# Patient Record
Sex: Female | Born: 1940
Health system: Southern US, Community
[De-identification: ages and names within clinical notes are randomized; demographics above are authoritative.]

## PROBLEM LIST (undated history)

## (undated) DIAGNOSIS — I2510 Atherosclerotic heart disease of native coronary artery without angina pectoris: Secondary | ICD-10-CM

## (undated) DIAGNOSIS — F329 Major depressive disorder, single episode, unspecified: Secondary | ICD-10-CM

## (undated) DIAGNOSIS — F32A Depression, unspecified: Secondary | ICD-10-CM

## (undated) DIAGNOSIS — H532 Diplopia: Secondary | ICD-10-CM

## (undated) DIAGNOSIS — K219 Gastro-esophageal reflux disease without esophagitis: Secondary | ICD-10-CM

## (undated) DIAGNOSIS — K200 Eosinophilic esophagitis: Secondary | ICD-10-CM

## (undated) DIAGNOSIS — N189 Chronic kidney disease, unspecified: Secondary | ICD-10-CM

## (undated) DIAGNOSIS — I639 Cerebral infarction, unspecified: Secondary | ICD-10-CM

## (undated) DIAGNOSIS — M1990 Unspecified osteoarthritis, unspecified site: Secondary | ICD-10-CM

## (undated) DIAGNOSIS — Z8619 Personal history of other infectious and parasitic diseases: Secondary | ICD-10-CM

## (undated) DIAGNOSIS — H9209 Otalgia, unspecified ear: Secondary | ICD-10-CM

## (undated) DIAGNOSIS — R32 Unspecified urinary incontinence: Secondary | ICD-10-CM

## (undated) DIAGNOSIS — H348112 Central retinal vein occlusion, right eye, stable: Secondary | ICD-10-CM

## (undated) DIAGNOSIS — E785 Hyperlipidemia, unspecified: Secondary | ICD-10-CM

## (undated) DIAGNOSIS — I10 Essential (primary) hypertension: Secondary | ICD-10-CM

## (undated) HISTORY — PX: ESOPHAGOGASTRODUODENOSCOPY: SHX1529

## (undated) HISTORY — DX: Depression, unspecified: F32.A

## (undated) HISTORY — DX: Major depressive disorder, single episode, unspecified: F32.9

## (undated) HISTORY — DX: Personal history of other infectious and parasitic diseases: Z86.19

## (undated) HISTORY — PX: OTHER SURGICAL HISTORY: SHX169

## (undated) HISTORY — DX: Otalgia, unspecified ear: H92.09

## (undated) HISTORY — PX: CAROTID STENT: SHX1301

## (undated) HISTORY — PX: ABDOMINAL HYSTERECTOMY: SHX81

## (undated) HISTORY — DX: Cerebral infarction, unspecified: I63.9

---

## 1999-04-23 DIAGNOSIS — I639 Cerebral infarction, unspecified: Secondary | ICD-10-CM

## 1999-04-23 HISTORY — DX: Cerebral infarction, unspecified: I63.9

## 2012-03-13 ENCOUNTER — Encounter (INDEPENDENT_AMBULATORY_CARE_PROVIDER_SITE_OTHER): Payer: Medicare Other | Admitting: Ophthalmology

## 2012-03-13 DIAGNOSIS — I10 Essential (primary) hypertension: Secondary | ICD-10-CM

## 2012-03-13 DIAGNOSIS — H35039 Hypertensive retinopathy, unspecified eye: Secondary | ICD-10-CM

## 2012-03-13 DIAGNOSIS — H348192 Central retinal vein occlusion, unspecified eye, stable: Secondary | ICD-10-CM

## 2012-03-13 DIAGNOSIS — H43819 Vitreous degeneration, unspecified eye: Secondary | ICD-10-CM

## 2012-04-07 ENCOUNTER — Ambulatory Visit (HOSPITAL_BASED_OUTPATIENT_CLINIC_OR_DEPARTMENT_OTHER): Payer: Medicare Other | Attending: Nurse Practitioner | Admitting: Radiology

## 2012-04-07 VITALS — Ht 67.0 in | Wt 168.0 lb

## 2012-04-07 DIAGNOSIS — G4710 Hypersomnia, unspecified: Secondary | ICD-10-CM | POA: Insufficient documentation

## 2012-04-07 DIAGNOSIS — G4733 Obstructive sleep apnea (adult) (pediatric): Secondary | ICD-10-CM

## 2012-04-11 DIAGNOSIS — G4710 Hypersomnia, unspecified: Secondary | ICD-10-CM

## 2012-04-11 DIAGNOSIS — G4730 Sleep apnea, unspecified: Secondary | ICD-10-CM

## 2012-04-11 NOTE — Procedures (Signed)
NAME:  ABBAGAIL, SCAFF                  ACCOUNT NO.:  [id number]  MEDICAL RECORD NO.:  [id number]          PATIENT TYPE:  OUT  LOCATION:  SLEEP CENTER                 FACILITY:  [facility]  PHYSICIAN:  Clinton D. Maple Hudson, MD, FCCP, FACPDATE OF BIRTH:  11-Dec[birth year]1938  DATE OF STUDY:  04/07/2012                           NOCTURNAL POLYSOMNOGRAM  REFERRING PHYSICIAN:  HAHN-KETTER  REFERRING PHYSICIAN:  Cathlyn Parsons, NP  INDICATION FOR STUDY:  Hypersomnia with sleep apnea.  EPWORTH SLEEPINESS SCORE:  10/24.  BMI 26.3, weight 168 pounds, height 67 inches, neck 14 inches.  MEDICATIONS:  Home medications are charted and reviewed.  A baseline diagnostic NPSG was done elsewhere on January 05, 2012, recording an AHI of 9.5 per hour.  Body weight was 169 pounds for that study.  CPAP titration is now requested.  SLEEP ARCHITECTURE:  Total sleep time 308 minutes with sleep efficiency 64.7%.  Stage I was 2.3%, stage II 95.3%, stage III 0.8%, REM 1.6% of total sleep time.  Sleep latency 165 minutes.  REM latency 253.5 minutes, awake after sleep onset 4 minutes.  Arousal index 24.2. Bedtime medication:  None.  RESPIRATORY DATA:  CPAP titration protocol.  CPAP was titrated to 7 CWP, AHI 40 per hour, reflecting obstructive and central events.  Because of frequent central apneas, further titration was with bilevel to a final inspiratory pressure of 18 and expiratory pressure of 14 CWP, AHI 5.3 per hour.  She wore a small ResMed Quattro FX full-face mask with heated humidifier.  OXYGEN DATA:  Snoring was prevented by CPAP/BIPAP and mean oxygen saturation held 95.5% on room air.  CARDIAC DATA:  Normal sinus rhythm.  MOVEMENT/PARASOMNIA:  No significant movement disturbance.  Bathroom x1.  IMPRESSION/RECOMMENDATION: 1. CPAP provided inadequate control of central events and was changed     to bilevel (BiPAP) with final titration to inspiratory 18 CWP and     expiratory 14 CWP.  This provided AHI 5.3  per hour and prevented     snoring.  She wore a small ResMed Quattro FX full-face mask with     heated humidifier. 2. Baseline NPSG on January 05, 2012 elsewhere had recorded AHI 9.5     per hour.  Body weight was 169 pounds for that study.     Clinton D. Maple Hudson, MD, [facility], FACP Diplomate, American Board of Sleep Medicine    CDY/MEDQ  D:  04/11/2012 12:18:22  T:  04/11/2012 16:10:96  Job:  [id number]

## 2012-04-24 ENCOUNTER — Encounter (INDEPENDENT_AMBULATORY_CARE_PROVIDER_SITE_OTHER): Payer: Medicare PPO | Admitting: Ophthalmology

## 2012-04-24 DIAGNOSIS — H43819 Vitreous degeneration, unspecified eye: Secondary | ICD-10-CM

## 2012-04-24 DIAGNOSIS — I10 Essential (primary) hypertension: Secondary | ICD-10-CM

## 2012-04-24 DIAGNOSIS — H35039 Hypertensive retinopathy, unspecified eye: Secondary | ICD-10-CM

## 2012-04-24 DIAGNOSIS — H348192 Central retinal vein occlusion, unspecified eye, stable: Secondary | ICD-10-CM

## 2012-05-12 ENCOUNTER — Encounter: Payer: Self-pay | Admitting: Physical Medicine & Rehabilitation

## 2012-05-22 ENCOUNTER — Encounter (INDEPENDENT_AMBULATORY_CARE_PROVIDER_SITE_OTHER): Payer: Medicare PPO | Admitting: Ophthalmology

## 2012-05-22 DIAGNOSIS — I10 Essential (primary) hypertension: Secondary | ICD-10-CM

## 2012-05-22 DIAGNOSIS — H348192 Central retinal vein occlusion, unspecified eye, stable: Secondary | ICD-10-CM

## 2012-05-22 DIAGNOSIS — H43819 Vitreous degeneration, unspecified eye: Secondary | ICD-10-CM

## 2012-05-22 DIAGNOSIS — H35039 Hypertensive retinopathy, unspecified eye: Secondary | ICD-10-CM

## 2012-06-01 ENCOUNTER — Encounter: Payer: Medicare PPO | Attending: Physical Medicine & Rehabilitation | Admitting: Physical Medicine & Rehabilitation

## 2012-06-01 ENCOUNTER — Encounter: Payer: Self-pay | Admitting: Physical Medicine & Rehabilitation

## 2012-06-01 VITALS — BP 88/46 | HR 56 | Resp 14 | Ht 66.0 in | Wt 173.0 lb

## 2012-06-01 DIAGNOSIS — G4700 Insomnia, unspecified: Secondary | ICD-10-CM | POA: Insufficient documentation

## 2012-06-01 DIAGNOSIS — I69320 Aphasia following cerebral infarction: Secondary | ICD-10-CM

## 2012-06-01 DIAGNOSIS — M25569 Pain in unspecified knee: Secondary | ICD-10-CM | POA: Insufficient documentation

## 2012-06-01 DIAGNOSIS — K5900 Constipation, unspecified: Secondary | ICD-10-CM | POA: Insufficient documentation

## 2012-06-01 DIAGNOSIS — I6789 Other cerebrovascular disease: Secondary | ICD-10-CM

## 2012-06-01 DIAGNOSIS — IMO0002 Reserved for concepts with insufficient information to code with codable children: Secondary | ICD-10-CM

## 2012-06-01 DIAGNOSIS — M545 Low back pain, unspecified: Secondary | ICD-10-CM | POA: Insufficient documentation

## 2012-06-01 DIAGNOSIS — I69959 Hemiplegia and hemiparesis following unspecified cerebrovascular disease affecting unspecified side: Secondary | ICD-10-CM | POA: Insufficient documentation

## 2012-06-01 DIAGNOSIS — G8929 Other chronic pain: Secondary | ICD-10-CM | POA: Insufficient documentation

## 2012-06-01 DIAGNOSIS — I6330 Cerebral infarction due to thrombosis of unspecified cerebral artery: Secondary | ICD-10-CM | POA: Insufficient documentation

## 2012-06-01 DIAGNOSIS — M1711 Unilateral primary osteoarthritis, right knee: Secondary | ICD-10-CM

## 2012-06-01 DIAGNOSIS — M24419 Recurrent dislocation, unspecified shoulder: Secondary | ICD-10-CM | POA: Insufficient documentation

## 2012-06-01 DIAGNOSIS — I10 Essential (primary) hypertension: Secondary | ICD-10-CM | POA: Insufficient documentation

## 2012-06-01 DIAGNOSIS — G4733 Obstructive sleep apnea (adult) (pediatric): Secondary | ICD-10-CM | POA: Insufficient documentation

## 2012-06-01 DIAGNOSIS — M1710 Unilateral primary osteoarthritis, unspecified knee: Secondary | ICD-10-CM

## 2012-06-01 DIAGNOSIS — M7500 Adhesive capsulitis of unspecified shoulder: Secondary | ICD-10-CM | POA: Insufficient documentation

## 2012-06-01 DIAGNOSIS — I69920 Aphasia following unspecified cerebrovascular disease: Secondary | ICD-10-CM | POA: Insufficient documentation

## 2012-06-01 DIAGNOSIS — M25519 Pain in unspecified shoulder: Secondary | ICD-10-CM | POA: Insufficient documentation

## 2012-06-01 DIAGNOSIS — Z5181 Encounter for therapeutic drug level monitoring: Secondary | ICD-10-CM

## 2012-06-01 DIAGNOSIS — G8110 Spastic hemiplegia affecting unspecified side: Secondary | ICD-10-CM

## 2012-06-01 MED ORDER — OXYCODONE HCL ER 30 MG PO T12A: 30.0000 mg | EXTENDED_RELEASE_TABLET | Freq: Two times a day (BID) | ORAL | Status: DC

## 2012-06-01 NOTE — Progress Notes (Addendum)
Subjective:    Patient ID: Kristin [name], female    DOB: 12/23/1940, 72 y.o.   MRN: [id number]  HPI  This is an initial visit for Kristin [name] today. She is a pleasant 72 WF who suffered a left MCA stroke in 2001. She was referred to this clinic by her son who is a Programmer, multimedia. She recently moved to the [place] area after having spent her entire life in the [place] area.   She reports increased pain in her right knee over the last several years since the stroke. She was seen at [facility] in [place]. She was place on lyrica and oxycodone which helped to an extent. She is currently on oxycontin 41m q12. Family doesn't recall that xrays were performed recently. She has a right AFO which is approximately 74 years old. Lyrica is on board 150mg  TID for neuropathic pain in the right leg. She has had threrapy remotely. Apparently botox injections were performed at some point.   Kristin [name] also complains of low back pain when she sits for longer periods of time or , to a lesser extent, when she walks longer distances. She uses a 3 wheeled walker for balance. Kristin [name]/Kristin [name] report that she and her Kristin [name] have been fairly active up until recently with exercise and activities outside the home. They had even been involved in water exercise/aerobics until the fall.  She reports some intermittent pain in the right shoulder which tends to bother her the longer she's up walking or sitting. She does not wear a splint.   Constipation is another family mentioned.  She sometimes goes a few days between movements. Her stool is often hard. She is only using laxatives on a PRN basis because she doesn't want to add to her med list. She does drink a lot of water, tries to stay active etc.   She and her Kristin [name] are in [place] at [employer].   Kristin [name] reports that she often wakes up in the middle of the night. Usually it is her bladder which awakens her. Sometimes he awakens her when he's up. He reports that on the whole she  has been more anxious since her stroke, and he feels that it's her anxiety which prevents her from falling back asleep.     Pain Inventory Average Pain 3 Pain Right Now 5 My pain is intermittent, dull, stabbing and aching  In the last 24 hours, has pain interfered with the following? General activity 4 Relation with others 1 Enjoyment of life 3 What TIME of day is your pain at its worst? evening Sleep (in general) Poor  Pain is worse with: walking, sitting, standing and exercise Pain improves with: rest and medication Relief from Meds: 6  Mobility use a walker ability to climb steps?  no do you drive?  no transfers alone  Function disabled: date disabled 04/1999 I need assistance with the following:  meal prep, household duties and shopping  Neuro/Psych weakness trouble walking dizziness confusion depression  Prior Studies x-rays CT/MRI  Physicians involved in your care Primary care [facility]   History reviewed. No pertinent family history. History   Social History  . Marital Status: Married    Spouse Name: N/A    Number of Children: N/A  . Years of Education: N/A   Social History Main Topics  . Smoking status: Never Smoker   . Smokeless tobacco: None  . Alcohol Use: None  . Drug Use: None  . Sexually Active: None  Other Topics Concern  . None   Social History Narrative  . None   Past Surgical History  Procedure Laterality Date  . Abdominal hysterectomy    . Carotid stent    . Lasix     Past Medical History  Diagnosis Date  . Stroke   . Hypertension   . Heart block     Stent   BP 88/46  Pulse 56  Resp 14  Ht 5\' 6"  (1.676 m)  Wt 173 lb (78.472 kg)  BMI 27.94 kg/m2  SpO2 95%     Review of Systems  Constitutional: Positive for unexpected weight change.  Cardiovascular: Positive for leg swelling.  Gastrointestinal: Positive for nausea and constipation.  Musculoskeletal: Positive  for back pain and gait problem.  Neurological: Positive for weakness.  Hematological: Bruises/bleeds easily.  Psychiatric/Behavioral: Positive for confusion and dysphoric mood.  All other systems reviewed and are negative.       Objective:   Physical Exam  General: Alert , No apparent distress HEENT: Head is normocephalic, atraumatic, PERRLA, EOMI, sclera anicteric, oral mucosa pink and moist, dentition intact, ext ear canals clear,  Neck: Supple without JVD or lymphadenopathy Heart: Reg rate and rhythm. No murmurs rubs or gallops Chest: CTA bilaterally without wheezes, rales, or rhonchi; no distress Abdomen: Soft, non-tender, non-distended, bowel sounds positive. Extremities: No clubbing, cyanosis, or edema. Pulses are 2+ Skin: Clean and intact without signs of breakdown Neuro: Pt with diplopia when testing EOM but no gross asymmetry was seen. She has no other obvious CN deficits. She has expressive greater than receptive aphasia, occasionally uttering an appropriate "yes" or "no" between repetitive sound she tends to make. She also can occasionally speak in short sentences when she tries to sing. RUE strength is grossly 2 to 2+ with the hand limited more by spasticity. RLE is 3-4 with HF, KE, 2 with ADF, APF. She has a flexor spasticity pattern in the RUE with Ashworth scores: 1+ pecs, trace biceps, 3 WF and FF. 1-2 right foot PF and Inverters.DTR's on the right are 3+. Clonus of 2 beats seen at the ankle. She does have diminished pain sense on the right side but IS able to sense pain. She ambulated for me and tends to circumduct and laterally rotate the right leg for clearance. Her heel does not sit completely into the bottom of the AFO Musculoskeletal: she has lateral joint line pain at the right knee with palpation. Mild pain there as well with McMurray's testing. Minimal pain with valgus and varus stress. No instability was seen. Minimal swelling noted around the knee. Low back was mildy  tender to palpaiton. Right shoulder was tight abduction and IR/ER. RTC manuevers were equivocal. She has a 1/2 sublux of the humeral head.  Psych: Pt's affect is appropriate. Pt is cooperative        Assessment & Plan:  1. Hx of left MCA infarct with spastic right hemiparesis, expressive greater than receptive aphasia 2. Chronic right knee pain which appears osteoarthritic and is related to her gait pattern. 3. Chronic low back pain, again, likely related to gait pattern. 4. Insomnia, anxiety 5. Constipation 6. Right shoulder pain related to chronic subluxation, mild adhesive capsulitis, chronic spasticity 7. OSA.   Plan: 1. Refilled her oxycontin 30mg  q12 and a CSA was signed. My goal would be to wean and eventually discontinue this medication. The patient would prefer this. 2. Will set her up for botox injections to the right gastrocs, tibialis posterior, and right wrist  and finger flexors with a total of 300u botox to start---will follow these up with outpt PT and OT to work on ROM, posture, functional use of the affected limbs, and technique with gait. I also think she would do better with a hemiwalker or pronged cane rather than her current contraption. 3. Recommended scheduled senokot s, 2 tabs at bedtime 4. Reviewed sleep study report which indicates that BIPAP at IP of 18 and EP of 14 would be useful. She will follow up with Dr. Jarold Motto in this regard. She only has a CPAP unit at home. 5. Will send for xrays of right knee. Based on her exam, she probably has a mild meniscal injury. Consider knee injection as well. Ultimately, she needs to improve her gait technique to avoid further wear and tear. 5. I will see her back in a 3-4 weeks for injections. 45 minutes of face to face patient care time were spent during this visit. All questions were encouraged and answered.

## 2012-06-01 NOTE — Progress Notes (Deleted)
  Subjective:    Patient ID: Kristin [name], female    DOB: March 02, 1941, 72 y.o.   MRN: [id number]  HPI    Review of Systems     Objective:   Physical Exam        Assessment & Plan:

## 2012-06-01 NOTE — Patient Instructions (Signed)
CALL ME WITH QUESTIONS 

## 2012-06-02 ENCOUNTER — Ambulatory Visit
Admission: RE | Admit: 2012-06-02 | Discharge: 2012-06-02 | Disposition: A | Discharge status: Home / Self Care | Payer: Medicare PPO | Source: Ambulatory Visit | Attending: Physical Medicine & Rehabilitation | Admitting: Physical Medicine & Rehabilitation

## 2012-06-02 DIAGNOSIS — M1711 Unilateral primary osteoarthritis, right knee: Secondary | ICD-10-CM

## 2012-06-03 ENCOUNTER — Telehealth: Payer: Self-pay | Admitting: Physical Medicine & Rehabilitation

## 2012-06-03 MED ORDER — DICLOFENAC SODIUM 1 % TD GEL: 1.0000 "application " | Freq: Four times a day (QID) | TRANSDERMAL | Status: DC

## 2012-06-03 NOTE — Telephone Encounter (Signed)
Patients husband aware of xray results and voltaren sent to [facility] pharmacy.

## 2012-06-03 NOTE — Telephone Encounter (Signed)
On XRAY, There is arthritis in her lateral right knee as I suspected. We can discuss mgt further at follow up. In the meantime, I would recommend voltaren gel TID to the right knee if she would like, #3 tubes, #3RF

## 2012-06-15 NOTE — Progress Notes (Signed)
No problem  Need to have my wife be contact person as father-in-law having memory issues  My wife called your office to work on that  Thanks again

## 2012-06-23 ENCOUNTER — Telehealth: Payer: Self-pay

## 2012-06-23 NOTE — Telephone Encounter (Signed)
[name] called to follow up on status of botox and occupational therapy referral. Please call.

## 2012-06-25 ENCOUNTER — Encounter (INDEPENDENT_AMBULATORY_CARE_PROVIDER_SITE_OTHER): Payer: Medicare PPO | Admitting: Ophthalmology

## 2012-06-25 ENCOUNTER — Telehealth: Payer: Self-pay | Admitting: Physical Medicine & Rehabilitation

## 2012-06-25 NOTE — Telephone Encounter (Signed)
It's not ideal, but she still could work on home exercises provided by therapy during the "breaks"

## 2012-06-25 NOTE — Telephone Encounter (Signed)
Spoke with Mrs. Leone Payor (daughter-in-law) to schedule patient's Botox injection.  Patient's husband is having surgery soon and patient may be going to live various relatives while he recuperates.  Will there be a significant problem if there are breaks in her therapy after her Botox injection.  Appointment is scheduled for 3/12.14  I could not answer this for her, and she requested a call back from someone clinical to discuss.

## 2012-06-29 NOTE — Telephone Encounter (Signed)
Spoke with [name].  They will plan to have the patient do at-home exercises when she cannot come to therapy, and return around the end of March.

## 2012-07-01 ENCOUNTER — Encounter: Payer: Medicare PPO | Attending: Physical Medicine & Rehabilitation | Admitting: Physical Medicine & Rehabilitation

## 2012-07-01 ENCOUNTER — Telehealth: Payer: Self-pay

## 2012-07-01 ENCOUNTER — Encounter: Payer: Self-pay | Admitting: Physical Medicine & Rehabilitation

## 2012-07-01 ENCOUNTER — Encounter (INDEPENDENT_AMBULATORY_CARE_PROVIDER_SITE_OTHER): Payer: Medicare PPO | Admitting: Ophthalmology

## 2012-07-01 VITALS — BP 99/57 | HR 56 | Resp 14 | Ht 66.0 in | Wt 177.0 lb

## 2012-07-01 DIAGNOSIS — M545 Low back pain, unspecified: Secondary | ICD-10-CM | POA: Insufficient documentation

## 2012-07-01 DIAGNOSIS — I6330 Cerebral infarction due to thrombosis of unspecified cerebral artery: Secondary | ICD-10-CM | POA: Insufficient documentation

## 2012-07-01 DIAGNOSIS — G8110 Spastic hemiplegia affecting unspecified side: Secondary | ICD-10-CM

## 2012-07-01 DIAGNOSIS — M1711 Unilateral primary osteoarthritis, right knee: Secondary | ICD-10-CM

## 2012-07-01 DIAGNOSIS — M1710 Unilateral primary osteoarthritis, unspecified knee: Secondary | ICD-10-CM

## 2012-07-01 DIAGNOSIS — IMO0002 Reserved for concepts with insufficient information to code with codable children: Secondary | ICD-10-CM

## 2012-07-01 MED ORDER — OXYCODONE HCL ER 20 MG PO T12A: 20.0000 mg | EXTENDED_RELEASE_TABLET | Freq: Two times a day (BID) | ORAL | Status: DC

## 2012-07-01 NOTE — Progress Notes (Signed)
Botox Injection for spasticity using needle EMG guidance  Dilution: 100 Units/ml Indication: Severe spasticity which interferes with ADL,mobility and/or  hygiene and is unresponsive to medication management and other conservative care Informed consent was obtained after describing risks and benefits of the procedure with the patient. This includes bleeding, bruising, infection, excessive weakness, or medication side effects. A REMS form is on file and signed. Needle: 36mm,26g needle electrode  Number of units per muscle: (right upper ext)  FDS 125 FDP 100 FPL75  All injections were done after obtaining appropriate EMG activity and after negative drawback for blood. The patient tolerated the procedure well. Post procedure instructions were given. A followup appointment was made.   Additionally, as she's done well with the voltaren gel, we decreased her oxycontin to 20mg  q12 hrs. A new rx was provided today. I made a referral as well to [facility] to start ROM/spasticity therapies for RUE.   I'll see her back here in about a month. All questions were encouraged and answered.

## 2012-07-01 NOTE — Patient Instructions (Signed)
CALL ME WITH ANY PROBLEMS OR QUESTIONS (#[phone]).  HAVE A GOOD DAY  

## 2012-07-01 NOTE — Telephone Encounter (Signed)
[facility] needed additional codes to process urine drug screen.  Codes given.

## 2012-07-02 ENCOUNTER — Encounter (INDEPENDENT_AMBULATORY_CARE_PROVIDER_SITE_OTHER): Payer: Medicare PPO | Admitting: Ophthalmology

## 2012-07-02 DIAGNOSIS — H43819 Vitreous degeneration, unspecified eye: Secondary | ICD-10-CM

## 2012-07-02 DIAGNOSIS — H348192 Central retinal vein occlusion, unspecified eye, stable: Secondary | ICD-10-CM

## 2012-07-02 DIAGNOSIS — I10 Essential (primary) hypertension: Secondary | ICD-10-CM

## 2012-07-02 DIAGNOSIS — H35039 Hypertensive retinopathy, unspecified eye: Secondary | ICD-10-CM

## 2012-07-07 ENCOUNTER — Ambulatory Visit: Payer: Medicare PPO | Admitting: Occupational Therapy

## 2012-07-07 ENCOUNTER — Ambulatory Visit: Payer: Medicare PPO | Attending: Physical Medicine & Rehabilitation | Admitting: Occupational Therapy

## 2012-07-07 DIAGNOSIS — M2560 Stiffness of unspecified joint, not elsewhere classified: Secondary | ICD-10-CM | POA: Insufficient documentation

## 2012-07-07 DIAGNOSIS — M6281 Muscle weakness (generalized): Secondary | ICD-10-CM | POA: Insufficient documentation

## 2012-07-07 DIAGNOSIS — M629 Disorder of muscle, unspecified: Secondary | ICD-10-CM | POA: Insufficient documentation

## 2012-07-07 DIAGNOSIS — M2420 Disorder of ligament, unspecified site: Secondary | ICD-10-CM | POA: Insufficient documentation

## 2012-07-07 DIAGNOSIS — IMO0001 Reserved for inherently not codable concepts without codable children: Secondary | ICD-10-CM | POA: Insufficient documentation

## 2012-07-07 DIAGNOSIS — I69959 Hemiplegia and hemiparesis following unspecified cerebrovascular disease affecting unspecified side: Secondary | ICD-10-CM | POA: Insufficient documentation

## 2012-07-22 ENCOUNTER — Ambulatory Visit: Payer: Medicare PPO | Attending: Physical Medicine & Rehabilitation | Admitting: Occupational Therapy

## 2012-07-22 DIAGNOSIS — M2420 Disorder of ligament, unspecified site: Secondary | ICD-10-CM | POA: Insufficient documentation

## 2012-07-22 DIAGNOSIS — M2560 Stiffness of unspecified joint, not elsewhere classified: Secondary | ICD-10-CM | POA: Insufficient documentation

## 2012-07-22 DIAGNOSIS — M629 Disorder of muscle, unspecified: Secondary | ICD-10-CM | POA: Insufficient documentation

## 2012-07-22 DIAGNOSIS — M6281 Muscle weakness (generalized): Secondary | ICD-10-CM | POA: Insufficient documentation

## 2012-07-22 DIAGNOSIS — IMO0001 Reserved for inherently not codable concepts without codable children: Secondary | ICD-10-CM | POA: Insufficient documentation

## 2012-07-28 ENCOUNTER — Telehealth: Payer: Self-pay

## 2012-07-28 DIAGNOSIS — M545 Low back pain, unspecified: Secondary | ICD-10-CM

## 2012-07-28 DIAGNOSIS — G8110 Spastic hemiplegia affecting unspecified side: Secondary | ICD-10-CM

## 2012-07-28 NOTE — Telephone Encounter (Signed)
Patients daughter in law [name] called regarding refill on oxycodone 20mg  bid.  Please advise.

## 2012-07-29 ENCOUNTER — Ambulatory Visit: Payer: Medicare PPO | Admitting: Occupational Therapy

## 2012-07-29 MED ORDER — OXYCODONE HCL ER 20 MG PO T12A: 20.0000 mg | EXTENDED_RELEASE_TABLET | Freq: Two times a day (BID) | ORAL | Status: DC

## 2012-07-29 NOTE — Telephone Encounter (Signed)
Kristin [name] aware script will be ready for pick up 07/30/12 in the afternoon.

## 2012-07-31 ENCOUNTER — Encounter (INDEPENDENT_AMBULATORY_CARE_PROVIDER_SITE_OTHER): Payer: Medicare HMO | Admitting: Ophthalmology

## 2012-07-31 ENCOUNTER — Ambulatory Visit: Payer: Medicare PPO | Admitting: Occupational Therapy

## 2012-07-31 DIAGNOSIS — I10 Essential (primary) hypertension: Secondary | ICD-10-CM

## 2012-07-31 DIAGNOSIS — H35039 Hypertensive retinopathy, unspecified eye: Secondary | ICD-10-CM

## 2012-07-31 DIAGNOSIS — H348192 Central retinal vein occlusion, unspecified eye, stable: Secondary | ICD-10-CM

## 2012-07-31 DIAGNOSIS — H43819 Vitreous degeneration, unspecified eye: Secondary | ICD-10-CM

## 2012-08-03 ENCOUNTER — Ambulatory Visit: Payer: Medicare PPO | Admitting: Occupational Therapy

## 2012-08-05 ENCOUNTER — Ambulatory Visit: Payer: Medicare PPO | Admitting: Occupational Therapy

## 2012-08-06 ENCOUNTER — Ambulatory Visit: Payer: Medicare PPO | Admitting: Occupational Therapy

## 2012-08-17 ENCOUNTER — Encounter: Payer: Self-pay | Admitting: Physical Medicine & Rehabilitation

## 2012-08-17 ENCOUNTER — Encounter: Payer: Medicare PPO | Attending: Physical Medicine & Rehabilitation | Admitting: Physical Medicine & Rehabilitation

## 2012-08-17 VITALS — BP 107/73 | HR 59 | Resp 16 | Ht 67.0 in | Wt 169.0 lb

## 2012-08-17 DIAGNOSIS — I69959 Hemiplegia and hemiparesis following unspecified cerebrovascular disease affecting unspecified side: Secondary | ICD-10-CM | POA: Insufficient documentation

## 2012-08-17 DIAGNOSIS — M25519 Pain in unspecified shoulder: Secondary | ICD-10-CM | POA: Insufficient documentation

## 2012-08-17 DIAGNOSIS — G4733 Obstructive sleep apnea (adult) (pediatric): Secondary | ICD-10-CM | POA: Insufficient documentation

## 2012-08-17 DIAGNOSIS — S0100XA Unspecified open wound of scalp, initial encounter: Secondary | ICD-10-CM | POA: Insufficient documentation

## 2012-08-17 DIAGNOSIS — M1710 Unilateral primary osteoarthritis, unspecified knee: Secondary | ICD-10-CM

## 2012-08-17 DIAGNOSIS — M7500 Adhesive capsulitis of unspecified shoulder: Secondary | ICD-10-CM | POA: Insufficient documentation

## 2012-08-17 DIAGNOSIS — M545 Low back pain, unspecified: Secondary | ICD-10-CM

## 2012-08-17 DIAGNOSIS — M25569 Pain in unspecified knee: Secondary | ICD-10-CM | POA: Insufficient documentation

## 2012-08-17 DIAGNOSIS — I69920 Aphasia following unspecified cerebrovascular disease: Secondary | ICD-10-CM

## 2012-08-17 DIAGNOSIS — I6330 Cerebral infarction due to thrombosis of unspecified cerebral artery: Secondary | ICD-10-CM

## 2012-08-17 DIAGNOSIS — I10 Essential (primary) hypertension: Secondary | ICD-10-CM | POA: Insufficient documentation

## 2012-08-17 DIAGNOSIS — I69320 Aphasia following cerebral infarction: Secondary | ICD-10-CM

## 2012-08-17 DIAGNOSIS — M24419 Recurrent dislocation, unspecified shoulder: Secondary | ICD-10-CM | POA: Insufficient documentation

## 2012-08-17 DIAGNOSIS — M1711 Unilateral primary osteoarthritis, right knee: Secondary | ICD-10-CM

## 2012-08-17 DIAGNOSIS — G8929 Other chronic pain: Secondary | ICD-10-CM | POA: Insufficient documentation

## 2012-08-17 DIAGNOSIS — G4700 Insomnia, unspecified: Secondary | ICD-10-CM | POA: Insufficient documentation

## 2012-08-17 DIAGNOSIS — IMO0002 Reserved for concepts with insufficient information to code with codable children: Secondary | ICD-10-CM

## 2012-08-17 DIAGNOSIS — K5900 Constipation, unspecified: Secondary | ICD-10-CM | POA: Insufficient documentation

## 2012-08-17 DIAGNOSIS — G8110 Spastic hemiplegia affecting unspecified side: Secondary | ICD-10-CM

## 2012-08-17 MED ORDER — OXYCODONE HCL ER 10 MG PO T12A: 10.0000 mg | EXTENDED_RELEASE_TABLET | Freq: Two times a day (BID) | ORAL | Status: DC

## 2012-08-17 NOTE — Patient Instructions (Signed)
KEEP PRESSURE ON THE WOUND.   DISCUSS WITH DR. PATTERSON REGARDING HOLDING PLAVIX FOR A FEW DAYS.  TRY OXYCONTIN 10MG  EVERY 12 FOR 2 WEEKS THEN ONCE DAILY FOR 2 WEEKS THEN OFF (IF TOLERATED)

## 2012-08-17 NOTE — Progress Notes (Signed)
Subjective:    Patient ID: Kristin [name], female    DOB: [birth year], 72 y.o.   MRN: [id number]  HPI  Mrs. Better is back regarding her spastic right hemiparesis. About a month ago, she fell getting up in the middle of the night and suffered a contusion and laceration to the right fronto-temporal scalp. CT of the head was negative for bleed. No fractures were found.   She had botox injections at her last visit. She went to outpt OT but has completed and returned to a home exercise program.  Pain Inventory Average Pain 5 Pain Right Now 2 My pain is burning and dull  In the last 24 hours, has pain interfered with the following? General activity 2 Relation with others 2 Enjoyment of life 2 What TIME of day is your pain at its worst? evening Sleep (in general) Fair  Pain is worse with: inactivity Pain improves with: n/a Relief from Meds: 8  Mobility use a walker ability to climb steps?  no do you drive?  no transfers alone Do you have any goals in this area?  no  Function disabled: date disabled 1-01 I need assistance with the following:  meal prep, household duties and shopping  Neuro/Psych weakness trouble walking  Prior Studies Any changes since last visit?  yes CT/MRI  Physicians involved in your care Any changes since last visit?  no   History reviewed. No pertinent family history. History   Social History  . Marital Status: Married    Spouse Name: N/A    Number of Children: N/A  . Years of Education: N/A   Social History Main Topics  . Smoking status: Never Smoker   . Smokeless tobacco: None  . Alcohol Use: None  . Drug Use: None  . Sexually Active: None   Other Topics Concern  . None   Social History Narrative  . None   Past Surgical History  Procedure Laterality Date  . Abdominal hysterectomy    . Carotid stent    . Lasix     Past Medical History  Diagnosis Date  . Stroke   . Hypertension   . Heart block     Stent   BP 107/73  Pulse 59   Resp 16  Ht 5\' 7"  (1.702 m)  Wt 169 lb (76.658 kg)  BMI 26.46 kg/m2  SpO2 95%      Review of Systems  Gastrointestinal: Positive for constipation.  Musculoskeletal: Positive for gait problem.  Skin: Positive for wound.  Neurological: Positive for weakness.  All other systems reviewed and are negative.       Objective:   Physical Exam  General: Alert , No apparent distress  HEENT: Head is normocephalic, atraumatic, PERRLA, EOMI, sclera anicteric, oral mucosa pink and moist, dentition intact, ext ear canals clear,  Neck: Supple without JVD or lymphadenopathy  Heart: Reg rate and rhythm. No murmurs rubs or gallops  Chest: CTA bilaterally without wheezes, rales, or rhonchi; no distress  Abdomen: Soft, non-tender, non-distended, bowel sounds positive.  Extremities: No clubbing, cyanosis, or edema. Pulses are 2+  Skin: Quarter sized open wound in the right temporal region. Area appears clean, but there is substantial serosanguinous drainage. Crusted blood in hair surrounds the wound.  Neuro: Pt with diplopia when testing EOM but no gross asymmetry was seen. She has no other obvious CN deficits. She has expressive greater than receptive aphasia, occasionally uttering an appropriate "yes" or "no" between repetitive sound she tends to make. She also can  occasionally speak in short sentences when she tries to sing. RUE strength is grossly 2 to 2+ with the hand limited more by spasticity. RLE is 3-4 with HF, KE, 2 with ADF, APF. She has a flexor spasticity pattern in the RUE with Ashworth scores: 1 pecs, trace biceps, tr to 1 right wrist and FF. 1- to 1+ right foot PF and Inverters.DTR's on the right are 3+.  . She does have diminished pain sense on the right side but IS able to sense pain. She ambulated for me and tends to circumduct and laterally rotate the right leg for clearance. Her heel does not sit completely into the bottom of the AFO  Musculoskeletal: she has lateral joint line pain  at the right knee with palpation. Mild pain there as well with McMurray's testing.   Psych: Pt's affect is appropriate. Pt is cooperative  Assessment & Plan:   1. Hx of left MCA infarct with spastic right hemiparesis, expressive greater than receptive aphasia  2. Chronic right knee pain which appears osteoarthritic and is related to her gait pattern.  3. Chronic low back pain, again, likely related to gait pattern.  4. Insomnia, anxiety  5. Constipation 6. Right shoulder pain related to chronic subluxation, mild adhesive capsulitis, chronic spasticity  7. OSA.  8. Right scalp wound after fal.  Plan:  1. Changed oxycontin to 10mg  q12 and a CSA was signed. She may potentially wean off from here over the next month.  2. Contine with HEP and splinting for RUE. She has made nice gains with her tone. 3. Again, recommended scheduled senokot s, 2 tabs at bedtime  4. Applied a pressure dressing (as best as we could) to right scalp wound. In my opinion, her plavix needs to be held and a pressure dressing needs to be applied for the next 24 to 48 hours. They will follow up with Dr. Jarold Motto in this regard..  5. I will in one month for follow up. 30 minutes of face to face patient care time were spent during this visit. All questions were encouraged and answered.

## 2012-09-15 ENCOUNTER — Telehealth: Payer: Self-pay | Admitting: *Deleted

## 2012-09-15 MED ORDER — OXYCODONE HCL ER 10 MG PO T12A: 10.0000 mg | EXTENDED_RELEASE_TABLET | Freq: Two times a day (BID) | ORAL | Status: DC

## 2012-09-15 NOTE — Telephone Encounter (Signed)
rx printed for Dr Riley Kill to sign. Appt moved to 09/30/12

## 2012-09-16 ENCOUNTER — Encounter (INDEPENDENT_AMBULATORY_CARE_PROVIDER_SITE_OTHER): Payer: Medicare HMO | Admitting: Ophthalmology

## 2012-09-16 DIAGNOSIS — H348192 Central retinal vein occlusion, unspecified eye, stable: Secondary | ICD-10-CM

## 2012-09-16 DIAGNOSIS — I10 Essential (primary) hypertension: Secondary | ICD-10-CM

## 2012-09-16 DIAGNOSIS — H43819 Vitreous degeneration, unspecified eye: Secondary | ICD-10-CM

## 2012-09-16 DIAGNOSIS — H35039 Hypertensive retinopathy, unspecified eye: Secondary | ICD-10-CM

## 2012-09-16 MED ORDER — OXYCODONE HCL ER 10 MG PO T12A: 10.0000 mg | EXTENDED_RELEASE_TABLET | Freq: Two times a day (BID) | ORAL | Status: DC

## 2012-09-16 NOTE — Telephone Encounter (Signed)
Patient son aware script is ready for pick up.

## 2012-09-18 ENCOUNTER — Encounter: Payer: Medicare PPO | Admitting: Physical Medicine & Rehabilitation

## 2012-09-30 ENCOUNTER — Encounter: Payer: Medicare PPO | Attending: Physical Medicine & Rehabilitation | Admitting: Physical Medicine & Rehabilitation

## 2012-09-30 ENCOUNTER — Encounter: Payer: Self-pay | Admitting: Physical Medicine & Rehabilitation

## 2012-09-30 DIAGNOSIS — M1710 Unilateral primary osteoarthritis, unspecified knee: Secondary | ICD-10-CM

## 2012-09-30 DIAGNOSIS — M1711 Unilateral primary osteoarthritis, right knee: Secondary | ICD-10-CM

## 2012-09-30 DIAGNOSIS — I69920 Aphasia following unspecified cerebrovascular disease: Secondary | ICD-10-CM

## 2012-09-30 DIAGNOSIS — G8110 Spastic hemiplegia affecting unspecified side: Secondary | ICD-10-CM | POA: Insufficient documentation

## 2012-09-30 DIAGNOSIS — M545 Low back pain, unspecified: Secondary | ICD-10-CM

## 2012-09-30 DIAGNOSIS — Z5189 Encounter for other specified aftercare: Secondary | ICD-10-CM

## 2012-09-30 DIAGNOSIS — I69320 Aphasia following cerebral infarction: Secondary | ICD-10-CM

## 2012-09-30 DIAGNOSIS — I6330 Cerebral infarction due to thrombosis of unspecified cerebral artery: Secondary | ICD-10-CM | POA: Insufficient documentation

## 2012-09-30 DIAGNOSIS — IMO0002 Reserved for concepts with insufficient information to code with codable children: Secondary | ICD-10-CM

## 2012-09-30 MED ORDER — OXYCODONE HCL 5 MG PO TABS: 5.0000 mg | ORAL_TABLET | ORAL | Status: DC | PRN

## 2012-09-30 NOTE — Progress Notes (Signed)
Subjective:    Patient ID: Kristin [name], female    DOB: 07-12-1940, 72 y.o.   MRN: [id number]  HPI  Mrs. Clinkenbeard is back regarding her spastic hemiparesis. Her scalp wound healed up nicely over the last few weeks. She came off the plavix for a few days which helped. She continues to keep up with her daily stretching plan and splints.   Her pain has been minimal. She has only been taking 1/2 of an oxycontin every 12 and hasn't noticed much of a difference.   Her stools have become loose with the decreased narcotics.   Overall she is feeling quite well and has no other substantial complaints today.   Pain Inventory Average Pain 2 Pain Right Now 0 My pain is dull and aching  In the last 24 hours, has pain interfered with the following? General activity na Relation with others na Enjoyment of life na What TIME of day is your pain at its worst? na Sleep (in general) NA  Pain is worse with: na Pain improves with: na Relief from Meds: na  Mobility use a walker ability to climb steps?  no do you drive?  no transfers alone  Function disabled: date disabled 1/01 I need assistance with the following:  meal prep, household duties and shopping Do you have any goals in this area?  no  Neuro/Psych trouble walking  Prior Studies Any changes since last visit?  no  Physicians involved in your care Any changes since last visit?  no   History reviewed. No pertinent family history. History   Social History  . Marital Status: Married    Spouse Name: N/A    Number of Children: N/A  . Years of Education: N/A   Social History Main Topics  . Smoking status: Never Smoker   . Smokeless tobacco: None  . Alcohol Use: None  . Drug Use: None  . Sexually Active: None   Other Topics Concern  . None   Social History Narrative  . None   Past Surgical History  Procedure Laterality Date  . Abdominal hysterectomy    . Carotid stent    . Lasix     Past Medical History  Diagnosis  Date  . Stroke   . Hypertension   . Heart block     Stent   BP 105/71  Pulse 67  Resp 16  SpO2 94%     Review of Systems  Gastrointestinal: Positive for diarrhea.  Musculoskeletal: Positive for gait problem.  All other systems reviewed and are negative.       Objective:   Physical Exam General: Alert , No apparent distress  HEENT: Head is normocephalic, atraumatic, PERRLA, EOMI, sclera anicteric, oral mucosa pink and moist, dentition intact, ext ear canals clear,  Neck: Supple without JVD or lymphadenopathy  Heart: Reg rate and rhythm. No murmurs rubs or gallops  Chest: CTA bilaterally without wheezes, rales, or rhonchi; no distress  Abdomen: Soft, non-tender, non-distended, bowel sounds positive.  Extremities: No clubbing, cyanosis, or edema. Pulses are 2+  Skin: Quarter sized open wound in the right temporal region. Area appears clean, but there is substantial serosanguinous drainage. Crusted blood in hair surrounds the wound.  Neuro: Pt with diplopia when testing EOM but no gross asymmetry was seen. She has no other obvious CN deficits. She has expressive greater than receptive aphasia, occasionally uttering an appropriate "yes" or "no" between repetitive sound she tends to make. She also can occasionally speak in short sentences when she  tries to sing. RUE strength is grossly 2 to 2+ with the hand limited more by spasticity. RLE is 3-4 with HF, KE, 2 with ADF, APF. She has a flexor spasticity pattern in the RUE with Ashworth scores: tr to 1 pecs, trace biceps, tr right wrist and FF. 1- to 1+ right foot PF and Inverters.DTR's on the right are 3+.  . She does have diminished pain sense on the right side but IS able to sense pain. She ambulated for me and tends to circumduct and laterally rotate the right leg for clearance. Her heel does not sit completely into the bottom of the AFO  Musculoskeletal: she has lateral joint line pain at the right knee with palpation. Mild pain there  as well with McMurray's testing.  Psych: Pt's affect is appropriate. Pt is cooperative    Assessment & Plan:   1. Hx of left MCA infarct with spastic right hemiparesis, expressive greater than receptive aphasia  2. Chronic right knee pain which appears osteoarthritic and is related to her gait pattern.  3. Chronic low back pain, again, likely related to gait pattern.  4. Insomnia, anxiety  5. Constipation 6. Right shoulder pain related to chronic subluxation, mild adhesive capsulitis, chronic spasticity --much improved! 7. OSA.  8. Right scalp wound after fall--healed   Plan:  1. Rxed oxycodone 5mg   4 prn #30.  I prefer that she use tylenol first for breakthrough pain as her pain is minimal at this point. 2. Contine with HEP and splinting for RUE. She is doing very well with this. 3. She will stop the linzess now that she's off the oxy. She may just be able to maintain with appropriate diet and/or fluids.  4. I will see her back in 4 months for follow up. 15 minutes of face to face patient care time were spent during this visit. All questions were encouraged and answered.

## 2012-09-30 NOTE — Patient Instructions (Signed)
CALL ME WITH ANY PROBLEMS OR QUESTIONS (#[phone]).  HAVE A GOOD DAY  

## 2012-10-01 ENCOUNTER — Other Ambulatory Visit: Payer: Self-pay | Admitting: Internal Medicine

## 2012-10-07 ENCOUNTER — Other Ambulatory Visit: Payer: Self-pay

## 2012-11-04 ENCOUNTER — Encounter (INDEPENDENT_AMBULATORY_CARE_PROVIDER_SITE_OTHER): Payer: Medicare HMO | Admitting: Ophthalmology

## 2012-11-04 DIAGNOSIS — I10 Essential (primary) hypertension: Secondary | ICD-10-CM

## 2012-11-04 DIAGNOSIS — H43819 Vitreous degeneration, unspecified eye: Secondary | ICD-10-CM

## 2012-11-04 DIAGNOSIS — H35039 Hypertensive retinopathy, unspecified eye: Secondary | ICD-10-CM

## 2012-11-04 DIAGNOSIS — H348192 Central retinal vein occlusion, unspecified eye, stable: Secondary | ICD-10-CM

## 2012-11-12 ENCOUNTER — Other Ambulatory Visit: Payer: Self-pay | Admitting: Radiology

## 2012-12-23 ENCOUNTER — Encounter (INDEPENDENT_AMBULATORY_CARE_PROVIDER_SITE_OTHER): Payer: Medicare HMO | Admitting: Ophthalmology

## 2012-12-25 ENCOUNTER — Encounter (INDEPENDENT_AMBULATORY_CARE_PROVIDER_SITE_OTHER): Payer: Medicare HMO | Admitting: Ophthalmology

## 2012-12-25 DIAGNOSIS — H35039 Hypertensive retinopathy, unspecified eye: Secondary | ICD-10-CM

## 2012-12-25 DIAGNOSIS — H43819 Vitreous degeneration, unspecified eye: Secondary | ICD-10-CM

## 2012-12-25 DIAGNOSIS — H348192 Central retinal vein occlusion, unspecified eye, stable: Secondary | ICD-10-CM

## 2012-12-25 DIAGNOSIS — I10 Essential (primary) hypertension: Secondary | ICD-10-CM

## 2012-12-28 ENCOUNTER — Other Ambulatory Visit: Payer: Self-pay | Admitting: Otolaryngology

## 2012-12-28 DIAGNOSIS — H9209 Otalgia, unspecified ear: Secondary | ICD-10-CM

## 2012-12-29 ENCOUNTER — Ambulatory Visit
Admission: RE | Admit: 2012-12-29 | Discharge: 2012-12-29 | Disposition: A | Discharge status: Home / Self Care | Payer: Medicare PPO | Source: Ambulatory Visit | Attending: Otolaryngology | Admitting: Otolaryngology

## 2012-12-29 DIAGNOSIS — H9209 Otalgia, unspecified ear: Secondary | ICD-10-CM

## 2012-12-29 MED ORDER — IOHEXOL 300 MG/ML  SOLN
75.0000 mL | Freq: Once | INTRAMUSCULAR | Status: AC | PRN
  Administered 2012-12-29: 75 mL via INTRAVENOUS

## 2013-01-29 ENCOUNTER — Encounter: Payer: Medicare PPO | Attending: Physical Medicine & Rehabilitation | Admitting: Physical Medicine & Rehabilitation

## 2013-01-29 ENCOUNTER — Encounter: Payer: Self-pay | Admitting: Physical Medicine & Rehabilitation

## 2013-01-29 VITALS — BP 121/80 | HR 65 | Resp 14 | Ht 67.0 in | Wt 179.0 lb

## 2013-01-29 DIAGNOSIS — K5900 Constipation, unspecified: Secondary | ICD-10-CM | POA: Insufficient documentation

## 2013-01-29 DIAGNOSIS — F411 Generalized anxiety disorder: Secondary | ICD-10-CM | POA: Insufficient documentation

## 2013-01-29 DIAGNOSIS — G8929 Other chronic pain: Secondary | ICD-10-CM | POA: Insufficient documentation

## 2013-01-29 DIAGNOSIS — G4733 Obstructive sleep apnea (adult) (pediatric): Secondary | ICD-10-CM | POA: Insufficient documentation

## 2013-01-29 DIAGNOSIS — M7500 Adhesive capsulitis of unspecified shoulder: Secondary | ICD-10-CM | POA: Insufficient documentation

## 2013-01-29 DIAGNOSIS — I69920 Aphasia following unspecified cerebrovascular disease: Secondary | ICD-10-CM | POA: Insufficient documentation

## 2013-01-29 DIAGNOSIS — M1711 Unilateral primary osteoarthritis, right knee: Secondary | ICD-10-CM

## 2013-01-29 DIAGNOSIS — M545 Low back pain, unspecified: Secondary | ICD-10-CM | POA: Insufficient documentation

## 2013-01-29 DIAGNOSIS — I69959 Hemiplegia and hemiparesis following unspecified cerebrovascular disease affecting unspecified side: Secondary | ICD-10-CM | POA: Insufficient documentation

## 2013-01-29 DIAGNOSIS — G4700 Insomnia, unspecified: Secondary | ICD-10-CM | POA: Insufficient documentation

## 2013-01-29 DIAGNOSIS — IMO0002 Reserved for concepts with insufficient information to code with codable children: Secondary | ICD-10-CM

## 2013-01-29 DIAGNOSIS — M1710 Unilateral primary osteoarthritis, unspecified knee: Secondary | ICD-10-CM

## 2013-01-29 DIAGNOSIS — I69320 Aphasia following cerebral infarction: Secondary | ICD-10-CM

## 2013-01-29 DIAGNOSIS — M25569 Pain in unspecified knee: Secondary | ICD-10-CM | POA: Insufficient documentation

## 2013-01-29 DIAGNOSIS — Z79899 Other long term (current) drug therapy: Secondary | ICD-10-CM | POA: Insufficient documentation

## 2013-01-29 DIAGNOSIS — I6330 Cerebral infarction due to thrombosis of unspecified cerebral artery: Secondary | ICD-10-CM

## 2013-01-29 DIAGNOSIS — H9209 Otalgia, unspecified ear: Secondary | ICD-10-CM | POA: Insufficient documentation

## 2013-01-29 NOTE — Progress Notes (Deleted)
  Subjective:    Patient ID: Kristin [name], female    DOB: 31-May-1940, 72 y.o.   MRN: [id number]  HPI    Review of Systems     Objective:   Physical Exam        Assessment & Plan:

## 2013-01-29 NOTE — Patient Instructions (Signed)
LYRICA TAPER:  STOP THE DAY TIME DOSE AND CONTINUE TWICE DAILY FOR 2 WEEKS. THEN---  STOP THE MORNING DOSE AND CONTINUE THE BEDTIME DOSE FOR 4 WEEKS ----THEN STOP

## 2013-01-29 NOTE — Progress Notes (Signed)
Subjective:    Patient ID: Kristin [name], female    DOB: [birth year], 72 y.o.   MRN: [id number]  HPI  Mrs. Carpino is back regarding her spastic right hemiparesis. Her pain has improved. The biggest problem has been ear pain. After much diagnostics ENT has decided she's grinding her teeth. A bite plate is being constructed.   Otherwise she's doing well. She returned with her oxycodone rx as she didn't need it!   Pain Inventory Average Pain 3 Pain Right Now 3 My pain is intermittent, dull, stabbing and aching  In the last 24 hours, has pain interfered with the following? General activity 3 Relation with others 1 Enjoyment of life 1 What TIME of day is your pain at its worst? evening Sleep (in general) Good  Pain is worse with: walking and standing Pain improves with: rest and medication Relief from Meds: 5  Mobility walk with assistance use a walker ability to climb steps?  no do you drive?  no Do you have any goals in this area?  no  Function disabled: date disabled 04-1999 I need assistance with the following:  meal prep, household duties and shopping Do you have any goals in this area?  no  Neuro/Psych trouble walking  Prior Studies Any changes since last visit?  yes CT/MRI  Physicians involved in your care Any changes since last visit?  no   No family history on file. History   Social History  . Marital Status: Married    Spouse Name: N/A    Number of Children: N/A  . Years of Education: N/A   Social History Main Topics  . Smoking status: Never Smoker   . Smokeless tobacco: None  . Alcohol Use: None  . Drug Use: None  . Sexual Activity: None   Other Topics Concern  . None   Social History Narrative  . None   Past Surgical History  Procedure Laterality Date  . Abdominal hysterectomy    . Carotid stent    . Lasix     Past Medical History  Diagnosis Date  . Stroke   . Hypertension   . Heart block     Stent   BP 121/80  Pulse 65  Resp 14   Ht 5\' 7"  (1.702 m)  Wt 179 lb (81.194 kg)  BMI 28.03 kg/m2  SpO2 95%      Review of Systems  Musculoskeletal: Positive for gait problem.  All other systems reviewed and are negative.       Objective:   Physical Exam General: Alert , No apparent distress  HEENT: Head is normocephalic, atraumatic, PERRLA, EOMI, sclera anicteric, oral mucosa pink and moist, dentition intact, ext ear canals clear,  Neck: Supple without JVD or lymphadenopathy  Heart: Reg rate and rhythm. No murmurs rubs or gallops  Chest: CTA bilaterally without wheezes, rales, or rhonchi; no distress  Abdomen: Soft, non-tender, non-distended, bowel sounds positive.  Extremities: No clubbing, cyanosis, or edema. Pulses are 2+  Skin: Quarter sized open wound in the right temporal region. Area appears clean, but there is substantial serosanguinous drainage. Crusted blood in hair surrounds the wound.  Neuro: Pt with diplopia when testing EOM but no gross asymmetry was seen. She has no other obvious CN deficits. She has expressive greater than receptive aphasia, occasionally uttering an appropriate "yes" or "no" between repetitive sound she tends to make. She also can occasionally speak in short sentences when she tries to sing. RUE strength is grossly 2 to 2+ with  the hand limited more by spasticity. RLE is 3-4 with HF, KE, 2 with ADF, APF. She has a flexor spasticity pattern in the RUE with Ashworth scores: tr to 1 pecs, trace biceps, tr right wrist and FF. 1- to 1+ right foot PF and Inverters.DTR's on the right are 3+.  . She does have diminished pain sense on the right side but IS able to sense pain. She ambulated for me and tends to circumduct and laterally rotate the right leg for clearance but lesser so overall.  Musculoskeletal: she has lateral joint line pain at the right knee with palpation. Mild pain there as well with McMurray's testing. Recurvatum with stance. Pain and crepitus at the right TMJ Psych: Pt's affect is  appropriate. Pt is cooperative  Assessment & Plan:   1. Hx of left MCA infarct with spastic right hemiparesis, expressive greater than receptive aphasia  2. Chronic right knee pain which appears osteoarthritic and is related to her gait pattern.  3. Chronic low back pain, again, likely related to gait pattern. --improved 4. Insomnia, anxiety  5. Constipation 6. Right shoulder pain related to chronic subluxation, mild adhesive capsulitis, chronic spasticity --much improved!  7. OSA.   8. Right scalp wound after fall--healed  9. Right TMJ?   Plan:  1. TYLENOL for breakthrough pain. Discussed using a little voltaren gel for her TMJ. Bite plate per ENT. 2. Contine with HEP and splinting for RUE. She is doing very well with this. No botox needed at this time. Discussed appropriate gait mechanics, realistic activity levels, etc.  3. Wean off lyrica over the next 6 weeks. Instructions were provided.  4. I will see her back PRN. Dorothie has done quite nicely. 15 minutes of face to face patient care time were spent during this visit. All questions were encouraged and answered.

## 2013-02-12 ENCOUNTER — Encounter (INDEPENDENT_AMBULATORY_CARE_PROVIDER_SITE_OTHER): Payer: Medicare HMO | Admitting: Ophthalmology

## 2013-02-12 DIAGNOSIS — I10 Essential (primary) hypertension: Secondary | ICD-10-CM

## 2013-02-12 DIAGNOSIS — H348192 Central retinal vein occlusion, unspecified eye, stable: Secondary | ICD-10-CM

## 2013-02-12 DIAGNOSIS — H43819 Vitreous degeneration, unspecified eye: Secondary | ICD-10-CM

## 2013-02-12 DIAGNOSIS — H35039 Hypertensive retinopathy, unspecified eye: Secondary | ICD-10-CM

## 2013-03-02 ENCOUNTER — Other Ambulatory Visit: Payer: Self-pay | Admitting: Physical Medicine & Rehabilitation

## 2013-04-09 ENCOUNTER — Encounter (INDEPENDENT_AMBULATORY_CARE_PROVIDER_SITE_OTHER): Payer: Medicare HMO | Admitting: Ophthalmology

## 2013-04-09 DIAGNOSIS — H43819 Vitreous degeneration, unspecified eye: Secondary | ICD-10-CM

## 2013-04-09 DIAGNOSIS — H348192 Central retinal vein occlusion, unspecified eye, stable: Secondary | ICD-10-CM

## 2013-04-09 DIAGNOSIS — H35039 Hypertensive retinopathy, unspecified eye: Secondary | ICD-10-CM

## 2013-04-09 DIAGNOSIS — I10 Essential (primary) hypertension: Secondary | ICD-10-CM

## 2013-06-04 ENCOUNTER — Encounter (INDEPENDENT_AMBULATORY_CARE_PROVIDER_SITE_OTHER): Payer: Medicare HMO | Admitting: Ophthalmology

## 2013-06-04 DIAGNOSIS — H43819 Vitreous degeneration, unspecified eye: Secondary | ICD-10-CM

## 2013-06-04 DIAGNOSIS — I10 Essential (primary) hypertension: Secondary | ICD-10-CM

## 2013-06-04 DIAGNOSIS — H348192 Central retinal vein occlusion, unspecified eye, stable: Secondary | ICD-10-CM

## 2013-06-04 DIAGNOSIS — H35039 Hypertensive retinopathy, unspecified eye: Secondary | ICD-10-CM

## 2013-06-23 ENCOUNTER — Emergency Department (HOSPITAL_COMMUNITY)
Admission: EM | Admit: 2013-06-23 | Discharge: 2013-06-23 | Disposition: A | Discharge status: Home / Self Care | Payer: Medicare PPO | Attending: Emergency Medicine | Admitting: Emergency Medicine

## 2013-06-23 ENCOUNTER — Encounter (HOSPITAL_COMMUNITY): Payer: Self-pay | Admitting: Emergency Medicine

## 2013-06-23 ENCOUNTER — Emergency Department (HOSPITAL_COMMUNITY): Payer: Medicare PPO

## 2013-06-23 DIAGNOSIS — Z7982 Long term (current) use of aspirin: Secondary | ICD-10-CM | POA: Insufficient documentation

## 2013-06-23 DIAGNOSIS — Z79899 Other long term (current) drug therapy: Secondary | ICD-10-CM | POA: Insufficient documentation

## 2013-06-23 DIAGNOSIS — I69920 Aphasia following unspecified cerebrovascular disease: Secondary | ICD-10-CM | POA: Insufficient documentation

## 2013-06-23 DIAGNOSIS — S0100XA Unspecified open wound of scalp, initial encounter: Secondary | ICD-10-CM | POA: Insufficient documentation

## 2013-06-23 DIAGNOSIS — Z8719 Personal history of other diseases of the digestive system: Secondary | ICD-10-CM | POA: Insufficient documentation

## 2013-06-23 DIAGNOSIS — Y9389 Activity, other specified: Secondary | ICD-10-CM | POA: Insufficient documentation

## 2013-06-23 DIAGNOSIS — W010XXA Fall on same level from slipping, tripping and stumbling without subsequent striking against object, initial encounter: Secondary | ICD-10-CM | POA: Insufficient documentation

## 2013-06-23 DIAGNOSIS — S0990XA Unspecified injury of head, initial encounter: Secondary | ICD-10-CM | POA: Insufficient documentation

## 2013-06-23 DIAGNOSIS — Y9210 Unspecified residential institution as the place of occurrence of the external cause: Secondary | ICD-10-CM | POA: Insufficient documentation

## 2013-06-23 DIAGNOSIS — Z7902 Long term (current) use of antithrombotics/antiplatelets: Secondary | ICD-10-CM | POA: Insufficient documentation

## 2013-06-23 DIAGNOSIS — I10 Essential (primary) hypertension: Secondary | ICD-10-CM | POA: Insufficient documentation

## 2013-06-23 DIAGNOSIS — Z9861 Coronary angioplasty status: Secondary | ICD-10-CM | POA: Insufficient documentation

## 2013-06-23 DIAGNOSIS — W1809XA Striking against other object with subsequent fall, initial encounter: Secondary | ICD-10-CM | POA: Insufficient documentation

## 2013-06-23 DIAGNOSIS — W19XXXA Unspecified fall, initial encounter: Secondary | ICD-10-CM

## 2013-06-23 DIAGNOSIS — I69959 Hemiplegia and hemiparesis following unspecified cerebrovascular disease affecting unspecified side: Secondary | ICD-10-CM | POA: Insufficient documentation

## 2013-06-23 DIAGNOSIS — E785 Hyperlipidemia, unspecified: Secondary | ICD-10-CM | POA: Insufficient documentation

## 2013-06-23 DIAGNOSIS — Z8669 Personal history of other diseases of the nervous system and sense organs: Secondary | ICD-10-CM | POA: Insufficient documentation

## 2013-06-23 DIAGNOSIS — S0101XA Laceration without foreign body of scalp, initial encounter: Secondary | ICD-10-CM

## 2013-06-23 HISTORY — DX: Eosinophilic esophagitis: K20.0

## 2013-06-23 HISTORY — DX: Central retinal vein occlusion, right eye, stable: H34.8112

## 2013-06-23 HISTORY — DX: Diplopia: H53.2

## 2013-06-23 LAB — BASIC METABOLIC PANEL
BUN: 14 mg/dL (ref 6–23)
CO2: 28 mEq/L (ref 19–32)
Calcium: 8.9 mg/dL (ref 8.4–10.5)
Chloride: 105 mEq/L (ref 96–112)
Creatinine, Ser: 0.72 mg/dL (ref 0.50–1.10)
GFR calc Af Amer: >90 mL/min (ref >90)
GFR calc non Af Amer: 82 mL/min — ABNORMAL LOW (ref >90)
Glucose, Bld: 102 mg/dL — ABNORMAL HIGH (ref 70–99)
Potassium: 4 mEq/L (ref 3.7–5.3)
Sodium: 144 mEq/L (ref 137–147)

## 2013-06-23 LAB — CBC WITH DIFFERENTIAL/PLATELET
Basophils Absolute: 0 10*3/uL (ref 0.0–0.1)
Basophils Relative: 0 % (ref 0–1)
Eosinophils Absolute: 0.3 10*3/uL (ref 0.0–0.7)
Eosinophils Relative: 6 % — ABNORMAL HIGH (ref 0–5)
HCT: 40.1 % (ref 36.0–46.0)
Hemoglobin: 13.5 g/dL (ref 12.0–15.0)
Lymphocytes Relative: 25 % (ref 12–46)
Lymphs Abs: 1.3 10*3/uL (ref 0.7–4.0)
MCH: 30.7 pg (ref 26.0–34.0)
MCHC: 33.7 g/dL (ref 30.0–36.0)
MCV: 91.1 fL (ref 78.0–100.0)
Monocytes Absolute: 0.4 10*3/uL (ref 0.1–1.0)
Monocytes Relative: 8 % (ref 3–12)
Neutro Abs: 3.2 10*3/uL (ref 1.7–7.7)
Neutrophils Relative %: 61 % (ref 43–77)
Platelets: 179 10*3/uL (ref 150–400)
RBC: 4.4 MIL/uL (ref 3.87–5.11)
RDW: 12.8 % (ref 11.5–15.5)
WBC: 5.2 10*3/uL (ref 4.0–10.5)

## 2013-06-23 MED ORDER — ACETAMINOPHEN 325 MG PO TABS
650.0000 mg | ORAL_TABLET | Freq: Once | ORAL | Status: AC
  Administered 2013-06-23: 650 mg via ORAL
  Filled 2013-06-23: qty 2

## 2013-06-23 NOTE — ED Provider Notes (Signed)
CSN: [id number]     Arrival date & time 06/23/13  1447 History   First MD Initiated Contact with Patient 06/23/13 [phone]     Chief Complaint  Patient presents with  . Fall  . Head Laceration     (Consider location/radiation/quality/duration/timing/severity/associated sxs/prior Treatment) HPI Comments: Patient is a 73 year old female with a past medical history of previous stroke with residual aphasia and right sided deficits and hypertension who presents after a mechanical fall that happened prior to arrival. Patient presents from [facility] independent living center with her husband who witnessed her fall. Patient tripped while she was getting off the elevator. She fell and hit the right side of her head and immediately started having bleeding. Patient reports pain where she hit her head. She denies any other injury. Patient takes Plavix. She denies LOC. No other associated symptoms. Patient applied a bandage to the wound but is having trouble controlling the bleeding.    Past Medical History  Diagnosis Date  . Stroke   . Hypertension   . Heart block     Stent  . Ear pain     right  . Hyperlipidemia   . Central retinal vein occlusion, right eye   . Diplopia   . Eosinophilic esophagitis    Past Surgical History  Procedure Laterality Date  . Abdominal hysterectomy    . Carotid stent    . Lasix    . Cataracts    . Retina injection    . Esophagogastroduodenoscopy     No family history on file. History  Substance Use Topics  . Smoking status: Never Smoker   . Smokeless tobacco: Not on file  . Alcohol Use: Yes     Comment: glas of wine a day   OB History   Grav Para Term Preterm Abortions TAB SAB Ect Mult Living                 Review of Systems  Constitutional: Negative for fever, chills and fatigue.  HENT: Negative for trouble swallowing.   Eyes: Negative for visual disturbance.  Respiratory: Negative for shortness of breath.   Cardiovascular: Negative for chest pain  and palpitations.  Gastrointestinal: Negative for nausea, vomiting, abdominal pain and diarrhea.  Genitourinary: Negative for dysuria and difficulty urinating.  Musculoskeletal: Negative for arthralgias and neck pain.  Skin: Positive for wound. Negative for color change.  Neurological: Positive for headaches. Negative for dizziness and weakness.  Psychiatric/Behavioral: Negative for dysphoric mood.      Allergies  Review of patient's allergies indicates no known allergies.  Home Medications   Current Outpatient Rx  Name  Route  Sig  Dispense  Refill  . aspirin 81 MG tablet   Oral   Take 81 mg by mouth daily.         Marland Kitchen atenolol (TENORMIN) 25 MG tablet   Oral   Take 12.5 mg by mouth 2 (two) times daily.         Marland Kitchen atorvastatin (LIPITOR) 40 MG tablet   Oral   Take 80 mg by mouth daily at 6 PM.          . clopidogrel (PLAVIX) 75 MG tablet   Oral   Take 75 mg by mouth daily.         . diclofenac sodium (VOLTAREN) 1 % GEL   Topical   Apply 4 g topically 4 (four) times daily.         . isosorbide mononitrate (IMDUR) 30 MG  24 hr tablet   Oral   Take 30 mg by mouth daily.         Marland Kitchen omeprazole (PRILOSEC) 20 MG capsule   Oral   Take 20 mg by mouth daily.         . pregabalin (LYRICA) 150 MG capsule   Oral   Take 150 mg by mouth 2 (two) times daily.          . ranolazine (RANEXA) 500 MG 12 hr tablet   Oral   Take 500 mg by mouth 2 (two) times daily.         . sertraline (ZOLOFT) 50 MG tablet   Oral   Take 75 mg by mouth every evening.           BP 132/89  Pulse 57  Temp(Src) 98.9 F (37.2 C) (Oral)  Resp 18  SpO2 95% Physical Exam  Nursing note and vitals reviewed. Constitutional: She is oriented to person, place, and time. She appears well-developed and well-nourished. No distress.  HENT:  Head: Normocephalic and atraumatic.  Mouth/Throat: Oropharynx is clear and moist. No oropharyngeal exudate.  2.5 cm laceration of right temporal area  that is oozing blood with underlying hematoma.   Eyes: Conjunctivae and EOM are normal. Pupils are equal, round, and reactive to light.  Neck: Normal range of motion.  Cardiovascular: Normal rate and regular rhythm.  Exam reveals no gallop and no friction rub.   No murmur heard. Pulmonary/Chest: Effort normal and breath sounds normal. She has no wheezes. She has no rales. She exhibits no tenderness.  Abdominal: Soft. She exhibits no distension. There is no tenderness. There is no rebound and no guarding.  Musculoskeletal: Normal range of motion.  Neurological: She is alert and oriented to person, place, and time. No cranial nerve deficit. Coordination normal.  Residual aphasia from previous stroke as well as right extremity weakness and paresthesia. No other neuro deficits noted.    Skin: Skin is warm and dry.  Psychiatric: She has a normal mood and affect. Her behavior is normal.    ED Course  Procedures (including critical care time)  LACERATION REPAIR Performed by: Alvina Chou Authorized by: Alvina Chou Consent: Verbal consent obtained. Risks and benefits: risks, benefits and alternatives were discussed Consent given by: patient Patient identity confirmed: provided demographic data Prepped and Draped in normal sterile fashion Wound explored  Laceration Location: right temporal scalp  Laceration Length: 2.5 cm  No Foreign Bodies seen or palpated  Anesthesia: local infiltration  Local anesthetic: lidocaine 2% with epinephrine  Anesthetic total: 4 ml  Irrigation method: syringe Amount of cleaning: standard  Skin closure: staples  Number of staples: 4  Technique: n/a  Patient tolerance: Patient tolerated the procedure well with no immediate complications.   Labs Review Labs Reviewed  CBC WITH DIFFERENTIAL - Abnormal; Notable for the following:    Eosinophils Relative 6 (*)    All other components within normal limits  BASIC METABOLIC PANEL -  Abnormal; Notable for the following:    Glucose, Bld 102 (*)    GFR calc non Af Amer 82 (*)    All other components within normal limits   Imaging Review Ct Head Wo Contrast  06/23/2013   CLINICAL DATA:  Patient fell and hit the back of the head  EXAM: CT HEAD WITHOUT CONTRAST  CT CERVICAL SPINE WITHOUT CONTRAST  TECHNIQUE: Multidetector CT imaging of the head and cervical spine was performed following the standard protocol without intravenous contrast. Multiplanar  CT image reconstructions of the cervical spine were also generated.  COMPARISON:  CT NECK W/CM dated 12/29/2012  FINDINGS: CT HEAD FINDINGS  There is no evidence of mass effect, midline shift, or extra-axial fluid collections. There is no evidence of a space-occupying lesion or intracranial hemorrhage. There is no evidence of a cortical-based area of acute infarction. There is encephalomalacia of the left fronto-temporo-parietal lobes. There is generalized cerebral atrophy. There is periventricular white matter low attenuation likely secondary to microangiopathy.  The ventricles and sulci are appropriate for the patient's age. The basal cisterns are patent.  Visualized portions of the orbits are unremarkable. The visualized portions of the paranasal sinuses and mastoid air cells are unremarkable. Cerebrovascular atherosclerotic calcifications are noted.  The osseous structures are unremarkable. There is a large right scalp hematoma.  CT CERVICAL SPINE FINDINGS  The alignment is anatomic. The vertebral body heights are maintained. There is no acute fracture. There is no static listhesis. The prevertebral soft tissues are normal. The intraspinal soft tissues are not fully imaged on this examination due to poor soft tissue contrast, but there is no gross soft tissue abnormality.  There is degenerative disc disease at C5-6, C6-7 and to a lesser degree C3-4. There is diffuse bilateral facet arthropathy, right greater than left. There is osseous fusion of  the right posterior elements of C3-4. There is bilateral foraminal stenosis at C3-4, right greater than left. There is bilateral foraminal stenosis at C4-5, severe on the right. There is bilateral severe foraminal stenosis at C5-6. There is bilateral severe foraminal stenosis at C6-7 particularly on the left.  The visualized portions of the lung apices demonstrate no focal abnormality. There is bilateral carotid artery atherosclerosis.  IMPRESSION: 1. No acute intracranial pathology. 2. No acute osseous injury of the cervical spine. 3. Diffuse cervical spondylosis as described above.   Electronically Signed   By: Kathreen Devoid   On: 06/23/2013 16:08   Ct Cervical Spine Wo Contrast  06/23/2013   CLINICAL DATA:  Patient fell and hit the back of the head  EXAM: CT HEAD WITHOUT CONTRAST  CT CERVICAL SPINE WITHOUT CONTRAST  TECHNIQUE: Multidetector CT imaging of the head and cervical spine was performed following the standard protocol without intravenous contrast. Multiplanar CT image reconstructions of the cervical spine were also generated.  COMPARISON:  CT NECK W/CM dated 12/29/2012  FINDINGS: CT HEAD FINDINGS  There is no evidence of mass effect, midline shift, or extra-axial fluid collections. There is no evidence of a space-occupying lesion or intracranial hemorrhage. There is no evidence of a cortical-based area of acute infarction. There is encephalomalacia of the left fronto-temporo-parietal lobes. There is generalized cerebral atrophy. There is periventricular white matter low attenuation likely secondary to microangiopathy.  The ventricles and sulci are appropriate for the patient's age. The basal cisterns are patent.  Visualized portions of the orbits are unremarkable. The visualized portions of the paranasal sinuses and mastoid air cells are unremarkable. Cerebrovascular atherosclerotic calcifications are noted.  The osseous structures are unremarkable. There is a large right scalp hematoma.  CT CERVICAL  SPINE FINDINGS  The alignment is anatomic. The vertebral body heights are maintained. There is no acute fracture. There is no static listhesis. The prevertebral soft tissues are normal. The intraspinal soft tissues are not fully imaged on this examination due to poor soft tissue contrast, but there is no gross soft tissue abnormality.  There is degenerative disc disease at C5-6, C6-7 and to a lesser degree C3-4. There is diffuse  bilateral facet arthropathy, right greater than left. There is osseous fusion of the right posterior elements of C3-4. There is bilateral foraminal stenosis at C3-4, right greater than left. There is bilateral foraminal stenosis at C4-5, severe on the right. There is bilateral severe foraminal stenosis at C5-6. There is bilateral severe foraminal stenosis at C6-7 particularly on the left.  The visualized portions of the lung apices demonstrate no focal abnormality. There is bilateral carotid artery atherosclerosis.  IMPRESSION: 1. No acute intracranial pathology. 2. No acute osseous injury of the cervical spine. 3. Diffuse cervical spondylosis as described above.   Electronically Signed   By: Kathreen Devoid   On: 06/23/2013 16:08     EKG Interpretation None      MDM   Final diagnoses:  Fall  Head injury  Scalp laceration   3:47 PM CT head and cervical spine pending. Labs pending. Vitals stable and patient afebrile. Patient's wound is bandaged and I will wait for imaging to return.  Patient's imaging is unremarkable for acute changes. Patient has no new neuro deficits. Wound stapled without complication. Patient given tylenol for headache. Patient advised to resume Plavix per usual since bleeding is controlled. Patient advised to return to the ED with worsening or concerning symptoms.     Alvina Chou, PA-C 06/24/13 1708

## 2013-06-23 NOTE — ED Notes (Signed)
Pt arrived from [facility]. Pt was getting off of elevator and fell hitting right lateral side of head on the side of elevator. Large hematoma with bleeding noted. Pt denies any dizziness or LOC prior to or post fall. Sinus brady on the monitor. Pt A&O at baseline. Hx of stroke in 2001 with aphasia and right sided weakness.

## 2013-06-23 NOTE — Discharge Instructions (Signed)
Take tylenol as needed for headache. Go to your primary care physician in 5 days for staple removal or return to the ED for staple removal. Refer to attached documents for more information. Return to the ED with worsening or concerning symptoms.

## 2013-06-24 NOTE — ED Provider Notes (Signed)
Medical screening examination/treatment/procedure(s) were conducted as a shared visit with non-physician practitioner(s) and myself.  I personally evaluated the patient during the encounter.   EKG Interpretation None      Well appearing. Bleeding controled. Laceration repaired. Head CT without bleed  Hoy Morn, MD 06/24/13 [id number]

## 2013-08-13 ENCOUNTER — Encounter (INDEPENDENT_AMBULATORY_CARE_PROVIDER_SITE_OTHER): Payer: Medicare HMO | Admitting: Ophthalmology

## 2013-08-18 ENCOUNTER — Encounter (INDEPENDENT_AMBULATORY_CARE_PROVIDER_SITE_OTHER): Payer: Medicare HMO | Admitting: Ophthalmology

## 2013-08-25 ENCOUNTER — Encounter (INDEPENDENT_AMBULATORY_CARE_PROVIDER_SITE_OTHER): Payer: Medicare HMO | Admitting: Ophthalmology

## 2013-08-25 DIAGNOSIS — H43819 Vitreous degeneration, unspecified eye: Secondary | ICD-10-CM

## 2013-08-25 DIAGNOSIS — I10 Essential (primary) hypertension: Secondary | ICD-10-CM

## 2013-08-25 DIAGNOSIS — H348192 Central retinal vein occlusion, unspecified eye, stable: Secondary | ICD-10-CM

## 2013-08-25 DIAGNOSIS — H35039 Hypertensive retinopathy, unspecified eye: Secondary | ICD-10-CM

## 2013-12-01 ENCOUNTER — Encounter (INDEPENDENT_AMBULATORY_CARE_PROVIDER_SITE_OTHER): Payer: Medicare HMO | Admitting: Ophthalmology

## 2013-12-01 DIAGNOSIS — I10 Essential (primary) hypertension: Secondary | ICD-10-CM

## 2013-12-01 DIAGNOSIS — H43819 Vitreous degeneration, unspecified eye: Secondary | ICD-10-CM

## 2013-12-01 DIAGNOSIS — H348192 Central retinal vein occlusion, unspecified eye, stable: Secondary | ICD-10-CM

## 2013-12-01 DIAGNOSIS — H35039 Hypertensive retinopathy, unspecified eye: Secondary | ICD-10-CM

## 2013-12-09 ENCOUNTER — Inpatient Hospital Stay (HOSPITAL_COMMUNITY)
Admission: EM | Admit: 2013-12-09 | Discharge: 2013-12-24 | DRG: 234 | Disposition: A | Discharge status: Skilled Nursing Facility | Payer: Medicare PPO | Attending: Surgery | Admitting: Surgery

## 2013-12-09 ENCOUNTER — Encounter (HOSPITAL_COMMUNITY): Payer: Self-pay | Admitting: Emergency Medicine

## 2013-12-09 ENCOUNTER — Emergency Department (HOSPITAL_COMMUNITY): Payer: Medicare PPO

## 2013-12-09 ENCOUNTER — Other Ambulatory Visit: Payer: Self-pay | Admitting: Physician Assistant

## 2013-12-09 DIAGNOSIS — I200 Unstable angina: Secondary | ICD-10-CM

## 2013-12-09 DIAGNOSIS — Z79899 Other long term (current) drug therapy: Secondary | ICD-10-CM

## 2013-12-09 DIAGNOSIS — R079 Chest pain, unspecified: Secondary | ICD-10-CM | POA: Diagnosis not present

## 2013-12-09 DIAGNOSIS — I2510 Atherosclerotic heart disease of native coronary artery without angina pectoris: Secondary | ICD-10-CM | POA: Diagnosis not present

## 2013-12-09 DIAGNOSIS — I10 Essential (primary) hypertension: Secondary | ICD-10-CM

## 2013-12-09 DIAGNOSIS — J988 Other specified respiratory disorders: Secondary | ICD-10-CM | POA: Diagnosis not present

## 2013-12-09 DIAGNOSIS — K219 Gastro-esophageal reflux disease without esophagitis: Secondary | ICD-10-CM | POA: Diagnosis present

## 2013-12-09 DIAGNOSIS — Z951 Presence of aortocoronary bypass graft: Secondary | ICD-10-CM

## 2013-12-09 DIAGNOSIS — R9431 Abnormal electrocardiogram [ECG] [EKG]: Secondary | ICD-10-CM | POA: Diagnosis present

## 2013-12-09 DIAGNOSIS — J9819 Other pulmonary collapse: Secondary | ICD-10-CM | POA: Diagnosis not present

## 2013-12-09 DIAGNOSIS — Y9210 Unspecified residential institution as the place of occurrence of the external cause: Secondary | ICD-10-CM | POA: Diagnosis not present

## 2013-12-09 DIAGNOSIS — I25110 Atherosclerotic heart disease of native coronary artery with unstable angina pectoris: Secondary | ICD-10-CM

## 2013-12-09 DIAGNOSIS — E785 Hyperlipidemia, unspecified: Secondary | ICD-10-CM

## 2013-12-09 DIAGNOSIS — I69320 Aphasia following cerebral infarction: Secondary | ICD-10-CM

## 2013-12-09 DIAGNOSIS — D62 Acute posthemorrhagic anemia: Secondary | ICD-10-CM | POA: Diagnosis not present

## 2013-12-09 DIAGNOSIS — Z7902 Long term (current) use of antithrombotics/antiplatelets: Secondary | ICD-10-CM

## 2013-12-09 DIAGNOSIS — Z9861 Coronary angioplasty status: Secondary | ICD-10-CM

## 2013-12-09 DIAGNOSIS — Y832 Surgical operation with anastomosis, bypass or graft as the cause of abnormal reaction of the patient, or of later complication, without mention of misadventure at the time of the procedure: Secondary | ICD-10-CM | POA: Diagnosis not present

## 2013-12-09 DIAGNOSIS — G8110 Spastic hemiplegia affecting unspecified side: Secondary | ICD-10-CM | POA: Diagnosis present

## 2013-12-09 DIAGNOSIS — I6330 Cerebral infarction due to thrombosis of unspecified cerebral artery: Secondary | ICD-10-CM | POA: Diagnosis present

## 2013-12-09 DIAGNOSIS — I69959 Hemiplegia and hemiparesis following unspecified cerebrovascular disease affecting unspecified side: Secondary | ICD-10-CM

## 2013-12-09 DIAGNOSIS — I498 Other specified cardiac arrhythmias: Secondary | ICD-10-CM | POA: Diagnosis present

## 2013-12-09 DIAGNOSIS — I25118 Atherosclerotic heart disease of native coronary artery with other forms of angina pectoris: Secondary | ICD-10-CM

## 2013-12-09 DIAGNOSIS — I69920 Aphasia following unspecified cerebrovascular disease: Secondary | ICD-10-CM

## 2013-12-09 DIAGNOSIS — Z7982 Long term (current) use of aspirin: Secondary | ICD-10-CM

## 2013-12-09 DIAGNOSIS — E8779 Other fluid overload: Secondary | ICD-10-CM | POA: Diagnosis not present

## 2013-12-09 HISTORY — DX: Gastro-esophageal reflux disease without esophagitis: K21.9

## 2013-12-09 HISTORY — DX: Essential (primary) hypertension: I10

## 2013-12-09 HISTORY — DX: Unspecified urinary incontinence: R32

## 2013-12-09 HISTORY — DX: Atherosclerotic heart disease of native coronary artery without angina pectoris: I25.10

## 2013-12-09 HISTORY — DX: Hyperlipidemia, unspecified: E78.5

## 2013-12-09 HISTORY — DX: Unspecified osteoarthritis, unspecified site: M19.90

## 2013-12-09 LAB — CBC
HCT: 41.4 % (ref 36.0–46.0)
Hemoglobin: 13.6 g/dL (ref 12.0–15.0)
MCH: 28.5 pg (ref 26.0–34.0)
MCHC: 32.9 g/dL (ref 30.0–36.0)
MCV: 86.6 fL (ref 78.0–100.0)
Platelets: 196 10*3/uL (ref 150–400)
RBC: 4.78 MIL/uL (ref 3.87–5.11)
RDW: 14.6 % (ref 11.5–15.5)
WBC: 4.7 10*3/uL (ref 4.0–10.5)

## 2013-12-09 LAB — COMPREHENSIVE METABOLIC PANEL
ALT: 13 U/L (ref 0–35)
AST: 17 U/L (ref 0–37)
Albumin: 3.5 g/dL (ref 3.5–5.2)
Alkaline Phosphatase: 80 U/L (ref 39–117)
Anion gap: 12 (ref 5–15)
BUN: 17 mg/dL (ref 6–23)
CO2: 23 mEq/L (ref 19–32)
Calcium: 9.2 mg/dL (ref 8.4–10.5)
Chloride: 105 mEq/L (ref 96–112)
Creatinine, Ser: 0.72 mg/dL (ref 0.50–1.10)
GFR calc Af Amer: >90 mL/min (ref >90)
GFR calc non Af Amer: 82 mL/min — ABNORMAL LOW (ref >90)
Glucose, Bld: 104 mg/dL — ABNORMAL HIGH (ref 70–99)
Potassium: 4.2 mEq/L (ref 3.7–5.3)
Sodium: 140 mEq/L (ref 137–147)
Total Bilirubin: 0.7 mg/dL (ref 0.3–1.2)
Total Protein: 6.7 g/dL (ref 6.0–8.3)

## 2013-12-09 LAB — URINALYSIS, ROUTINE W REFLEX MICROSCOPIC
Bilirubin Urine: NEGATIVE
Glucose, UA: NEGATIVE mg/dL
Ketones, ur: NEGATIVE mg/dL
Leukocytes, UA: NEGATIVE
Nitrite: NEGATIVE
Protein, ur: NEGATIVE mg/dL
Specific Gravity, Urine: 1.012 (ref 1.005–1.030)
Urobilinogen, UA: 0.2 mg/dL (ref 0.0–1.0)
pH: 6 (ref 5.0–8.0)

## 2013-12-09 LAB — URINE MICROSCOPIC-ADD ON

## 2013-12-09 LAB — I-STAT TROPONIN, ED: Troponin i, poc: 0 ng/mL (ref 0.00–0.08)

## 2013-12-09 LAB — LIPASE, BLOOD: Lipase: 15 U/L (ref 11–59)

## 2013-12-09 LAB — TROPONIN I
Troponin I: <0.3 ng/mL (ref <0.30)
Troponin I: <0.3 ng/mL (ref <0.30)

## 2013-12-09 MED ORDER — ATORVASTATIN CALCIUM 80 MG PO TABS
80.0000 mg | ORAL_TABLET | Freq: Every day | ORAL | Status: DC
  Administered 2013-12-09 – 2013-12-23 (×14): 80 mg via ORAL
  Filled 2013-12-09 (×17): qty 1

## 2013-12-09 MED ORDER — TRIMETHOPRIM 100 MG PO TABS
100.0000 mg | ORAL_TABLET | Freq: Every day | ORAL | Status: DC
  Administered 2013-12-09 – 2013-12-16 (×7): 100 mg via ORAL
  Filled 2013-12-09 (×10): qty 1

## 2013-12-09 MED ORDER — RANOLAZINE ER 500 MG PO TB12
500.0000 mg | ORAL_TABLET | Freq: Two times a day (BID) | ORAL | Status: DC
  Administered 2013-12-09 – 2013-12-16 (×14): 500 mg via ORAL
  Filled 2013-12-09 (×18): qty 1

## 2013-12-09 MED ORDER — OXYBUTYNIN CHLORIDE 5 MG PO TABS
5.0000 mg | ORAL_TABLET | Freq: Every day | ORAL | Status: DC
  Administered 2013-12-10 – 2013-12-24 (×13): 5 mg via ORAL
  Filled 2013-12-09 (×16): qty 1

## 2013-12-09 MED ORDER — ASPIRIN 81 MG PO TABS: 81.0000 mg | ORAL_TABLET | Freq: Every day | ORAL | Status: DC

## 2013-12-09 MED ORDER — CLOPIDOGREL BISULFATE 75 MG PO TABS
75.0000 mg | ORAL_TABLET | Freq: Every day | ORAL | Status: DC
  Administered 2013-12-10 – 2013-12-13 (×4): 75 mg via ORAL
  Filled 2013-12-09 (×4): qty 1

## 2013-12-09 MED ORDER — PREGABALIN 75 MG PO CAPS
150.0000 mg | ORAL_CAPSULE | Freq: Two times a day (BID) | ORAL | Status: DC
  Administered 2013-12-09 – 2013-12-24 (×26): 150 mg via ORAL
  Filled 2013-12-09 (×2): qty 2
  Filled 2013-12-09: qty 1
  Filled 2013-12-09 (×12): qty 2
  Filled 2013-12-09: qty 1
  Filled 2013-12-09 (×6): qty 2
  Filled 2013-12-09 (×2): qty 1
  Filled 2013-12-09 (×6): qty 2

## 2013-12-09 MED ORDER — ONDANSETRON HCL 4 MG/2ML IJ SOLN: 4.0000 mg | Freq: Four times a day (QID) | INTRAMUSCULAR | Status: DC | PRN

## 2013-12-09 MED ORDER — ATENOLOL 12.5 MG HALF TABLET
12.5000 mg | ORAL_TABLET | Freq: Two times a day (BID) | ORAL | Status: DC
  Administered 2013-12-10 – 2013-12-16 (×11): 12.5 mg via ORAL
  Filled 2013-12-09 (×18): qty 1

## 2013-12-09 MED ORDER — SERTRALINE HCL 50 MG PO TABS
75.0000 mg | ORAL_TABLET | Freq: Every day | ORAL | Status: DC
  Administered 2013-12-09 – 2013-12-23 (×15): 75 mg via ORAL
  Filled 2013-12-09 (×19): qty 1

## 2013-12-09 MED ORDER — NITROGLYCERIN 0.4 MG SL SUBL: 0.4000 mg | SUBLINGUAL_TABLET | SUBLINGUAL | Status: DC | PRN

## 2013-12-09 MED ORDER — ENOXAPARIN SODIUM 40 MG/0.4ML ~~LOC~~ SOLN
40.0000 mg | SUBCUTANEOUS | Status: DC
  Administered 2013-12-09 – 2013-12-12 (×4): 40 mg via SUBCUTANEOUS
  Filled 2013-12-09 (×5): qty 0.4

## 2013-12-09 MED ORDER — ACETAMINOPHEN 325 MG PO TABS: 650.0000 mg | ORAL_TABLET | ORAL | Status: DC | PRN

## 2013-12-09 MED ORDER — PANTOPRAZOLE SODIUM 40 MG PO TBEC
40.0000 mg | DELAYED_RELEASE_TABLET | Freq: Every day | ORAL | Status: DC
  Administered 2013-12-10 – 2013-12-16 (×6): 40 mg via ORAL
  Filled 2013-12-09 (×6): qty 1

## 2013-12-09 MED ORDER — NITROGLYCERIN 0.4 MG SL SUBL: 0.4000 mg | SUBLINGUAL_TABLET | Freq: Once | SUBLINGUAL | Status: DC

## 2013-12-09 MED ORDER — ASPIRIN EC 81 MG PO TBEC
81.0000 mg | DELAYED_RELEASE_TABLET | Freq: Every day | ORAL | Status: DC
  Administered 2013-12-10 – 2013-12-12 (×3): 81 mg via ORAL
  Filled 2013-12-09 (×4): qty 1

## 2013-12-09 MED ORDER — ISOSORBIDE MONONITRATE ER 30 MG PO TB24
30.0000 mg | ORAL_TABLET | Freq: Every day | ORAL | Status: DC
  Administered 2013-12-10 – 2013-12-16 (×6): 30 mg via ORAL
  Filled 2013-12-09 (×8): qty 1

## 2013-12-09 NOTE — ED Notes (Signed)
Dr. Pickering at the bedside.  

## 2013-12-09 NOTE — H&P (Signed)
Patient ID: Kristin [name] MRN: [id number], DOB/AGE: 21-Jun-1940   Admit date: 12/09/2013   Primary Physician: No PCP Per Patient Primary Cardiologist: None  HPI:   73 y/o female, (Dr Celesta Aver mother in law), who has a history of CVA in 2001 with resultant Rt spastic hemiplegia and aphasia. Her husband was with her in the exam room and helped with the history. She has had no further cardiac problems and does not see a cardiologist. This am she developed epigastric pain associated with mild diaphoresis. It did not radiate to her arms or jaw. She did indicate it was similar to her previous PCI. EKG shows TWI V2 and V3, no old EKGs to compare. Her Troponin is negative x 1. She is currently pain free.    Problem List: Past Medical History  Diagnosis Date  . Stroke   . Hypertension   . Heart block     Stent  . Ear pain     right  . Hyperlipidemia   . Central retinal vein occlusion, right eye   . Diplopia   . Eosinophilic esophagitis     Past Surgical History  Procedure Laterality Date  . Abdominal hysterectomy    . Carotid stent    . Lasix    . Cataracts    . Retina injection    . Esophagogastroduodenoscopy       Allergies: No Known Allergies   Home Medications Current Facility-Administered Medications  Medication Dose Route Frequency Provider Last Rate Last Dose  . nitroGLYCERIN (NITROSTAT) SL tablet 0.4 mg  0.4 mg Sublingual Once Kelby Aline, MD       Current Outpatient Prescriptions  Medication Sig Dispense Refill  . aspirin 81 MG tablet Take 81 mg by mouth daily.      Marland Kitchen atenolol (TENORMIN) 25 MG tablet Take 12.5 mg by mouth 2 (two) times daily.      Marland Kitchen atorvastatin (LIPITOR) 40 MG tablet Take 80 mg by mouth daily at 6 PM.       . clopidogrel (PLAVIX) 75 MG tablet Take 75 mg by mouth daily.      . diclofenac sodium (VOLTAREN) 1 % GEL Apply 4 g topically 4 (four) times daily as needed (pain).       . isosorbide mononitrate (IMDUR) 30 MG 24 hr tablet Take 30 mg by  mouth daily.      Marland Kitchen omeprazole (PRILOSEC) 20 MG capsule Take 20 mg by mouth daily.      Marland Kitchen oxybutynin (DITROPAN) 5 MG tablet Take 5 mg by mouth daily.      . pregabalin (LYRICA) 150 MG capsule Take 150 mg by mouth 2 (two) times daily.       . ranolazine (RANEXA) 500 MG 12 hr tablet Take 500 mg by mouth 2 (two) times daily.      . sertraline (ZOLOFT) 50 MG tablet Take 75 mg by mouth every evening.       . trimethoprim (TRIMPEX) 100 MG tablet Take 100 mg by mouth daily.         No family history on file.   History   Social History  . Marital Status: Married    Spouse Name: N/A    Number of Children: N/A  . Years of Education: N/A   Occupational History  . Not on file.   Social History Main Topics  . Smoking status: Never Smoker   . Smokeless tobacco: Not on file  . Alcohol Use: Yes  Comment: glas of wine a day  . Drug Use: No  . Sexual Activity: Not on file   Other Topics Concern  . Not on file   Social History Narrative  . No narrative on file     Review of Systems: General: negative for chills, fever, night sweats or weight changes.  Cardiovascular: negative for chest pain, dyspnea on exertion, edema, orthopnea, palpitations, paroxysmal nocturnal dyspnea or shortness of breath Dermatological: negative for rash Respiratory: negative for cough or wheezing Urologic: negative for hematuria Abdominal: negative for nausea, vomiting, diarrhea, bright red blood per rectum, melena, or hematemesis Neurologic: negative for visual changes, syncope, or dizziness All other systems reviewed and are otherwise negative except as noted above. She had Botox injections in 2013 for spacticity  Physical Exam: Blood pressure 104/65, pulse 51, temperature 97.4 F (36.3 C), resp. rate 12, SpO2 94.00%.  General appearance: alert, cooperative, no distress and aphasic Neck: no carotid bruit and no JVD Lungs: basilar crackles Heart: regular rate and rhythm Abdomen: soft, non-tender;  bowel sounds normal; no masses,  no organomegaly Extremities: Rt LE in brace. trace RLE edema, LLE without edema Pulses: 2+ and symmetric Skin: pale, cool, dry Neurologic: Grossly normal, she is awake alert and appropriate. She has Rt hemiparesis and aphasia.     Labs:   Results for orders placed during the hospital encounter of 12/09/13 (from the past 24 hour(s))  CBC     Status: None   Collection Time    12/09/13 11:58 AM      Result Value Ref Range   WBC 4.7  4.0 - 10.5 K/uL   RBC 4.78  3.87 - 5.11 MIL/uL   Hemoglobin 13.6  12.0 - 15.0 g/dL   HCT 41.4  36.0 - 46.0 %   MCV 86.6  78.0 - 100.0 fL   MCH 28.5  26.0 - 34.0 pg   MCHC 32.9  30.0 - 36.0 g/dL   RDW 14.6  11.5 - 15.5 %   Platelets 196  150 - 400 K/uL  COMPREHENSIVE METABOLIC PANEL     Status: Abnormal   Collection Time    12/09/13 11:58 AM      Result Value Ref Range   Sodium 140  137 - 147 mEq/L   Potassium 4.2  3.7 - 5.3 mEq/L   Chloride 105  96 - 112 mEq/L   CO2 23  19 - 32 mEq/L   Glucose, Bld 104 (*) 70 - 99 mg/dL   BUN 17  6 - 23 mg/dL   Creatinine, Ser 0.72  0.50 - 1.10 mg/dL   Calcium 9.2  8.4 - 10.5 mg/dL   Total Protein 6.7  6.0 - 8.3 g/dL   Albumin 3.5  3.5 - 5.2 g/dL   AST 17  0 - 37 U/L   ALT 13  0 - 35 U/L   Alkaline Phosphatase 80  39 - 117 U/L   Total Bilirubin 0.7  0.3 - 1.2 mg/dL   GFR calc non Af Amer 82 (*) >90 mL/min   GFR calc Af Amer >90  >90 mL/min   Anion gap 12  5 - 15  LIPASE, BLOOD     Status: None   Collection Time    12/09/13 11:58 AM      Result Value Ref Range   Lipase 15  11 - 59 U/L  I-STAT TROPOININ, ED     Status: None   Collection Time    12/09/13 12:03 PM  Result Value Ref Range   Troponin i, poc 0.00  0.00 - 0.08 ng/mL   Comment 3              Radiology/Studies: Dg Chest Port 1 View  12/09/2013   CLINICAL DATA:  Chest pain.  EXAM: PORTABLE CHEST - 1 VIEW  COMPARISON:  None.  FINDINGS: Mild cardiomegaly is noted. Both lungs are clear. No pneumothorax or  pleural effusion is noted. The visualized skeletal structures are unremarkable.  IMPRESSION: No acute cardiopulmonary abnormality seen.   Electronically Signed   By: Sabino Dick M.D.   On: 12/09/2013 12:15    EKG:NSR, LAD, TWI V2, V3.   ASSESSMENT AND PLAN:  Principal Problem:   Chest pain with moderate risk for cardiac etiology Active Problems:   CAD- hx of two stents ? 2010 at [facility]   CVA 2001   Abnormal EKG   Spastic hemiplegia affecting dominant side   Aphasia as late effect of stroke   Dyslipidemia   PLAN: Will review with MD- Dr Celesta Aver contact number-[phone]. Admit, r/o MI.    Henri Medal, PA-C 12/09/2013, 2:21 PM  Patient was seen in ER with Kerin Ransom PA-C. Her history is somewhat difficult because of her expressive aphasia but her discomfort this am occurred suddenly and was very intense and was both epigastric and substernal. There was no radiation. She took ASA with partial improvement. EMS gave her SL NTG which also helped further. The patient had stents in [place] 4 years ago. She has not seen a cardiologist since she moved to [place] 2 years ago. Her outpatient medication was reviewed. Her husband is not sure why she is on Ranexa.I reviewed her chest xray. She has mild cardiomegaly. No radiographic CHF although she does have mild basilar rales. She denies any dyspnea. We will assess her systolic and diastolic function with 2D echo. Her heart sounds are normal. There is no precordial or epigastric tenderness to palpation. Right leg in brace. Plan: admit to monitored bed. Cycle enzymes and EKGs. Admission EKG reviewed and shows T wave inversion in V2-V3 but no ST depression. No old EKG for comparison. Continue dual antiplatelet therapy.  No IV heparin unless troponins turn positive. If troponins stay negative we will plan on Lexiscan Myoview for tomorrow morning. Continue PPI and other home meds. I attempted to call Dr. Greig Castilla voicemail, left a message.

## 2013-12-09 NOTE — ED Provider Notes (Signed)
I saw and evaluated the patient, reviewed the resident's note and I agree with the findings and plan.   EKG Interpretation  Date/Time:  Thursday December 09 2013 11:30:45 EDT Ventricular Rate:  68 PR Interval:  172 QRS Duration: 81 QT Interval:  440 QTC Calculation: 468 R Axis:   -39 Text Interpretation:  Sinus rhythm Ventricular premature complex Left axis deviation Low voltage, precordial leads Nonspecific T abnormalities, anterior leads Confirmed by Alvino Chapel  MD, Ovid Curd [id number]) on 12/09/2013 3:42:18 PM      Patient with chest pain. Has a history of expressive aphasia. Previous stents. EKG has nonspecific changes. Enzymes negative x1. Will admit to cardiology  Jasper Riling. Alvino Chapel, MD 12/09/13 [phone]

## 2013-12-09 NOTE — ED Notes (Signed)
MD at the bedside  

## 2013-12-09 NOTE — ED Provider Notes (Signed)
CSN: [id number]     Arrival date & time 12/09/13  1119 History   First MD Initiated Contact with Patient 12/09/13 1127     Chief Complaint  Patient presents with  . Chest Pain     (Consider location/radiation/quality/duration/timing/severity/associated sxs/prior Treatment) Patient is a 73 y.o. female presenting with chest pain.  Chest Pain Associated symptoms: abdominal pain, diaphoresis, palpitations and shortness of breath   Associated symptoms: no cough, no fever, no nausea and not vomiting     Ms Mifflin is a 73 year old woman with hx CAD s/p 2 stent placement (year unknown), L MCA CVA 7106, HTN, eosinophilic esophagitis who presents with chest pain. History was obtained from husband who has dementia and son-in-law Dr Carlean Purl of GI but was very limited as she has an expressive aphasia. She was scrapbooking this morning ~ 9 am when she had pain she localized to the epigastrum that radiates down her midline abdomen. It was better when she burped once. It has otherwise been constant. She has had associated diaphoresis but no nausea and denied substernal pain or radiation to neck, arm or back. She had a similar episode ~ 3 weeks ago and her nurse practitioner daughter saw her and sent her to her PCP who gave her a cardiology referral. She was given 2 of nitro and one 324 ASA by ambulance which made the pain better.  Past Medical History  Diagnosis Date  . Stroke   . Hypertension   . Heart block     Stent  . Ear pain     right  . Hyperlipidemia   . Central retinal vein occlusion, right eye   . Diplopia   . Eosinophilic esophagitis    Past Surgical History  Procedure Laterality Date  . Abdominal hysterectomy    . Carotid stent    . Lasix    . Cataracts    . Retina injection    . Esophagogastroduodenoscopy     No family history on file. History  Substance Use Topics  . Smoking status: Never Smoker   . Smokeless tobacco: Not on file  . Alcohol Use: Yes     Comment: glas of wine  a day   OB History   Grav Para Term Preterm Abortions TAB SAB Ect Mult Living                 Review of Systems  Constitutional: Positive for diaphoresis. Negative for fever.  Respiratory: Positive for shortness of breath. Negative for cough.   Cardiovascular: Positive for chest pain and palpitations.  Gastrointestinal: Positive for abdominal pain. Negative for nausea, vomiting, diarrhea, constipation and blood in stool.  Genitourinary: Negative for dysuria.      Allergies  Review of patient's allergies indicates no known allergies.  Home Medications   Prior to Admission medications   Medication Sig Start Date End Date Taking? Authorizing Provider  aspirin 81 MG tablet Take 81 mg by mouth daily.   Yes Historical Provider, MD  atenolol (TENORMIN) 25 MG tablet Take 12.5 mg by mouth 2 (two) times daily.   Yes Historical Provider, MD  atorvastatin (LIPITOR) 40 MG tablet Take 80 mg by mouth daily at 6 PM.    Yes Historical Provider, MD  clopidogrel (PLAVIX) 75 MG tablet Take 75 mg by mouth daily.   Yes Historical Provider, MD  diclofenac sodium (VOLTAREN) 1 % GEL Apply 4 g topically 4 (four) times daily as needed (pain).    Yes Historical Provider, MD  isosorbide  mononitrate (IMDUR) 30 MG 24 hr tablet Take 30 mg by mouth daily.   Yes Historical Provider, MD  omeprazole (PRILOSEC) 20 MG capsule Take 20 mg by mouth daily.   Yes Historical Provider, MD  oxybutynin (DITROPAN) 5 MG tablet Take 5 mg by mouth daily.   Yes Historical Provider, MD  pregabalin (LYRICA) 150 MG capsule Take 150 mg by mouth 2 (two) times daily.    Yes Historical Provider, MD  ranolazine (RANEXA) 500 MG 12 hr tablet Take 500 mg by mouth 2 (two) times daily.   Yes Historical Provider, MD  sertraline (ZOLOFT) 50 MG tablet Take 75 mg by mouth every evening.    Yes Historical Provider, MD  trimethoprim (TRIMPEX) 100 MG tablet Take 100 mg by mouth daily.   Yes Historical Provider, MD   BP 105/86  Pulse 51  Temp(Src)  97.4 F (36.3 C)  Resp 16  SpO2 96% Physical Exam  Constitutional: She is oriented to person, place, and time. She appears well-developed and well-nourished. She appears distressed.  HENT:  Head: Normocephalic and atraumatic.  Mouth/Throat: Oropharynx is clear and moist.  Eyes: EOM are normal. Pupils are equal, round, and reactive to light.  Cardiovascular: Normal rate, regular rhythm, normal heart sounds and intact distal pulses.  Exam reveals no gallop and no friction rub.   No murmur heard. Pulmonary/Chest: Effort normal and breath sounds normal. No respiratory distress.  Abdominal: Soft. Bowel sounds are normal. She exhibits no distension. There is no tenderness.  Musculoskeletal: She exhibits no edema.  Neurological: She is alert and oriented to person, place, and time.  Expressive aphasia  Skin: She is not diaphoretic.    ED Course  Procedures (including critical care time) Labs Review Labs Reviewed  COMPREHENSIVE METABOLIC PANEL - Abnormal; Notable for the following:    Glucose, Bld 104 (*)    GFR calc non Af Amer 82 (*)    All other components within normal limits  CBC  LIPASE, BLOOD  URINALYSIS, ROUTINE W REFLEX MICROSCOPIC  I-STAT TROPOININ, ED    Imaging Review Dg Chest Port 1 View  12/09/2013   CLINICAL DATA:  Chest pain.  EXAM: PORTABLE CHEST - 1 VIEW  COMPARISON:  None.  FINDINGS: Mild cardiomegaly is noted. Both lungs are clear. No pneumothorax or pleural effusion is noted. The visualized skeletal structures are unremarkable.  IMPRESSION: No acute cardiopulmonary abnormality seen.   Electronically Signed   By: Sabino Dick M.D.   On: 12/09/2013 12:15     EKG Interpretation None      MDM   Final diagnoses:  None    11:58AM: T wave inversion in anterior leads but no comparison. Will need cardiac rule-out with history of vascular disease. Also GI work-up as she says the pain is mostly in the epigastrum and she has history of CVa and coronary stenting.  Work-up includes troponin, CBC, CMP, lipase, UA, CXR and will give nitro for pain.  1:23PM: Troponin negative. CXR, CBC, CMP, lipase wnl. UA pending. Will call cardiology with likely admission. Her pain has resolved.  2:56PM: Patient seen by cardiology who will admit to rule out MI.  Kelby Aline, MD 12/09/13 [phone]

## 2013-12-09 NOTE — ED Notes (Signed)
Resident at the bedside

## 2013-12-09 NOTE — ED Notes (Signed)
Cardiology at the bedside.

## 2013-12-09 NOTE — Progress Notes (Signed)
Pt HR 55, BP 117/62.  Atenolol 12.5mg  held per MD on call's (Dr. Radford Pax) order.

## 2013-12-09 NOTE — ED Notes (Signed)
Given [place] sandwiches to pt and husband

## 2013-12-09 NOTE — ED Notes (Signed)
Dr. Mare Ferrari at the bedside.

## 2013-12-09 NOTE — ED Notes (Signed)
Per [facility], pt from home with husband. Unable to verbalize d/t past stroke with severe aphasia. Pt ate breakfast this morning and it was fine. About 0900 started having abd pain that radiated up her chest. When she belches gets some relief. 20g to [facility] and given 324 mg ASA and 2 NTG. Pt felt better after getting that. Now denies pain.

## 2013-12-10 ENCOUNTER — Observation Stay (HOSPITAL_COMMUNITY): Payer: Medicare PPO

## 2013-12-10 ENCOUNTER — Inpatient Hospital Stay (HOSPITAL_COMMUNITY): Payer: Medicare PPO

## 2013-12-10 DIAGNOSIS — I517 Cardiomegaly: Secondary | ICD-10-CM

## 2013-12-10 DIAGNOSIS — G8110 Spastic hemiplegia affecting unspecified side: Secondary | ICD-10-CM

## 2013-12-10 DIAGNOSIS — R079 Chest pain, unspecified: Secondary | ICD-10-CM

## 2013-12-10 DIAGNOSIS — I2510 Atherosclerotic heart disease of native coronary artery without angina pectoris: Principal | ICD-10-CM

## 2013-12-10 LAB — TROPONIN I: Troponin I: <0.3 ng/mL (ref <0.30)

## 2013-12-10 MED ORDER — REGADENOSON 0.4 MG/5ML IV SOLN
INTRAVENOUS | Status: AC
  Filled 2013-12-10: qty 5

## 2013-12-10 MED ORDER — TECHNETIUM TC 99M SESTAMIBI GENERIC - CARDIOLITE
10.0000 | Freq: Once | INTRAVENOUS | Status: AC | PRN
  Administered 2013-12-10: 10 via INTRAVENOUS

## 2013-12-10 MED ORDER — NITROGLYCERIN 0.4 MG SL SUBL
0.4000 mg | SUBLINGUAL_TABLET | SUBLINGUAL | Status: DC | PRN
  Administered 2013-12-10: 0.4 mg via SUBLINGUAL

## 2013-12-10 MED ORDER — NITROGLYCERIN 0.4 MG SL SUBL
SUBLINGUAL_TABLET | SUBLINGUAL | Status: AC
  Administered 2013-12-10: 0.4 mg via SUBLINGUAL
  Filled 2013-12-10: qty 1

## 2013-12-10 MED ORDER — TECHNETIUM TC 99M SESTAMIBI GENERIC - CARDIOLITE
30.0000 | Freq: Once | INTRAVENOUS | Status: AC | PRN
  Administered 2013-12-10: 30 via INTRAVENOUS

## 2013-12-10 MED ORDER — REGADENOSON 0.4 MG/5ML IV SOLN
0.4000 mg | Freq: Once | INTRAVENOUS | Status: AC
  Administered 2013-12-10: 0.4 mg via INTRAVENOUS
  Filled 2013-12-10: qty 5

## 2013-12-10 NOTE — Progress Notes (Signed)
NUC stress HIGH RISK. Cath Monday.  Discussed with patient in ECHO lab. Has trouble verbalizing.     Candee Furbish, MD

## 2013-12-10 NOTE — Progress Notes (Signed)
  Echocardiogram 2D Echocardiogram has been performed.  Mauricio Po 12/10/2013, 2:12 PM

## 2013-12-10 NOTE — Progress Notes (Signed)
Subjective: No apparent CP or SOB  Objective: Vital signs in last 24 hours: Temp:  [97.4 F (36.3 C)-98.1 F (36.7 C)] 98.1 F (36.7 C) (08/21 0624) Pulse Rate:  [50-59] 53 (08/21 0937) Resp:  [12-24] 16 (08/21 0937) BP: (101-150)/(60-86) 116/83 mmHg (08/21 0937) SpO2:  [94 %-96 %] 94 % (08/21 0624) Weight:  [184 lb (83.462 kg)] 184 lb (83.462 kg) (08/21 0624) Last BM Date: 12/08/13  Intake/Output from previous day: 08/20 0701 - 08/21 0700 In: 390 [P.O.:390] Out: 550 [Urine:550] Intake/Output this shift: Total I/O In: -  Out: 300 [Urine:300]  Medications Current Facility-Administered Medications  Medication Dose Route Frequency Provider Last Rate Last Dose  . acetaminophen (TYLENOL) tablet 650 mg  650 mg Oral Q4H PRN Rise Mu, PA-C      . aspirin EC tablet 81 mg  81 mg Oral Daily Darlin Coco, MD      . atenolol (TENORMIN) tablet 12.5 mg  12.5 mg Oral BID Ryan M Dunn, PA-C      . atorvastatin (LIPITOR) tablet 80 mg  80 mg Oral q1800 Areta Haber Dunn, PA-C   80 mg at 12/09/13 1758  . clopidogrel (PLAVIX) tablet 75 mg  75 mg Oral Daily Ryan M Dunn, PA-C      . enoxaparin (LOVENOX) injection 40 mg  40 mg Subcutaneous Q24H Ryan M Dunn, PA-C   40 mg at 12/09/13 2106  . isosorbide mononitrate (IMDUR) 24 hr tablet 30 mg  30 mg Oral Daily Ryan M Dunn, PA-C      . nitroGLYCERIN (NITROSTAT) SL tablet 0.4 mg  0.4 mg Sublingual Once Kelby Aline, MD      . nitroGLYCERIN (NITROSTAT) SL tablet 0.4 mg  0.4 mg Sublingual Q5 Min x 3 PRN Ryan M Dunn, PA-C      . ondansetron [facility]) injection 4 mg  4 mg Intravenous Q6H PRN Areta Haber Dunn, PA-C      . oxybutynin (DITROPAN) tablet 5 mg  5 mg Oral Daily Ryan M Dunn, PA-C      . pantoprazole (PROTONIX) EC tablet 40 mg  40 mg Oral QAC breakfast Ryan M Dunn, PA-C      . pregabalin (LYRICA) capsule 150 mg  150 mg Oral BID Areta Haber Dunn, PA-C   150 mg at 12/09/13 2105  . ranolazine (RANEXA) 12 hr tablet 500 mg  500 mg Oral BID Areta Haber Dunn, PA-C    500 mg at 12/09/13 2106  . sertraline (ZOLOFT) tablet 75 mg  75 mg Oral QHS Rise Mu, PA-C   75 mg at 12/09/13 2106  . trimethoprim (TRIMPEX) tablet 100 mg  100 mg Oral Daily Ryan M Dunn, PA-C   100 mg at 12/09/13 2106    PE: General appearance: alert, cooperative and no distress Lungs: clear to auscultation bilaterally Heart: regular rate and rhythm, S1, S2 normal, no murmur, click, rub or gallop Extremities: No LEE Pulses: 2+ and symmetric Skin: Warm and dry Neurologic: Chronic expressive aphasia, right sided weakness.  Lab Results:   Recent Labs  12/09/13 1158  WBC 4.7  HGB 13.6  HCT 41.4  PLT 196   BMET  Recent Labs  12/09/13 1158  NA 140  K 4.2  CL 105  CO2 23  GLUCOSE 104*  BUN 17  CREATININE 0.72  CALCIUM 9.2     Assessment/Plan   Principal Problem:   Chest pain with moderate risk for cardiac etiology Active Problems:   CVA 2001  Spastic hemiplegia affecting dominant side   Aphasia as late effect of stroke   CAD- hx of two stents ? 2010 at [facility]   Dyslipidemia   Abnormal EKG  Plan:  Ruled out for MI.  Lexiscan cardiolite completed.  Interpretation to follow.  BP stable.   LOS: 1 day    [name], [name] PA-C 12/10/2013 9:59 AM  Personally seen and examined. Agree with above. If NUC low risk, OK for DC.  Prior stents.  Excellent secondary prevention medications.  Candee Furbish, MD

## 2013-12-10 NOTE — Progress Notes (Signed)
UR completed 

## 2013-12-10 NOTE — Progress Notes (Signed)
Assessment done b4 pt left for stress test

## 2013-12-11 NOTE — Progress Notes (Signed)
Patient ID: Kristin [name], female   DOB: 1941-03-16, 73 y.o.   MRN: [id number]    Subjective: No apparent CP or SOB hard to understand due to dysarthria  Objective: Vital signs in last 24 hours: Temp:  [98.5 F (36.9 C)-98.6 F (37 C)] 98.6 F (37 C) (08/22 0530) Pulse Rate:  [53-79] 59 (08/22 0530) Resp:  [16-18] 18 (08/22 0530) BP: (101-141)/(65-83) 124/75 mmHg (08/22 0530) SpO2:  [92 %-96 %] 92 % (08/22 0530) Weight:  [180 lb 8 oz (81.874 kg)] 180 lb 8 oz (81.874 kg) (08/22 0530) Last BM Date: 12/10/13  Intake/Output from previous day: 08/21 0701 - 08/22 0700 In: 250 [P.O.:250] Out: 1200 [Urine:1200] Intake/Output this shift:    Medications Current Facility-Administered Medications  Medication Dose Route Frequency Provider Last Rate Last Dose  . acetaminophen (TYLENOL) tablet 650 mg  650 mg Oral Q4H PRN Areta Haber Dunn, PA-C      . aspirin EC tablet 81 mg  81 mg Oral Daily Darlin Coco, MD   81 mg at 12/10/13 1111  . atenolol (TENORMIN) tablet 12.5 mg  12.5 mg Oral BID Areta Haber Dunn, PA-C   12.5 mg at 12/10/13 1112  . atorvastatin (LIPITOR) tablet 80 mg  80 mg Oral q1800 Areta Haber Dunn, PA-C   80 mg at 12/10/13 1751  . clopidogrel (PLAVIX) tablet 75 mg  75 mg Oral Daily Ryan M Dunn, PA-C   75 mg at 12/10/13 1118  . enoxaparin (LOVENOX) injection 40 mg  40 mg Subcutaneous Q24H Areta Haber Dunn, PA-C   40 mg at 12/10/13 2229  . isosorbide mononitrate (IMDUR) 24 hr tablet 30 mg  30 mg Oral Daily Ryan M Dunn, PA-C   30 mg at 12/10/13 1112  . nitroGLYCERIN (NITROSTAT) SL tablet 0.4 mg  0.4 mg Sublingual Q5 Min x 3 PRN Areta Haber Dunn, PA-C      . ondansetron [facility]) injection 4 mg  4 mg Intravenous Q6H PRN Areta Haber Dunn, PA-C      . oxybutynin (DITROPAN) tablet 5 mg  5 mg Oral Daily Ryan M Dunn, PA-C   5 mg at 12/10/13 1112  . pantoprazole (PROTONIX) EC tablet 40 mg  40 mg Oral QAC breakfast Areta Haber Dunn, PA-C   40 mg at 12/11/13 7412  . pregabalin (LYRICA) capsule 150 mg  150 mg Oral BID Areta Haber  Dunn, PA-C   150 mg at 12/10/13 2229  . ranolazine (RANEXA) 12 hr tablet 500 mg  500 mg Oral BID Areta Haber Dunn, PA-C   500 mg at 12/10/13 2229  . sertraline (ZOLOFT) tablet 75 mg  75 mg Oral QHS Rise Mu, PA-C   75 mg at 12/10/13 2229  . trimethoprim (TRIMPEX) tablet 100 mg  100 mg Oral Daily Ryan M Dunn, PA-C   100 mg at 12/10/13 1113    PE: General appearance: alert, cooperative and no distress Lungs: clear to auscultation bilaterally Heart: regular rate and rhythm, S1, S2 normal, no murmur, click, rub or gallop Extremities: No LEE Pulses: 2+ and symmetric Skin: Warm and dry Neurologic: Chronic expressive aphasia, right sided weakness.  Lab Results:   Recent Labs  12/09/13 1158  WBC 4.7  HGB 13.6  HCT 41.4  PLT 196   BMET  Recent Labs  12/09/13 1158  NA 140  K 4.2  CL 105  CO2 23  GLUCOSE 104*  BUN 17  CREATININE 0.72  CALCIUM 9.2     Assessment/Plan   Principal Problem:  Chest pain with moderate risk for cardiac etiology Active Problems:   CVA 2001   Spastic hemiplegia affecting dominant side   Aphasia as late effect of stroke   CAD- hx of two stents ? 2010 at [facility]   Dyslipidemia   Abnormal EKG  Chest Pain:  Echo normal EF 60%  Myovue high risk  There is a large reversible perfusion defect at the apex  extending into the septum and inferior walls. At the lateral apex, there is a small fixed defect.  Dr Marlou Porch has arranged Cath for Monday  Orders written  Continue ASA and Plavix  Add heparin if any further pain    LOS: 2 days    Jenkins Rouge PA-C 12/11/2013 8:27 AM

## 2013-12-12 MED ORDER — SODIUM CHLORIDE 0.9 % IJ SOLN: 3.0000 mL | Freq: Two times a day (BID) | INTRAMUSCULAR | Status: DC

## 2013-12-12 MED ORDER — SODIUM CHLORIDE 0.9 % IV SOLN
250.0000 mL | INTRAVENOUS | Status: DC | PRN
Start: 2013-12-12 — End: 2013-12-13

## 2013-12-12 MED ORDER — SODIUM CHLORIDE 0.9 % IJ SOLN: 3.0000 mL | INTRAMUSCULAR | Status: DC | PRN

## 2013-12-12 MED ORDER — ASPIRIN 81 MG PO CHEW
81.0000 mg | CHEWABLE_TABLET | ORAL | Status: AC
  Administered 2013-12-13: 81 mg via ORAL
  Filled 2013-12-12: qty 1

## 2013-12-12 MED ORDER — SODIUM CHLORIDE 0.9 % IV SOLN
1.0000 mL/kg/h | INTRAVENOUS | Status: DC
  Administered 2013-12-12: 1 mL/kg/h via INTRAVENOUS

## 2013-12-12 NOTE — Progress Notes (Signed)
Patient ID: Kristin [name], female   DOB: Aug 03, 1940, 73 y.o.   MRN: [id number]    Subjective: No apparent CP or SOB hard to understand due to dysarthria  Objective: Vital signs in last 24 hours: Temp:  [97.6 F (36.4 C)-98.2 F (36.8 C)] 98.2 F (36.8 C) (08/23 0547) Pulse Rate:  [51-57] 51 (08/23 0547) Resp:  [17-18] 18 (08/23 0547) BP: (113-142)/(70-72) 142/70 mmHg (08/23 0547) SpO2:  [95 %-98 %] 96 % (08/23 0547) Weight:  [180 lb 8.9 oz (81.9 kg)] 180 lb 8.9 oz (81.9 kg) (08/23 0547) Last BM Date: 12/11/13  Intake/Output from previous day: 08/22 0701 - 08/23 0700 In: 360 [P.O.:360] Out: 1200 [Urine:1200] Intake/Output this shift: Total I/O In: 120 [P.O.:120] Out: -   Medications Current Facility-Administered Medications  Medication Dose Route Frequency Provider Last Rate Last Dose  . acetaminophen (TYLENOL) tablet 650 mg  650 mg Oral Q4H PRN Areta Haber Dunn, PA-C      . aspirin EC tablet 81 mg  81 mg Oral Daily Darlin Coco, MD   81 mg at 12/11/13 1027  . atenolol (TENORMIN) tablet 12.5 mg  12.5 mg Oral BID Areta Haber Dunn, PA-C   12.5 mg at 12/11/13 2221  . atorvastatin (LIPITOR) tablet 80 mg  80 mg Oral q1800 Areta Haber Dunn, PA-C   80 mg at 12/11/13 1703  . clopidogrel (PLAVIX) tablet 75 mg  75 mg Oral Daily Ryan M Dunn, PA-C   75 mg at 12/11/13 1029  . enoxaparin (LOVENOX) injection 40 mg  40 mg Subcutaneous Q24H Areta Haber Dunn, PA-C   40 mg at 12/11/13 2024  . isosorbide mononitrate (IMDUR) 24 hr tablet 30 mg  30 mg Oral Daily Ryan M Dunn, PA-C   30 mg at 12/11/13 1027  . nitroGLYCERIN (NITROSTAT) SL tablet 0.4 mg  0.4 mg Sublingual Q5 Min x 3 PRN Areta Haber Dunn, PA-C      . ondansetron [facility]) injection 4 mg  4 mg Intravenous Q6H PRN Areta Haber Dunn, PA-C      . oxybutynin (DITROPAN) tablet 5 mg  5 mg Oral Daily Ryan M Dunn, PA-C   5 mg at 12/11/13 1027  . pantoprazole (PROTONIX) EC tablet 40 mg  40 mg Oral QAC breakfast Areta Haber Dunn, PA-C   40 mg at 12/12/13 7209  . pregabalin (LYRICA)  capsule 150 mg  150 mg Oral BID Areta Haber Dunn, PA-C   150 mg at 12/11/13 2221  . ranolazine (RANEXA) 12 hr tablet 500 mg  500 mg Oral BID Areta Haber Dunn, PA-C   500 mg at 12/11/13 2221  . sertraline (ZOLOFT) tablet 75 mg  75 mg Oral QHS Rise Mu, PA-C   75 mg at 12/11/13 2221  . trimethoprim (TRIMPEX) tablet 100 mg  100 mg Oral Daily Ryan M Dunn, PA-C   100 mg at 12/11/13 1027    PE: General appearance: alert, cooperative and no distress Lungs: clear to auscultation bilaterally Heart: regular rate and rhythm, S1, S2 normal, no murmur, click, rub or gallop Extremities: No LEE Pulses: 2+ and symmetric Skin: Warm and dry Neurologic: Chronic expressive aphasia, right sided weakness.  Lab Results:   Recent Labs  12/09/13 1158  WBC 4.7  HGB 13.6  HCT 41.4  PLT 196   BMET  Recent Labs  12/09/13 1158  NA 140  K 4.2  CL 105  CO2 23  GLUCOSE 104*  BUN 17  CREATININE 0.72  CALCIUM 9.2  Assessment/Plan   Principal Problem:   Chest pain with moderate risk for cardiac etiology Active Problems:   CVA 2001   Spastic hemiplegia affecting dominant side   Aphasia as late effect of stroke   CAD- hx of two stents ? 2010 at [facility]   Dyslipidemia   Abnormal EKG  Chest Pain:  Echo normal EF 60%  Myovue high risk  There is a large reversible perfusion defect at the apex  extending into the septum and inferior walls. At the lateral apex, there is a small fixed defect.  Dr Marlou Porch has arranged Cath for Monday  Orders written  Continue ASA and Plavix  Add heparin if any further pain  Has RUE contracture from stroke would use left radial or RFA    LOS: 3 days    Jenkins Rouge PA-C 12/12/2013 8:58 AM

## 2013-12-12 NOTE — Plan of Care (Signed)
Problem: Phase II Progression Outcomes Goal: Other Phase II Outcomes/Goals Outcome: Completed/Met Date Met:  12/12/13 Patient given handout on Cardiac Cath     

## 2013-12-12 NOTE — Progress Notes (Signed)
Utilization Review Completed.   Tysheena Ginzburg, RN, BSN Nurse Case Manager  

## 2013-12-13 ENCOUNTER — Other Ambulatory Visit: Payer: Self-pay

## 2013-12-13 ENCOUNTER — Encounter (HOSPITAL_COMMUNITY): Payer: Self-pay | Admitting: Physician Assistant

## 2013-12-13 ENCOUNTER — Encounter (HOSPITAL_COMMUNITY)
Admission: EM | Disposition: A | Discharge status: Skilled Nursing Facility | Payer: Self-pay | Source: Home / Self Care | Attending: Cardiology

## 2013-12-13 ENCOUNTER — Inpatient Hospital Stay (HOSPITAL_COMMUNITY): Payer: Medicare PPO

## 2013-12-13 DIAGNOSIS — I69920 Aphasia following unspecified cerebrovascular disease: Secondary | ICD-10-CM | POA: Diagnosis not present

## 2013-12-13 DIAGNOSIS — Z79899 Other long term (current) drug therapy: Secondary | ICD-10-CM | POA: Diagnosis not present

## 2013-12-13 DIAGNOSIS — I69959 Hemiplegia and hemiparesis following unspecified cerebrovascular disease affecting unspecified side: Secondary | ICD-10-CM | POA: Diagnosis not present

## 2013-12-13 DIAGNOSIS — Z7902 Long term (current) use of antithrombotics/antiplatelets: Secondary | ICD-10-CM | POA: Diagnosis not present

## 2013-12-13 DIAGNOSIS — K219 Gastro-esophageal reflux disease without esophagitis: Secondary | ICD-10-CM | POA: Diagnosis present

## 2013-12-13 DIAGNOSIS — I10 Essential (primary) hypertension: Secondary | ICD-10-CM | POA: Diagnosis present

## 2013-12-13 DIAGNOSIS — I2510 Atherosclerotic heart disease of native coronary artery without angina pectoris: Secondary | ICD-10-CM

## 2013-12-13 DIAGNOSIS — Z7982 Long term (current) use of aspirin: Secondary | ICD-10-CM | POA: Diagnosis not present

## 2013-12-13 DIAGNOSIS — I200 Unstable angina: Secondary | ICD-10-CM | POA: Diagnosis present

## 2013-12-13 DIAGNOSIS — E8779 Other fluid overload: Secondary | ICD-10-CM | POA: Diagnosis not present

## 2013-12-13 DIAGNOSIS — J9819 Other pulmonary collapse: Secondary | ICD-10-CM | POA: Diagnosis not present

## 2013-12-13 DIAGNOSIS — J988 Other specified respiratory disorders: Secondary | ICD-10-CM | POA: Diagnosis not present

## 2013-12-13 DIAGNOSIS — D62 Acute posthemorrhagic anemia: Secondary | ICD-10-CM | POA: Diagnosis not present

## 2013-12-13 DIAGNOSIS — Y832 Surgical operation with anastomosis, bypass or graft as the cause of abnormal reaction of the patient, or of later complication, without mention of misadventure at the time of the procedure: Secondary | ICD-10-CM | POA: Diagnosis not present

## 2013-12-13 DIAGNOSIS — R079 Chest pain, unspecified: Secondary | ICD-10-CM | POA: Diagnosis present

## 2013-12-13 DIAGNOSIS — E785 Hyperlipidemia, unspecified: Secondary | ICD-10-CM | POA: Diagnosis present

## 2013-12-13 DIAGNOSIS — I25118 Atherosclerotic heart disease of native coronary artery with other forms of angina pectoris: Secondary | ICD-10-CM

## 2013-12-13 DIAGNOSIS — Y9210 Unspecified residential institution as the place of occurrence of the external cause: Secondary | ICD-10-CM | POA: Diagnosis not present

## 2013-12-13 DIAGNOSIS — Z9861 Coronary angioplasty status: Secondary | ICD-10-CM | POA: Diagnosis not present

## 2013-12-13 DIAGNOSIS — I498 Other specified cardiac arrhythmias: Secondary | ICD-10-CM | POA: Diagnosis present

## 2013-12-13 HISTORY — PX: LEFT HEART CATHETERIZATION WITH CORONARY ANGIOGRAM: SHX5451

## 2013-12-13 LAB — PULMONARY FUNCTION TEST
FEF 25-75 Post: 0.83 L/sec
FEF 25-75 Pre: 0.59 L/sec
FEF2575-%Change-Post: 40 %
FEF2575-%Pred-Post: 46 %
FEF2575-%Pred-Pre: 32 %
FEV1-%Change-Post: 14 %
FEV1-%Pred-Post: 55 %
FEV1-%Pred-Pre: 48 %
FEV1-Post: 1.33 L
FEV1-Pre: 1.16 L
FEV1FVC-%Change-Post: 13 %
FEV1FVC-%Pred-Pre: 81 %
FEV6-%Change-Post: 3 %
FEV6-%Pred-Post: 63 %
FEV6-%Pred-Pre: 61 %
FEV6-Post: 1.91 L
FEV6-Pre: 1.85 L
FEV6FVC-%Change-Post: 1 %
FEV6FVC-%Pred-Post: 105 %
FEV6FVC-%Pred-Pre: 103 %
FVC-%Change-Post: 1 %
FVC-%Pred-Post: 60 %
FVC-%Pred-Pre: 59 %
FVC-Post: 1.91 L
Post FEV1/FVC ratio: 70 %
Post FEV6/FVC ratio: 100 %
Pre FEV1/FVC ratio: 61 %
Pre FEV6/FVC Ratio: 98 %

## 2013-12-13 LAB — PROTIME-INR
INR: 0.97 (ref 0.00–1.49)
Prothrombin Time: 12.9 seconds (ref 11.6–15.2)

## 2013-12-13 LAB — HEPARIN LEVEL (UNFRACTIONATED): Heparin Unfractionated: 0.32 IU/mL (ref 0.30–0.70)

## 2013-12-13 SURGERY — LEFT HEART CATHETERIZATION WITH CORONARY ANGIOGRAM
Anesthesia: LOCAL

## 2013-12-13 MED ORDER — VERAPAMIL HCL 2.5 MG/ML IV SOLN
INTRAVENOUS | Status: AC
  Filled 2013-12-13: qty 2

## 2013-12-13 MED ORDER — HEPARIN (PORCINE) IN NACL 2-0.9 UNIT/ML-% IJ SOLN
INTRAMUSCULAR | Status: AC
  Filled 2013-12-13: qty 1000

## 2013-12-13 MED ORDER — FENTANYL CITRATE 0.05 MG/ML IJ SOLN
INTRAMUSCULAR | Status: AC
  Filled 2013-12-13: qty 2

## 2013-12-13 MED ORDER — ALBUTEROL SULFATE (2.5 MG/3ML) 0.083% IN NEBU
2.5000 mg | INHALATION_SOLUTION | Freq: Once | RESPIRATORY_TRACT | Status: AC
  Administered 2013-12-13: 2.5 mg via RESPIRATORY_TRACT

## 2013-12-13 MED ORDER — HEPARIN (PORCINE) IN NACL 100-0.45 UNIT/ML-% IJ SOLN
950.0000 [IU]/h | INTRAMUSCULAR | Status: DC
  Administered 2013-12-13 – 2013-12-15 (×3): 950 [IU]/h via INTRAVENOUS
  Filled 2013-12-13 (×7): qty 250

## 2013-12-13 MED ORDER — SODIUM CHLORIDE 0.9 % IV SOLN
1.0000 mL/kg/h | INTRAVENOUS | Status: AC
  Administered 2013-12-13: 1 mL/kg/h via INTRAVENOUS

## 2013-12-13 MED ORDER — HEPARIN (PORCINE) IN NACL 2-0.9 UNIT/ML-% IJ SOLN
INTRAMUSCULAR | Status: AC
  Filled 2013-12-13: qty 500

## 2013-12-13 MED ORDER — NITROGLYCERIN 1 MG/10 ML FOR IR/CATH LAB
INTRA_ARTERIAL | Status: AC
  Filled 2013-12-13: qty 10

## 2013-12-13 MED ORDER — ACETAMINOPHEN 325 MG PO TABS: 650.0000 mg | ORAL_TABLET | ORAL | Status: DC | PRN

## 2013-12-13 MED ORDER — ONDANSETRON HCL 4 MG/2ML IJ SOLN: 4.0000 mg | Freq: Four times a day (QID) | INTRAMUSCULAR | Status: DC | PRN

## 2013-12-13 MED ORDER — ASPIRIN 81 MG PO CHEW
81.0000 mg | CHEWABLE_TABLET | Freq: Every day | ORAL | Status: DC
  Administered 2013-12-14 – 2013-12-16 (×3): 81 mg via ORAL
  Filled 2013-12-13 (×3): qty 1

## 2013-12-13 MED ORDER — MIDAZOLAM HCL 2 MG/2ML IJ SOLN
INTRAMUSCULAR | Status: AC
  Filled 2013-12-13: qty 2

## 2013-12-13 MED ORDER — LIDOCAINE HCL (PF) 1 % IJ SOLN
INTRAMUSCULAR | Status: AC
  Filled 2013-12-13: qty 30

## 2013-12-13 NOTE — CV Procedure (Signed)
    PROCEDURE:  Left heart catheterization with selective coronary angiography.  INDICATIONS:  High-risk abnormal stress test. Unstable angina  The risks, benefits, and details of the procedure were explained to the patient.  The patient verbalized understanding and wanted to proceed.  Informed written consent was obtained.  PROCEDURE TECHNIQUE:  The left wrist was prepped and draped in usual sterile fashion. Local anesthesia was administered. Several attempts were made to access the left radial artery. The artery was accessed but a wire would not pass. A bandage was placed and attention was turned to the right groin. After Xylocaine anesthesia a 30F sheath was placed in the right femoral artery with a single anterior needle wall stick.   Left coronary angiography was done using a Judkins L4 guide catheter.  Right coronary angiography was done using a Judkins R4 guide catheter.  Left heart catheterization was done using the JR 4 catheter.    CONTRAST:  Total of 35 cc.  COMPLICATIONS:  None.    HEMODYNAMICS:  Aortic pressure was 128/69; LV pressure was 124/1; LVEDP 9.  There was no gradient between the left ventricle and aorta.    ANGIOGRAPHIC DATA:   The left main coronary artery is widely patent proximally. There is minimal distal disease.  The left anterior descending artery is a large vessel proximally. There is an ostial 95% stenosis. Remainder of the LAD has moderate disease. The vessel appears somewhat underfilled. There are left to right collaterals noted as well. There is a long first diagonal. It appears somewhat underfilled. There is a moderate, proximal stenosis in the diagonal.  The left circumflex artery is a large vessel. There is mild disease proximally. There is a first obtuse marginal which is medium-sized and patent. The second obtuse marginal is very large and branches across the lateral wall. This vessel has only minimal disease. The remainder of the circumflex is large and  widely patent.  The right coronary artery is a large dominant vessel. There is a stent in the proximal vessel which is widely patent. After a large RV marginal branch, there is a long segment of severe disease in the mid to distal RCA, up to 95%. There is a stent in the distal RCA which appears patent. After the distal stent, a large posterior descending artery is noted. There is bidirectional flow in the posterior descending artery, likely from the left sided collaterals. The posterior lateral artery appears occluded. There are some collaterals from the RV marginal branch which appeared to go towards the LAD system.  LEFT VENTRICULOGRAM:  Left ventricular angiogram was not done.  LVEDP was 9 mmHg.  IMPRESSIONS:  1. Minimal distal left main coronary artery disease. 2. Severe disease in the ostial left anterior descending artery. There is moderate diffuse disease in the proximal to mid vessel. 3. Mild disease in the left circumflex artery and its branches. 4. Patent stents in the proximal and distal right coronary artery. Severe disease in a long segment of the mid to distal RCA. 5. LVEDP 9 mmHg.  Ejection fraction not assessed, normal by nuclear study.  RECOMMENDATION:  Will obtain cardiac surgery evaluation. Her ostial LAD disease it is not favorable for PCI since the large circumflex vessel would have to be jailed. There is a long segment of disease in the RCA as well. We'll move the patient to step down. I've communicated with the family. Will hold clopidogrel at this point. Will start heparin 6 hours after the sheath pull.

## 2013-12-13 NOTE — Care Management Note (Unsigned)
    Page 1 of 1   12/21/2013     2:17:29 PM CARE MANAGEMENT NOTE 12/21/2013  Patient:  Kristin [name], Kristin [name]   Account Number:  [id number]  Date Initiated:  12/13/2013  Documentation initiated by:  Elissa Hefty  Subjective/Objective Assessment:   adm w pos card cath     Action/Plan:   lives w husband, pcp dr Leanna Battles   Anticipated DC Date:  12/23/2013   Anticipated DC Plan:  [place] referral  Clinical Social Worker      DC Planning Services  CM consult      Choice offered to / List presented to:             Status of service:  In process, will continue to follow Medicare Important Message given?  YES (If response is "NO", the following Medicare IM given date fields will be blank) Date Medicare IM given:  12/13/2013 Medicare IM given by:  Elissa Hefty Date Additional Medicare IM given:  12/21/2013 Additional Medicare IM given by:  Yostin Malacara  Discharge Disposition:  [place]  Per UR Regulation:  Reviewed for med. necessity/level of care/duration of stay  If discussed at [place] of Stay Meetings, dates discussed:   12/14/2013  12/16/2013  12/21/2013    Comments:  12/21/13 Ellan Lambert, RN, BSN [phone] PT recommending SNF at dc; insurance will not cover CIR stay for pt's diagnosis.  CSW consulted to facilitate dc to SNF for short term rehab when medically stable for dc.  12/17/13 [place] RN MSN BSN CCM Xferred to 2S s/p CABG, on vent, NTG gtt.

## 2013-12-13 NOTE — Progress Notes (Signed)
ANTICOAGULATION CONSULT NOTE - Follow Up Consult  Pharmacy Consult for heparin Indication: CAD awaiting CABG  Labs:  Recent Labs  12/13/13 0401 12/13/13 2255  LABPROT 12.9  --   INR 0.97  --   HEPARINUNFRC  --  0.32    Assessment/Plan:  73yo female therapeutic on heparin with initial dosing s/p cath, now awaiting CABG. Will continue gtt at current rate and confirm stable with am labs.   Wynona Neat, PharmD, BCPS  12/13/2013,11:46 PM

## 2013-12-13 NOTE — Interval H&P Note (Signed)
Cath Lab Visit (complete for each Cath Lab visit)  Clinical Evaluation Leading to the Procedure:   ACS: No.  Non-ACS:    Anginal Classification: CCS IV  Anti-ischemic medical therapy: Maximal Therapy (2 or more classes of medications)  Non-Invasive Test Results: High-risk stress test findings: cardiac mortality >3%/year  Prior CABG: No previous CABG      History and Physical Interval Note:  12/13/2013 7:49 AM  Kristin [name]  has presented today for surgery, with the diagnosis of cp  The various methods of treatment have been discussed with the patient and family. After consideration of risks, benefits and other options for treatment, the patient has consented to  Procedure(s): LEFT HEART CATHETERIZATION WITH CORONARY ANGIOGRAM (N/A) as a surgical intervention .  The patient's history has been reviewed, patient examined, no change in status, stable for surgery.  I have reviewed the patient's chart and labs.  Questions were answered to the patient's satisfaction.     Manal Kreutzer S.

## 2013-12-13 NOTE — H&P (View-Only) (Signed)
Patient ID: Kristin [name], female   DOB: December 14, 1940, 73 y.o.   MRN: [id number]    Subjective: No apparent CP or SOB hard to understand due to dysarthria  Objective: Vital signs in last 24 hours: Temp:  [97.6 F (36.4 C)-98.2 F (36.8 C)] 98.2 F (36.8 C) (08/23 0547) Pulse Rate:  [51-57] 51 (08/23 0547) Resp:  [17-18] 18 (08/23 0547) BP: (113-142)/(70-72) 142/70 mmHg (08/23 0547) SpO2:  [95 %-98 %] 96 % (08/23 0547) Weight:  [180 lb 8.9 oz (81.9 kg)] 180 lb 8.9 oz (81.9 kg) (08/23 0547) Last BM Date: 12/11/13  Intake/Output from previous day: 08/22 0701 - 08/23 0700 In: 360 [P.O.:360] Out: 1200 [Urine:1200] Intake/Output this shift: Total I/O In: 120 [P.O.:120] Out: -   Medications Current Facility-Administered Medications  Medication Dose Route Frequency Provider Last Rate Last Dose  . acetaminophen (TYLENOL) tablet 650 mg  650 mg Oral Q4H PRN Areta Haber Dunn, PA-C      . aspirin EC tablet 81 mg  81 mg Oral Daily Darlin Coco, MD   81 mg at 12/11/13 1027  . atenolol (TENORMIN) tablet 12.5 mg  12.5 mg Oral BID Areta Haber Dunn, PA-C   12.5 mg at 12/11/13 2221  . atorvastatin (LIPITOR) tablet 80 mg  80 mg Oral q1800 Areta Haber Dunn, PA-C   80 mg at 12/11/13 1703  . clopidogrel (PLAVIX) tablet 75 mg  75 mg Oral Daily Ryan M Dunn, PA-C   75 mg at 12/11/13 1029  . enoxaparin (LOVENOX) injection 40 mg  40 mg Subcutaneous Q24H Areta Haber Dunn, PA-C   40 mg at 12/11/13 2024  . isosorbide mononitrate (IMDUR) 24 hr tablet 30 mg  30 mg Oral Daily Ryan M Dunn, PA-C   30 mg at 12/11/13 1027  . nitroGLYCERIN (NITROSTAT) SL tablet 0.4 mg  0.4 mg Sublingual Q5 Min x 3 PRN Areta Haber Dunn, PA-C      . ondansetron [facility]) injection 4 mg  4 mg Intravenous Q6H PRN Areta Haber Dunn, PA-C      . oxybutynin (DITROPAN) tablet 5 mg  5 mg Oral Daily Ryan M Dunn, PA-C   5 mg at 12/11/13 1027  . pantoprazole (PROTONIX) EC tablet 40 mg  40 mg Oral QAC breakfast Areta Haber Dunn, PA-C   40 mg at 12/12/13 9767  . pregabalin (LYRICA)  capsule 150 mg  150 mg Oral BID Areta Haber Dunn, PA-C   150 mg at 12/11/13 2221  . ranolazine (RANEXA) 12 hr tablet 500 mg  500 mg Oral BID Areta Haber Dunn, PA-C   500 mg at 12/11/13 2221  . sertraline (ZOLOFT) tablet 75 mg  75 mg Oral QHS Rise Mu, PA-C   75 mg at 12/11/13 2221  . trimethoprim (TRIMPEX) tablet 100 mg  100 mg Oral Daily Ryan M Dunn, PA-C   100 mg at 12/11/13 1027    PE: General appearance: alert, cooperative and no distress Lungs: clear to auscultation bilaterally Heart: regular rate and rhythm, S1, S2 normal, no murmur, click, rub or gallop Extremities: No LEE Pulses: 2+ and symmetric Skin: Warm and dry Neurologic: Chronic expressive aphasia, right sided weakness.  Lab Results:   Recent Labs  12/09/13 1158  WBC 4.7  HGB 13.6  HCT 41.4  PLT 196   BMET  Recent Labs  12/09/13 1158  NA 140  K 4.2  CL 105  CO2 23  GLUCOSE 104*  BUN 17  CREATININE 0.72  CALCIUM 9.2  Assessment/Plan   Principal Problem:   Chest pain with moderate risk for cardiac etiology Active Problems:   CVA 2001   Spastic hemiplegia affecting dominant side   Aphasia as late effect of stroke   CAD- hx of two stents ? 2010 at [facility]   Dyslipidemia   Abnormal EKG  Chest Pain:  Echo normal EF 60%  Myovue high risk  There is a large reversible perfusion defect at the apex  extending into the septum and inferior walls. At the lateral apex, there is a small fixed defect.  Dr Marlou Porch has arranged Cath for Monday  Orders written  Continue ASA and Plavix  Add heparin if any further pain  Has RUE contracture from stroke would use left radial or RFA    LOS: 3 days    Jenkins Rouge PA-C 12/12/2013 8:58 AM

## 2013-12-13 NOTE — Progress Notes (Signed)
Site area: rt groin Site Prior to Removal:  Level 0 Pressure Applied For: 20 minutes Manual:   yes Patient Status During Pull:  stable Post Pull Site:  Level 0 Post Pull Instructions Given:  yes Post Pull Pulses Present: yes Dressing Applied:  tegaderm dressing Bedrest begins @ 0915 Comments: no complications

## 2013-12-13 NOTE — Consult Note (Signed)
ANTICOAGULATION CONSULT NOTE - Initial Consult  Pharmacy Consult for Heparin Indication: severe LAD artery disease  No Known Allergies  Patient Measurements: Height: 5' 6.93" (170 cm) Weight: 180 lb 1.9 oz (81.7 kg) IBW/kg (Calculated) : 61.44 Heparin Dosing Weight: 78kg  Vital Signs: Temp: 97.5 F (36.4 C) (08/24 1024) Temp src: Oral (08/24 1024) BP: 152/92 mmHg (08/24 1230) Pulse Rate: 58 (08/24 1230)  Labs:  Recent Labs  12/13/13 0401  LABPROT 12.9  INR 0.97    Estimated Creatinine Clearance: 66.7 ml/min (by C-G formula based on Cr of 0.72).   Medical History: Past Medical History  Diagnosis Date  . Stroke 2001    reesultant Rt spastic hemplegia and aphasia   . Hypertension   . CAD (coronary artery disease)     a. 2010: stents pRCA and dRCA. b. LHC 12/13/13: Minimal distal LM dz. LAD 95% ostial w/ L-->R collat. LCx mild dz. RCA 95% mid-dRCA. Patent stents proximal and distal RCA. EF not assessed, Nl by nuclear study.     . Ear pain     right  . Hyperlipidemia   . Central retinal vein occlusion, right eye   . Diplopia   . Eosinophilic esophagitis   . Incontinence     urine  . GERD (gastroesophageal reflux disease)   . Arthritis     hands   Assessment: 73yof who presented with epigastric/substernal pain. Troponins negative so she underwent a stress test which turned out to be high risk. She then had a cath today and was found to have severe LAD artery disease. CVTS consult pending. She will begin IV heparin (to begin 6 hours after sheath removed, sheath removed at 0915).  Goal of Therapy:  Heparin level 0.3-0.7 units/ml Monitor platelets by anticoagulation protocol: Yes   Plan:  1) At 1515, begin heparin at 950 units/hr with NO bolus 2) Check 8 hour heparin level 3) Daily heparin level and CBC 4) Follow up CVTS consult  Deboraha Sprang 12/13/2013,12:55 PM

## 2013-12-13 NOTE — Progress Notes (Signed)
Patient: Kristin [name] / Admit Date: 12/09/2013 / Date of Encounter: 12/13/2013, 8:36 AM   Subjective: She shakes her head "no" when asked of any chest pain or SOB. She is difficult to understand 2/2 dysarthria.    Objective: Telemetry: sinus bradycardia, 50s Physical Exam: Blood pressure 122/61, pulse 52, temperature 97.9 F (36.6 C), temperature source Oral, resp. rate 18, weight 180 lb 1.9 oz (81.7 kg), SpO2 95.00%. General: Well developed, well nourished, in no acute distress. Head: Normocephalic, atraumatic, sclera non-icteric, no xanthomas, nares are without discharge. Neck: Negative for carotid bruits. JVP not elevated. Lungs: Clear bilaterally to auscultation without wheezes, rales, or rhonchi. Breathing is unlabored. Heart: RRR S1 S2 without murmurs, rubs, or gallops.  Abdomen: Soft, non-tender, non-distended with normoactive bowel sounds. No rebound/guarding. Extremities: No clubbing or cyanosis. No edema. Distal pedal pulses are 2+ and equal bilaterally. Neuro: Alert and oriented X 3. Moves all extremities spontaneously. Psych:  Responds to questions appropriately with a normal affect.   Intake/Output Summary (Last 24 hours) at 12/13/13 0836 Last data filed at 12/13/13 0600  Gross per 24 hour  Intake 1009.91 ml  Output   1275 ml  Net -265.09 ml    Inpatient Medications:  . [facility] HOLD] aspirin EC  81 mg Oral Daily  . [facility] HOLD] atenolol  12.5 mg Oral BID  . [facility] HOLD] atorvastatin  80 mg Oral q1800  . [facility] HOLD] clopidogrel  75 mg Oral Daily  . [[facility] HOLD] enoxaparin (LOVENOX) injection  40 mg Subcutaneous Q24H  . [[facility] HOLD] isosorbide mononitrate  30 mg Oral Daily  . [facility] HOLD] oxybutynin  5 mg Oral Daily  . [[facility] HOLD] pantoprazole  40 mg Oral QAC breakfast  . [[facility] HOLD] pregabalin  150 mg Oral BID  . [facility] HOLD] ranolazine  500 mg Oral BID  . [facility] HOLD] sertraline  75 mg Oral QHS  . sodium chloride  3 mL Intravenous Q12H  . [facility] HOLD] trimethoprim  100 mg Oral  Daily   Infusions:  . sodium chloride 1 mL/kg/hr (12/12/13 2113)    Labs: No results found for this basename: NA, K, CL, CO2, GLUCOSE, BUN, CREATININE, CALCIUM, MG, PHOS,  in the last 72 hours No results found for this basename: AST, ALT, ALKPHOS, BILITOT, PROT, ALBUMIN,  in the last 72 hours No results found for this basename: WBC, NEUTROABS, HGB, HCT, MCV, PLT,  in the last 72 hours No results found for this basename: CKTOTAL, CKMB, TROPONINI,  in the last 72 hours No components found with this basename: POCBNP,  No results found for this basename: HGBA1C,  in the last 72 hours   Radiology/Studies:  Nm Myocar Multi W/spect W/wall Motion / Ef  12/10/2013   CLINICAL DATA:  Chest pain  EXAM: MYOCARDIAL IMAGING WITH SPECT (REST AND PHARMACOLOGIC-STRESS)  GATED LEFT VENTRICULAR WALL MOTION STUDY  LEFT VENTRICULAR EJECTION FRACTION  TECHNIQUE: Standard myocardial SPECT imaging was performed after resting intravenous injection of 10 mCi Tc-34m sestamibi. Subsequently, intravenous infusion of Lexiscan was performed under the supervision of the Cardiology staff. At peak effect of the drug, 30 mCi Tc-47m sestamibi was injected intravenously and standard myocardial SPECT imaging was performed. Quantitative gated imaging was also performed to evaluate left ventricular wall motion, and estimate left ventricular ejection fraction.  COMPARISON:  None.  FINDINGS: Perfusion: There is a large reversible perfusion defect at the apex extending into the septum and inferior walls. At the lateral apex, there is a small fixed defect.  Wall Motion: Septal hypokinesis.  Left Ventricular Ejection Fraction: 60 %  End diastolic volume 88 ml  End systolic volume 36 ml  IMPRESSION: 1. The study is positive for stress-induced ischemia in the apex extending into the septum and inferior walls.  2. Septal hypokinesis.  3. Left ventricular ejection fraction 60%  4. High-risk stress test findings*.  *2012 Appropriate Use Criteria for  Coronary Revascularization Focused Update: J Am Coll Cardiol. 1937;90(2):409-735. http://content.airportbarriers.com.aspx?articleid=1201161   Electronically Signed   By: Maryclare Bean M.D.   On: 12/10/2013 12:45   Dg Chest Port 1 View  12/09/2013   CLINICAL DATA:  Chest pain.  EXAM: PORTABLE CHEST - 1 VIEW  COMPARISON:  None.  FINDINGS: Mild cardiomegaly is noted. Both lungs are clear. No pneumothorax or pleural effusion is noted. The visualized skeletal structures are unremarkable.  IMPRESSION: No acute cardiopulmonary abnormality seen.   Electronically Signed   By: Sabino Dick M.D.   On: 12/09/2013 12:15   Cardiac catheterization 12/13/2013:  PROCEDURE: Left heart catheterization with selective coronary angiography.  INDICATIONS: High-risk abnormal stress test. Unstable angina  The risks, benefits, and details of the procedure were explained to the patient. The patient verbalized understanding and wanted to proceed. Informed written consent was obtained.  PROCEDURE TECHNIQUE: The left wrist was prepped and draped in usual sterile fashion. Local anesthesia was administered. Several attempts were made to access the left radial artery. The artery was accessed but a wire would not pass. A bandage was placed and attention was turned to the right groin. After Xylocaine anesthesia a 106F sheath was placed in the right femoral artery with a single anterior needle wall stick. Left coronary angiography was done using a Judkins L4 guide catheter. Right coronary angiography was done using a Judkins R4 guide catheter. Left heart catheterization was done using the JR 4 catheter.  CONTRAST: Total of 35 cc.  COMPLICATIONS: None.  HEMODYNAMICS: Aortic pressure was 128/69; LV pressure was 124/1; LVEDP 9. There was no gradient between the left ventricle and aorta.  ANGIOGRAPHIC DATA: The left main coronary artery is widely patent proximally. There is minimal distal disease.  The left anterior descending artery is a large  vessel proximally. There is an ostial 95% stenosis. Remainder of the LAD has moderate disease. The vessel appears somewhat underfilled. There are left to right collaterals noted as well. There is a long first diagonal. It appears somewhat underfilled. There is a moderate, proximal stenosis in the diagonal.  The left circumflex artery is a large vessel. There is mild disease proximally. There is a first obtuse marginal which is medium-sized and patent. The second obtuse marginal is very large and branches across the lateral wall. This vessel has only minimal disease. The remainder of the circumflex is large and widely patent.  The right coronary artery is a large dominant vessel. There is a stent in the proximal vessel which is widely patent. After a large RV marginal branch, there is a long segment of severe disease in the mid to distal RCA, up to 95%. There is a stent in the distal RCA which appears patent. After the distal stent, a large posterior descending artery is noted. There is bidirectional flow in the posterior descending artery, likely from the left sided collaterals. The posterior lateral artery appears occluded. There are some collaterals from the RV marginal branch which appeared to go towards the LAD system.  LEFT VENTRICULOGRAM: Left ventricular angiogram was not done. LVEDP was 9 mmHg.  IMPRESSIONS:  1. Minimal  distal left main coronary artery disease. 2. Severe disease in the ostial left anterior descending artery. There is moderate diffuse disease in the proximal to mid vessel. 3. Mild disease in the left circumflex artery and its branches. 4. Patent stents in the proximal and distal right coronary artery. Severe disease in a long segment of the mid to distal RCA. 5. LVEDP 9 mmHg. Ejection fraction not assessed, normal by nuclear study. RECOMMENDATION: Will obtain cardiac surgery evaluation. Her ostial LAD disease it is not favorable for PCI since the large circumflex vessel would have  to be jailed. There is a long segment of disease in the RCA as well. We'll move the patient to step down. I've communicated with the family. Will hold clopidogrel at this point. Will start heparin 6 hours after the sheath pull.     Assessment and Plan  73 y/o F with h/o CAD (hx of 2 stents ? 2010 at [place]), CVA (2001) with resultant Rt spastic hemiplegia and aphasia, HTN, HL, and GERD who presented to [facility] on 12/09/13 with sudden onset of epigastric and substernal chest pain without radiation. She has been pain free since her admission. Troponin negative x 3. NSR, 68, TWI v2-v3. Lexiscan Myoview on 8/21 high risk with stress-induced ischemia in the apex extending into the septum and inferior walls, septal HK, EF 60%. Echo nl. Scheduled for cardiac cath today.    1. Chest pain-with moderate risk for cardiac etiology:  -Cardiac cath today -Cath with severe 2 vessel disease (patent stents in the proximal and distal RCA)-cardiac surgery evaluation pending, see above for full report  -On aspirin, Plavix has been held at this point -Heparin to be started 6 hours after the sheath pull  2. CAD -H/o multiple stents placed approximately 2010 ? [facility]. This has been researched extensively by the family and they have been unable to locate medical records.   -Aspirin -B-blocker -Lipitor -Plavix has been held   3. HTN -Controlled -Continue current medications  4. HL -On Lipitor -FLP added  5. GERD -Protonix 40 mg  6. H/o CVA 2001 -Resultant right spastic hemiplegia and aphasia    Signed, Christell Faith, PA-C 12/13/2013 9:02 AM

## 2013-12-13 NOTE — Consult Note (Signed)
[address]       [place] [address]             [phone]      Cardiothoracic Surgery Consultation   Reason for Consult: Severe multi-vessel coronary disease with unstable angina Referring Physician: Dr. Casandra Doffing  Kristin [name] is an 73 y.o. female.  HPI:   The patient is a 73 year old woman with a history of hypertension, CAD s/p PCI and stenting of the RCA in 2010 in [place], carotid disease s/p stroke in 2001 with persistent expressive aphasia and right hemiparesis. History is somewhat difficult due to aphasia but her sister and husband are here to fill in the gaps. They report a history of heartburn symptoms for a while that she thought was due to reflux. Then on the morning of 8/20 she developed severe substernal and epigastric pain. She has not had cardiology follow up since moving to [place] 2 years ago. On presentation her troponin was negative and ECG showed T wave inversions in V2-V3 with no old ECG. She had a Lexiscan Myoview that showed stress induced ischemia of the apex extending into the septum and inferior walls with an EF of 60%. Cath today shows a 95% ostial LAD stenosis. The LCX is large with no significant stenosis. The RCA is a large dominant vessel with a patent proximal stent followed by a long segment 95% mid to distal stenosis. The stent in the distal RCA beyond this is patent with a large PDA with bidirectional flow. The PL is occluded.  According to family she is ambulatory and uses a rolling walker for balance.  Past Medical History  Diagnosis Date  . Stroke 2001    reesultant Rt spastic hemplegia and aphasia   . Hypertension   . CAD (coronary artery disease)     a. 2010: stents pRCA and dRCA. b. LHC 12/13/13: Minimal distal LM dz. LAD 95% ostial w/ L-->R collat. LCx mild dz. RCA 95% mid-dRCA. Patent stents proximal and distal RCA. EF not assessed, Nl by nuclear study.     . Ear pain     right  . Hyperlipidemia   . Central retinal vein  occlusion, right eye   . Diplopia   . Eosinophilic esophagitis   . Incontinence     urine  . GERD (gastroesophageal reflux disease)   . Arthritis     hands    Past Surgical History  Procedure Laterality Date  . Abdominal hysterectomy    . Carotid stent    . Lasix    . Cataracts    . Retina injection    . Esophagogastroduodenoscopy      History reviewed. No pertinent family history.  Social History:  reports that she has never smoked. She has never used smokeless tobacco. She reports that she drinks alcohol. She reports that she does not use illicit drugs.  Allergies: No Known Allergies  Medications:  I have reviewed the patient's current medications. Prior to Admission:  Prescriptions prior to admission  Medication Sig Dispense Refill  . aspirin 81 MG tablet Take 81 mg by mouth daily.      Marland Kitchen atenolol (TENORMIN) 25 MG tablet Take 12.5 mg by mouth 2 (two) times daily.      Marland Kitchen atorvastatin (LIPITOR) 40 MG tablet Take 80 mg by mouth daily at 6 PM.       . clopidogrel (PLAVIX) 75 MG tablet Take 75 mg by mouth daily.      Marland Kitchen  diclofenac sodium (VOLTAREN) 1 % GEL Apply 4 g topically 4 (four) times daily as needed (pain).       . isosorbide mononitrate (IMDUR) 30 MG 24 hr tablet Take 30 mg by mouth daily.      Marland Kitchen omeprazole (PRILOSEC) 20 MG capsule Take 20 mg by mouth daily.      Marland Kitchen oxybutynin (DITROPAN) 5 MG tablet Take 5 mg by mouth daily.      . pregabalin (LYRICA) 150 MG capsule Take 150 mg by mouth 2 (two) times daily.       . ranolazine (RANEXA) 500 MG 12 hr tablet Take 500 mg by mouth 2 (two) times daily.      . sertraline (ZOLOFT) 50 MG tablet Take 75 mg by mouth every evening.       . trimethoprim (TRIMPEX) 100 MG tablet Take 100 mg by mouth daily.       Scheduled: . aspirin  81 mg Oral Daily  . atenolol  12.5 mg Oral BID  . atorvastatin  80 mg Oral q1800  . isosorbide mononitrate  30 mg Oral Daily  . oxybutynin  5 mg Oral Daily  . pantoprazole  40 mg Oral QAC breakfast    . pregabalin  150 mg Oral BID  . ranolazine  500 mg Oral BID  . sertraline  75 mg Oral QHS  . trimethoprim  100 mg Oral Daily   Continuous: . heparin 950 Units/hr (12/13/13 1700)   YDX:AJOINOMVEHMCN, nitroGLYCERIN, ondansetron (ZOFRAN) IV Anti-infectives   Start     Dose/Rate Route Frequency Ordered Stop   12/09/13 1800  trimethoprim (TRIMPEX) tablet 100 mg     100 mg Oral Daily 12/09/13 1633        Results for orders placed during the hospital encounter of 12/09/13 (from the past 48 hour(s))  PROTIME-INR     Status: None   Collection Time    12/13/13  4:01 AM      Result Value Ref Range   Prothrombin Time 12.9  11.6 - 15.2 seconds   INR 0.97  0.00 - 1.49    No results found.  Review of Systems  Unable to perform ROS: other  expressive aphasia  Blood pressure 142/106, pulse 142, temperature 97.5 F (36.4 C), temperature source Oral, resp. rate 17, height 5' 6.93" (1.7 m), weight 81.7 kg (180 lb 1.9 oz), SpO2 89.00%. Physical Exam  Constitutional: She appears well-developed and well-nourished.  Elderly woman in no distress  HENT:  Head: Normocephalic and atraumatic.  Mouth/Throat: Oropharynx is clear and moist.  Scar on right forehead from remote melanoma resection  Eyes: EOM are normal. Pupils are equal, round, and reactive to light.  Neck:  Carotid pulses palpable bilat with no bruits.  Cardiovascular: Normal rate, regular rhythm, normal heart sounds and intact distal pulses.   No murmur heard. Respiratory: Effort normal and breath sounds normal. No respiratory distress.  Scar right breast from biopsy  GI: Soft. Bowel sounds are normal. She exhibits no distension and no mass. There is no tenderness.  Musculoskeletal: She exhibits no edema and no tenderness.  Neurological: She is alert.  Weakness right arm and leg.  Skin: Skin is warm and dry.  Psychiatric: She has a normal mood and affect.   PROCEDURE: Left heart catheterization with selective coronary  angiography.  INDICATIONS: High-risk abnormal stress test. Unstable angina  The risks, benefits, and details of the procedure were explained to the patient. The patient verbalized understanding and wanted to proceed. Informed written  consent was obtained.  PROCEDURE TECHNIQUE: The left wrist was prepped and draped in usual sterile fashion. Local anesthesia was administered. Several attempts were made to access the left radial artery. The artery was accessed but a wire would not pass. A bandage was placed and attention was turned to the right groin. After Xylocaine anesthesia a 82F sheath was placed in the right femoral artery with a single anterior needle wall stick. Left coronary angiography was done using a Judkins L4 guide catheter. Right coronary angiography was done using a Judkins R4 guide catheter. Left heart catheterization was done using the JR 4 catheter.  CONTRAST: Total of 35 cc.  COMPLICATIONS: None.  HEMODYNAMICS: Aortic pressure was 128/69; LV pressure was 124/1; LVEDP 9. There was no gradient between the left ventricle and aorta.  ANGIOGRAPHIC DATA: The left main coronary artery is widely patent proximally. There is minimal distal disease.  The left anterior descending artery is a large vessel proximally. There is an ostial 95% stenosis. Remainder of the LAD has moderate disease. The vessel appears somewhat underfilled. There are left to right collaterals noted as well. There is a long first diagonal. It appears somewhat underfilled. There is a moderate, proximal stenosis in the diagonal.  The left circumflex artery is a large vessel. There is mild disease proximally. There is a first obtuse marginal which is medium-sized and patent. The second obtuse marginal is very large and branches across the lateral wall. This vessel has only minimal disease. The remainder of the circumflex is large and widely patent.  The right coronary artery is a large dominant vessel. There is a stent in the  proximal vessel which is widely patent. After a large RV marginal branch, there is a long segment of severe disease in the mid to distal RCA, up to 95%. There is a stent in the distal RCA which appears patent. After the distal stent, a large posterior descending artery is noted. There is bidirectional flow in the posterior descending artery, likely from the left sided collaterals. The posterior lateral artery appears occluded. There are some collaterals from the RV marginal branch which appeared to go towards the LAD system.  LEFT VENTRICULOGRAM: Left ventricular angiogram was not done. LVEDP was 9 mmHg.  IMPRESSIONS:  1. Minimal distal left main coronary artery disease. 2. Severe disease in the ostial left anterior descending artery. There is moderate diffuse disease in the proximal to mid vessel. 3. Mild disease in the left circumflex artery and its branches. 4. Patent stents in the proximal and distal right coronary artery. Severe disease in a long segment of the mid to distal RCA. 5. LVEDP 9 mmHg. Ejection fraction not assessed, normal by nuclear study. RECOMMENDATION: Will obtain cardiac surgery evaluation. Her ostial LAD disease it is not favorable for PCI since the large circumflex vessel would have to be jailed. There is a long segment of disease in the RCA as well. We'll move the patient to step down. I've communicated with the family. Will hold clopidogrel at this point. Will start heparin 6 hours after the sheath pull.     Assessment/Plan:  She has severe 2 vessel coronary disease with a high grade ostial LAD lesion and a long mid to distal RCA lesion. I agree that PCI is not an option given her disease location. I think CABG is the best treatment for her although her operative risk is increased due to her prior stroke and disability. She has been on Plavix which has been stopped. We will check  P2Y12 tomorrow. I would plan to do later this week if platelet function is ok. I discussed the  operative procedure with the patient and family including alternatives, benefits and risks; including but not limited to bleeding, blood transfusion, infection, stroke, myocardial infarction, graft failure, heart block requiring a permanent pacemaker, organ dysfunction, and death.  Kristin [name] understands and agrees to proceed.  We will schedule surgery for Thursday or Friday. Check carotid dopplers.  Gaye Pollack 12/13/2013, 5:48 PM

## 2013-12-14 ENCOUNTER — Encounter (HOSPITAL_COMMUNITY): Payer: Self-pay | Admitting: Cardiology

## 2013-12-14 DIAGNOSIS — I6330 Cerebral infarction due to thrombosis of unspecified cerebral artery: Secondary | ICD-10-CM

## 2013-12-14 DIAGNOSIS — I10 Essential (primary) hypertension: Secondary | ICD-10-CM | POA: Diagnosis present

## 2013-12-14 DIAGNOSIS — I200 Unstable angina: Secondary | ICD-10-CM

## 2013-12-14 DIAGNOSIS — E785 Hyperlipidemia, unspecified: Secondary | ICD-10-CM

## 2013-12-14 LAB — HEPARIN LEVEL (UNFRACTIONATED): Heparin Unfractionated: 0.37 IU/mL (ref 0.30–0.70)

## 2013-12-14 LAB — CBC
HCT: 40.6 % (ref 36.0–46.0)
Hemoglobin: 13 g/dL (ref 12.0–15.0)
MCH: 27.7 pg (ref 26.0–34.0)
MCHC: 32 g/dL (ref 30.0–36.0)
MCV: 86.6 fL (ref 78.0–100.0)
Platelets: 170 10*3/uL (ref 150–400)
RBC: 4.69 MIL/uL (ref 3.87–5.11)
RDW: 14.4 % (ref 11.5–15.5)
WBC: 6.4 10*3/uL (ref 4.0–10.5)

## 2013-12-14 LAB — PLATELET INHIBITION P2Y12: Platelet Function  P2Y12: 143 [PRU] — ABNORMAL LOW (ref 194–418)

## 2013-12-14 NOTE — Progress Notes (Signed)
Patient: Kristin [name] / Admit Date: 12/09/2013 / Date of Encounter: 12/14/2013, 9:51 AM  73 y/o F with h/o CAD (hx of 2 stents ? 2010 at [place]), CVA (2001) with resultant Rt spastic hemiplegia and aphasia, HTN, HL, and GERD who presented to [facility] on 12/09/13 with sudden onset of epigastric and substernal chest pain without radiation. She has been pain free since her admission. Troponin negative x 3. NSR, 68, TWI v2-v3. Lexiscan Myoview on 8/21 high risk with stress-induced ischemia in the apex extending into the septum and inferior walls, septal HK, EF 60%. Echo nl. Cardiac Cath 8/24:    Subjective: She shakes her head "no" when asked of any chest pain or SOB. She is difficult to understand 2/2 dysarthria.  Expressive aphasia but makes + or - gestures.  Objective: Telemetry: sinus bradycardia, 50s Physical Exam: Blood pressure 127/92, pulse 52, temperature 97.5 F (36.4 C), temperature source Oral, resp. rate 17, height 5' 6.93" (1.7 m), weight 180 lb 1.9 oz (81.7 kg), SpO2 97.00%. General: Well developed, well nourished, in no acute distress. Head: Normocephalic, atraumatic, sclera non-icteric, no xanthomas, nares are without discharge. Neck: Negative for carotid bruits. JVP not elevated. Lungs: Clear bilaterally to auscultation with mild RLL rhonchi that cleared with cough. No wheezes or rales; Breathing is unlabored. Heart: RRR S1 S2 without murmurs, rubs, or gallops.  Abdomen: Soft, non-tender, non-distended with normoactive bowel sounds. No rebound/guarding. Extremities: No clubbing or cyanosis. No edema. Distal pedal pulses are 2+ and equal bilaterally. Neuro: Alert and oriented X 3. Expressive aphasia ("tootle tuttle tootle"). R arm contracture & hemiparesis. Psych:  Responds to questions appropriately with a normal affect.   Intake/Output Summary (Last 24 hours) at 12/14/13 0951 Last data filed at 12/14/13 0900  Gross per 24 hour  Intake 531.91 ml  Output   1125 ml  Net -593.09  ml    Inpatient Medications:  . aspirin  81 mg Oral Daily  . atenolol  12.5 mg Oral BID  . atorvastatin  80 mg Oral q1800  . isosorbide mononitrate  30 mg Oral Daily  . oxybutynin  5 mg Oral Daily  . pantoprazole  40 mg Oral QAC breakfast  . pregabalin  150 mg Oral BID  . ranolazine  500 mg Oral BID  . sertraline  75 mg Oral QHS  . trimethoprim  100 mg Oral Daily   Infusions:  . heparin 950 Units/hr (12/14/13 0800)    Labs: No results found for this basename: NA, K, CL, CO2, GLUCOSE, BUN, CREATININE, CALCIUM, MG, PHOS,  in the last 72 hours No results found for this basename: AST, ALT, ALKPHOS, BILITOT, PROT, ALBUMIN,  in the last 72 hours  Recent Labs  12/14/13 0355  WBC 6.4  HGB 13.0  HCT 40.6  MCV 86.6  PLT 170   Cardiac Catheterization 12/13/2013:  INDICATIONS: High-risk abnormal stress test. Unstable angina  LEFT VENTRICULOGRAM: Left ventricular angiogram was not done. LVEDP was 9 mmHg.  IMPRESSIONS:  1. Minimal distal left main coronary artery disease. 2. Severe disease in the ostial left anterior descending artery. There is moderate diffuse disease in the proximal to mid vessel. 3. Mild disease in the left circumflex artery and its branches. 4. Patent stents in the proximal and distal right coronary artery. Severe disease in a long segment of the mid to distal RCA. 5. LVEDP 9 mmHg. Ejection fraction not assessed, normal by nuclear study. RECOMMENDATION: Will obtain cardiac surgery evaluation. Her ostial LAD disease it is  not favorable for PCI since the large circumflex vessel would have to be jailed. There is a long segment of disease in the RCA as well. We'll move the patient to step down. I've communicated with the family. Will hold clopidogrel at this point. Will start heparin 6 hours after the sheath pull.    Assessment and Plan  Principal Problem:   Atherosclerotic heart disease of native coronary artery with unstable angina pectoris: H/o PCI to RCA; Ostial  LAD &dRCA severe stenosis. Active Problems:   Unstable angina   CVA 2001   Dyslipidemia, goal LDL below 70   Abnormal EKG - Anterior TWI    Essential hypertension   Aphasia as late effect of stroke   Spastic hemiplegia affecting dominant side  1. Unstable Angina - Severe 2 V CAD: no current Sx.  On aspirin & IV Heparin  Plavix has been held - P2Y12 Inhibition Assay ordered.  Low Dose B-blocker (cannot up-titrate due to bradycardia) & Lipitor,   Imdur & Ranexa  2. HTN: -Controlled -Continue current medications  4. HLD -On Lipitor  5. GERD -Protonix 40 mg  6. H/o CVA 2001: Resultant right spastic hemiplegia and aphasia   Zoloft; Lyrica   Signed, Leonie Man, M.D., M.S. Interventional Cardiologist   Pager # [phone] 12/14/2013

## 2013-12-14 NOTE — Progress Notes (Signed)
Case discussed with Mr. Kristin [name]. Was seen by Dr. Irish Lack & cardiac catheterization performed.   MV CAD - Ostial LAD & m-dRCA --> transferred to SDU for CT Sgx consult.  Leonie Man, M.D., M.S. Interventional Cardiologist   Pager # [phone] 12/14/2013

## 2013-12-14 NOTE — Consult Note (Signed)
ANTICOAGULATION CONSULT NOTE - Follow-up Consult  Pharmacy Consult for Heparin Indication: severe LAD artery disease  No Known Allergies  Patient Measurements: Height: 5' 6.93" (170 cm) Weight: 180 lb 1.9 oz (81.7 kg) IBW/kg (Calculated) : 61.44 Heparin Dosing Weight: 78kg  Vital Signs: Temp: 97.3 F (36.3 C) (08/25 1220) Temp src: Oral (08/25 1220) BP: 97/67 mmHg (08/25 1220) Pulse Rate: 52 (08/25 0800)  Labs:  Recent Labs  12/13/13 0401 12/13/13 2255 12/14/13 0355  HGB  --   --  13.0  HCT  --   --  40.6  PLT  --   --  170  LABPROT 12.9  --   --   INR 0.97  --   --   HEPARINUNFRC  --  0.32 0.37    Estimated Creatinine Clearance: 66.7 ml/min (by C-G formula based on Cr of 0.72).  Medications: Heparin @ 950 units/hr  Assessment: 73yof who presented with epigastric/substernal pain. Troponins negative so she underwent a stress test which turned out to be high risk. She then had a cath and was found to have severe LAD artery disease. She was started on heparin with plans for CABG either Thursday or Friday. Heparin level is therapeutic. CBC stable. No bleeding reported.  Goal of Therapy:  Heparin level 0.3-0.7 units/ml Monitor platelets by anticoagulation protocol: Yes   Plan:  1) Continue heparin at 950 units/hr 2) Follow up heparin level, CBC in AM 3) Follow up timing of CABG  Deboraha Sprang 12/14/2013,1:23 PM

## 2013-12-15 DIAGNOSIS — Z01810 Encounter for preprocedural cardiovascular examination: Secondary | ICD-10-CM

## 2013-12-15 LAB — CBC
HCT: 43.4 % (ref 36.0–46.0)
Hemoglobin: 14.6 g/dL (ref 12.0–15.0)
MCH: 28.9 pg (ref 26.0–34.0)
MCHC: 33.6 g/dL (ref 30.0–36.0)
MCV: 85.8 fL (ref 78.0–100.0)
Platelets: 195 10*3/uL (ref 150–400)
RBC: 5.06 MIL/uL (ref 3.87–5.11)
RDW: 14.5 % (ref 11.5–15.5)
WBC: 11.9 10*3/uL — ABNORMAL HIGH (ref 4.0–10.5)

## 2013-12-15 LAB — HEPARIN LEVEL (UNFRACTIONATED): Heparin Unfractionated: 0.58 IU/mL (ref 0.30–0.70)

## 2013-12-15 NOTE — Progress Notes (Signed)
2 Days Post-Op Procedure(s) (LRB): LEFT HEART CATHETERIZATION WITH CORONARY ANGIOGRAM (N/A) Subjective: No chest pain or shortness of breath  Objective: Vital signs in last 24 hours: Temp:  [97.7 F (36.5 C)-98.2 F (36.8 C)] 97.7 F (36.5 C) (08/26 1644) Pulse Rate:  [50-57] 50 (08/26 0353) Cardiac Rhythm:  [-] Normal sinus rhythm (08/26 0800) Resp:  [11-24] 21 (08/26 1644) BP: (104-142)/(66-95) 118/66 mmHg (08/26 1644) SpO2:  [95 %-100 %] 100 % (08/26 1644)  Hemodynamic parameters for last 24 hours:    Intake/Output from previous day: 08/25 0701 - 08/26 0700 In: 1188 [P.O.:960; I.V.:228] Out: 650 [Urine:650] Intake/Output this shift: Total I/O In: 76 [I.V.:76] Out: 450 [Urine:450]  General appearance: alert and cooperative Heart: regular rate and rhythm, S1, S2 normal, no murmur, click, rub or gallop Lungs: clear to auscultation bilaterally  Lab Results:  Recent Labs  12/14/13 0355 12/15/13 1000  WBC 6.4 11.9*  HGB 13.0 14.6  HCT 40.6 43.4  PLT 170 195   BMET: No results found for this basename: NA, K, CL, CO2, GLUCOSE, BUN, CREATININE, CALCIUM,  in the last 72 hours  PT/INR:  Recent Labs  12/13/13 0401  LABPROT 12.9  INR 0.97   ABG No results found for this basename: phart, pco2, po2, hco3, tco2, acidbasedef, o2sat   CBG (last 3)  No results found for this basename: GLUCAP,  in the last 72 hours  P2Y12 143   Assessment/Plan: S/P Procedure(s) (LRB): LEFT HEART CATHETERIZATION WITH CORONARY ANGIOGRAM (N/A) Severe multivessel CAD. Plan CABG on Friday. Her platelets are significantly inhibited by Plavix. Will check it again Thursday but I suspect it will be ok for Friday. I discussed surgery again with the patient, her husband and daughter and they agree to proceed.    LOS: 6 days    Husain Costabile K 12/15/2013

## 2013-12-15 NOTE — Progress Notes (Signed)
Patient: Kristin [name] / Admit Date: 12/09/2013 / Date of Encounter: 12/15/2013, 1:16 PM  73 y/o F with h/o CAD (hx of 2 stents ? 2010 at [place]), CVA (2001) with resultant Rt spastic hemiplegia and aphasia, HTN, HL, and GERD who presented to [facility] on 12/09/13 with sudden onset of epigastric and substernal chest pain without radiation. She has been pain free since her admission. Troponin negative x 3. NSR, 68, TWI v2-v3. Lexiscan Myoview on 8/21 high risk with stress-induced ischemia in the apex extending into the septum and inferior walls, septal HK, EF 60%. Echo nl. Cardiac Cath 8/24:    Subjective: Stable from yesterday No c/o CP or SOB. Answers with head shake   Objective: Telemetry: sinus bradycardia, 50s Physical Exam: Blood pressure 142/95, pulse 50, temperature 97.9 F (36.6 C), temperature source Axillary, resp. rate 11, height 5' 6.93" (1.7 m), weight 180 lb 1.9 oz (81.7 kg), SpO2 99.00%. General: Well developed, well nourished, in no acute distress. Head: Normocephalic, atraumatic, sclera non-icteric, no xanthomas, nares are without discharge. Neck: Negative for carotid bruits. JVP not elevated. Lungs: Clear bilaterally to auscultation with mild RLL rhonchi that cleared with cough. No wheezes or rales; Breathing is unlabored. Heart: RRR S1 S2 without murmurs, rubs, or gallops.  Abdomen: Soft, non-tender, non-distended with normoactive bowel sounds. No rebound/guarding. Extremities: No clubbing or cyanosis. No edema. Distal pedal pulses are 2+ and equal bilaterally. Neuro: Alert and oriented X 3. Expressive aphasia ("tootle tuttle tootle"). R arm contracture & hemiparesis. Psych:  Responds to questions appropriately with a normal affect.   Intake/Output Summary (Last 24 hours) at 12/15/13 1316 Last data filed at 12/15/13 1234  Gross per 24 hour  Intake  938.5 ml  Output    750 ml  Net  188.5 ml    Inpatient Medications:  . aspirin  81 mg Oral Daily  . atenolol  12.5 mg Oral  BID  . atorvastatin  80 mg Oral q1800  . isosorbide mononitrate  30 mg Oral Daily  . oxybutynin  5 mg Oral Daily  . pantoprazole  40 mg Oral QAC breakfast  . pregabalin  150 mg Oral BID  . ranolazine  500 mg Oral BID  . sertraline  75 mg Oral QHS  . trimethoprim  100 mg Oral Daily   Infusions:  . heparin 950 Units/hr (12/15/13 0800)    Labs: No results found for this basename: NA, K, CL, CO2, GLUCOSE, BUN, CREATININE, CALCIUM, MG, PHOS,  in the last 72 hours No results found for this basename: AST, ALT, ALKPHOS, BILITOT, PROT, ALBUMIN,  in the last 72 hours  Recent Labs  12/14/13 0355 12/15/13 1000  WBC 6.4 11.9*  HGB 13.0 14.6  HCT 40.6 43.4  MCV 86.6 85.8  PLT 170 195   Cardiac Catheterization 12/13/2013:  INDICATIONS: High-risk abnormal stress test. Unstable angina  LEFT VENTRICULOGRAM: Left ventricular angiogram was not done. LVEDP was 9 mmHg.  IMPRESSIONS:  1. Minimal distal left main coronary artery disease. 2. Severe disease in the ostial left anterior descending artery. There is moderate diffuse disease in the proximal to mid vessel. 3. Mild disease in the left circumflex artery and its branches. 4. Patent stents in the proximal and distal right coronary artery. Severe disease in a long segment of the mid to distal RCA. 5. LVEDP 9 mmHg. Ejection fraction not assessed, normal by nuclear study. RECOMMENDATION: Will obtain cardiac surgery evaluation. Her ostial LAD disease it is not favorable for PCI since the  large circumflex vessel would have to be jailed. There is a long segment of disease in the RCA as well. We'll move the patient to step down. I've communicated with the family. Will hold clopidogrel at this point. Will start heparin 6 hours after the sheath pull.    Assessment and Plan  Principal Problem:   Atherosclerotic heart disease of native coronary artery with unstable angina pectoris: H/o PCI to RCA; Ostial LAD &dRCA severe stenosis. Active Problems:    Unstable angina   CVA 2001   Dyslipidemia, goal LDL below 70   Abnormal EKG - Anterior TWI    Essential hypertension   Aphasia as late effect of stroke   Spastic hemiplegia affecting dominant side  1. Unstable Angina - Severe 2 V CAD: no current Sx.  On aspirin & IV Heparin  Plavix has been held - P2Y12 Inhibition - 134 (may delay OR date)  Low Dose B-blocker (cannot up-titrate due to bradycardia) & Lipitor,   Imdur & Ranexa  2. HTN: -Controlled -Continue current medications;  May need additional BP control if BP continue current trend  4. HLD -On Lipitor  5. GERD -Protonix 40 mg  6. H/o CVA 2001: Resultant right spastic hemiplegia and aphasia   Zoloft; Lyrica   Signed, Leonie Man, M.D., M.S. Interventional Cardiologist   Pager # [phone] 12/15/2013

## 2013-12-15 NOTE — Progress Notes (Signed)
Pre-op Cardiac Surgery  Carotid Findings:  Findings suggest 1-39% internal carotid artery stenosis bilaterally. Vertebral arteries are patent with antegrade flow.  Upper Extremity Right Left  Brachial Pressures 118-Triphasic 119-Triphasic  Radial Waveforms Triphasic Triphasic  Ulnar Waveforms Triphasic Triphasic  Palmar Arch (Allen's Test) Within normal limits. Unable to assess due to recent radial catheterization.   Bilateral palpable pedal pulses.  12/15/2013 5:14 PM Maudry Mayhew, RVT, RDCS, RDMS

## 2013-12-15 NOTE — Progress Notes (Signed)
CARDIAC REHAB PHASE I  Discussed mobility, sternal precautions, IS with pt and husband. Pt voices that she gets out of bed by pulling on bedrail on her left side. Husband sts she has limited mobility at home and walks short distances with a rollator (one hand) for support. It appears that pt would benefit from PT before and after surgery. Pt and husband live at [facility] where there is a rehab unit if she needs it at d/c. Husband uses a rollator as well. Suggest PT c/s for strengthening and mobility practice pre-op. [phone] Kristin [name] [place] CES, ACSM 12/15/2013 1:29 PM

## 2013-12-15 NOTE — Consult Note (Signed)
ANTICOAGULATION CONSULT NOTE - Follow-up Consult  Pharmacy Consult for Heparin Indication: severe LAD artery disease  No Known Allergies  Patient Measurements: Height: 5' 6.93" (170 cm) Weight: 180 lb 1.9 oz (81.7 kg) IBW/kg (Calculated) : 61.44 Heparin Dosing Weight: 78kg  Vital Signs: Temp: 98.1 F (36.7 C) (08/26 0700) Temp src: Oral (08/26 0700) BP: 142/95 mmHg (08/26 0700) Pulse Rate: 50 (08/26 0353)  Labs:  Recent Labs  12/13/13 0401 12/13/13 2255 12/14/13 0355 12/15/13 0716  HGB  --   --  13.0  --   HCT  --   --  40.6  --   PLT  --   --  170  --   LABPROT 12.9  --   --   --   INR 0.97  --   --   --   HEPARINUNFRC  --  0.32 0.37 0.58    Estimated Creatinine Clearance: 66.7 ml/min (by C-G formula based on Cr of 0.72).  Medications: Heparin @ 950 units/hr  Assessment: 73yof who presented with epigastric/substernal pain. Troponins negative so she underwent a stress test which turned out to be high risk. She then had a cath and was found to have severe LAD artery disease. She was started on heparin with plans for CABG either Thursday or Friday (P2Y12 143 on 8/25). Heparin level is therapeutic (HL= 0.58). CBC pending for today.  Goal of Therapy:  Heparin level 0.3-0.7 units/ml Monitor platelets by anticoagulation protocol: Yes   Plan:  1) Continue heparin at 950 units/hr 2) Follow up heparin level, CBC in AM 3) Follow up timing of CABG  Hildred Laser, Pharm D 12/15/2013 8:29 AM

## 2013-12-16 ENCOUNTER — Inpatient Hospital Stay (HOSPITAL_COMMUNITY): Payer: Medicare PPO

## 2013-12-16 LAB — BLOOD GAS, ARTERIAL
Acid-base deficit: 0.8 mmol/L (ref 0.0–2.0)
Bicarbonate: 22.6 mEq/L (ref 20.0–24.0)
FIO2: 0.21 %
O2 Saturation: 95.7 %
Patient temperature: 98.6
TCO2: 23.7 mmol/L (ref 0–100)
pCO2 arterial: 32.8 mmHg — ABNORMAL LOW (ref 35.0–45.0)
pH, Arterial: 7.454 — ABNORMAL HIGH (ref 7.350–7.450)
pO2, Arterial: 77.3 mmHg — ABNORMAL LOW (ref 80.0–100.0)

## 2013-12-16 LAB — TYPE AND SCREEN
ABO/RH(D): O POS
Antibody Screen: NEGATIVE

## 2013-12-16 LAB — HEPARIN LEVEL (UNFRACTIONATED)
Heparin Unfractionated: 0.2 IU/mL — ABNORMAL LOW (ref 0.30–0.70)
Heparin Unfractionated: 0.44 IU/mL (ref 0.30–0.70)

## 2013-12-16 LAB — CBC
HCT: 42.5 % (ref 36.0–46.0)
Hemoglobin: 14.4 g/dL (ref 12.0–15.0)
MCH: 29.2 pg (ref 26.0–34.0)
MCHC: 33.9 g/dL (ref 30.0–36.0)
MCV: 86.2 fL (ref 78.0–100.0)
Platelets: UNDETERMINED 10*3/uL (ref 150–400)
RBC: 4.93 MIL/uL (ref 3.87–5.11)
RDW: 15 % (ref 11.5–15.5)
WBC: 9.4 10*3/uL (ref 4.0–10.5)

## 2013-12-16 LAB — APTT: aPTT: 56 seconds — ABNORMAL HIGH (ref 24–37)

## 2013-12-16 LAB — ABO/RH: ABO/RH(D): O POS

## 2013-12-16 LAB — CREATININE, SERUM
Creatinine, Ser: 0.72 mg/dL (ref 0.50–1.10)
GFR calc Af Amer: >90 mL/min (ref >90)
GFR calc non Af Amer: 82 mL/min — ABNORMAL LOW (ref >90)

## 2013-12-16 LAB — HEMOGLOBIN A1C
Hgb A1c MFr Bld: 5.8 % — ABNORMAL HIGH (ref <5.7)
Mean Plasma Glucose: 120 mg/dL — ABNORMAL HIGH (ref <117)

## 2013-12-16 LAB — SURGICAL PCR SCREEN
MRSA, PCR: NEGATIVE
Staphylococcus aureus: POSITIVE — AB

## 2013-12-16 LAB — PLATELET INHIBITION P2Y12: Platelet Function  P2Y12: 199 [PRU] (ref 194–418)

## 2013-12-16 MED ORDER — METOPROLOL TARTRATE 12.5 MG HALF TABLET
12.5000 mg | ORAL_TABLET | Freq: Once | ORAL | Status: DC
  Filled 2013-12-16: qty 1

## 2013-12-16 MED ORDER — DEXTROSE 5 % IV SOLN
30.0000 ug/min | INTRAVENOUS | Status: DC
  Filled 2013-12-16: qty 2

## 2013-12-16 MED ORDER — PLASMA-LYTE 148 IV SOLN
INTRAVENOUS | Status: AC
  Administered 2013-12-17: 08:00:00
  Filled 2013-12-16: qty 2.5

## 2013-12-16 MED ORDER — CHLORHEXIDINE GLUCONATE CLOTH 2 % EX PADS: 6.0000 | MEDICATED_PAD | Freq: Once | CUTANEOUS | Status: DC

## 2013-12-16 MED ORDER — CHLORHEXIDINE GLUCONATE CLOTH 2 % EX PADS
6.0000 | MEDICATED_PAD | Freq: Once | CUTANEOUS | Status: AC
  Administered 2013-12-17: 6 via TOPICAL

## 2013-12-16 MED ORDER — TEMAZEPAM 15 MG PO CAPS
15.0000 mg | ORAL_CAPSULE | Freq: Once | ORAL | Status: AC | PRN
  Administered 2013-12-16: 15 mg via ORAL
  Filled 2013-12-16: qty 1

## 2013-12-16 MED ORDER — NITROGLYCERIN IN D5W 200-5 MCG/ML-% IV SOLN: 2.0000 ug/min | INTRAVENOUS | Status: DC

## 2013-12-16 MED ORDER — DEXTROSE 5 % IV SOLN
750.0000 mg | INTRAVENOUS | Status: DC
  Filled 2013-12-16: qty 750

## 2013-12-16 MED ORDER — DOPAMINE-DEXTROSE 3.2-5 MG/ML-% IV SOLN
2.0000 ug/kg/min | INTRAVENOUS | Status: DC
  Filled 2013-12-16: qty 250

## 2013-12-16 MED ORDER — DEXTROSE 5 % IV SOLN
1.5000 g | INTRAVENOUS | Status: DC
  Filled 2013-12-16: qty 1.5

## 2013-12-16 MED ORDER — MUPIROCIN 2 % EX OINT
1.0000 "application " | TOPICAL_OINTMENT | Freq: Two times a day (BID) | CUTANEOUS | Status: DC
  Administered 2013-12-16: 1 via NASAL
  Filled 2013-12-16: qty 22

## 2013-12-16 MED ORDER — VANCOMYCIN HCL 10 G IV SOLR
1250.0000 mg | INTRAVENOUS | Status: AC
  Administered 2013-12-17 (×2): 1250 mg via INTRAVENOUS
  Filled 2013-12-16: qty 1250

## 2013-12-16 MED ORDER — SODIUM CHLORIDE 0.9 % IV SOLN
INTRAVENOUS | Status: AC
  Administered 2013-12-17: 1.5 [IU]/h via INTRAVENOUS
  Filled 2013-12-16: qty 2.5

## 2013-12-16 MED ORDER — BISACODYL 5 MG PO TBEC
5.0000 mg | DELAYED_RELEASE_TABLET | Freq: Once | ORAL | Status: AC
  Administered 2013-12-16: 5 mg via ORAL
  Filled 2013-12-16: qty 1

## 2013-12-16 MED ORDER — DEXMEDETOMIDINE HCL IN NACL 400 MCG/100ML IV SOLN
0.1000 ug/kg/h | INTRAVENOUS | Status: DC
  Filled 2013-12-16: qty 100

## 2013-12-16 MED ORDER — DIAZEPAM 2 MG PO TABS: 2.0000 mg | ORAL_TABLET | Freq: Once | ORAL | Status: DC

## 2013-12-16 MED ORDER — SODIUM CHLORIDE 0.9 % IV SOLN
INTRAVENOUS | Status: DC
  Filled 2013-12-16: qty 30

## 2013-12-16 MED ORDER — HEPARIN (PORCINE) IN NACL 100-0.45 UNIT/ML-% IJ SOLN
1100.0000 [IU]/h | INTRAMUSCULAR | Status: DC
Start: 2013-12-16 — End: 2013-12-17
  Administered 2013-12-16: 1100 [IU]/h via INTRAVENOUS
  Filled 2013-12-16 (×3): qty 250

## 2013-12-16 MED ORDER — CHLORHEXIDINE GLUCONATE CLOTH 2 % EX PADS
6.0000 | MEDICATED_PAD | Freq: Every day | CUTANEOUS | Status: DC
  Administered 2013-12-16: 6 via TOPICAL

## 2013-12-16 MED ORDER — SODIUM CHLORIDE 0.9 % IV SOLN
INTRAVENOUS | Status: AC
  Administered 2013-12-17: 70 mL/h via INTRAVENOUS
  Filled 2013-12-16: qty 40

## 2013-12-16 MED ORDER — MAGNESIUM SULFATE 50 % IJ SOLN
40.0000 meq | INTRAMUSCULAR | Status: DC
  Filled 2013-12-16: qty 10

## 2013-12-16 MED ORDER — HEPARIN BOLUS VIA INFUSION
1000.0000 [IU] | Freq: Once | INTRAVENOUS | Status: AC
  Administered 2013-12-16: 1000 [IU] via INTRAVENOUS
  Filled 2013-12-16: qty 1000

## 2013-12-16 MED ORDER — POTASSIUM CHLORIDE 2 MEQ/ML IV SOLN
80.0000 meq | INTRAVENOUS | Status: DC
  Filled 2013-12-16: qty 40

## 2013-12-16 MED ORDER — EPINEPHRINE HCL 1 MG/ML IJ SOLN
0.5000 ug/min | INTRAMUSCULAR | Status: DC
  Filled 2013-12-16: qty 4

## 2013-12-16 NOTE — Progress Notes (Signed)
Patient: Kristin [name] / Admit Date: 12/09/2013 / Date of Encounter: 12/16/2013, 11:53 AM  73 y/o F with h/o CAD (hx of 2 stents ? 2010 at [place]), CVA (2001) with resultant Rt spastic hemiplegia and aphasia, HTN, HL, and GERD who presented to [facility] on 12/09/13 with sudden onset of epigastric and substernal chest pain without radiation. She has been pain free since her admission. Troponin negative x 3. NSR, 68, TWI v2-v3. Lexiscan Myoview on 8/21 high risk with stress-induced ischemia in the apex extending into the septum and inferior walls, septal HK, EF 60%. Echo nl. Cardiac Cath 8/24 with multivessel disease - Ostial LAD and RCA; referred for CABG after Plavix washout  Subjective: Stable from yesterday No c/o CP or SOB. Answers with head shake   Objective: Telemetry: sinus bradycardia, 50s Physical Exam: Blood pressure 97/69, pulse 85, temperature 97.3 F (36.3 C), temperature source Oral, resp. rate 18, height 5' 6.93" (1.7 m), weight 180 lb 1.9 oz (81.7 kg), SpO2 99.00%. General: Well developed, well nourished, in no acute distress. Head: Normocephalic, atraumatic, sclera non-icteric, no xanthomas, nares are without discharge. Lungs: Clear bilaterally to auscultation with mild RLL rhonchi that cleared with cough. No wheezes or rales; Breathing is unlabored. Heart: RRR S1 S2 without murmurs, rubs, or gallops.  Abdomen: Soft, non-tender, non-distended with normoactive bowel sounds. No rebound/guarding. Extremities: No clubbing or cyanosis. No edema. Distal pedal pulses are 2+ and equal bilaterally. Neuro: Alert and oriented X 3. Expressive aphasia ("tootle tuttle tootle"). R arm contracture & hemiparesis. Psych:  Responds to questions appropriately with a normal affect.   Intake/Output Summary (Last 24 hours) at 12/16/13 1153 Last data filed at 12/16/13 1000  Gross per 24 hour  Intake  199.5 ml  Output   1050 ml  Net -850.5 ml    Inpatient Medications: Reviewed  Infusions:  .  heparin 950 Units/hr (12/16/13 0800)    Labs:  Recent Labs  12/16/13 0628  CREATININE 0.72   No results found for this basename: AST, ALT, ALKPHOS, BILITOT, PROT, ALBUMIN,  in the last 72 hours  Recent Labs  12/15/13 1000 12/16/13 0628  WBC 11.9* 9.4  HGB 14.6 14.4  HCT 43.4 42.5  MCV 85.8 86.2  PLT 195 PLATELET CLUMPS NOTED ON SMEAR, UNABLE TO ESTIMATE   Cardiac Catheterization 12/13/2013: RECOMMENDATION: Will obtain cardiac surgery evaluation. Her ostial LAD disease it is not favorable for PCI since the large circumflex vessel would have to be jailed. There is a long segment of disease in the RCA as well. We'll move the patient to step down. I've communicated with the family. Will hold clopidogrel at this point. Will start heparin 6 hours after the sheath pull.    Assessment and Plan  Principal Problem:   Atherosclerotic heart disease of native coronary artery with unstable angina pectoris: H/o PCI to RCA; Ostial LAD &dRCA severe stenosis. Active Problems:   Unstable angina   CVA 2001   Dyslipidemia, goal LDL below 70   Abnormal EKG - Anterior TWI    Essential hypertension   Aphasia as late effect of stroke   Spastic hemiplegia affecting dominant side  1. Unstable Angina - Severe 2 V CAD: no current Sx. plan his CABG on Friday with Dr. Cyndia Bent.  On aspirin & IV Heparin  Plavix has been held - P2Y12 Inhibition - 134 on 825. Will recheck this afternoon.  Low Dose B-blocker (cannot up-titrate due to bradycardia) & Lipitor,   Imdur & Ranexa  2. HTN: -  Controlled to borderline low; yesterday's trend of higher prior pressures does not seem to be consistent to -Continue current medications;   4. HLD -On Lipitor  5. GERD -Protonix 40 mg  6. H/o CVA 2001: Resultant right spastic hemiplegia and aphasia   Zoloft; Lyrica   Signed, Leonie Man, M.D., M.S. Interventional Cardiologist   Pager # [phone] 12/16/2013

## 2013-12-16 NOTE — Progress Notes (Signed)
CARDIAC REHAB PHASE I   PRE:  Rate/Rhythm: 63 SR    BP: sitting 100/63    SaO2: 96 RA  MODE:  Ambulation: 80 ft   POST:  Rate/Rhythm: 75 SR    BP: sitting 117/77     SaO2: 97 RA  Pt able to get to EOB with min assist. Stood fairly independently. Able to walk with brace on right leg, shoes and rollator. Pt placed her right hand on the rollator with her left hand. Assist x2 with gait belt. Pts right foot sometimes hits the rollator wheel with stepping. Pt denied c/o, CP. Sts this is how she normally walks. Fairly steady. Will benefit from PT after surgery to assist with strengthening and gait. RN to set up pre-op video. [phone]  Josephina Shih [place] CES, ACSM 12/16/2013 2:59 PM

## 2013-12-16 NOTE — Consult Note (Signed)
ANTICOAGULATION CONSULT NOTE - Follow-up Consult  Pharmacy Consult for Heparin Indication: severe LAD artery disease  No Known Allergies  Patient Measurements: Height: 5' 6.93" (170 cm) Weight: 180 lb 1.9 oz (81.7 kg) IBW/kg (Calculated) : 61.44 Heparin Dosing Weight: 78kg  Vital Signs: Temp: 97.3 F (36.3 C) (08/27 1105) Temp src: Oral (08/27 1105) BP: 97/69 mmHg (08/27 1105) Pulse Rate: 85 (08/27 0708)  Labs:  Recent Labs  12/14/13 0355 12/15/13 0716 12/15/13 1000 12/16/13 0628 12/16/13 0936  HGB 13.0  --  14.6 14.4  --   HCT 40.6  --  43.4 42.5  --   PLT 170  --  195 PLATELET CLUMPS NOTED ON SMEAR, UNABLE TO ESTIMATE  --   HEPARINUNFRC 0.37 0.58  --   --  0.20*  CREATININE  --   --   --  0.72  --     Estimated Creatinine Clearance: 66.7 ml/min (by C-G formula based on Cr of 0.72).  Medications: Heparin @ 950 units/hr  Assessment: 73yof who presented with epigastric/substernal pain. Troponins negative so she underwent a stress test which turned out to be high risk. She then had a cath and was found to have severe LAD artery disease. She was started on heparin with plans for CABG  on Friday (P2Y12 143 on 8/25). Heparin level is now below goal  (HL= 0.2).  P2Y12 has been ordered for today  Goal of Therapy:  Heparin level 0.3-0.7 units/ml Monitor platelets by anticoagulation protocol: Yes   Plan:  -Heparin bolus 1000 units x1 and increase heparin rate to 1100 units/hr -Heparin level in 8 hrs -Heparin level and CBC daily  Hildred Laser, Pharm D 12/16/2013 12:03 PM

## 2013-12-16 NOTE — Progress Notes (Signed)
3 Days Post-Op Procedure(s) (LRB): LEFT HEART CATHETERIZATION WITH CORONARY ANGIOGRAM (N/A) Subjective:  No chest pain or dyspnea  Objective: Vital signs in last 24 hours: Temp:  [97.3 F (36.3 C)-98.3 F (36.8 C)] 97.3 F (36.3 C) (08/27 1105) Pulse Rate:  [58-85] 85 (08/27 0708) Cardiac Rhythm:  [-] Normal sinus rhythm (08/27 0413) Resp:  [18-21] 18 (08/27 1105) BP: (97-137)/(62-84) 97/69 mmHg (08/27 1105) SpO2:  [96 %-100 %] 99 % (08/27 1105)  Hemodynamic parameters for last 24 hours:    Intake/Output from previous day: 08/26 0701 - 08/27 0700 In: 228 [I.V.:228] Out: 750 [Urine:750] Intake/Output this shift: Total I/O In: 9.5 [I.V.:9.5] Out: 500 [Urine:500]  General appearance: alert and cooperative Heart: regular rate and rhythm, S1, S2 normal, no murmur, click, rub or gallop Lungs: clear to auscultation bilaterally  Lab Results:  Recent Labs  12/15/13 1000 12/16/13 0628  WBC 11.9* 9.4  HGB 14.6 14.4  HCT 43.4 42.5  PLT 195 PLATELET CLUMPS NOTED ON SMEAR, UNABLE TO ESTIMATE   BMET:  Recent Labs  12/16/13 0628  CREATININE 0.72    PT/INR: No results found for this basename: LABPROT, INR,  in the last 72 hours ABG No results found for this basename: phart, pco2, po2, hco3, tco2, acidbasedef, o2sat   CBG (last 3)  No results found for this basename: GLUCAP,  in the last 72 hours   PFT's show severe obstructive defect with FEV1 at 50% predicted.  Carotid dopplers show no significant ICA obstruction. Vertebral flow is antegrade.  Assessment/Plan:   Severe mult-vessel CAD. Plan CABG in am.   Gilford Raid K 12/16/2013

## 2013-12-16 NOTE — Progress Notes (Signed)
ANTICOAGULATION CONSULT NOTE - Follow Up Consult  Pharmacy Consult for heparin Indication: CAD awaiting CABG  Labs:  Recent Labs  12/14/13 0355 12/15/13 0716 12/15/13 1000 12/16/13 0628 12/16/13 0936 12/16/13 1130 12/16/13 2210  HGB 13.0  --  14.6 14.4  --   --   --   HCT 40.6  --  43.4 42.5  --   --   --   PLT 170  --  195 PLATELET CLUMPS NOTED ON SMEAR, UNABLE TO ESTIMATE  --   --   --   APTT  --   --   --   --   --  56*  --   HEPARINUNFRC 0.37 0.58  --   --  0.20*  --  0.44  CREATININE  --   --   --  0.72  --   --   --     Assessment/Plan:  73yo female therapeutic on heparin after rate increase. Will continue gtt at current rate and confirm stable with am labs, then plan for first-case CABG.   Wynona Neat, PharmD, BCPS  12/16/2013,11:02 PM

## 2013-12-17 ENCOUNTER — Inpatient Hospital Stay (HOSPITAL_COMMUNITY): Payer: Medicare PPO

## 2013-12-17 ENCOUNTER — Inpatient Hospital Stay (HOSPITAL_COMMUNITY): Payer: Medicare PPO | Admitting: Certified Registered Nurse Anesthetist

## 2013-12-17 ENCOUNTER — Encounter (HOSPITAL_COMMUNITY)
Admission: EM | Disposition: A | Discharge status: Skilled Nursing Facility | Payer: Medicare PPO | Source: Home / Self Care | Attending: Cardiology

## 2013-12-17 ENCOUNTER — Encounter (HOSPITAL_COMMUNITY): Payer: Medicare PPO | Admitting: Certified Registered Nurse Anesthetist

## 2013-12-17 DIAGNOSIS — I2510 Atherosclerotic heart disease of native coronary artery without angina pectoris: Secondary | ICD-10-CM

## 2013-12-17 DIAGNOSIS — Z951 Presence of aortocoronary bypass graft: Secondary | ICD-10-CM

## 2013-12-17 HISTORY — PX: INTRAOPERATIVE TRANSESOPHAGEAL ECHOCARDIOGRAM: SHX5062

## 2013-12-17 HISTORY — PX: CORONARY ARTERY BYPASS GRAFT: SHX141

## 2013-12-17 LAB — CBC
HCT: 41.2 % (ref 36.0–46.0)
HCT: 31.5 % — ABNORMAL LOW (ref 36.0–46.0)
HCT: 32.9 % — ABNORMAL LOW (ref 36.0–46.0)
Hemoglobin: 13.2 g/dL (ref 12.0–15.0)
Hemoglobin: 10.4 g/dL — ABNORMAL LOW (ref 12.0–15.0)
Hemoglobin: 10.9 g/dL — ABNORMAL LOW (ref 12.0–15.0)
MCH: 28.3 pg (ref 26.0–34.0)
MCH: 28 pg (ref 26.0–34.0)
MCH: 28.1 pg (ref 26.0–34.0)
MCHC: 32 g/dL (ref 30.0–36.0)
MCHC: 33 g/dL (ref 30.0–36.0)
MCHC: 33.1 g/dL (ref 30.0–36.0)
MCV: 88.4 fL (ref 78.0–100.0)
MCV: 84.9 fL (ref 78.0–100.0)
MCV: 84.8 fL (ref 78.0–100.0)
Platelets: 173 10*3/uL (ref 150–400)
Platelets: 152 10*3/uL (ref 150–400)
Platelets: 177 10*3/uL (ref 150–400)
RBC: 4.66 MIL/uL (ref 3.87–5.11)
RBC: 3.71 MIL/uL — ABNORMAL LOW (ref 3.87–5.11)
RBC: 3.88 MIL/uL (ref 3.87–5.11)
RDW: 14.8 % (ref 11.5–15.5)
RDW: 14.4 % (ref 11.5–15.5)
RDW: 14.5 % (ref 11.5–15.5)
WBC: 5.6 10*3/uL (ref 4.0–10.5)
WBC: 8.3 10*3/uL (ref 4.0–10.5)
WBC: 8 10*3/uL (ref 4.0–10.5)

## 2013-12-17 LAB — POCT I-STAT 4, (NA,K, GLUC, HGB,HCT)
Glucose, Bld: 126 mg/dL — ABNORMAL HIGH (ref 70–99)
HCT: 29 % — ABNORMAL LOW (ref 36.0–46.0)
Hemoglobin: 9.9 g/dL — ABNORMAL LOW (ref 12.0–15.0)
Potassium: 4 mEq/L (ref 3.7–5.3)
Sodium: 141 mEq/L (ref 137–147)

## 2013-12-17 LAB — POCT I-STAT 3, ART BLOOD GAS (G3+)
Acid-Base Excess: 2 mmol/L (ref 0.0–2.0)
Acid-Base Excess: 1 mmol/L (ref 0.0–2.0)
Acid-base deficit: 1 mmol/L (ref 0.0–2.0)
Acid-base deficit: 1 mmol/L (ref 0.0–2.0)
Bicarbonate: 23.5 mEq/L (ref 20.0–24.0)
Bicarbonate: 26.4 mEq/L — ABNORMAL HIGH (ref 20.0–24.0)
Bicarbonate: 24.1 mEq/L — ABNORMAL HIGH (ref 20.0–24.0)
Bicarbonate: 23.7 mEq/L (ref 20.0–24.0)
Bicarbonate: 25.4 mEq/L — ABNORMAL HIGH (ref 20.0–24.0)
O2 Saturation: 100 %
O2 Saturation: 100 %
O2 Saturation: 99 %
O2 Saturation: 98 %
O2 Saturation: 96 %
Patient temperature: 34.5
Patient temperature: 37
Patient temperature: 38
Patient temperature: 37.9
TCO2: 25 mmol/L (ref 0–100)
TCO2: 28 mmol/L (ref 0–100)
TCO2: 25 mmol/L (ref 0–100)
TCO2: 25 mmol/L (ref 0–100)
TCO2: 27 mmol/L (ref 0–100)
pCO2 arterial: 33.3 mmHg — ABNORMAL LOW (ref 35.0–45.0)
pCO2 arterial: 40.6 mmHg (ref 35.0–45.0)
pCO2 arterial: 33.9 mmHg — ABNORMAL LOW (ref 35.0–45.0)
pCO2 arterial: 39.8 mmHg (ref 35.0–45.0)
pCO2 arterial: 45.6 mmHg — ABNORMAL HIGH (ref 35.0–45.0)
pH, Arterial: 7.446 (ref 7.350–7.450)
pH, Arterial: 7.421 (ref 7.350–7.450)
pH, Arterial: 7.46 — ABNORMAL HIGH (ref 7.350–7.450)
pH, Arterial: 7.386 (ref 7.350–7.450)
pH, Arterial: 7.358 (ref 7.350–7.450)
pO2, Arterial: 322 mmHg — ABNORMAL HIGH (ref 80.0–100.0)
pO2, Arterial: 375 mmHg — ABNORMAL HIGH (ref 80.0–100.0)
pO2, Arterial: 114 mmHg — ABNORMAL HIGH (ref 80.0–100.0)
pO2, Arterial: 104 mmHg — ABNORMAL HIGH (ref 80.0–100.0)
pO2, Arterial: 87 mmHg (ref 80.0–100.0)

## 2013-12-17 LAB — POCT I-STAT, CHEM 8
BUN: 9 mg/dL (ref 6–23)
BUN: 9 mg/dL (ref 6–23)
BUN: 8 mg/dL (ref 6–23)
BUN: 8 mg/dL (ref 6–23)
BUN: 7 mg/dL (ref 6–23)
BUN: 7 mg/dL (ref 6–23)
Calcium, Ion: 1.22 mmol/L (ref 1.13–1.30)
Calcium, Ion: 1.15 mmol/L (ref 1.13–1.30)
Calcium, Ion: 0.99 mmol/L — ABNORMAL LOW (ref 1.13–1.30)
Calcium, Ion: 0.99 mmol/L — ABNORMAL LOW (ref 1.13–1.30)
Calcium, Ion: 0.9 mmol/L — ABNORMAL LOW (ref 1.13–1.30)
Calcium, Ion: 1.1 mmol/L — ABNORMAL LOW (ref 1.13–1.30)
Chloride: 107 mEq/L (ref 96–112)
Chloride: 106 mEq/L (ref 96–112)
Chloride: 100 mEq/L (ref 96–112)
Chloride: 101 mEq/L (ref 96–112)
Chloride: 103 mEq/L (ref 96–112)
Chloride: 106 mEq/L (ref 96–112)
Creatinine, Ser: 0.6 mg/dL (ref 0.50–1.10)
Creatinine, Ser: 0.5 mg/dL (ref 0.50–1.10)
Creatinine, Ser: 0.4 mg/dL — ABNORMAL LOW (ref 0.50–1.10)
Creatinine, Ser: 0.5 mg/dL (ref 0.50–1.10)
Creatinine, Ser: 0.4 mg/dL — ABNORMAL LOW (ref 0.50–1.10)
Creatinine, Ser: 0.6 mg/dL (ref 0.50–1.10)
Glucose, Bld: 111 mg/dL — ABNORMAL HIGH (ref 70–99)
Glucose, Bld: 118 mg/dL — ABNORMAL HIGH (ref 70–99)
Glucose, Bld: 100 mg/dL — ABNORMAL HIGH (ref 70–99)
Glucose, Bld: 99 mg/dL (ref 70–99)
Glucose, Bld: 129 mg/dL — ABNORMAL HIGH (ref 70–99)
Glucose, Bld: 95 mg/dL (ref 70–99)
HCT: 38 % (ref 36.0–46.0)
HCT: 35 % — ABNORMAL LOW (ref 36.0–46.0)
HCT: 28 % — ABNORMAL LOW (ref 36.0–46.0)
HCT: 28 % — ABNORMAL LOW (ref 36.0–46.0)
HCT: 26 % — ABNORMAL LOW (ref 36.0–46.0)
HCT: 33 % — ABNORMAL LOW (ref 36.0–46.0)
Hemoglobin: 12.9 g/dL (ref 12.0–15.0)
Hemoglobin: 11.9 g/dL — ABNORMAL LOW (ref 12.0–15.0)
Hemoglobin: 9.5 g/dL — ABNORMAL LOW (ref 12.0–15.0)
Hemoglobin: 9.5 g/dL — ABNORMAL LOW (ref 12.0–15.0)
Hemoglobin: 8.8 g/dL — ABNORMAL LOW (ref 12.0–15.0)
Hemoglobin: 11.2 g/dL — ABNORMAL LOW (ref 12.0–15.0)
Potassium: 3.8 mEq/L (ref 3.7–5.3)
Potassium: 3.7 mEq/L (ref 3.7–5.3)
Potassium: 4.5 mEq/L (ref 3.7–5.3)
Potassium: 4.8 mEq/L (ref 3.7–5.3)
Potassium: 4.3 mEq/L (ref 3.7–5.3)
Potassium: 3.7 mEq/L (ref 3.7–5.3)
Sodium: 140 mEq/L (ref 137–147)
Sodium: 140 mEq/L (ref 137–147)
Sodium: 132 mEq/L — ABNORMAL LOW (ref 137–147)
Sodium: 135 mEq/L — ABNORMAL LOW (ref 137–147)
Sodium: 139 mEq/L (ref 137–147)
Sodium: 143 mEq/L (ref 137–147)
TCO2: 24 mmol/L (ref 0–100)
TCO2: 23 mmol/L (ref 0–100)
TCO2: 23 mmol/L (ref 0–100)
TCO2: 24 mmol/L (ref 0–100)
TCO2: 24 mmol/L (ref 0–100)
TCO2: 22 mmol/L (ref 0–100)

## 2013-12-17 LAB — CREATININE, SERUM
Creatinine, Ser: 0.66 mg/dL (ref 0.50–1.10)
GFR calc Af Amer: >90 mL/min (ref >90)
GFR calc non Af Amer: 84 mL/min — ABNORMAL LOW (ref >90)

## 2013-12-17 LAB — BASIC METABOLIC PANEL
Anion gap: 13 (ref 5–15)
BUN: 13 mg/dL (ref 6–23)
CO2: 22 mEq/L (ref 19–32)
Calcium: 8.7 mg/dL (ref 8.4–10.5)
Chloride: 106 mEq/L (ref 96–112)
Creatinine, Ser: 0.79 mg/dL (ref 0.50–1.10)
GFR calc Af Amer: >90 mL/min (ref >90)
GFR calc non Af Amer: 79 mL/min — ABNORMAL LOW (ref >90)
Glucose, Bld: 118 mg/dL — ABNORMAL HIGH (ref 70–99)
Potassium: 3.8 mEq/L (ref 3.7–5.3)
Sodium: 141 mEq/L (ref 137–147)

## 2013-12-17 LAB — MAGNESIUM: Magnesium: 3 mg/dL — ABNORMAL HIGH (ref 1.5–2.5)

## 2013-12-17 LAB — PLATELET COUNT: Platelets: 98 10*3/uL — ABNORMAL LOW (ref 150–400)

## 2013-12-17 LAB — HEMOGLOBIN AND HEMATOCRIT, BLOOD
HCT: 27.4 % — ABNORMAL LOW (ref 36.0–46.0)
Hemoglobin: 9.1 g/dL — ABNORMAL LOW (ref 12.0–15.0)

## 2013-12-17 LAB — POCT I-STAT 3, VENOUS BLOOD GAS (G3P V)
Acid-base deficit: 2 mmol/L (ref 0.0–2.0)
Bicarbonate: 22.7 mEq/L (ref 20.0–24.0)
O2 Saturation: 86 %
Patient temperature: 34.5
TCO2: 24 mmol/L (ref 0–100)
pCO2, Ven: 32.4 mmHg — ABNORMAL LOW (ref 45.0–50.0)
pH, Ven: 7.443 — ABNORMAL HIGH (ref 7.250–7.300)
pO2, Ven: 43 mmHg (ref 30.0–45.0)

## 2013-12-17 LAB — HEPARIN LEVEL (UNFRACTIONATED): Heparin Unfractionated: 0.63 IU/mL (ref 0.30–0.70)

## 2013-12-17 LAB — PROTIME-INR
INR: 1.4 (ref 0.00–1.49)
Prothrombin Time: 17.2 seconds — ABNORMAL HIGH (ref 11.6–15.2)

## 2013-12-17 LAB — APTT: aPTT: 37 seconds (ref 24–37)

## 2013-12-17 SURGERY — CORONARY ARTERY BYPASS GRAFTING (CABG)
Anesthesia: General | Site: Chest

## 2013-12-17 MED ORDER — MIDAZOLAM HCL 10 MG/2ML IJ SOLN
INTRAMUSCULAR | Status: AC
  Filled 2013-12-17: qty 2

## 2013-12-17 MED ORDER — 0.9 % SODIUM CHLORIDE (POUR BTL) OPTIME
TOPICAL | Status: DC | PRN
  Administered 2013-12-17: 1000 mL

## 2013-12-17 MED ORDER — ARTIFICIAL TEARS OP OINT
TOPICAL_OINTMENT | OPHTHALMIC | Status: AC
  Filled 2013-12-17: qty 3.5

## 2013-12-17 MED ORDER — DEXMEDETOMIDINE HCL IN NACL 200 MCG/50ML IV SOLN
INTRAVENOUS | Status: DC | PRN
  Administered 2013-12-17: 0.7 ug/kg/h via INTRAVENOUS

## 2013-12-17 MED ORDER — PANTOPRAZOLE SODIUM 40 MG PO TBEC: 40.0000 mg | DELAYED_RELEASE_TABLET | Freq: Every day | ORAL | Status: DC

## 2013-12-17 MED ORDER — ASPIRIN EC 325 MG PO TBEC
325.0000 mg | DELAYED_RELEASE_TABLET | Freq: Every day | ORAL | Status: DC
  Administered 2013-12-18 – 2013-12-20 (×3): 325 mg via ORAL
  Filled 2013-12-17 (×3): qty 1

## 2013-12-17 MED ORDER — ASPIRIN 81 MG PO CHEW: 324.0000 mg | CHEWABLE_TABLET | Freq: Every day | ORAL | Status: DC

## 2013-12-17 MED ORDER — BISACODYL 5 MG PO TBEC
10.0000 mg | DELAYED_RELEASE_TABLET | Freq: Every day | ORAL | Status: DC
  Administered 2013-12-18 – 2013-12-20 (×3): 10 mg via ORAL
  Filled 2013-12-17 (×3): qty 2

## 2013-12-17 MED ORDER — DEXTROSE 5 % IV SOLN: 1.5000 g | INTRAVENOUS | Status: DC | PRN

## 2013-12-17 MED ORDER — HEPARIN SODIUM (PORCINE) 1000 UNIT/ML IJ SOLN
INTRAMUSCULAR | Status: AC
  Filled 2013-12-17: qty 1

## 2013-12-17 MED ORDER — METOPROLOL TARTRATE 12.5 MG HALF TABLET
12.5000 mg | ORAL_TABLET | Freq: Two times a day (BID) | ORAL | Status: DC
  Filled 2013-12-17 (×5): qty 1

## 2013-12-17 MED ORDER — ROCURONIUM BROMIDE 100 MG/10ML IV SOLN
INTRAVENOUS | Status: DC | PRN
  Administered 2013-12-17 (×3): 50 mg via INTRAVENOUS

## 2013-12-17 MED ORDER — PHENYLEPHRINE HCL 10 MG/ML IJ SOLN
0.0000 ug/min | INTRAVENOUS | Status: DC
  Administered 2013-12-18: 20 ug/min via INTRAVENOUS
  Filled 2013-12-17 (×2): qty 2

## 2013-12-17 MED ORDER — PROTAMINE SULFATE 10 MG/ML IV SOLN
INTRAVENOUS | Status: AC
  Filled 2013-12-17: qty 25

## 2013-12-17 MED ORDER — DEXTROSE 5 % IV SOLN
1.5000 g | Freq: Two times a day (BID) | INTRAVENOUS | Status: AC
  Administered 2013-12-17 – 2013-12-19 (×4): 1.5 g via INTRAVENOUS
  Filled 2013-12-17 (×4): qty 1.5

## 2013-12-17 MED ORDER — PHENYLEPHRINE 40 MCG/ML (10ML) SYRINGE FOR IV PUSH (FOR BLOOD PRESSURE SUPPORT)
PREFILLED_SYRINGE | INTRAVENOUS | Status: AC
  Filled 2013-12-17: qty 10

## 2013-12-17 MED ORDER — ALBUMIN HUMAN 5 % IV SOLN
250.0000 mL | INTRAVENOUS | Status: AC | PRN
  Administered 2013-12-17 (×3): 250 mL via INTRAVENOUS
  Filled 2013-12-17: qty 250

## 2013-12-17 MED ORDER — SUCCINYLCHOLINE CHLORIDE 20 MG/ML IJ SOLN
INTRAMUSCULAR | Status: AC
  Filled 2013-12-17: qty 1

## 2013-12-17 MED ORDER — DEXTROSE 5 % IV SOLN
2.0000 ug/min | INTRAVENOUS | Status: DC
  Filled 2013-12-17: qty 4

## 2013-12-17 MED ORDER — DEXMEDETOMIDINE HCL IN NACL 200 MCG/50ML IV SOLN
0.1000 ug/kg/h | INTRAVENOUS | Status: DC
  Filled 2013-12-17: qty 50

## 2013-12-17 MED ORDER — HEPARIN SODIUM (PORCINE) 1000 UNIT/ML IJ SOLN
INTRAMUSCULAR | Status: DC | PRN
  Administered 2013-12-17: 31000 [IU] via INTRAVENOUS

## 2013-12-17 MED ORDER — CHLORHEXIDINE GLUCONATE 0.12 % MT SOLN
15.0000 mL | Freq: Two times a day (BID) | OROMUCOSAL | Status: DC
  Administered 2013-12-17 – 2013-12-18 (×3): 15 mL via OROMUCOSAL
  Filled 2013-12-17 (×3): qty 15

## 2013-12-17 MED ORDER — MORPHINE SULFATE 2 MG/ML IJ SOLN
2.0000 mg | INTRAMUSCULAR | Status: DC | PRN
  Administered 2013-12-17 – 2013-12-18 (×4): 2 mg via INTRAVENOUS
  Filled 2013-12-17 (×4): qty 1

## 2013-12-17 MED ORDER — ACETAMINOPHEN 160 MG/5ML PO SOLN: 1000.0000 mg | Freq: Four times a day (QID) | ORAL | Status: DC

## 2013-12-17 MED ORDER — FAMOTIDINE IN NACL 20-0.9 MG/50ML-% IV SOLN
20.0000 mg | Freq: Two times a day (BID) | INTRAVENOUS | Status: AC
  Administered 2013-12-17: 20 mg via INTRAVENOUS

## 2013-12-17 MED ORDER — ALBUMIN HUMAN 5 % IV SOLN
INTRAVENOUS | Status: DC | PRN
  Administered 2013-12-17 (×2): via INTRAVENOUS

## 2013-12-17 MED ORDER — BISACODYL 10 MG RE SUPP: 10.0000 mg | Freq: Every day | RECTAL | Status: DC

## 2013-12-17 MED ORDER — SODIUM CHLORIDE 0.9 % IV SOLN
INTRAVENOUS | Status: DC
  Administered 2013-12-17: 10 mL/h via INTRAVENOUS

## 2013-12-17 MED ORDER — MIDAZOLAM HCL 2 MG/2ML IJ SOLN: 2.0000 mg | INTRAMUSCULAR | Status: DC | PRN

## 2013-12-17 MED ORDER — SODIUM CHLORIDE 0.9 % IV SOLN
INTRAVENOUS | Status: DC
  Filled 2013-12-17 (×2): qty 2.5

## 2013-12-17 MED ORDER — ACETAMINOPHEN 500 MG PO TABS
1000.0000 mg | ORAL_TABLET | Freq: Four times a day (QID) | ORAL | Status: DC
  Administered 2013-12-18 – 2013-12-20 (×10): 1000 mg via ORAL
  Filled 2013-12-17 (×14): qty 2

## 2013-12-17 MED ORDER — PROPOFOL 10 MG/ML IV BOLUS
INTRAVENOUS | Status: DC | PRN
  Administered 2013-12-17: 20 mg via INTRAVENOUS
  Administered 2013-12-17: 30 mg via INTRAVENOUS

## 2013-12-17 MED ORDER — NITROGLYCERIN IN D5W 200-5 MCG/ML-% IV SOLN: 0.0000 ug/min | INTRAVENOUS | Status: DC

## 2013-12-17 MED ORDER — OXYCODONE HCL 5 MG PO TABS: 5.0000 mg | ORAL_TABLET | ORAL | Status: DC | PRN

## 2013-12-17 MED ORDER — SODIUM CHLORIDE 0.9 % IJ SOLN: 3.0000 mL | INTRAMUSCULAR | Status: DC | PRN

## 2013-12-17 MED ORDER — DEXTROSE 5 % IV SOLN
1.5000 g | INTRAVENOUS | Status: DC | PRN
  Administered 2013-12-17: 1.5 g via INTRAVENOUS

## 2013-12-17 MED ORDER — FENTANYL CITRATE 0.05 MG/ML IJ SOLN
INTRAMUSCULAR | Status: AC
  Filled 2013-12-17: qty 5

## 2013-12-17 MED ORDER — METOPROLOL TARTRATE 25 MG/10 ML ORAL SUSPENSION
12.5000 mg | Freq: Two times a day (BID) | ORAL | Status: DC
  Filled 2013-12-17 (×3): qty 5

## 2013-12-17 MED ORDER — SODIUM CHLORIDE 0.9 % IJ SOLN
INTRAMUSCULAR | Status: AC
  Filled 2013-12-17: qty 10

## 2013-12-17 MED ORDER — VANCOMYCIN HCL IN DEXTROSE 1-5 GM/200ML-% IV SOLN
1000.0000 mg | Freq: Once | INTRAVENOUS | Status: AC
  Administered 2013-12-17: 1000 mg via INTRAVENOUS
  Filled 2013-12-17: qty 200

## 2013-12-17 MED ORDER — PHENYLEPHRINE HCL 10 MG/ML IJ SOLN
10.0000 mg | INTRAMUSCULAR | Status: DC | PRN
  Administered 2013-12-17: 25 ug/min via INTRAVENOUS

## 2013-12-17 MED ORDER — CETYLPYRIDINIUM CHLORIDE 0.05 % MT LIQD
7.0000 mL | Freq: Four times a day (QID) | OROMUCOSAL | Status: DC
  Administered 2013-12-18: 7 mL via OROMUCOSAL

## 2013-12-17 MED ORDER — ROCURONIUM BROMIDE 50 MG/5ML IV SOLN
INTRAVENOUS | Status: AC
  Filled 2013-12-17: qty 2

## 2013-12-17 MED ORDER — POTASSIUM CHLORIDE 10 MEQ/50ML IV SOLN
10.0000 meq | INTRAVENOUS | Status: AC
Start: 2013-12-17 — End: 2013-12-17

## 2013-12-17 MED ORDER — SODIUM CHLORIDE 0.9 % IJ SOLN
3.0000 mL | Freq: Two times a day (BID) | INTRAMUSCULAR | Status: DC
  Administered 2013-12-18 – 2013-12-20 (×5): 3 mL via INTRAVENOUS

## 2013-12-17 MED ORDER — ACETAMINOPHEN 160 MG/5ML PO SOLN: 650.0000 mg | Freq: Once | ORAL | Status: AC

## 2013-12-17 MED ORDER — LACTATED RINGERS IV SOLN: 500.0000 mL | Freq: Once | INTRAVENOUS | Status: AC | PRN

## 2013-12-17 MED ORDER — PROPOFOL 10 MG/ML IV BOLUS
INTRAVENOUS | Status: AC
  Filled 2013-12-17: qty 20

## 2013-12-17 MED ORDER — ACETAMINOPHEN 650 MG RE SUPP
650.0000 mg | Freq: Once | RECTAL | Status: AC
  Administered 2013-12-17: 650 mg via RECTAL

## 2013-12-17 MED ORDER — LIDOCAINE HCL (CARDIAC) 20 MG/ML IV SOLN
INTRAVENOUS | Status: AC
  Filled 2013-12-17: qty 5

## 2013-12-17 MED ORDER — THROMBIN 20000 UNITS EX SOLR
CUTANEOUS | Status: DC | PRN
  Administered 2013-12-17: 08:00:00 via TOPICAL

## 2013-12-17 MED ORDER — NITROGLYCERIN IN D5W 200-5 MCG/ML-% IV SOLN
INTRAVENOUS | Status: DC | PRN
  Administered 2013-12-17: 5 ug/min via INTRAVENOUS

## 2013-12-17 MED ORDER — FENTANYL CITRATE 0.05 MG/ML IJ SOLN
INTRAMUSCULAR | Status: DC | PRN
  Administered 2013-12-17: 50 ug via INTRAVENOUS
  Administered 2013-12-17: 500 ug via INTRAVENOUS
  Administered 2013-12-17: 250 ug via INTRAVENOUS
  Administered 2013-12-17 (×2): 100 ug via INTRAVENOUS

## 2013-12-17 MED ORDER — DOCUSATE SODIUM 100 MG PO CAPS
200.0000 mg | ORAL_CAPSULE | Freq: Every day | ORAL | Status: DC
  Administered 2013-12-18 – 2013-12-20 (×3): 200 mg via ORAL
  Filled 2013-12-17 (×3): qty 2

## 2013-12-17 MED ORDER — PROTAMINE SULFATE 10 MG/ML IV SOLN
INTRAVENOUS | Status: AC
Start: 2013-12-17 — End: 2013-12-17
  Filled 2013-12-17: qty 10

## 2013-12-17 MED ORDER — LACTATED RINGERS IV SOLN
INTRAVENOUS | Status: DC | PRN
  Administered 2013-12-17 (×3): via INTRAVENOUS

## 2013-12-17 MED ORDER — ARTIFICIAL TEARS OP OINT
TOPICAL_OINTMENT | OPHTHALMIC | Status: DC | PRN
  Administered 2013-12-17: 1 via OPHTHALMIC

## 2013-12-17 MED ORDER — PROTAMINE SULFATE 10 MG/ML IV SOLN
INTRAVENOUS | Status: DC | PRN
  Administered 2013-12-17: 100 mg via INTRAVENOUS
  Administered 2013-12-17: 90 mg via INTRAVENOUS
  Administered 2013-12-17: 100 mg via INTRAVENOUS
  Administered 2013-12-17: 10 mg via INTRAVENOUS

## 2013-12-17 MED ORDER — METOCLOPRAMIDE HCL 5 MG/ML IJ SOLN
10.0000 mg | Freq: Four times a day (QID) | INTRAMUSCULAR | Status: AC
  Administered 2013-12-17 – 2013-12-18 (×4): 10 mg via INTRAVENOUS
  Filled 2013-12-17 (×3): qty 2

## 2013-12-17 MED ORDER — HEMOSTATIC AGENTS (NO CHARGE) OPTIME
TOPICAL | Status: DC | PRN
  Administered 2013-12-17: 1 via TOPICAL

## 2013-12-17 MED ORDER — LACTATED RINGERS IV SOLN
INTRAVENOUS | Status: DC
  Administered 2013-12-17: 20 mL/h via INTRAVENOUS

## 2013-12-17 MED ORDER — SODIUM CHLORIDE 0.45 % IV SOLN
INTRAVENOUS | Status: DC
  Administered 2013-12-17: 20 mL/h via INTRAVENOUS

## 2013-12-17 MED ORDER — MORPHINE SULFATE 2 MG/ML IJ SOLN
1.0000 mg | INTRAMUSCULAR | Status: AC | PRN
  Administered 2013-12-17: 2 mg via INTRAVENOUS
  Filled 2013-12-17: qty 1

## 2013-12-17 MED ORDER — SODIUM CHLORIDE 0.9 % IV SOLN: 250.0000 mL | INTRAVENOUS | Status: DC

## 2013-12-17 MED ORDER — MIDAZOLAM HCL 5 MG/5ML IJ SOLN
INTRAMUSCULAR | Status: DC | PRN
  Administered 2013-12-17: 6 mg via INTRAVENOUS
  Administered 2013-12-17: 4 mg via INTRAVENOUS

## 2013-12-17 MED ORDER — POTASSIUM CHLORIDE 10 MEQ/50ML IV SOLN
10.0000 meq | Freq: Once | INTRAVENOUS | Status: AC
  Administered 2013-12-17: 10 meq via INTRAVENOUS

## 2013-12-17 MED ORDER — MAGNESIUM SULFATE 4000MG/100ML IJ SOLN
4.0000 g | Freq: Once | INTRAMUSCULAR | Status: AC
  Administered 2013-12-17: 4 g via INTRAVENOUS
  Filled 2013-12-17: qty 100

## 2013-12-17 MED ORDER — INSULIN REGULAR BOLUS VIA INFUSION
0.0000 [IU] | Freq: Three times a day (TID) | INTRAVENOUS | Status: DC
  Filled 2013-12-17: qty 10

## 2013-12-17 MED ORDER — THROMBIN 20000 UNITS EX SOLR
CUTANEOUS | Status: AC
  Filled 2013-12-17: qty 20000

## 2013-12-17 MED ORDER — POTASSIUM CHLORIDE 10 MEQ/50ML IV SOLN
10.0000 meq | INTRAVENOUS | Status: AC
  Administered 2013-12-17 (×3): 10 meq via INTRAVENOUS

## 2013-12-17 MED ORDER — ONDANSETRON HCL 4 MG/2ML IJ SOLN
4.0000 mg | Freq: Four times a day (QID) | INTRAMUSCULAR | Status: DC | PRN
  Filled 2013-12-17: qty 2

## 2013-12-17 MED ORDER — METOPROLOL TARTRATE 1 MG/ML IV SOLN: 2.5000 mg | INTRAVENOUS | Status: DC | PRN

## 2013-12-17 SURGICAL SUPPLY — 106 items
ATTRACTOMAT 16X20 MAGNETIC DRP (DRAPES) ×4 IMPLANT
BAG DECANTER FOR FLEXI CONT (MISCELLANEOUS) ×4 IMPLANT
BANDAGE ELASTIC 4 VELCRO ST LF (GAUZE/BANDAGES/DRESSINGS) ×4 IMPLANT
BANDAGE ELASTIC 6 VELCRO ST LF (GAUZE/BANDAGES/DRESSINGS) ×4 IMPLANT
BASKET HEART  (ORDER IN 25'S) (MISCELLANEOUS) ×1
BASKET HEART (ORDER IN 25'S) (MISCELLANEOUS) ×1
BASKET HEART (ORDER IN 25S) (MISCELLANEOUS) ×2 IMPLANT
BENZOIN TINCTURE PRP APPL 2/3 (GAUZE/BANDAGES/DRESSINGS) ×4 IMPLANT
BLADE STERNUM SYSTEM 6 (BLADE) ×4 IMPLANT
BLADE SURG 11 STRL SS (BLADE) ×4 IMPLANT
BNDG GAUZE ELAST 4 BULKY (GAUZE/BANDAGES/DRESSINGS) ×4 IMPLANT
CANISTER SUCTION 2500CC (MISCELLANEOUS) ×4 IMPLANT
CARDIAC SUCTION (MISCELLANEOUS) ×4 IMPLANT
CATH ROBINSON RED A/P 18FR (CATHETERS) ×8 IMPLANT
CATH THORACIC 28FR (CATHETERS) ×4 IMPLANT
CATH THORACIC 36FR (CATHETERS) ×4 IMPLANT
CATH THORACIC 36FR RT ANG (CATHETERS) ×4 IMPLANT
CLIP TI MEDIUM 24 (CLIP) IMPLANT
CLIP TI WIDE RED SMALL 24 (CLIP) IMPLANT
CLOSURE WOUND 1/2 X4 (GAUZE/BANDAGES/DRESSINGS) ×1
COVER SURGICAL LIGHT HANDLE (MISCELLANEOUS) ×4 IMPLANT
CRADLE DONUT ADULT HEAD (MISCELLANEOUS) ×4 IMPLANT
DRAPE CARDIOVASCULAR INCISE (DRAPES) ×2
DRAPE SLUSH/WARMER DISC (DRAPES) ×4 IMPLANT
DRAPE SRG 135X102X78XABS (DRAPES) ×2 IMPLANT
DRSG COVADERM 4X14 (GAUZE/BANDAGES/DRESSINGS) ×4 IMPLANT
ELECT CAUTERY BLADE 6.4 (BLADE) ×4 IMPLANT
ELECT REM PT RETURN 9FT ADLT (ELECTROSURGICAL) ×8
ELECTRODE REM PT RTRN 9FT ADLT (ELECTROSURGICAL) ×4 IMPLANT
GAUZE SPONGE 4X4 12PLY STRL (GAUZE/BANDAGES/DRESSINGS) ×8 IMPLANT
GLOVE BIO SURGEON STRL SZ 6 (GLOVE) ×28 IMPLANT
GLOVE BIO SURGEON STRL SZ 6.5 (GLOVE) ×9 IMPLANT
GLOVE BIO SURGEON STRL SZ7 (GLOVE) IMPLANT
GLOVE BIO SURGEON STRL SZ7.5 (GLOVE) IMPLANT
GLOVE BIO SURGEONS STRL SZ 6.5 (GLOVE) ×3
GLOVE BIOGEL PI IND STRL 6 (GLOVE) ×4 IMPLANT
GLOVE BIOGEL PI IND STRL 6.5 (GLOVE) ×2 IMPLANT
GLOVE BIOGEL PI IND STRL 7.0 (GLOVE) ×6 IMPLANT
GLOVE BIOGEL PI INDICATOR 6 (GLOVE) ×4
GLOVE BIOGEL PI INDICATOR 6.5 (GLOVE) ×2
GLOVE BIOGEL PI INDICATOR 7.0 (GLOVE) ×6
GLOVE EUDERMIC 7 POWDERFREE (GLOVE) ×8 IMPLANT
GLOVE ORTHO TXT STRL SZ7.5 (GLOVE) IMPLANT
GOWN STRL REUS W/ TWL LRG LVL3 (GOWN DISPOSABLE) ×16 IMPLANT
GOWN STRL REUS W/ TWL XL LVL3 (GOWN DISPOSABLE) ×2 IMPLANT
GOWN STRL REUS W/TWL LRG LVL3 (GOWN DISPOSABLE) ×16
GOWN STRL REUS W/TWL XL LVL3 (GOWN DISPOSABLE) ×2
HEMOSTAT POWDER SURGIFOAM 1G (HEMOSTASIS) ×12 IMPLANT
HEMOSTAT SURGICEL 2X14 (HEMOSTASIS) ×4 IMPLANT
INSERT FOGARTY 61MM (MISCELLANEOUS) IMPLANT
INSERT FOGARTY XLG (MISCELLANEOUS) IMPLANT
KIT BASIN OR (CUSTOM PROCEDURE TRAY) ×4 IMPLANT
KIT CATH CPB BARTLE (MISCELLANEOUS) ×4 IMPLANT
KIT ROOM TURNOVER OR (KITS) ×4 IMPLANT
KIT SUCTION CATH 14FR (SUCTIONS) ×4 IMPLANT
KIT VASOVIEW W/TROCAR VH 2000 (KITS) ×4 IMPLANT
NS IRRIG 1000ML POUR BTL (IV SOLUTION) ×20 IMPLANT
PACK OPEN HEART (CUSTOM PROCEDURE TRAY) ×4 IMPLANT
PAD ARMBOARD 7.5X6 YLW CONV (MISCELLANEOUS) ×8 IMPLANT
PAD ELECT DEFIB RADIOL ZOLL (MISCELLANEOUS) ×4 IMPLANT
PENCIL BUTTON HOLSTER BLD 10FT (ELECTRODE) ×4 IMPLANT
PUNCH AORTIC ROTATE 4.0MM (MISCELLANEOUS) IMPLANT
PUNCH AORTIC ROTATE 4.5MM 8IN (MISCELLANEOUS) ×4 IMPLANT
PUNCH AORTIC ROTATE 5MM 8IN (MISCELLANEOUS) IMPLANT
SENSOR MYOCARDIAL TEMP (MISCELLANEOUS) ×4 IMPLANT
SET CARDIOPLEGIA MPS 5001102 (MISCELLANEOUS) ×4 IMPLANT
SPONGE GAUZE 4X4 12PLY STER LF (GAUZE/BANDAGES/DRESSINGS) ×8 IMPLANT
SPONGE INTESTINAL PEANUT (DISPOSABLE) IMPLANT
SPONGE LAP 18X18 X RAY DECT (DISPOSABLE) IMPLANT
SPONGE LAP 4X18 X RAY DECT (DISPOSABLE) ×4 IMPLANT
STRIP CLOSURE SKIN 1/2X4 (GAUZE/BANDAGES/DRESSINGS) ×3 IMPLANT
SUT BONE WAX W31G (SUTURE) ×4 IMPLANT
SUT MNCRL AB 4-0 PS2 18 (SUTURE) ×4 IMPLANT
SUT PROLENE 3 0 SH DA (SUTURE) IMPLANT
SUT PROLENE 3 0 SH1 36 (SUTURE) ×4 IMPLANT
SUT PROLENE 4 0 RB 1 (SUTURE)
SUT PROLENE 4 0 SH DA (SUTURE) IMPLANT
SUT PROLENE 4-0 RB1 .5 CRCL 36 (SUTURE) IMPLANT
SUT PROLENE 5 0 C 1 36 (SUTURE) IMPLANT
SUT PROLENE 6 0 C 1 30 (SUTURE) IMPLANT
SUT PROLENE 7 0 BV 1 (SUTURE) ×4 IMPLANT
SUT PROLENE 7 0 BV1 MDA (SUTURE) ×4 IMPLANT
SUT PROLENE 8 0 BV175 6 (SUTURE) IMPLANT
SUT SILK  1 MH (SUTURE)
SUT SILK 1 MH (SUTURE) IMPLANT
SUT STEEL 6MS V (SUTURE) ×8 IMPLANT
SUT STEEL STERNAL CCS#1 18IN (SUTURE) ×8 IMPLANT
SUT STEEL SZ 6 DBL 3X14 BALL (SUTURE) IMPLANT
SUT VIC AB 1 CTX 36 (SUTURE) ×8
SUT VIC AB 1 CTX36XBRD ANBCTR (SUTURE) ×8 IMPLANT
SUT VIC AB 2-0 CT1 27 (SUTURE) ×2
SUT VIC AB 2-0 CT1 TAPERPNT 27 (SUTURE) ×2 IMPLANT
SUT VIC AB 2-0 CTX 27 (SUTURE) IMPLANT
SUT VIC AB 3-0 SH 27 (SUTURE)
SUT VIC AB 3-0 SH 27X BRD (SUTURE) IMPLANT
SUT VIC AB 3-0 X1 27 (SUTURE) IMPLANT
SUT VICRYL 4-0 PS2 18IN ABS (SUTURE) IMPLANT
SUTURE E-PAK OPEN HEART (SUTURE) ×4 IMPLANT
SYSTEM SAHARA CHEST DRAIN ATS (WOUND CARE) ×4 IMPLANT
TAPE CLOTH SURG 4X10 WHT LF (GAUZE/BANDAGES/DRESSINGS) ×8 IMPLANT
TOWEL OR 17X24 6PK STRL BLUE (TOWEL DISPOSABLE) ×4 IMPLANT
TOWEL OR 17X26 10 PK STRL BLUE (TOWEL DISPOSABLE) ×4 IMPLANT
TRAY FOLEY IC TEMP SENS 16FR (CATHETERS) ×4 IMPLANT
TUBING INSUFFLATION 10FT LAP (TUBING) ×4 IMPLANT
UNDERPAD 30X30 INCONTINENT (UNDERPADS AND DIAPERS) ×4 IMPLANT
WATER STERILE IRR 1000ML POUR (IV SOLUTION) ×8 IMPLANT

## 2013-12-17 NOTE — Anesthesia Preprocedure Evaluation (Addendum)
Anesthesia Evaluation  Patient identified by MRN, date of birth, ID band Patient awake    Reviewed: Allergy & Precautions, H&P , NPO status , Patient's Chart, lab work & pertinent test results, reviewed documented beta blocker date and time   History of Anesthesia Complications Negative for: history of anesthetic complications  Airway Mallampati: III TM Distance: >3 FB Neck ROM: Full    Dental  (+) Teeth Intact, Dental Advisory Given   Pulmonary neg pulmonary ROS,  breath sounds clear to auscultation        Cardiovascular hypertension, Pt. on medications and Pt. on home beta blockers + angina + CAD, + Cardiac Stents and + Peripheral Vascular Disease Rhythm:Regular Rate:Normal  8/15 ECHO: EF 65%, aortic sclerosis without stenosis   Neuro/Psych CVA (aphasia and R hemiparesis), Residual Symptoms    GI/Hepatic Neg liver ROS, GERD-  Medicated and Controlled,  Endo/Other  negative endocrine ROS  Renal/GU negative Renal ROS     Musculoskeletal   Abdominal   Peds  Hematology negative hematology ROS (+)   Anesthesia Other Findings   Reproductive/Obstetrics                       Anesthesia Physical Anesthesia Plan  ASA: III  Anesthesia Plan: General   Post-op Pain Management:    Induction: Intravenous  Airway Management Planned: Oral ETT  Additional Equipment: Arterial line, PA Cath, TEE and Ultrasound Guidance Line Placement  Intra-op Plan:   Post-operative Plan: Post-operative intubation/ventilation  Informed Consent: I have reviewed the patients History and Physical, chart, labs and discussed the procedure including the risks, benefits and alternatives for the proposed anesthesia with the patient or authorized representative who has indicated his/her understanding and acceptance.   Dental advisory given  Plan Discussed with: CRNA and Surgeon  Anesthesia Plan Comments: (Plan routine  monitors, A line, PA cath, GETA with TEE and post op ventilation )        Anesthesia Quick Evaluation

## 2013-12-17 NOTE — Progress Notes (Signed)
TCTS BRIEF SICU PROGRESS NOTE  Day of Surgery  S/P Procedure(s) (LRB): CORONARY ARTERY BYPASS GRAFTING (CABG) (N/A) INTRAOPERATIVE TRANSESOPHAGEAL ECHOCARDIOGRAM (N/A)   Slowly waking up on vent AAI paced w/ stable hemodynamics Chest tube output low UOP > 100 mL/hr Labs okay  Plan: Continue routine early postop  OWEN,CLARENCE H 12/17/2013 6:57 PM

## 2013-12-17 NOTE — Brief Op Note (Signed)
12/09/2013 - 12/17/2013  10:24 AM  PATIENT:  Kristin [name]  73 y.o. female  PRE-OPERATIVE DIAGNOSIS:  CAD  POST-OPERATIVE DIAGNOSIS:  CAD  PROCEDURE:   CORONARY ARTERY BYPASS GRAFTING x 2 (LIMA-LAD, SVG-PD) ENDOSCOPIC VEIN HARVEST RIGHT THIGH  SURGEON:  Surgeon(s): Gaye Pollack, MD  ASSISTANT: Suzzanne Cloud, PA-C  ANESTHESIA:   general  PATIENT CONDITION:  ICU - intubated and hemodynamically stable.  PRE-OPERATIVE WEIGHT: 82 kg

## 2013-12-17 NOTE — Op Note (Signed)
CARDIOVASCULAR SURGERY OPERATIVE NOTE  12/17/2013  Surgeon:  Gaye Pollack, MD  First Assistant: Suzzanne Cloud,  PA-C   Preoperative Diagnosis:  Severe multi-vessel coronary artery disease   Postoperative Diagnosis:  Same   Procedure:  1. Median Sternotomy 2. Extracorporeal circulation 3.   Coronary artery bypass grafting x 2   Left internal mammary graft to the LAD  SVG to PDA   4.   Endoscopic vein harvest from the right leg   Anesthesia:  General Endotracheal   Clinical History/Surgical Indication:  The patient is a 73 year old woman with a history of hypertension, CAD s/p PCI and stenting of the RCA in 2010 in [place], carotid disease s/p stroke in 2001 with persistent expressive aphasia and right hemiparesis. History is somewhat difficult due to aphasia but her sister and husband are here to fill in the gaps. They report a history of heartburn symptoms for a while that she thought was due to reflux. Then on the morning of 8/20 she developed severe substernal and epigastric pain. She has not had cardiology follow up since moving to [place] 2 years ago. On presentation her troponin was negative and ECG showed T wave inversions in V2-V3 with no old ECG. She had a Lexiscan Myoview that showed stress induced ischemia of the apex extending into the septum and inferior walls with an EF of 60%. Cath today shows a 95% ostial LAD stenosis. The LCX is large with no significant stenosis. The RCA is a large dominant vessel with a patent proximal stent followed by a long segment 95% mid to distal stenosis. The stent in the distal RCA beyond this is patent with a large PDA with bidirectional flow. The PL is occluded.  She has severe 2 vessel coronary disease with a high grade ostial LAD lesion and a long mid to distal RCA lesion. I agree that PCI is not an option given her disease location. I think  CABG is the best treatment for her although her operative risk is increased due to her prior stroke and disability. She has been on Plavix which has been stopped. We will check P2Y12 tomorrow. I would plan to do later this week if platelet function is ok. I discussed the operative procedure with the patient and family including alternatives, benefits and risks; including but not limited to bleeding, blood transfusion, infection, stroke, myocardial infarction, graft failure, heart block requiring a permanent pacemaker, organ dysfunction, and death. Kristin [name] understands and agrees to proceed.    Preparation:  The patient was seen in the preoperative holding area and the correct patient, correct operation were confirmed with the patient after reviewing the medical record and catheterization. The consent was signed by me. Preoperative antibiotics were given. A pulmonary arterial line and radial arterial line were placed by the anesthesia team. The patient was taken back to the operating room and positioned supine on the operating room table. After being placed under general endotracheal anesthesia by the anesthesia team a foley catheter was placed. The neck, chest, abdomen, and both legs were prepped with betadine soap and solution and draped in the usual sterile manner. A surgical time-out was taken and the correct patient and operative procedure were confirmed with the nursing and anesthesia staff.   Cardiopulmonary Bypass:  A median sternotomy was performed. The pericardium was opened in the midline. Right ventricular function appeared normal. The ascending aorta was of normal size and had no palpable plaque. There were no contraindications to aortic cannulation or cross-clamping. The  patient was fully systemically heparinized and the ACT was maintained > 400 sec. The proximal aortic arch was cannulated with a 20 F aortic cannula for arterial inflow. Venous cannulation was performed via the right atrial  appendage using a two-staged venous cannula. An antegrade cardioplegia/vent cannula was inserted into the mid-ascending aorta. Aortic occlusion was performed with a single cross-[name]. Systemic cooling to 32 degrees Centigrade and topical cooling of the heart with iced saline were used. Hyperkalemic antegrade cold blood cardioplegia was used to induce diastolic arrest and was then given at about 20 minute intervals throughout the period of arrest to maintain myocardial temperature at or below 10 degrees centigrade. A temperature probe was inserted into the interventricular septum and an insulating pad was placed in the pericardium.   Left internal mammary harvest:  The left side of the sternum was retracted using the Rultract retractor. The left internal mammary artery was harvested as a pedicle graft. All side branches were clipped. It was a medium-sized vessel of good quality with excellent blood flow. It was ligated distally and divided. It was sprayed with topical papaverine solution to prevent vasospasm.   Endoscopic vein harvest:  The right greater saphenous vein was harvested endoscopically through a 2 cm incision medial to the right knee. It was harvested from the upper thigh to below the knee. It was a medium-sized vein of good quality. The side branches were all ligated with 4-0 silk ties.    Coronary arteries:  The coronary arteries were examined.   LAD:  Intramyocardial throughout its proximal and mid portions. Distally it is visible on the surface of the heart but is very small and not graftable there. It was located in the proximal to mid portion in the muscle where it was medium sized and free of disease.  LCX:  No disease on cath. Mild segmental plaque on exam.  RCA:  Diffusely diseased with stents palplable. The PDA is small but graftable proximally. The PL branches are tiny.   Grafts:  1. LIMA to the LAD: 1.75 mm. It was sewn end to side using 8-0 prolene continuous  suture. 2. SVG to PDA:  1.5 mm. It was sewn end to side using 7-0 prolene continuous suture.    The proximal vein graft anastomosis was performed to the mid-ascending aorta using continuous 6-0 prolene suture. A graft marker was placed around the proximal anastomosis.   Completion:  The patient was rewarmed to 37 degrees Centigrade. The [name] was removed from the LIMA pedicle and there was rapid warming of the septum and return of ventricular fibrillation. The crossclamp was removed with a time of 53 minutes. There was spontaneous return of sinus rhythm. The distal and proximal anastomoses were checked for hemostasis. The position of the grafts was satisfactory. Two temporary epicardial pacing wires were placed on the right atrium and two on the right ventricle. The patient was weaned from CPB without difficulty on no inotropes. CPB time was 69 minutes. Cardiac output was 4 LPM. Heparin was fully reversed with protamine and the aortic and venous cannulas removed. Hemostasis was achieved. Mediastinal and left pleural drainage tubes were placed. The sternum was closed with  #6 stainless steel wires. The fascia was closed with continuous # 1 vicryl suture. The subcutaneous tissue was closed with 2-0 vicryl continuous suture. The skin was closed with 3-0 vicryl subcuticular suture. All sponge, needle, and instrument counts were reported correct at the end of the case. Dry sterile dressings were placed over the incisions and  around the chest tubes which were connected to pleurevac suction. The patient was then transported to the surgical intensive care unit in critical but stable condition.

## 2013-12-17 NOTE — Progress Notes (Signed)
Attempted wean again, back up ventilator still kicking in due to low respiratory rate. RN aware. Will attempt again later.

## 2013-12-17 NOTE — Progress Notes (Signed)
  Echocardiogram Echocardiogram Transesophageal has been performed.  Kristin [name] 12/17/2013, 8:47 AM

## 2013-12-17 NOTE — OR Nursing (Signed)
SICU first call @ 1047

## 2013-12-17 NOTE — Procedures (Signed)
Extubation Procedure Note  Patient Details:   Name: Kristin [name] DOB: 11/03/1940 MRN: [id number]   Airway Documentation:  Airway 8 mm (Active)  Secured at (cm) 22 cm 12/17/2013  8:27 PM  Measured From Lips 12/17/2013  8:27 PM  Secured Location Right 12/17/2013  8:27 PM  Secured By Pink Tape 12/17/2013  8:27 PM  Site Condition Dry 12/17/2013  8:27 PM    Evaluation  O2 sats: stable throughout Complications: No apparent complications Patient did tolerate procedure well. Bilateral Breath Sounds: Clear Suctioning: Airway Yes, pt vocalized in hoarse voice name. Pt also coughed to try and clear secretions. Rt will continue to monitor pt through out the night.  Virgilio Frees 12/17/2013, 9:49 PM

## 2013-12-17 NOTE — Progress Notes (Signed)
Placed  Pt on wean settings at 2025, pt seems to be tolerating as well as possible. RT will continue to monitor.

## 2013-12-17 NOTE — Transfer of Care (Signed)
Immediate Anesthesia Transfer of Care Note  Patient: Kristin [name]  Procedure(s) Performed: Procedure(s) with comments: CORONARY ARTERY BYPASS GRAFTING (CABG) (N/A) - Times 2 using left internal mammary artery and endoscopically harvested right saphenous vein INTRAOPERATIVE TRANSESOPHAGEAL ECHOCARDIOGRAM (N/A)  Patient Location: SICU  Anesthesia Type:General  Level of Consciousness: sedated and unresponsive  Airway & Oxygen Therapy: Patient remains intubated per anesthesia plan and Patient placed on Ventilator (see vital sign flow sheet for setting)  Post-op Assessment: Post -op Vital signs reviewed and stable  Post vital signs: Reviewed and stable  Complications: No apparent anesthesia complications

## 2013-12-17 NOTE — Anesthesia Postprocedure Evaluation (Signed)
  Anesthesia Post-op Note  Patient: Kristin [name]  Procedure(s) Performed: Procedure(s) with comments: CORONARY ARTERY BYPASS GRAFTING (CABG) (N/A) - Times 2 using left internal mammary artery and endoscopically harvested right saphenous vein INTRAOPERATIVE TRANSESOPHAGEAL ECHOCARDIOGRAM (N/A)  Patient Location: SICU  Anesthesia Type:General  Level of Consciousness: sedated and unresponsive  Airway and Oxygen Therapy: Patient remains intubated per anesthesia plan and Patient placed on Ventilator (see vital sign flow sheet for setting)  Post-op Pain: none  Post-op Assessment: Post-op Vital signs reviewed, Patient's Cardiovascular Status Stable, Respiratory Function Stable, Patent Airway and No signs of Nausea or vomiting  Post-op Vital Signs: Reviewed and stable  Last Vitals:  Filed Vitals:   12/17/13 1145  BP: 118/67  Pulse: 80  Temp:   Resp: 12    Complications: No apparent anesthesia complications

## 2013-12-17 NOTE — Progress Notes (Signed)
Pt. Did well with CPAP/PS wean trial as well as NIF and VC. Pt had a NIF of -40 and a VC of 0.7L. Both were repeated several times and pt stayed consistent with a -40 NIF and VC ranged from 0.4 to 0.7. Pt is now resting comfortably on [place] of 4 L. No complications through extubation process. RT will continue to monitor

## 2013-12-17 NOTE — Progress Notes (Signed)
Initiated rapid wean, however, patients MVe was barely 3.0 when there was no stimulation to patient. Will reattempt again in 30 minutes.

## 2013-12-18 ENCOUNTER — Inpatient Hospital Stay (HOSPITAL_COMMUNITY): Payer: Medicare PPO

## 2013-12-18 LAB — CBC
HCT: 31.4 % — ABNORMAL LOW (ref 36.0–46.0)
HCT: 34.3 % — ABNORMAL LOW (ref 36.0–46.0)
Hemoglobin: 10 g/dL — ABNORMAL LOW (ref 12.0–15.0)
Hemoglobin: 10.9 g/dL — ABNORMAL LOW (ref 12.0–15.0)
MCH: 27.7 pg (ref 26.0–34.0)
MCH: 28.4 pg (ref 26.0–34.0)
MCHC: 31.8 g/dL (ref 30.0–36.0)
MCHC: 31.8 g/dL (ref 30.0–36.0)
MCV: 87 fL (ref 78.0–100.0)
MCV: 89.3 fL (ref 78.0–100.0)
Platelets: 202 10*3/uL (ref 150–400)
Platelets: 176 10*3/uL (ref 150–400)
RBC: 3.61 MIL/uL — ABNORMAL LOW (ref 3.87–5.11)
RBC: 3.84 MIL/uL — ABNORMAL LOW (ref 3.87–5.11)
RDW: 14.7 % (ref 11.5–15.5)
RDW: 15 % (ref 11.5–15.5)
WBC: 8.2 10*3/uL (ref 4.0–10.5)
WBC: 6.6 10*3/uL (ref 4.0–10.5)

## 2013-12-18 LAB — PREPARE PLATELET PHERESIS: Unit division: 0

## 2013-12-18 LAB — GLUCOSE, CAPILLARY
Glucose-Capillary: 100 mg/dL — ABNORMAL HIGH (ref 70–99)
Glucose-Capillary: 105 mg/dL — ABNORMAL HIGH (ref 70–99)
Glucose-Capillary: 105 mg/dL — ABNORMAL HIGH (ref 70–99)
Glucose-Capillary: 106 mg/dL — ABNORMAL HIGH (ref 70–99)
Glucose-Capillary: 110 mg/dL — ABNORMAL HIGH (ref 70–99)
Glucose-Capillary: 111 mg/dL — ABNORMAL HIGH (ref 70–99)
Glucose-Capillary: 115 mg/dL — ABNORMAL HIGH (ref 70–99)
Glucose-Capillary: 116 mg/dL — ABNORMAL HIGH (ref 70–99)
Glucose-Capillary: 116 mg/dL — ABNORMAL HIGH (ref 70–99)
Glucose-Capillary: 119 mg/dL — ABNORMAL HIGH (ref 70–99)
Glucose-Capillary: 122 mg/dL — ABNORMAL HIGH (ref 70–99)
Glucose-Capillary: 122 mg/dL — ABNORMAL HIGH (ref 70–99)
Glucose-Capillary: 129 mg/dL — ABNORMAL HIGH (ref 70–99)
Glucose-Capillary: 140 mg/dL — ABNORMAL HIGH (ref 70–99)
Glucose-Capillary: 144 mg/dL — ABNORMAL HIGH (ref 70–99)
Glucose-Capillary: 99 mg/dL (ref 70–99)

## 2013-12-18 LAB — BASIC METABOLIC PANEL
Anion gap: 10 (ref 5–15)
BUN: 9 mg/dL (ref 6–23)
CO2: 24 mEq/L (ref 19–32)
Calcium: 7.6 mg/dL — ABNORMAL LOW (ref 8.4–10.5)
Chloride: 107 mEq/L (ref 96–112)
Creatinine, Ser: 0.7 mg/dL (ref 0.50–1.10)
GFR calc Af Amer: >90 mL/min (ref >90)
GFR calc non Af Amer: 83 mL/min — ABNORMAL LOW (ref >90)
Glucose, Bld: 142 mg/dL — ABNORMAL HIGH (ref 70–99)
Potassium: 4.1 mEq/L (ref 3.7–5.3)
Sodium: 141 mEq/L (ref 137–147)

## 2013-12-18 LAB — CREATININE, SERUM
Creatinine, Ser: 0.57 mg/dL (ref 0.50–1.10)
GFR calc Af Amer: >90 mL/min (ref >90)
GFR calc non Af Amer: 88 mL/min — ABNORMAL LOW (ref >90)

## 2013-12-18 LAB — POCT I-STAT, CHEM 8
BUN: 8 mg/dL (ref 6–23)
Calcium, Ion: 1.16 mmol/L (ref 1.13–1.30)
Chloride: 107 mEq/L (ref 96–112)
Creatinine, Ser: 0.6 mg/dL (ref 0.50–1.10)
Glucose, Bld: 123 mg/dL — ABNORMAL HIGH (ref 70–99)
HCT: 33 % — ABNORMAL LOW (ref 36.0–46.0)
Hemoglobin: 11.2 g/dL — ABNORMAL LOW (ref 12.0–15.0)
Potassium: 4 mEq/L (ref 3.7–5.3)
Sodium: 138 mEq/L (ref 137–147)
TCO2: 22 mmol/L (ref 0–100)

## 2013-12-18 LAB — MAGNESIUM
Magnesium: 2.7 mg/dL — ABNORMAL HIGH (ref 1.5–2.5)
Magnesium: 2.5 mg/dL (ref 1.5–2.5)

## 2013-12-18 MED ORDER — FUROSEMIDE 10 MG/ML IJ SOLN
20.0000 mg | Freq: Once | INTRAMUSCULAR | Status: AC
  Administered 2013-12-18: 20 mg via INTRAVENOUS
  Filled 2013-12-18: qty 2

## 2013-12-18 MED ORDER — CETYLPYRIDINIUM CHLORIDE 0.05 % MT LIQD: 7.0000 mL | Freq: Two times a day (BID) | OROMUCOSAL | Status: DC

## 2013-12-18 MED ORDER — INSULIN ASPART 100 UNIT/ML ~~LOC~~ SOLN
0.0000 [IU] | SUBCUTANEOUS | Status: DC
Start: 2013-12-18 — End: 2013-12-18
  Administered 2013-12-18: 2 [IU] via SUBCUTANEOUS

## 2013-12-18 MED ORDER — MORPHINE SULFATE 2 MG/ML IJ SOLN
2.0000 mg | INTRAMUSCULAR | Status: DC | PRN
  Administered 2013-12-18 (×2): 2 mg via INTRAVENOUS
  Filled 2013-12-18 (×2): qty 1

## 2013-12-18 MED ORDER — INSULIN ASPART 100 UNIT/ML ~~LOC~~ SOLN
0.0000 [IU] | SUBCUTANEOUS | Status: DC
  Administered 2013-12-18 – 2013-12-19 (×3): 2 [IU] via SUBCUTANEOUS

## 2013-12-18 NOTE — Progress Notes (Signed)
[address]       [place] [address]             [phone]        CARDIOTHORACIC SURGERY PROGRESS NOTE   R1 Day Post-Op Procedure(s) (LRB): CORONARY ARTERY BYPASS GRAFTING (CABG) (N/A) INTRAOPERATIVE TRANSESOPHAGEAL ECHOCARDIOGRAM (N/A)  Subjective: Looks good.  Mild soreness in chest.  Denies SOB, nausea.  Objective: Vital signs: BP Readings from Last 1 Encounters:  12/18/13 90/65   Pulse Readings from Last 1 Encounters:  12/18/13 80   Resp Readings from Last 1 Encounters:  12/18/13 19   Temp Readings from Last 1 Encounters:  12/18/13 98.4 F (36.9 C)     Hemodynamics: PAP: (22-42)/(9-24) 40/18 mmHg CO:  [4 L/min-5.9 L/min] 5.8 L/min CI:  [2.1 L/min/m2-3.1 L/min/m2] 3 L/min/m2  Physical Exam:  Rhythm:   Sinus - AAI paced  Breath sounds: clear  Heart sounds:  RRR  Incisions:  Dressings dry, intact  Abdomen:  Soft, non-distended, non-tender  Extremities:  Warm, well-perfused    Intake/Output from previous day: 08/28 0701 - 08/29 0700 In: 6639.1 [I.V.:3635.1; Blood:1104; IV QMVHQIONG:2952] Out: 8413 [Urine:5480; Blood:2319; Chest Tube:340] Intake/Output this shift:    Lab Results:  CBC: Recent Labs  12/17/13 1755 12/17/13 1756 12/18/13 0400  WBC 8.0  --  8.2  HGB 10.9* 11.2* 10.0*  HCT 32.9* 33.0* 31.4*  PLT 177  --  202    BMET:  Recent Labs  12/17/13 0420  12/17/13 1756 12/18/13 0400  NA 141  < > 143 141  K 3.8  < > 3.7 4.1  CL 106  < > 106 107  CO2 22  --   --  24  GLUCOSE 118*  < > 95 142*  BUN 13  < > 7 9  CREATININE 0.79  < > 0.60 0.70  CALCIUM 8.7  --   --  7.6*  < > = values in this interval not displayed.   CBG (last 3)   Recent Labs  12/18/13 0007 12/18/13 0314 12/18/13 0746  GLUCAP 105* 122* 116*    ABG    Component Value Date/Time   PHART 7.358 12/17/2013 2254   PCO2ART 45.6* 12/17/2013 2254   PO2ART 87.0 12/17/2013 2254   HCO3 25.4* 12/17/2013 2254   TCO2 27 12/17/2013 2254   ACIDBASEDEF 1.0 12/17/2013 2055   O2SAT 96.0 12/17/2013 2254    CXR: PORTABLE CHEST - 1 VIEW  COMPARISON: 12/17/2013  FINDINGS:  The endotracheal tube and nasogastric catheter been removed in the  interval. The Swan-Ganz catheter remains in the pulmonary outflow  tract. Mediastinal drain and left thoracostomy catheter are again  seen and stable. No pneumothorax is noted. Small bilateral pleural  effusions are seen. No focal confluent infiltrate is noted.  IMPRESSION:  Tubes and lines as described.  Small bilateral pleural effusions.  Electronically Signed  By: Inez Catalina M.D.  On: 12/18/2013 07:49   Assessment/Plan: S/P Procedure(s) (LRB): CORONARY ARTERY BYPASS GRAFTING (CABG) (N/A) INTRAOPERATIVE TRANSESOPHAGEAL ECHOCARDIOGRAM (N/A)  Doing well POD1 Maintaining NSR w/ stable hemodynamics on low dose Neo drip for BP Expected post op acute blood loss anemia, stable Expected post op volume excess, mild Expected post op atelectasis, mild, L>R Remote h/o stroke w/ right spastic hemiplegia and aphasia   Mobilize  D/C tubes and lines  Wean Neo drip as tolerated  Hold diuretics until BP increased some  Will need PT consult   Rin Gorton H 12/18/2013 9:14 AM

## 2013-12-18 NOTE — Progress Notes (Signed)
TCTS BRIEF SICU PROGRESS NOTE  1 Day Post-Op  S/P Procedure(s) (LRB): CORONARY ARTERY BYPASS GRAFTING (CABG) (N/A) INTRAOPERATIVE TRANSESOPHAGEAL ECHOCARDIOGRAM (N/A)   Stable day Up in chair, worked with PT NSR w/ stable BP off Neo UOP adequate  Plan: Continue current plan  OWEN,CLARENCE H 12/18/2013 5:30 PM

## 2013-12-18 NOTE — Evaluation (Signed)
Physical Therapy Evaluation Patient Details Name: Kristin [name] MRN: [id number] DOB: 13-Aug-1940 Today's Date: 12/18/2013   History of Present Illness    73 y/o F with h/o CAD (hx of 2 stents ? 2010 at [place]), CVA (2001) with resultant Rt spastic hemiplegia and aphasia, HTN, HL, and GERD who presented to [facility] on 12/09/13 with sudden onset of epigastric and substernal chest pain without radiation. She has been pain free since her admission. Troponin negative x 3. NSR, 68, TWI v2-v3. Lexiscan Myoview on 8/21 high risk with stress-induced ischemia in the apex extending into the septum and inferior walls, septal HK, EF 60%. Echo nl. Cardiac Cath 8/24 with multivessel disease - Ostial LAD and RCA; referred for CABG after Plavix washout    Clinical Impression  Pt admitted with above. Pt currently with functional limitations due to the deficits listed below (see PT Problem List).  Pt will benefit from skilled PT to increase their independence and safety with mobility to allow discharge to the venue listed below.    Follow Up Recommendations CIR;Supervision/Assistance - 24 hour    Equipment Recommendations  Other (comment) (TBA)    Recommendations for Other Services Rehab consult     Precautions / Restrictions Precautions Precautions: Fall;Sternal Restrictions Weight Bearing Restrictions: No Other Position/Activity Restrictions: sternal precautions      Mobility  Bed Mobility Overal bed mobility: Needs Assistance;+2 for physical assistance Bed Mobility: Supine to Sit     Supine to sit: Max assist;+2 for physical assistance     General bed mobility comments: Pt needed cues and assist to transition to EOB.    Transfers Overall transfer level: Needs assistance Equipment used: 2 person hand held assist Transfers: Sit to/from [facility] Sit to Stand: Max assist;+2 physical assistance Stand pivot transfers: Max assist;+2 physical assistance       General transfer  comment: Pt needed incr assist to stand from sitting but was able to stand upright.  Needed asssit to weight shift and move LEs around to chair.  Needed assist to control descent into chair.    Ambulation/Gait                Stairs            Wheelchair Mobility    Modified Rankin (Stroke Patients Only)       Balance Overall balance assessment: Needs assistance;History of Falls Sitting-balance support: No upper extremity supported;Feet supported Sitting balance-Leahy Scale: Fair     Standing balance support: Bilateral upper extremity supported;During functional activity Standing balance-Leahy Scale: Poor Standing balance comment: Needs UE support bil to stand.                             Pertinent Vitals/Pain Pain Assessment: No/denies pain VSS    Home Living Family/patient expects to be discharged to:: Private residence Living Arrangements: Spouse/significant other Available Help at Discharge: Family;Available 24 hours/day             Additional Comments: Pt unable to state due to expressive aphasia.    Prior Function           Comments: unable to assess due to aphasia.  Note in chart states she walked 80 feet with rollator with Cardiac REhab prior to sx.     Hand Dominance        Extremity/Trunk Assessment   Upper Extremity Assessment: Defer to OT evaluation  Lower Extremity Assessment: RLE deficits/detail RLE Deficits / Details: grossly 2+/5    Cervical / Trunk Assessment: Kyphotic  Communication   Communication: Expressive difficulties  Cognition Arousal/Alertness: Awake/alert Behavior During Therapy: Flat affect Overall Cognitive Status: Difficult to assess                      General Comments      Exercises General Exercises - Lower Extremity Ankle Circles/Pumps: AROM;Both;5 reps;Supine Long Arc Quad: AROM;Both;10 reps;Seated      Assessment/Plan    PT Assessment Patient needs  continued PT services  PT Diagnosis Generalized weakness   PT Problem List Decreased balance;Decreased mobility;Decreased strength;Decreased activity tolerance;Decreased knowledge of use of DME;Decreased safety awareness;Decreased knowledge of precautions  PT Treatment Interventions DME instruction;Gait training;Functional mobility training;Therapeutic activities;Therapeutic exercise;Balance training;Patient/family education   PT Goals (Current goals can be found in the Care Plan section) Acute Rehab PT Goals Patient Stated Goal: to go home PT Goal Formulation: With patient Time For Goal Achievement: 01/01/14 Potential to Achieve Goals: Good    Frequency Min 3X/week   Barriers to discharge        Co-evaluation               End of Session Equipment Utilized During Treatment: Gait belt;Oxygen Activity Tolerance: Patient limited by fatigue Patient left: in chair;with call bell/phone within reach Nurse Communication: Mobility status;Need for lift equipment         Time: 4680-3212 PT Time Calculation (min): 18 min   Charges:   PT Evaluation $Initial PT Evaluation Tier I: 1 Procedure PT Treatments $Therapeutic Activity: 8-22 mins   PT G Codes:          INGOLD,Starla Deller 2013/12/31, 4:26 PM [facility] Acute Rehabilitation [phone] (pager)

## 2013-12-19 ENCOUNTER — Inpatient Hospital Stay (HOSPITAL_COMMUNITY): Payer: Medicare PPO

## 2013-12-19 LAB — CBC
HCT: 31.1 % — ABNORMAL LOW (ref 36.0–46.0)
Hemoglobin: 10.1 g/dL — ABNORMAL LOW (ref 12.0–15.0)
MCH: 28.8 pg (ref 26.0–34.0)
MCHC: 32.5 g/dL (ref 30.0–36.0)
MCV: 88.6 fL (ref 78.0–100.0)
Platelets: 161 10*3/uL (ref 150–400)
RBC: 3.51 MIL/uL — ABNORMAL LOW (ref 3.87–5.11)
RDW: 15.1 % (ref 11.5–15.5)
WBC: 6.5 10*3/uL (ref 4.0–10.5)

## 2013-12-19 LAB — BASIC METABOLIC PANEL
Anion gap: 11 (ref 5–15)
BUN: 10 mg/dL (ref 6–23)
CO2: 25 mEq/L (ref 19–32)
Calcium: 8 mg/dL — ABNORMAL LOW (ref 8.4–10.5)
Chloride: 105 mEq/L (ref 96–112)
Creatinine, Ser: 0.64 mg/dL (ref 0.50–1.10)
GFR calc Af Amer: >90 mL/min (ref >90)
GFR calc non Af Amer: 85 mL/min — ABNORMAL LOW (ref >90)
Glucose, Bld: 118 mg/dL — ABNORMAL HIGH (ref 70–99)
Potassium: 3.9 mEq/L (ref 3.7–5.3)
Sodium: 141 mEq/L (ref 137–147)

## 2013-12-19 LAB — GLUCOSE, CAPILLARY
Glucose-Capillary: 123 mg/dL — ABNORMAL HIGH (ref 70–99)
Glucose-Capillary: 129 mg/dL — ABNORMAL HIGH (ref 70–99)

## 2013-12-19 MED ORDER — TRAMADOL HCL 50 MG PO TABS: 50.0000 mg | ORAL_TABLET | ORAL | Status: DC | PRN

## 2013-12-19 MED ORDER — SODIUM CHLORIDE 0.9 % IJ SOLN
3.0000 mL | Freq: Two times a day (BID) | INTRAMUSCULAR | Status: DC
  Administered 2013-12-19 – 2013-12-20 (×2): 3 mL via INTRAVENOUS

## 2013-12-19 MED ORDER — POTASSIUM CHLORIDE CRYS ER 20 MEQ PO TBCR
20.0000 meq | EXTENDED_RELEASE_TABLET | Freq: Every day | ORAL | Status: DC
  Administered 2013-12-19 – 2013-12-20 (×2): 20 meq via ORAL
  Filled 2013-12-19 (×3): qty 1

## 2013-12-19 MED ORDER — FUROSEMIDE 40 MG PO TABS
40.0000 mg | ORAL_TABLET | Freq: Every day | ORAL | Status: DC
Start: 2013-12-19 — End: 2013-12-20
  Administered 2013-12-19 – 2013-12-20 (×2): 40 mg via ORAL
  Filled 2013-12-19 (×2): qty 1

## 2013-12-19 MED ORDER — MOVING RIGHT ALONG BOOK
Freq: Once | Status: AC
  Administered 2013-12-19: 11:00:00
  Filled 2013-12-19: qty 1

## 2013-12-19 MED ORDER — ATENOLOL 12.5 MG HALF TABLET
12.5000 mg | ORAL_TABLET | Freq: Two times a day (BID) | ORAL | Status: DC
  Administered 2013-12-19 – 2013-12-24 (×11): 12.5 mg via ORAL
  Filled 2013-12-19 (×13): qty 1

## 2013-12-19 MED ORDER — PANTOPRAZOLE SODIUM 40 MG PO TBEC
40.0000 mg | DELAYED_RELEASE_TABLET | Freq: Every day | ORAL | Status: DC
  Administered 2013-12-20 – 2013-12-24 (×5): 40 mg via ORAL
  Filled 2013-12-19 (×5): qty 1

## 2013-12-19 MED ORDER — TRIMETHOPRIM 100 MG PO TABS
100.0000 mg | ORAL_TABLET | Freq: Every day | ORAL | Status: DC
  Administered 2013-12-19 – 2013-12-24 (×6): 100 mg via ORAL
  Filled 2013-12-19 (×7): qty 1

## 2013-12-19 MED ORDER — SODIUM CHLORIDE 0.9 % IJ SOLN: 3.0000 mL | INTRAMUSCULAR | Status: DC | PRN

## 2013-12-19 MED ORDER — SODIUM CHLORIDE 0.9 % IV SOLN: 250.0000 mL | INTRAVENOUS | Status: DC | PRN

## 2013-12-19 MED ORDER — CETYLPYRIDINIUM CHLORIDE 0.05 % MT LIQD
7.0000 mL | Freq: Two times a day (BID) | OROMUCOSAL | Status: DC
  Administered 2013-12-19 – 2013-12-20 (×3): 7 mL via OROMUCOSAL

## 2013-12-19 MED ORDER — ENOXAPARIN SODIUM 30 MG/0.3ML ~~LOC~~ SOLN
30.0000 mg | SUBCUTANEOUS | Status: DC
  Administered 2013-12-20 – 2013-12-22 (×3): 30 mg via SUBCUTANEOUS
  Filled 2013-12-19 (×5): qty 0.3

## 2013-12-19 NOTE — Progress Notes (Addendum)
[address]       [place] [address]             [phone]        CARDIOTHORACIC SURGERY PROGRESS NOTE   R2 Days Post-Op Procedure(s) (LRB): CORONARY ARTERY BYPASS GRAFTING (CABG) (N/A) INTRAOPERATIVE TRANSESOPHAGEAL ECHOCARDIOGRAM (N/A)  Subjective: Looks good.  Denies pain, SOB.  Ate breakfast  Objective: Vital signs: BP Readings from Last 1 Encounters:  12/19/13 113/61   Pulse Readings from Last 1 Encounters:  12/19/13 76   Resp Readings from Last 1 Encounters:  12/19/13 21   Temp Readings from Last 1 Encounters:  12/19/13 98.2 F (36.8 C) Oral    Hemodynamics: PAP: (51)/(27) 51/27 mmHg  Physical Exam:  Rhythm:   sinus  Breath sounds: clear  Heart sounds:  RRR  Incisions:  Dressing dry, intact  Abdomen:  Soft, non-distended, non-tender  Extremities:  Warm, well-perfused    Intake/Output from previous day: 08/29 0701 - 08/30 0700 In: 992.7 [P.O.:720; I.V.:172.7; IV Piggyback:100] Out: 1225 [Urine:1105; Chest Tube:120] Intake/Output this shift: Total I/O In: -  Out: 15 [Urine:15]  Lab Results:  CBC: Recent Labs  12/18/13 1700 12/18/13 1718 12/19/13 0410  WBC 6.6  --  6.5  HGB 10.9* 11.2* 10.1*  HCT 34.3* 33.0* 31.1*  PLT 176  --  161    BMET:  Recent Labs  12/18/13 0400  12/18/13 1718 12/19/13 0410  NA 141  --  138 141  K 4.1  --  4.0 3.9  CL 107  --  107 105  CO2 24  --   --  25  GLUCOSE 142*  --  123* 118*  BUN 9  --  8 10  CREATININE 0.70  < > 0.60 0.64  CALCIUM 7.6*  --   --  8.0*  < > = values in this interval not displayed.   CBG (last 3)   Recent Labs  12/18/13 1555 12/18/13 2021 12/19/13 0728  GLUCAP 116* 140* 123*    ABG    Component Value Date/Time   PHART 7.358 12/17/2013 2254   PCO2ART 45.6* 12/17/2013 2254   PO2ART 87.0 12/17/2013 2254   HCO3 25.4* 12/17/2013 2254   TCO2 22 12/18/2013 1718   ACIDBASEDEF 1.0 12/17/2013 2055   O2SAT 96.0 12/17/2013 2254    CXR: PORTABLE CHEST - 1  VIEW  COMPARISON: 12/18/2013 and earlier.  FINDINGS:  Portable AP semi upright view at 0501 hrs. Left chest tubes removed.  Mediastinal tube removed. Right IJ Swan-Ganz catheter removed,  introducer sheath remains. Patient remains extubated. No enteric  tube.  Stable cardiac size and mediastinal contours. No pneumothorax or  pulmonary edema. Moderate bilateral pleural effusions. Bibasilar  atelectasis. Stable visualized osseous structures. Epicardial pacer  wires.  IMPRESSION:  1. Lines and tubes removed except for right IJ introducer sheath.  2. No pneumothorax or pulmonary edema. Moderate bilateral pleural  effusions.  Electronically Signed  By: Lars Pinks M.D.  On: 12/19/2013 07:20   Assessment/Plan: S/P Procedure(s) (LRB): CORONARY ARTERY BYPASS GRAFTING (CABG) (N/A) INTRAOPERATIVE TRANSESOPHAGEAL ECHOCARDIOGRAM (N/A)  Overall doing quite well POD2 Maintaining NSR w/ stable BP Expected post op acute blood loss anemia, stable  Expected post op volume excess, mild  Expected post op atelectasis, mild, L>R  Remote h/o stroke w/ right spastic hemiplegia and aphasia   Mobilize  Gentle diuresis  D/C central line and foley  PT consult  Lovenox for DVT prophylaxis  Kristin [name] H 12/19/2013 10:09 AM

## 2013-12-20 ENCOUNTER — Inpatient Hospital Stay (HOSPITAL_COMMUNITY): Payer: Medicare PPO

## 2013-12-20 ENCOUNTER — Encounter (HOSPITAL_COMMUNITY): Payer: Self-pay | Admitting: Surgery

## 2013-12-20 LAB — BASIC METABOLIC PANEL
Anion gap: 14 (ref 5–15)
BUN: 13 mg/dL (ref 6–23)
CO2: 23 mEq/L (ref 19–32)
Calcium: 8.6 mg/dL (ref 8.4–10.5)
Chloride: 105 mEq/L (ref 96–112)
Creatinine, Ser: 0.62 mg/dL (ref 0.50–1.10)
GFR calc Af Amer: >90 mL/min (ref >90)
GFR calc non Af Amer: 86 mL/min — ABNORMAL LOW (ref >90)
Glucose, Bld: 129 mg/dL — ABNORMAL HIGH (ref 70–99)
Potassium: 3.8 mEq/L (ref 3.7–5.3)
Sodium: 142 mEq/L (ref 137–147)

## 2013-12-20 LAB — CBC
HCT: 35.8 % — ABNORMAL LOW (ref 36.0–46.0)
Hemoglobin: 11.5 g/dL — ABNORMAL LOW (ref 12.0–15.0)
MCH: 28 pg (ref 26.0–34.0)
MCHC: 32.1 g/dL (ref 30.0–36.0)
MCV: 87.3 fL (ref 78.0–100.0)
Platelets: 178 10*3/uL (ref 150–400)
RBC: 4.1 MIL/uL (ref 3.87–5.11)
RDW: 15 % (ref 11.5–15.5)
WBC: 8.2 10*3/uL (ref 4.0–10.5)

## 2013-12-20 LAB — GLUCOSE, CAPILLARY
Glucose-Capillary: 117 mg/dL — ABNORMAL HIGH (ref 70–99)
Glucose-Capillary: 99 mg/dL (ref 70–99)

## 2013-12-20 MED ORDER — POTASSIUM CHLORIDE CRYS ER 20 MEQ PO TBCR
40.0000 meq | EXTENDED_RELEASE_TABLET | Freq: Once | ORAL | Status: AC
  Administered 2013-12-20: 40 meq via ORAL
  Filled 2013-12-20: qty 2

## 2013-12-20 MED FILL — Heparin Sodium (Porcine) Inj 1000 Unit/ML: INTRAMUSCULAR | Qty: 30 | Status: AC

## 2013-12-20 MED FILL — Sodium Chloride IV Soln 0.9%: INTRAVENOUS | Qty: 2000 | Status: AC

## 2013-12-20 MED FILL — Sodium Bicarbonate IV Soln 8.4%: INTRAVENOUS | Qty: 50 | Status: AC

## 2013-12-20 MED FILL — Lidocaine HCl IV Inj 20 MG/ML: INTRAVENOUS | Qty: 5 | Status: AC

## 2013-12-20 MED FILL — Mannitol IV Soln 20%: INTRAVENOUS | Qty: 500 | Status: AC

## 2013-12-20 MED FILL — Electrolyte-R (PH 7.4) Solution: INTRAVENOUS | Qty: 5000 | Status: AC

## 2013-12-20 MED FILL — Dexmedetomidine HCl IV Soln 200 MCG/2ML: INTRAVENOUS | Qty: 2 | Status: AC

## 2013-12-20 MED FILL — Potassium Chloride Inj 2 mEq/ML: INTRAVENOUS | Qty: 40 | Status: AC

## 2013-12-20 MED FILL — Magnesium Sulfate Inj 50%: INTRAMUSCULAR | Qty: 10 | Status: AC

## 2013-12-20 NOTE — Progress Notes (Signed)
Physical Therapy Treatment Patient Details Name: Kristin [name] MRN: [id number] DOB: 01-Mar-1941 Today's Date: 12/20/2013    History of Present Illness Pt adm with chest pain. PMH- CAD with stenting, Lt CVA with rt hemiparesis and expressive aphasia, HTN. Pt underwent CABG x 2 on 8/28.    PT Comments    Pt making good progress but bed mobility and transfers remain very difficult due to hemiplegia and sternal precautions.  Follow Up Recommendations  SNF (per rehab coordinator pt's insurance won't approve CIR.)     Equipment Recommendations  None recommended by PT    Recommendations for Other Services       Precautions / Restrictions Precautions Precautions: Fall;Sternal Required Braces or Orthoses: Other Brace/Splint Other Brace/Splint: Rt AFO Restrictions Other Position/Activity Restrictions: sternal precautions    Mobility  Bed Mobility Overal bed mobility: Needs Assistance Bed Mobility: Supine to Sit     Supine to sit: +2 for physical assistance;Mod assist     General bed mobility comments: Assist to bring trunk up and hips to EOB due to sternal precautions limiting the pt from pulling up with LUE.  Transfers Overall transfer level: Needs assistance Equipment used: 4-wheeled walker;Ambulation equipment used Charlaine Dalton) Transfers: Sit to/from [facility] Sit to Stand: +2 physical assistance;Mod assist;Max assist Stand pivot transfers: +2 physical assistance;Mod assist       General transfer comment: Verbal cues to follow sternal precuations. Assist to bring hips up. Used Stedy to transfer bed to bsc to chair. Pt able to achieve full upright standing. More assist needed from low surface.  Ambulation/Gait Ambulation/Gait assistance: Min assist;+2 safety/equipment Ambulation Distance (Feet): 100 Feet Assistive device: 4-wheeled walker Gait Pattern/deviations: Step-through pattern;Decreased step length - right;Decreased step length - left;Decreased  dorsiflexion - right Gait velocity: decr Gait velocity interpretation: Below normal speed for age/gender General Gait Details: Pt has to have  rt hand placed on handle of rollator. Left hand provides propulsion and steering of rollator. At times pt's rt foot kicks wheel of rollator.    Stairs            Wheelchair Mobility    Modified Rankin (Stroke Patients Only)       Balance   Sitting-balance support: No upper extremity supported;Feet supported Sitting balance-Leahy Scale: Good     Standing balance support: Single extremity supported Standing balance-Leahy Scale: Poor                      Cognition Arousal/Alertness: Awake/alert Behavior During Therapy: WFL for tasks assessed/performed Overall Cognitive Status: Difficult to assess                      Exercises      General Comments        Pertinent Vitals/Pain Pain Assessment: No/denies pain    Home Living                      Prior Function            PT Goals (current goals can now be found in the care plan section) Progress towards PT goals: Progressing toward goals    Frequency  Min 3X/week    PT Plan Discharge plan needs to be updated    Co-evaluation             End of Session Equipment Utilized During Treatment: Gait belt Activity Tolerance: Patient tolerated treatment well Patient left: in chair;with call bell/phone within reach;with family/visitor present  Time: 9470-9628 PT Time Calculation (min): 38 min  Charges:  $Gait Training: 38-52 mins                    G Codes:      Nesa Distel 2013-12-27, 1:42 PM  [facility] PT [phone]

## 2013-12-20 NOTE — Progress Notes (Signed)
Patient was screened by Gerlean Ren for appropriateness for an Inpatient Acute Rehab consult.  Pt. 's insurer is [employer] (not [employer])  Unfortunately, it is highly unlikely that admission to IP Rehab would be approved given her current diagnosis. We are suggesting consideration of another venue of post acute rehab.     [place] Admissions Coordinator Cell [phone] Office [phone]

## 2013-12-20 NOTE — Progress Notes (Signed)
CARDIAC REHAB PHASE I   PRE:  Rate/Rhythm: 73 SR  BP:  Supine: 100/60  Sitting:   Standing:    SaO2: 94%RA  MODE:  Ambulation: 80  ft   POST:  Rate/Rhythm: 80  BP:  Supine:   Sitting:   Standing:  To BSC   SaO2: 96%RA 1500-1542 Pt walked 80 ft with gait belt use, rollator and asst x 2 needing asst to keep right hand on rollator. Put right leg brace on prior to walk. To BSC after walk and then to recliner. Took leg brace off at pt's request . Husband in room. Put right arm up on pillow.  Graylon Good, RN BSN  12/20/2013 3:39 PM

## 2013-12-21 MED ORDER — POTASSIUM CHLORIDE CRYS ER 20 MEQ PO TBCR
20.0000 meq | EXTENDED_RELEASE_TABLET | Freq: Once | ORAL | Status: AC
  Administered 2013-12-21: 20 meq via ORAL
  Filled 2013-12-21: qty 1

## 2013-12-21 NOTE — Progress Notes (Signed)
Physical Therapy Treatment Patient Details Name: Kristin [name] MRN: [id number] DOB: 03-13-1941 Today's Date: 12/21/2013    History of Present Illness Pt adm with chest pain. PMH- CAD with stenting, Lt CVA with rt hemiparesis and expressive aphasia, HTN. Pt underwent CABG x 2 on 8/28.    PT Comments    Pt making steady progress but not able to mobilize without assistance. Husband uses rollator himself and won't be able to assist pt.  Follow Up Recommendations  SNF     Equipment Recommendations  None recommended by PT    Recommendations for Other Services       Precautions / Restrictions Precautions Precautions: Fall;Sternal Required Braces or Orthoses: Other Brace/Splint Other Brace/Splint: Rt AFO Restrictions Other Position/Activity Restrictions: sternal precautions    Mobility  Bed Mobility Overal bed mobility: Needs Assistance Bed Mobility: Supine to Sit     Supine to sit: Mod assist     General bed mobility comments: Assist to bring trunk up. Verbal/tactile cues to decr use of LUE in pulling/pushing.  Transfers Overall transfer level: Needs assistance Equipment used: 4-wheeled walker Transfers: Sit to/from Stand Sit to Stand: Min assist         General transfer comment: Verbal/tactile cues to decr use of LUE to follow sternal precautions. Assist to bring hips up.  Ambulation/Gait Ambulation/Gait assistance: Min assist Ambulation Distance (Feet): 175 Feet Assistive device: 4-wheeled walker Gait Pattern/deviations: Step-through pattern;Decreased step length - right;Decreased step length - left;Decreased dorsiflexion - right Gait velocity: decr Gait velocity interpretation: Below normal speed for age/gender General Gait Details: Pt has to have  rt hand placed on handle of rollator. Left hand provides propulsion and steering of rollator. At times pt's rt foot kicks wheel of rollator. Assist to maintain RUE on rollator.   Stairs            Wheelchair  Mobility    Modified Rankin (Stroke Patients Only)       Balance Overall balance assessment: Needs assistance Sitting-balance support: No upper extremity supported Sitting balance-Leahy Scale: Good     Standing balance support: Single extremity supported;During functional activity Standing balance-Leahy Scale: Poor Standing balance comment: Needs support of LUE to stand.                    Cognition Arousal/Alertness: Awake/alert Behavior During Therapy: WFL for tasks assessed/performed Overall Cognitive Status: Within Functional Limits for tasks assessed                      Exercises      General Comments        Pertinent Vitals/Pain Pain Assessment: Faces Faces Pain Scale: Hurts a little bit Pain Location: chest incision Pain Descriptors / Indicators: Sore (using yes/no questions to answer) Pain Intervention(s): Limited activity within patient's tolerance    Home Living                      Prior Function            PT Goals (current goals can now be found in the care plan section) Progress towards PT goals: Progressing toward goals    Frequency  Min 3X/week    PT Plan Current plan remains appropriate    Co-evaluation             End of Session   Activity Tolerance: Patient limited by fatigue Patient left: in bed;with call bell/phone within reach     Time: 7829-5621 PT Time  Calculation (min): 28 min  Charges:  $Gait Training: 23-37 mins                    G Codes:      Kristin [name] 12-24-2013, 5:32 PM  [facility] PT [phone]

## 2013-12-21 NOTE — Progress Notes (Addendum)
      [address]       [place] [address]             [phone]        4 Days Post-Op Procedure(s) (LRB): CORONARY ARTERY BYPASS GRAFTING (CABG) (N/A) INTRAOPERATIVE TRANSESOPHAGEAL ECHOCARDIOGRAM (N/A)  Subjective: Patient about to eat breakfast.  Objective: Vital signs in last 24 hours: Temp:  [98.7 F (37.1 C)-99.2 F (37.3 C)] 99 F (37.2 C) (09/01 0432) Pulse Rate:  [66-75] 75 (09/01 0432) Cardiac Rhythm:  [-] Normal sinus rhythm (08/31 2030) Resp:  [18-22] 18 (09/01 0432) BP: (104-119)/(67-76) 119/67 mmHg (09/01 0432) SpO2:  [94 %-96 %] 96 % (09/01 0432) Weight:  [175 lb 0.7 oz (79.4 kg)] 175 lb 0.7 oz (79.4 kg) (09/01 0427)  Pre op weight 82 kg Current Weight  12/21/13 175 lb 0.7 oz (79.4 kg)      Intake/Output from previous day: 08/31 0701 - 09/01 0700 In: 120 [P.O.:120] Out: -    Physical Exam:  Cardiovascular: RRR, no murmurs, gallops, or rubs. Pulmonary: Clear to auscultation bilaterally; no rales, wheezes, or rhonchi. Abdomen: Soft, non tender, bowel sounds present. Extremities: Trace lower extremity edema. Wounds: Dressing is clean and dry. Trace ecchymosis around right evh wound  Lab Results: CBC: Recent Labs  12/19/13 0410 12/20/13 0309  WBC 6.5 8.2  HGB 10.1* 11.5*  HCT 31.1* 35.8*  PLT 161 178   BMET:  Recent Labs  12/19/13 0410 12/20/13 0309  NA 141 142  K 3.9 3.8  CL 105 105  CO2 25 23  GLUCOSE 118* 129*  BUN 10 13  CREATININE 0.64 0.62  CALCIUM 8.0* 8.6    PT/INR:  Lab Results  Component Value Date   INR 1.40 12/17/2013   INR 0.97 12/13/2013   ABG:  INR: Will add last result for INR, ABG once components are confirmed Will add last 4 CBG results once components are confirmed  Assessment/Plan:  1. CV - SR. On Atenolol 12.5 bid. 2.  Pulmonary - Encourage incentive spirometer 3.  Acute blood loss anemia - H and H stable at 11.5 and 35.8 4. Neuro-history left CVA with right spastic hemiparesis  and  expressive aphasia 5. Supplement potassium 6. Was on a baby ecasa pre op and Plavix prior to surgery. Will discuss with Dr. Cyndia Bent. 7. Appears not candidate for CIR. Will need SNF  Kristin [name] MPA-C 12/21/2013,7:40 AM   Chart reviewed, patient examined, agree with above. She is doing fairly well considering her preop disability. She will benefit from a stay at [facility] for rehab.

## 2013-12-21 NOTE — Discharge Instructions (Signed)
Activity: 1.May walk up steps °               2.No lifting more than ten pounds for four weeks.  °               3.No driving for four weeks. °               4.Stop any activity that causes chest pain, shortness of breath, dizziness, sweating or excessive weakness. °               5.Avoid straining. °               6.Continue with your breathing exercises daily. ° °Diet: Low fat, Low salt diet ° °Wound Care: May shower.  Clean wounds with mild soap and water daily. Contact the office at [phone] if any problems arise. ° ° °Coronary Artery Bypass Grafting, Care After °Refer to this sheet in the next few weeks. These instructions provide you with information on caring for yourself after your procedure. Your health care provider may also give you more specific instructions. Your treatment has been planned according to current medical practices, but problems sometimes occur. Call your health care provider if you have any problems or questions after your procedure. °WHAT TO EXPECT AFTER THE PROCEDURE °Recovery from surgery will be different for everyone. Some people feel well after 3 or 4 weeks, while for others it takes longer. After your procedure, it is typical to have the following: °· Nausea and a lack of appetite.   °· Constipation. °· Weakness and fatigue.   °· Depression or irritability.   °· Pain or discomfort at your incision site. °HOME CARE INSTRUCTIONS °· Take medicines only as directed by your health care provider. Do not stop taking medicines or start any new medicines without first checking with your health care provider. °· Take your pulse as directed by your health care provider. °· Perform deep breathing as directed by your health care provider. If you were given a device called an incentive spirometer, use it to practice deep breathing several times a day. Support your chest with a pillow or your arms when you take deep breaths or cough. °· Keep incision areas clean, dry, and protected. Remove or  change any bandages (dressings) only as directed by your health care provider. You may have skin adhesive strips over the incision areas. Do not take the strips off. They will fall off on their own. °· Check incision areas daily for any swelling, redness, or drainage. °· If incisions were made in your legs, do the following: °¨ Avoid crossing your legs.   °¨ Avoid sitting for long periods of time. Change positions every 30 minutes.   °¨ Elevate your legs when you are sitting. °· Wear compression stockings as directed by your health care provider. These stockings help keep blood clots from forming in your legs. °· Take showers once your health care provider approves. Until then, only take sponge baths. Pat incisions dry. Do not rub incisions with a washcloth or towel. Do not take baths, swim, or use a hot tub until your health care provider approves. °· Eat foods that are high in fiber, such as raw fruits and vegetables, whole grains, beans, and nuts. Meats should be lean cut. Avoid canned, processed, and fried foods. °· Drink enough fluid to keep your urine clear or pale yellow. °· Weigh yourself every day. This helps identify if you are retaining fluid that may make your heart and lungs work harder. °·   Rest and limit activity as directed by your health care provider. You may be instructed to: °¨ Stop any activity at once if you have chest pain, shortness of breath, irregular heartbeats, or dizziness. Get help right away if you have any of these symptoms. °¨ Move around frequently for short periods or take short walks as directed by your health care provider. Increase your activities gradually. You may need physical therapy or cardiac rehabilitation to help strengthen your muscles and build your endurance. °¨ Avoid lifting, pushing, or pulling anything heavier than 10 lb (4.5 kg) for at least 6 weeks after surgery. °· Do not drive until your health care provider approves.  °· Ask your health care provider when you  may return to work. °· Ask your health care provider when you may resume sexual activity. °· Keep all follow-up visits as directed by your health care provider. This is important. °SEEK MEDICAL CARE IF: °· You have swelling, redness, increasing pain, or drainage at the site of an incision. °· You have a fever. °· You have swelling in your ankles or legs. °· You have pain in your legs.   °· You gain 2 or more pounds (0.9 kg) a day. °· You are nauseous or vomit. °· You have diarrhea.  °SEEK IMMEDIATE MEDICAL CARE IF: °· You have chest pain that goes to your jaw or arms. °· You have shortness of breath.   °· You have a fast or irregular heartbeat.   °· You notice a "clicking" in your breastbone (sternum) when you move.   °· You have numbness or weakness in your arms or legs. °· You feel dizzy or light-headed.   °MAKE SURE YOU: °· Understand these instructions. °· Will watch your condition. °· Will get help right away if you are not doing well or get worse. °Document Released: 10/26/2004 Document Revised: 08/23/2013 Document Reviewed: 09/15/2012 °[employer]® Patient Information ©2015 [employer]. This information is not intended to replace advice given to you by your health care provider. Make sure you discuss any questions you have with your health care provider. ° ° ° °

## 2013-12-21 NOTE — Discharge Summary (Signed)
Physician Discharge Summary       [address]       [place], [address]             [phone]    Patient ID: Girtha Kilgore MRN: [id number] DOB/AGE: Nov 01, 1940 73 y.o.  Admit date: 12/09/2013 Discharge date: 12/24/2013  Admission Diagnoses: 1. History of CAD (S/p PCI with stent to RCA 10') 2. History of hypertension 3. History of CVA 35' (right spastic hemiplegia and expressive aphasia) 4. History of hyperlipidemia 5. History of GERD 6. History of incontinence  Discharge Diagnoses:  1. History of CAD (S/p PCI with stent to RCA 10') 2. History of hypertension 3. History of CVA 26' (right spastic hemiplegia and expressive aphasia) 4. History of hyperlipidemia 5. History of GERD 6. History of incontinence 7. Mild ABL anemia  Procedure (s):  Cardiac catheterization done by Dr. Irish Lack on 12/13/2013: HEMODYNAMICS: Aortic pressure was 128/69; LV pressure was 124/1; LVEDP 9. There was no gradient between the left ventricle and aorta.  ANGIOGRAPHIC DATA: The left main coronary artery is widely patent proximally. There is minimal distal disease.  The left anterior descending artery is a large vessel proximally. There is an ostial 95% stenosis. Remainder of the LAD has moderate disease. The vessel appears somewhat underfilled. There are left to right collaterals noted as well. There is a long first diagonal. It appears somewhat underfilled. There is a moderate, proximal stenosis in the diagonal.  The left circumflex artery is a large vessel. There is mild disease proximally. There is a first obtuse marginal which is medium-sized and patent. The second obtuse marginal is very large and branches across the lateral wall. This vessel has only minimal disease. The remainder of the circumflex is large and widely patent.  The right coronary artery is a large dominant vessel. There is a stent in the proximal vessel which is widely patent. After a large RV marginal branch, there is a  long segment of severe disease in the mid to distal RCA, up to 95%. There is a stent in the distal RCA which appears patent. After the distal stent, a large posterior descending artery is noted. There is bidirectional flow in the posterior descending artery, likely from the left sided collaterals. The posterior lateral artery appears occluded. There are some collaterals from the RV marginal branch which appeared to go towards the LAD system.  LEFT VENTRICULOGRAM: Left ventricular angiogram was not done. LVEDP was 9 mmHg  1. Median Sternotomy 2. Extracorporeal circulation 3. Coronary artery bypass grafting x 2  Left internal mammary graft to the LAD  SVG to PDA 4. Endoscopic vein harvest from the right leg  History of Presenting Illness: The patient is a 73 year old woman with a history of hypertension, CAD s/p PCI and stenting of the RCA in 2010 in [place], carotid disease s/p stroke in 2001 with persistent expressive aphasia and right hemiparesis. History is somewhat difficult due to aphasia but her sister and husband are here to fill in the gaps. They report a history of heartburn symptoms for a while that she thought was due to reflux. Then on the morning of 8/20 she developed severe substernal and epigastric pain. She has not had cardiology follow up since moving to [place] 2 years ago. On presentation, her troponin was negative and ECG showed T wave inversions in V2-V3 with no old ECG. She had a Lexiscan Myoview that showed stress induced ischemia of the apex extending into the septum and inferior walls  with an EF of 60%. 2 D echo showed no significant valvular disease and an LVEF 65%.Cardiac catheterization shows a 95% ostial LAD stenosis. The LCX is large with no significant stenosis. The RCA is a large dominant vessel with a patent proximal stent followed by a long segment 95% mid to distal stenosis. The stent in the distal RCA beyond this is patent with a large PDA with bidirectional flow. The PL is  occluded. According to family, she is ambulatory and uses a rolling walker for balance. Dr. Cyndia Bent was consulted for the consideration of coronary artery bypass grafting surgery. Potential risks, benefits, and complications were discussed with the patient and she agreed to proceed with surgery. Pre operative duplex US showed no significant carotid artery stenosis bilaterally.She underwent a CABGx2 on 12/17/2013.   Brief Hospital Course:  The patient was extubated later the evening of surgery without difficulty. She remained afebrile and hemodynamically stable. She was weaned off of Neo synephrine drip. Gordy Councilman, a line, and chest tubes were removed early in the post operative course. Atenolol was started. Foley remained for a few days as she was still in ICU and history of CVA with right spastic hemiparesis. She was volume over loaded and diuresed. She was weaned off the insulin drip. The patient's HGA1C pre op was 5.4. She did have ABL anemia. Her last H and H was up to 11.5 and 35.8. She did not require a post op transfusion. The patient was felt surgically stable for transfer from the ICU to [facility] for further convalescence on 12/20/2012. She continues to progress with PT. She was ambulating on room air. She has been tolerating a diet and has had a bowel movement. Epicardial pacing wires have already been removed. The SNF is asked to remove the chest tube sutures on 12/30/2013. In addition, the SNF is asked to please obtain vital signs at least one time daily, please weigh the patient daily. If she continues to gain weight or develops lower extremity edema, contact the office at [phone]. Finally, please ambulate patient at least three times daily and please use sternal precautions. She is surgically stable for discharge today.  Latest Vital Signs: Blood pressure 112/80, pulse 79, temperature 98.1 F (36.7 C), temperature source Oral, resp. rate 18, height 5' 6.93" (1.7 m), weight 174 lb 3.2 oz (79.017  kg), SpO2 95.00%.  Physical Exam: Cardiovascular: RRR, no murmurs, gallops, or rubs.  Pulmonary: Clear to auscultation bilaterally; no rales, wheezes, or rhonchi.  Abdomen: Soft, non tender, bowel sounds present.  Extremities: Trace lower extremity edema.  Wounds: Dressing is clean and dry. Trace ecchymosis around right evh wound   Discharge Condition:Stable  Recent laboratory studies:  Lab Results  Component Value Date   WBC 8.2 12/20/2013   HGB 11.5* 12/20/2013   HCT 35.8* 12/20/2013   MCV 87.3 12/20/2013   PLT 178 12/20/2013   Lab Results  Component Value Date   NA 142 12/20/2013   K 3.8 12/20/2013   CL 105 12/20/2013   CO2 23 12/20/2013   CREATININE 0.62 12/20/2013   GLUCOSE 129* 12/20/2013      Diagnostic Studies: Dg Chest 2 View  12/20/2013   CLINICAL DATA:  Status post CABG.  Atelectasis.  EXAM: CHEST  2 VIEW  COMPARISON:  Multiple recent previous exams.  FINDINGS: 0604 hrs. Right IJ sheath has been removed in the interval. Low lung volumes with persistent but slightly improved bibasilar atelectasis. Small bilateral pleural effusions persist. Cardiopericardial silhouette is stable in size.  No evidence for pneumothorax. Telemetry leads overlie the chest.  IMPRESSION: Interval improvement in basilar aeration with persistent atelectasis and small effusions.   Electronically Signed   By: Misty Stanley M.D.   On: 12/20/2013 07:18    Nm Myocar Multi W/spect W/wall Motion / Ef  12/10/2013   CLINICAL DATA:  Chest pain  EXAM: MYOCARDIAL IMAGING WITH SPECT (REST AND PHARMACOLOGIC-STRESS)  GATED LEFT VENTRICULAR WALL MOTION STUDY  LEFT VENTRICULAR EJECTION FRACTION  TECHNIQUE: Standard myocardial SPECT imaging was performed after resting intravenous injection of 10 mCi Tc-26m sestamibi. Subsequently, intravenous infusion of Lexiscan was performed under the supervision of the Cardiology staff. At peak effect of the drug, 30 mCi Tc-7m sestamibi was injected intravenously and standard myocardial  SPECT imaging was performed. Quantitative gated imaging was also performed to evaluate left ventricular wall motion, and estimate left ventricular ejection fraction.  COMPARISON:  None.  FINDINGS: Perfusion: There is a large reversible perfusion defect at the apex extending into the septum and inferior walls. At the lateral apex, there is a small fixed defect.  Wall Motion: Septal hypokinesis.  Left Ventricular Ejection Fraction: 60 %  End diastolic volume 88 ml  End systolic volume 36 ml  IMPRESSION: 1. The study is positive for stress-induced ischemia in the apex extending into the septum and inferior walls.  2. Septal hypokinesis.  3. Left ventricular ejection fraction 60%  4. High-risk stress test findings*.  *2012 Appropriate Use Criteria for Coronary Revascularization Focused Update: J Am Coll Cardiol. 0962;83(6):629-476. http://content.airportbarriers.com.aspx?articleid=1201161   Electronically Signed   By: Maryclare Bean M.D.   On: 12/10/2013 12:45   Discharge Medications:   Medication List    STOP taking these medications       isosorbide mononitrate 30 MG 24 hr tablet  Commonly known as:  IMDUR     RANEXA 500 MG 12 hr tablet  Generic drug:  ranolazine      TAKE these medications       aspirin 81 MG tablet  Take 81 mg by mouth daily.     atenolol 25 MG tablet  Commonly known as:  TENORMIN  Take 12.5 mg by mouth 2 (two) times daily.     atorvastatin 40 MG tablet  Commonly known as:  LIPITOR  Take 80 mg by mouth daily at 6 PM.     clopidogrel 75 MG tablet  Commonly known as:  PLAVIX  Take 75 mg by mouth daily.     diclofenac sodium 1 % Gel  Commonly known as:  VOLTAREN  Apply 4 g topically 4 (four) times daily as needed (pain).     oxybutynin 5 MG tablet  Commonly known as:  DITROPAN  Take 5 mg by mouth daily.     pregabalin 150 MG capsule  Commonly known as:  LYRICA  Take 150 mg by mouth 2 (two) times daily.     PRILOSEC 20 MG capsule  Generic drug:  omeprazole    Take 20 mg by mouth daily.     sertraline 50 MG tablet  Commonly known as:  ZOLOFT  Take 75 mg by mouth every evening.     trimethoprim 100 MG tablet  Commonly known as:  TRIMPEX  Take 100 mg by mouth daily.       The patient has been discharged on:  1.Beta Blocker:  Yes [ x  ]  No   [   ]                              If No, reason:  2.Ace Inhibitor/ARB: Yes [   ]                                     No  [ x   ]                                     If No, reason:Preserved LVEF, labile blood pressure  3.Statin:   Yes [ x  ]                  No  [   ]                  If No, reason:  4.Ecasa:  Yes  [  x ]                  No   [   ]                  If No, reason: SNF: 1. Please obtain vital signs at least one time daily 2.Please weigh the patient daily. If he or she continues to gain weight or develops lower extremity edema, contact the office at [phone]. 3. Ambulate patient at least three times daily and please use sternal precautions.  Follow Up Appointments: Follow-up Information   Follow up with Richardson Dopp, PA-C On 01/10/2014. (Appointment time is at 2:20 pm)    Specialty:  Physician Assistant   Contact information:   [address] [place] [address] [phone]       Follow up with Gaye Pollack, MD On 01/26/2014. (PA/LAt CXR to be taken (at [facility] which is in the same building as Dr. Vivi Martens office)on 01/26/2014 at 10:00 am ;Appointment time with Dr. Cyndia Bent is at 11:00 am)    Specialty:  Cardiothoracic Surgery   Contact information:   [place] [address] [phone]       Follow up with SNF. (Please remove chest tube sutures on 12/30/2013.)       Signed: ZIMMERMAN,[name] 12/24/2013, 7:58 AM

## 2013-12-21 NOTE — Progress Notes (Signed)
CARDIAC REHAB PHASE I   PRE:  Rate/Rhythm: 80 SR    BP: sitting 100/60    SaO2: 92 RA  MODE:  Ambulation: 150 ft   POST:  Rate/Rhythm: 97 SR    BP: sitting 94/60     SaO2: 94 RA  Assist x2 to get to EOB. Once to EOB, pt able to stand with min assist from gait belt. Assist x2 ambulating for supervision, used gait belt, rollator. Ambulation fairly close to baseline, some assistance needed manipulating rollator around obstacles. Able to stand from [facility] almost independently after walking. To recliner, feet elevated. Thankful for walk. No major c/o. [phone]   Kristin [name] [place] CES, ACSM 12/21/2013 9:07 AM

## 2013-12-22 MED ORDER — ENOXAPARIN SODIUM 30 MG/0.3ML ~~LOC~~ SOLN
30.0000 mg | SUBCUTANEOUS | Status: DC
  Filled 2013-12-22: qty 0.3

## 2013-12-22 MED ORDER — ASPIRIN 81 MG PO CHEW
81.0000 mg | CHEWABLE_TABLET | Freq: Every day | ORAL | Status: DC
  Administered 2013-12-22 – 2013-12-24 (×3): 81 mg via ORAL
  Filled 2013-12-22 (×4): qty 1

## 2013-12-22 MED ORDER — ENOXAPARIN SODIUM 40 MG/0.4ML ~~LOC~~ SOLN
40.0000 mg | SUBCUTANEOUS | Status: DC
  Administered 2013-12-22 – 2013-12-23 (×2): 40 mg via SUBCUTANEOUS
  Filled 2013-12-22 (×3): qty 0.4

## 2013-12-22 MED ORDER — CLOPIDOGREL BISULFATE 75 MG PO TABS
75.0000 mg | ORAL_TABLET | Freq: Every day | ORAL | Status: DC
Start: 2013-12-22 — End: 2013-12-24
  Administered 2013-12-22 – 2013-12-24 (×3): 75 mg via ORAL
  Filled 2013-12-22 (×4): qty 1

## 2013-12-22 NOTE — Progress Notes (Signed)
EPW removed per order. Protocol followed, ends intact, no bleeding noted. Steri strips + benzoin applied. Patient remains in NSR. Frequent vitas set up. Bedrest complete at 1015. Roxan Hockey, RN

## 2013-12-22 NOTE — Progress Notes (Addendum)
      [address]       [place] [address]             [phone]        5 Days Post-Op Procedure(s) (LRB): CORONARY ARTERY BYPASS GRAFTING (CABG) (N/A) INTRAOPERATIVE TRANSESOPHAGEAL ECHOCARDIOGRAM (N/A)  Subjective: Patient about to eat breakfast.  Objective: Vital signs in last 24 hours: Temp:  [98.9 F (37.2 C)-99.6 F (37.6 C)] 99.6 F (37.6 C) (09/02 0504) Pulse Rate:  [75] 75 (09/02 0504) Cardiac Rhythm:  [-] Normal sinus rhythm (09/01 2030) Resp:  [18] 18 (09/02 0504) BP: (118-123)/(71-77) 118/71 mmHg (09/02 0504) SpO2:  [93 %-97 %] 93 % (09/02 0504) Weight:  [176 lb 8 oz (80.06 kg)] 176 lb 8 oz (80.06 kg) (09/02 0504)  Pre op weight 82 kg Current Weight  12/22/13 176 lb 8 oz (80.06 kg)        Physical Exam:  Cardiovascular: RRR, no murmurs, gallops, or rubs. Pulmonary: Clear to auscultation bilaterally; no rales, wheezes, or rhonchi. Abdomen: Soft, non tender, bowel sounds present. Extremities: Trace lower extremity edema. Wounds: Dressing is clean and dry. Trace ecchymosis around right evh wound. Small tape tear to left of sternal incision.  Lab Results: CBC:  Recent Labs  12/20/13 0309  WBC 8.2  HGB 11.5*  HCT 35.8*  PLT 178   BMET:   Recent Labs  12/20/13 0309  NA 142  K 3.8  CL 105  CO2 23  GLUCOSE 129*  BUN 13  CREATININE 0.62  CALCIUM 8.6    PT/INR:  Lab Results  Component Value Date   INR 1.40 12/17/2013   INR 0.97 12/13/2013   ABG:  INR: Will add last result for INR, ABG once components are confirmed Will add last 4 CBG results once components are confirmed  Assessment/Plan:  1. CV - SR. On Atenolol 12.5 bid. 2.  Pulmonary - Encourage incentive spirometer 3.  Acute blood loss anemia - H and H stable at 11.5 and 35.8 4. Neuro-history left CVA with right spastic hemiparesis and  expressive aphasia 4. Was on a baby ecasa pre op and Plavix prior to surgery. Pharmacy requesting increase of Lovenox from  30 to 40 every 24 hours. Will discuss with Dr. Cyndia Bent about this and if need to resume asa and plavix. 5. Appears not candidate for CIR. Will need SNF 6. Remove EPW  ZIMMERMAN,[name] 12/22/2013,7:44 AM   Chart reviewed, patient examined, agree with above. She is doing well overall. Will resume baby ASA and Plavix that she was on for prior stroke I assume. Await SNF placement.

## 2013-12-22 NOTE — Clinical Social Work Note (Signed)
Clinical Social Worker continuing to follow patient and family for support and discharge planning needs.  CSW provided patient daughter with bed offers yesterday afternoon.  Patient daughter states that they have chosen [facility] for placement.  CSW contacted facility who will initiate [employer] authorization and update once authorization is received.  CSW remains available for support and to facilitate patient discharge needs once medically ready.  Barbette Or, [place]

## 2013-12-22 NOTE — Progress Notes (Signed)
CARDIAC REHAB PHASE I   PRE:  Rate/Rhythm: 76 SR  BP:  Supine:   Sitting: 114/39  Standing:    SaO2: 97%RA  MODE:  Ambulation: 170 ft   POST:  Rate/Rhythm: 81  BP:  Supine:   Sitting: 131/77  Standing:    SaO2: 100%RA 1124-1200 Pt walked 170 ft on RA with rollator,gait belt use and asst x 2. Held to right hand on rollator when pt walked. To recliner after walk. Took several standing rest breaks but did not need to sit. Cut up lunch for pt and set up for pt to eat. Right foot has tendency to hit rollator as pt walks.   Graylon Good, RN BSN  12/22/2013 11:53 AM

## 2013-12-23 NOTE — Progress Notes (Addendum)
      [address]       [place] [address]             [phone]        6 Days Post-Op Procedure(s) (LRB): CORONARY ARTERY BYPASS GRAFTING (CABG) (N/A) INTRAOPERATIVE TRANSESOPHAGEAL ECHOCARDIOGRAM (N/A)  Subjective: Patient eating breakfast.  Objective: Vital signs in last 24 hours: Temp:  [97.3 F (36.3 C)-98.7 F (37.1 C)] 98.7 F (37.1 C) (09/03 0336) Pulse Rate:  [66-74] 66 (09/03 0336) Cardiac Rhythm:  [-] Normal sinus rhythm (09/02 2045) Resp:  [18] 18 (09/03 0336) BP: (114-144)/(64-83) 122/76 mmHg (09/03 0336) SpO2:  [91 %-96 %] 96 % (09/03 0336) Weight:  [174 lb 3.2 oz (79.017 kg)] 174 lb 3.2 oz (79.017 kg) (09/03 0336)  Pre op weight 82 kg Current Weight  12/23/13 174 lb 3.2 oz (79.017 kg)     09/02 0701 - 09/03 0700 In: 240 [P.O.:240] Out: -   Physical Exam:  Cardiovascular: RRR, no murmurs, gallops, or rubs. Pulmonary: Clear to auscultation bilaterally; no rales, wheezes, or rhonchi. Abdomen: Soft, non tender, bowel sounds present. Extremities: Trace lower extremity edema. Wounds: Dressing is clean and dry. Trace ecchymosis around right evh wound. Small tape tear to left of sternal incision.  Lab Results: CBC: No results found for this basename: WBC, HGB, HCT, PLT,  in the last 72 hours BMET:  No results found for this basename: NA, K, CL, CO2, GLUCOSE, BUN, CREATININE, CALCIUM,  in the last 72 hours  PT/INR:  Lab Results  Component Value Date   INR 1.40 12/17/2013   INR 0.97 12/13/2013   ABG:  INR: Will add last result for INR, ABG once components are confirmed Will add last 4 CBG results once components are confirmed  Assessment/Plan:  1. CV - SR in he 70's. On Atenolol 12.5 bid. 2.  Pulmonary - Encourage incentive spirometer 3.  Acute blood loss anemia - H and H stable at 11.5 and 35.8 4. Neuro-history left CVA with right spastic hemiparesis and  expressive aphasia. Ecasa and Plavix resumed as taken prior to  surgery. 5. Appears not candidate for CIR. Will need SNF and bed may be available today.   ZIMMERMAN,[name] 12/23/2013,7:39 AM   Chart reviewed, patient examined, agree with above. FL-2 signed

## 2013-12-23 NOTE — Progress Notes (Signed)
Physical Therapy Treatment Patient Details Name: Kristin [name] MRN: [id number] DOB: 10/12/1940 Today's Date: 01-13-2014    History of Present Illness Pt adm with chest pain. PMH- CAD with stenting, Lt CVA with rt hemiparesis and expressive aphasia, HTN. Pt underwent CABG x 2 on 8/28.    PT Comments    Pt making steady progress. Difficulty following sternal precautions.  Follow Up Recommendations  SNF     Equipment Recommendations  None recommended by PT    Recommendations for Other Services       Precautions / Restrictions Precautions Precautions: Fall;Sternal Required Braces or Orthoses: Other Brace/Splint Other Brace/Splint: Rt AFO Restrictions Other Position/Activity Restrictions: sternal precautions    Mobility  Bed Mobility                  Transfers Overall transfer level: Needs assistance Equipment used: 4-wheeled walker Transfers: Sit to/from Stand Sit to Stand: Mod assist         General transfer comment: Verbal/tactile cues to decr use of LUE to follow sternal precautions. Pt reverts to using left upper extremity to push up from chair despite verbal/tactile cues. Assist to bring hips up.  Ambulation/Gait Ambulation/Gait assistance: Min assist Ambulation Distance (Feet): 175 Feet Assistive device: 4-wheeled walker Gait Pattern/deviations: Step-through pattern;Decreased step length - right;Decreased step length - left;Decreased dorsiflexion - right Gait velocity: decr Gait velocity interpretation: Below normal speed for age/gender General Gait Details: Pt has to have  rt hand placed on handle of rollator. Used ace wrap to hold left hand onto handle of rollator. Left hand provides propulsion and steering of rollator. At times pt's rt foot kicks wheel of rollator.    Stairs            Wheelchair Mobility    Modified Rankin (Stroke Patients Only)       Balance   Sitting-balance support: No upper extremity supported;Feet supported Sitting  balance-Leahy Scale: Good     Standing balance support: During functional activity Standing balance-Leahy Scale: Fair Standing balance comment: Able to maintain static standing.                    Cognition Arousal/Alertness: Awake/alert Behavior During Therapy: WFL for tasks assessed/performed Overall Cognitive Status: Within Functional Limits for tasks assessed                      Exercises      General Comments        Pertinent Vitals/Pain      Home Living                      Prior Function            PT Goals (current goals can now be found in the care plan section) Progress towards PT goals: Progressing toward goals    Frequency  Min 2X/week    PT Plan Current plan remains appropriate;Frequency needs to be updated    Co-evaluation             End of Session Equipment Utilized During Treatment: Gait belt Activity Tolerance: Patient tolerated treatment well Patient left: in chair;with call bell/phone within reach;with family/visitor present     Time: 1112-1150 PT Time Calculation (min): 38 min  Charges:  $Gait Training: 38-52 mins                    G Codes:      Kristin [name] Jan 13, 2014, 2:24 PM  [employer] PT [phone]

## 2013-12-23 NOTE — Clinical Social Work Placement (Addendum)
Clinical Social Work Department CLINICAL SOCIAL WORK PLACEMENT NOTE 12/21/2013  Patient:  Kristin [name], Kristin [name]  Account Number:  [id number] Admit date:  12/09/2013  Clinical Social Worker:  Barbette Or, LCSW  Date/time:  12/21/2013 04:00 PM  Clinical Social Work is seeking post-discharge placement for this patient at the following level of care:   [place]   (*CSW will update this form in Epic as items are completed)   12/22/2013  Patient/family provided with [facility] Department of Clinical Social Work's list of facilities offering this level of care within the geographic area requested by the patient (or if unable, by the patient's family).  12/22/2013  Patient/family informed of their freedom to choose among providers that offer the needed level of care, that participate in Medicare, Medicaid or managed care program needed by the patient, have an available bed and are willing to accept the patient.  12/22/2013  Patient/family informed of [facility]' ownership interest in [facility], as well as of the fact that they are under no obligation to receive care at this facility.  PASARR submitted to EDS on 12/21/2013 PASARR number received on 12/21/2013  FL2 transmitted to all facilities in geographic area requested by pt/family on  12/21/2013 FL2 transmitted to all facilities within larger geographic area on   Patient informed that his/her managed care company has contracts with or will negotiate with  certain facilities, including the following:     Patient/family informed of bed offers received:  12/22/2013 Patient chooses bed at [facility] Physician recommends and patient chooses bed at    Patient to be transferred to [facility] on  12/24/2013 Patient to be transferred to facility by [facility] Patient and family notified of transfer on  12/24/2013  Name of family member notified:  Tor Netters (patient step daughter) to notify  patient husband  The following physician request were entered in Epic:   Additional Comments:

## 2013-12-23 NOTE — Clinical Social Work Note (Signed)
Clinical Social Work Department BRIEF PSYCHOSOCIAL ASSESSMENT 12/21/2013  Patient:  Kristin [name], Kristin [name]     Account Number:  [id number]     Admit date:  12/09/2013  Clinical Social Worker:  Myles Lipps  Date/Time:  12/21/2013 04:00 PM  Referred by:  Care Management  Date Referred:  12/21/2013 Referred for  SNF Placement   Other Referral:   Interview type:  Family Other interview type:   Spoke with patient husband over the phone who referred CSW to his daughter, Kristin [name].    PSYCHOSOCIAL DATA Living Status:  FACILITY Admitted from facility:  [facility] Level of care:  Independent Living Primary support name:  Kristin, [name]  [phone] Primary support relationship to patient:  SPOUSE Degree of support available:   Strong    CURRENT CONCERNS Current Concerns  Post-Acute Placement   Other Concerns:    SOCIAL WORK ASSESSMENT / PLAN Clinical Social Worker spoke with patient husband over the phone to offer support and discuss patient plans at discharge.  Patient husband states that they are currently living at [facility], however patient will require a higher level of care.  Patient husband is agreeable with initiating search in [facility] and requested to follow up with bed offers to his daughter, Kristin [name].  Patient husband adamantly states that they do not want referral sent to [facility].    Clinical Social Worker spoke with patient step-daughter, Kristin [name], to provide available bed offers.  Patient step-daughter to further discuss with patient family and provide CSW with bed choice in order to submit for [facility] authorization.  CSW remains available for support and to facilitate patient discharge needs once medically ready.   Assessment/plan status:  Psychosocial Support/Ongoing Assessment of Needs Other assessment/ plan:   Information/referral to community resources:   Clinical Social Worker attempted to provide patient family with facility list, however they are not  currently at bedside and agreeable to hear offers verbally.  CSW to arrange ambulance transport at discharge.    PATIENT'S/FAMILY'S RESPONSE TO PLAN OF CARE: Patient alert and oriented but with severe expressive aphasia.  Patient husband is very supportive, however not familiar with the SNF process and deferred continued conversation to his daughter.  Patient step daughter understanding and appreciative of CSW role and support.

## 2013-12-24 MED ORDER — TRAMADOL HCL 50 MG PO TABS
50.0000 mg | ORAL_TABLET | Freq: Four times a day (QID) | ORAL | Status: DC | PRN
  Administered 2013-12-24: 50 mg via ORAL
  Filled 2013-12-24: qty 1

## 2013-12-24 MED ORDER — ACETAMINOPHEN 325 MG PO TABS
650.0000 mg | ORAL_TABLET | Freq: Four times a day (QID) | ORAL | Status: DC | PRN
  Administered 2013-12-24: 650 mg via ORAL
  Filled 2013-12-24: qty 2

## 2013-12-24 NOTE — Progress Notes (Addendum)
Pt dressed and prepared for discharge to [facility] via ambulance. Report called to receiving nurse. Husband notified of pt transfer soon.

## 2013-12-24 NOTE — Progress Notes (Signed)
CARDIAC REHAB PHASE I   PRE:  Rate/Rhythm: 74 SR  BP:  Supine:   Sitting: 96/66  Standing:    SaO2: 96%RA  MODE:  Ambulation: 160 ft   POST:  Rate/Rhythm: 85  BP:  Supine:   Sitting: 98/70  Standing:    SaO2: 94%RA 1115-1142 Pt walked 160 ft on RA with gait belt use, rollator and asst x2. Held right hand on rollator during walk. To [facility] and then to recliner after walk. C/o right knee pain. Notified pt's RN.   Graylon Good, RN BSN  12/24/2013 11:39 AM

## 2013-12-24 NOTE — Clinical Social Work Note (Addendum)
Clinical Social Worker facilitated patient discharge including contacting patient family and facility to confirm patient discharge plans.  Clinical information faxed to facility and family agreeable with plan.  CSW received notification that insurance authorization was received.  CSW arranged ambulance transport via [facility] to [facility].  RN to call report prior to discharge.  Clinical Social Worker will sign off for now as social work intervention is no longer needed. Please consult [place] again if new need arises.  Barbette Or, [place]

## 2013-12-24 NOTE — Progress Notes (Addendum)
      [address]       [place] [address]             [phone]        7 Days Post-Op Procedure(s) (LRB): CORONARY ARTERY BYPASS GRAFTING (CABG) (N/A) INTRAOPERATIVE TRANSESOPHAGEAL ECHOCARDIOGRAM (N/A)  Subjective: Patient excited to go to rehab.  Objective: Vital signs in last 24 hours: Temp:  [97.4 F (36.3 C)-98.6 F (37 C)] 98.1 F (36.7 C) (09/04 0559) Pulse Rate:  [69-79] 79 (09/04 0559) Cardiac Rhythm:  [-] Normal sinus rhythm (09/03 2026) Resp:  [18-20] 18 (09/04 0559) BP: (102-122)/(70-80) 112/80 mmHg (09/04 0559) SpO2:  [94 %-96 %] 95 % (09/04 0559)  Pre op weight 82 kg Current Weight  12/23/13 174 lb 3.2 oz (79.017 kg)     09/03 0701 - 09/04 0700 In: 720 [P.O.:720] Out: 650 [Urine:650]  Physical Exam:  Cardiovascular: RRR, no murmurs, gallops, or rubs. Pulmonary: Clear to auscultation bilaterally; no rales, wheezes, or rhonchi. Abdomen: Soft, non tender, bowel sounds present. Extremities: No lower extremity edema. Wounds: Clean and dry. Trace ecchymosis around right evh wound. Small tape tear to left of sternal incision.  Lab Results: CBC: No results found for this basename: WBC, HGB, HCT, PLT,  in the last 72 hours BMET:  No results found for this basename: NA, K, CL, CO2, GLUCOSE, BUN, CREATININE, CALCIUM,  in the last 72 hours  PT/INR:  Lab Results  Component Value Date   INR 1.40 12/17/2013   INR 0.97 12/13/2013   ABG:  INR: Will add last result for INR, ABG once components are confirmed Will add last 4 CBG results once components are confirmed  Assessment/Plan:  1. CV - SR in he 70's. On Atenolol 12.5 bid. 2.  Pulmonary - Encourage incentive spirometer 3.  Acute blood loss anemia - H and H stable at 11.5 and 35.8 4. Neuro-history left CVA with right spastic hemiparesis and  expressive aphasia. Ecasa and Plavix resumed as taken prior to surgery. 5. To SNF when bed available   ZIMMERMAN,DONIELLE  MPA-C 12/24/2013,7:32 AM   Chart reviewed, patient examined, agree with above.

## 2014-01-03 ENCOUNTER — Encounter: Payer: Self-pay | Admitting: *Deleted

## 2014-01-10 ENCOUNTER — Encounter: Payer: Medicare PPO | Admitting: Physician Assistant

## 2014-01-24 ENCOUNTER — Encounter: Payer: Medicare PPO | Admitting: Physician Assistant

## 2014-01-26 ENCOUNTER — Encounter: Payer: Medicare PPO | Admitting: Surgery

## 2014-01-26 ENCOUNTER — Encounter: Payer: Medicare PPO | Admitting: Physician Assistant

## 2014-01-26 ENCOUNTER — Other Ambulatory Visit: Payer: Self-pay | Admitting: Surgery

## 2014-01-26 DIAGNOSIS — I25110 Atherosclerotic heart disease of native coronary artery with unstable angina pectoris: Secondary | ICD-10-CM

## 2014-01-31 ENCOUNTER — Ambulatory Visit
Admission: RE | Admit: 2014-01-31 | Discharge: 2014-01-31 | Disposition: A | Discharge status: Home / Self Care | Payer: Medicare PPO | Source: Ambulatory Visit | Attending: Surgery | Admitting: Surgery

## 2014-01-31 ENCOUNTER — Ambulatory Visit (INDEPENDENT_AMBULATORY_CARE_PROVIDER_SITE_OTHER): Payer: Self-pay | Admitting: Surgical

## 2014-01-31 VITALS — BP 114/72 | HR 69 | Resp 16 | Ht 67.0 in | Wt 176.0 lb

## 2014-01-31 DIAGNOSIS — Z951 Presence of aortocoronary bypass graft: Secondary | ICD-10-CM

## 2014-01-31 DIAGNOSIS — I25110 Atherosclerotic heart disease of native coronary artery with unstable angina pectoris: Secondary | ICD-10-CM

## 2014-01-31 DIAGNOSIS — I2510 Atherosclerotic heart disease of native coronary artery without angina pectoris: Secondary | ICD-10-CM

## 2014-01-31 NOTE — Progress Notes (Signed)
[address]       [place] [address]             [phone]                  Kristin [name] Adelino Medical Record #[id number] Date of Birth: 15-Jun-1940  Referring AC:[id number], Kristin Lange, MD Primary Cardiology: Primary Kristin Crocker, MD  Chief Complaint:  Follow Up Visit   History of Present Illness:    The patient is a 73 year old female status post CABG x2 on 12/17/2013 by Dr. Cyndia Bent. Following discharge she spent some time at the skilled nursing facility but has sense been discharged from there. She continues to have some physical therapy at home. She has a previous CVA history significantly affecting her right side as well as significant aphasia. She is doing well in her postsurgical recovery with no specific complaints other than some mild sternal discomfort relieved typically with Tylenol.         Zubrod Score: At the time of surgery this patient's most appropriate activity status/level should be described as: []     0    Normal activity, no symptoms []     1    Restricted in physical strenuous activity but ambulatory, able to do out light work []     2    Ambulatory and capable of self care, unable to do work activities, up and about                 >50 % of waking hours                                                                                   []     3    Only limited self care, in bed greater than 50% of waking hours []     4    Completely disabled, no self care, confined to bed or chair []     5    Moribund  History  Smoking status  . Never Smoker   Smokeless tobacco  . Never Used       No Known Allergies  Current Outpatient Prescriptions  Medication Sig Dispense Refill  . aspirin 81 MG tablet Take 81 mg by mouth daily.      Marland Kitchen atenolol (TENORMIN) 25 MG tablet Take 12.5 mg by mouth 2 (two) times daily.      Marland Kitchen atorvastatin (LIPITOR) 40 MG tablet Take 80 mg by mouth daily at 6 PM.       . clopidogrel (PLAVIX) 75 MG tablet  Take 75 mg by mouth daily.      . diclofenac sodium (VOLTAREN) 1 % GEL Apply 4 g topically 4 (four) times daily as needed (pain).       Marland Kitchen omeprazole (PRILOSEC) 20 MG capsule Take 20 mg by mouth daily.      Marland Kitchen oxybutynin (DITROPAN) 5 MG tablet Take 5 mg by mouth daily.      . pregabalin (LYRICA) 150 MG capsule Take 150 mg by mouth 2 (two) times daily.       . sertraline (ZOLOFT) 50 MG tablet Take 75 mg by mouth every evening.       Marland Kitchen  trimethoprim (TRIMPEX) 100 MG tablet Take 100 mg by mouth daily.       No current facility-administered medications for this visit.       Physical Exam: BP 114/72  Pulse 69  Resp 16  Ht 5\' 7"  (1.702 m)  SpO2 96%  General appearance: alert, cooperative and no distress Heart: regular rate and rhythm Lungs: clear to auscultation bilaterally Abdomen: benign exam Extremities: no sig edema Wound: incis well healed   Diagnostic Studies & Laboratory data:         Recent Radiology Findings: Dg Chest 2 View  01/31/2014   CLINICAL DATA:  Unstable angina pectoris  EXAM: CHEST  2 VIEW  COMPARISON:  December 20, 2013  FINDINGS: There is underlying emphysematous change. There is scarring in the left lung base. There is no edema or consolidation. The heart is upper normal in size with pulmonary vascularity within normal limits. No adenopathy. Patient is status post coronary artery bypass grafting. There is degenerative change in the thoracic spine. There is lumbar levoscoliosis.  IMPRESSION: Underlying emphysema. Scarring left base. No edema or consolidation. Heart upper normal in size.   Electronically Signed   By: Lowella Grip M.D.   On: 01/31/2014 12:48      Recent Labs: Lab Results  Component Value Date   WBC 8.2 12/20/2013   HGB 11.5* 12/20/2013   HCT 35.8* 12/20/2013   PLT 178 12/20/2013   GLUCOSE 129* 12/20/2013   ALT 13 12/09/2013   AST 17 12/09/2013   NA 142 12/20/2013   K 3.8 12/20/2013   CL 105 12/20/2013   CREATININE 0.62 12/20/2013   BUN 13  12/20/2013   CO2 23 12/20/2013   INR 1.40 12/17/2013   HGBA1C 5.8* 12/16/2013      Assessment / Plan:  The patient is doing quite well. She has still to see cardiology postop. There is no significant surgical issues at this time. We will see again in 6 weeks.           Dillian Feig E 01/31/2014 1:29 PM

## 2014-01-31 NOTE — Patient Instructions (Signed)
Continue therapies

## 2014-02-01 ENCOUNTER — Encounter: Payer: Medicare PPO | Admitting: Physician Assistant

## 2014-02-02 ENCOUNTER — Encounter: Payer: Medicare PPO | Admitting: Surgery

## 2014-03-13 ENCOUNTER — Ambulatory Visit (INDEPENDENT_AMBULATORY_CARE_PROVIDER_SITE_OTHER): Payer: Medicare HMO | Admitting: Family Medicine

## 2014-03-13 ENCOUNTER — Other Ambulatory Visit: Payer: Self-pay | Admitting: Family Medicine

## 2014-03-13 VITALS — BP 124/78 | HR 58 | Temp 97.8°F | Resp 18 | Ht 67.5 in | Wt 175.8 lb

## 2014-03-13 DIAGNOSIS — L989 Disorder of the skin and subcutaneous tissue, unspecified: Secondary | ICD-10-CM

## 2014-03-13 NOTE — Patient Instructions (Signed)
This appears to be a basal cell carcinoma. It small and may need further excision. We are awaiting the pathology report.

## 2014-03-13 NOTE — Progress Notes (Signed)
° °  Subjective:    Patient ID: Kristin [name], female    DOB: 05/13/1940, 73 y.o.   MRN: [id number] This chart was scribed for Robyn Haber, MD by Marti Sleigh, Medical Scribe. This patient was seen in Room 10 and the patient's care was started a 11:22 AM.  Chief Complaint  Patient presents with   Face lesion    been there for a month    HPI HPI Comments: Pt is unable to communicate, due to past CVA. Kristin [name] is a 73 y.o. female with a past hx of HTN, CVA 14 years ago with residual aphasia and weakness, and CAD who presents to [facility] complaining of a skin lesion under her left eye that started three weeks ago.    Review of Systems  Constitutional: Negative for fever and chills.  Skin: Positive for rash and wound.       Under left eye.  Psychiatric/Behavioral: Negative for agitation.       Objective:   Physical Exam  Constitutional: She is oriented to person, place, and time. She appears well-developed and well-nourished.  HENT:  Head: Normocephalic and atraumatic.  Eyes: Pupils are equal, round, and reactive to light.  Neck: Neck supple.  Cardiovascular: Normal rate and regular rhythm.   Pulmonary/Chest: Effort normal and breath sounds normal. No respiratory distress.  Neurological: She is alert and oriented to person, place, and time.  Skin: Skin is warm and dry.  Psychiatric: She has a normal mood and affect. Her behavior is normal.  Nursing note and vitals reviewed.  skin lesion on left cheek is crusty and appears to be in 3 separate areas measuring approximately 2 mm each. These lesions are contiguous and have dilated capillary.  After sterile prep with Betadine, a 2 mm punch biopsy was undertaken following 1% Xylocaine with epi. The specimen was sent for biopsy and then the excision area was glued with Dermabond. Patient tolerated the procedure well     Assessment & Plan:    This chart was scribed in my presence and reviewed by me personally. Skin lesion - Plan:  Dermatology pathology  Signed, Robyn Haber, MD

## 2014-03-14 ENCOUNTER — Other Ambulatory Visit: Payer: Self-pay | Admitting: Surgery

## 2014-03-14 DIAGNOSIS — Z951 Presence of aortocoronary bypass graft: Secondary | ICD-10-CM

## 2014-03-16 ENCOUNTER — Ambulatory Visit: Payer: Medicare PPO | Admitting: Surgery

## 2014-03-21 ENCOUNTER — Ambulatory Visit (INDEPENDENT_AMBULATORY_CARE_PROVIDER_SITE_OTHER): Payer: Self-pay | Admitting: Surgical

## 2014-03-21 ENCOUNTER — Ambulatory Visit
Admission: RE | Admit: 2014-03-21 | Discharge: 2014-03-21 | Disposition: A | Discharge status: Home / Self Care | Payer: Medicare PPO | Source: Ambulatory Visit | Attending: Surgery | Admitting: Surgery

## 2014-03-21 VITALS — BP 96/61 | HR 70 | Resp 16 | Ht 67.0 in | Wt 167.0 lb

## 2014-03-21 DIAGNOSIS — I2510 Atherosclerotic heart disease of native coronary artery without angina pectoris: Secondary | ICD-10-CM

## 2014-03-21 DIAGNOSIS — Z951 Presence of aortocoronary bypass graft: Secondary | ICD-10-CM

## 2014-03-21 NOTE — Progress Notes (Signed)
[address]       [place], [address]             [phone]                  Kristin [name]  Medical Record #[id number] Date of Birth: 1940/08/23  Referring UT:[id number], Charlann Lange, MD Primary Cardiology: Primary Darleen Crocker, MD  Chief Complaint:  Follow Up Visit   History of Present Illness:  The patient continues to do well. She has no surgically related issues. She has still not seen cardiology in follow-up and I instructed her that this is necessary for long-term management of her heart conditions.           Zubrod Score: At the time of surgery this patient's most appropriate activity status/level should be described as: []     0    Normal activity, no symptoms []     1    Restricted in physical strenuous activity but ambulatory, able to do out light work []     2    Ambulatory and capable of self care, unable to do work activities, up and about                 >50 % of waking hours                                                                                   []     3    Only limited self care, in bed greater than 50% of waking hours []     4    Completely disabled, no self care, confined to bed or chair []     5    Moribund  History  Smoking status  . Never Smoker   Smokeless tobacco  . Never Used       No Known Allergies  Current Outpatient Prescriptions  Medication Sig Dispense Refill  . aspirin 81 MG tablet Take 81 mg by mouth daily.    Marland Kitchen atenolol (TENORMIN) 25 MG tablet Take 12.5 mg by mouth 2 (two) times daily.    Marland Kitchen atorvastatin (LIPITOR) 40 MG tablet Take 80 mg by mouth daily at 6 PM.     . clopidogrel (PLAVIX) 75 MG tablet Take 75 mg by mouth daily.    . diclofenac sodium (VOLTAREN) 1 % GEL Apply 4 g topically 4 (four) times daily as needed (pain).     Marland Kitchen omeprazole (PRILOSEC) 20 MG capsule Take 20 mg by mouth daily.    Marland Kitchen oxybutynin (DITROPAN) 5 MG tablet Take 5 mg by mouth daily.    . pregabalin (LYRICA) 150 MG  capsule Take 150 mg by mouth 2 (two) times daily.     . sertraline (ZOLOFT) 50 MG tablet Take 75 mg by mouth every evening.     . trimethoprim (TRIMPEX) 100 MG tablet Take 100 mg by mouth daily.     No current facility-administered medications for this visit.       Physical Exam: BP 96/61 mmHg  Pulse 70  Resp 16  Ht 5\' 7"  (1.702 m)  Wt 167 lb (75.751 kg)  BMI 26.15 kg/m2  SpO2  98%  General appearance: alert, cooperative, no distress and Has stable aphasia and chronic physical changes from her CVA Heart: regular rate and rhythm and S1, S2 normal Lungs: clear to auscultation bilaterally Extremities: no edema, redness or tenderness in the calves or thighs Wound: Incisions all well-healed Wounds:  Diagnostic Studies & Laboratory data:         Recent Radiology Findings: Dg Chest 2 View  03/21/2014   CLINICAL DATA:  CABG.  EXAM: CHEST  2 VIEW  COMPARISON:  None.  FINDINGS: Mediastinum hilar structures are normal. Basilar atelectasis and/or scarring. Heart size normal. No pleural effusion or pneumothorax. No acute bony abnormality. Degenerative changes thoracic spine.  IMPRESSION: 1. Basilar pleural parenchymal thickening consistent with atelectasis and/or scarring. 2. Prior CABG.  Heart size stable.  No CHF.   Electronically Signed   By: Marcello Moores  Register   On: 03/21/2014 13:25      Recent Labs: Lab Results  Component Value Date   WBC 8.2 12/20/2013   HGB 11.5* 12/20/2013   HCT 35.8* 12/20/2013   PLT 178 12/20/2013   GLUCOSE 129* 12/20/2013   ALT 13 12/09/2013   AST 17 12/09/2013   NA 142 12/20/2013   K 3.8 12/20/2013   CL 105 12/20/2013   CREATININE 0.62 12/20/2013   BUN 13 12/20/2013   CO2 23 12/20/2013   INR 1.40 12/17/2013   HGBA1C 5.8* 12/16/2013      Assessment / Plan:  We will see the patient again when necessary for any surgically related issues. I discussed with her husband the importance of long-term cardiology follow-up and they appear to understand the  need to make this appointment.          GOLD,WAYNE E 03/21/2014 2:07 PM

## 2014-03-22 ENCOUNTER — Ambulatory Visit: Payer: Medicare PPO | Admitting: Surgery

## 2014-03-23 ENCOUNTER — Ambulatory Visit: Payer: Medicare PPO | Admitting: Surgery

## 2014-03-30 ENCOUNTER — Encounter (INDEPENDENT_AMBULATORY_CARE_PROVIDER_SITE_OTHER): Payer: Medicare HMO | Admitting: Ophthalmology

## 2014-03-30 DIAGNOSIS — I10 Essential (primary) hypertension: Secondary | ICD-10-CM

## 2014-03-30 DIAGNOSIS — H34811 Central retinal vein occlusion, right eye: Secondary | ICD-10-CM

## 2014-03-30 DIAGNOSIS — H35033 Hypertensive retinopathy, bilateral: Secondary | ICD-10-CM

## 2014-03-30 DIAGNOSIS — H43813 Vitreous degeneration, bilateral: Secondary | ICD-10-CM

## 2014-03-31 ENCOUNTER — Encounter (HOSPITAL_COMMUNITY): Payer: Self-pay | Admitting: Interventional Cardiology

## 2014-05-16 ENCOUNTER — Ambulatory Visit: Payer: Medicare HMO | Admitting: Podiatry

## 2014-05-18 ENCOUNTER — Ambulatory Visit (HOSPITAL_COMMUNITY): Payer: Medicare PPO

## 2014-05-18 ENCOUNTER — Ambulatory Visit: Payer: Medicare HMO | Admitting: Podiatry

## 2014-05-20 ENCOUNTER — Other Ambulatory Visit (HOSPITAL_COMMUNITY): Payer: Self-pay | Admitting: Internal Medicine

## 2014-05-20 DIAGNOSIS — R1310 Dysphagia, unspecified: Secondary | ICD-10-CM

## 2014-05-24 ENCOUNTER — Ambulatory Visit (HOSPITAL_COMMUNITY)
Admission: RE | Admit: 2014-05-24 | Discharge: 2014-05-24 | Disposition: A | Discharge status: Home / Self Care | Payer: Medicare PPO | Source: Ambulatory Visit | Attending: Internal Medicine | Admitting: Internal Medicine

## 2014-05-24 ENCOUNTER — Inpatient Hospital Stay (HOSPITAL_COMMUNITY): Admission: RE | Admit: 2014-05-24 | Payer: Medicare PPO | Source: Ambulatory Visit

## 2014-07-06 ENCOUNTER — Encounter: Payer: Self-pay | Admitting: Physical Medicine & Rehabilitation

## 2014-07-06 ENCOUNTER — Encounter: Payer: Medicare PPO | Attending: Physical Medicine & Rehabilitation | Admitting: Physical Medicine & Rehabilitation

## 2014-07-06 VITALS — BP 112/67 | HR 56 | Resp 14

## 2014-07-06 DIAGNOSIS — G8110 Spastic hemiplegia affecting unspecified side: Secondary | ICD-10-CM | POA: Insufficient documentation

## 2014-07-06 DIAGNOSIS — M1711 Unilateral primary osteoarthritis, right knee: Secondary | ICD-10-CM | POA: Insufficient documentation

## 2014-07-06 DIAGNOSIS — S43001A Unspecified subluxation of right shoulder joint, initial encounter: Secondary | ICD-10-CM | POA: Insufficient documentation

## 2014-07-06 DIAGNOSIS — S43001S Unspecified subluxation of right shoulder joint, sequela: Secondary | ICD-10-CM

## 2014-07-06 NOTE — Patient Instructions (Signed)
TYLENOL UP TO 2500MG  PER DAY  REGULAR ICE TO RIGHT KNEE AND SHOULDER   KEEP RIGHT SHOULDER ELEVATED AND SUPPORTED WHEN YOU CAN.    BE LIBERAL WITH YOUR VOLTAREN GEL UP TO 4 X DAILY

## 2014-07-06 NOTE — Progress Notes (Signed)
Subjective:    Patient ID: Kristin [name], female    DOB: July 15, 1940, 74 y.o.   MRN: [id number]  HPI  Kristin [name] is here today due to increased pain in her RUE and RLE. I last saw her late in 2014. She had a CABG in August. Since the admission and into the fall, she's had more pain on the right side. She's also declined mobility wise as well due to her pain. Kristin [name] has difficulty describing her problems due to her aphasia, but the pain seems most localized to the right shoulder and knee.  Family has made some adaptive changes at home, bed rails, hand rails, etc. OT has seen her also. She is still doing exercises several days per week.   She is wearing her splint at night. She has a splint for her walker. Her shoulder may be subluxed slightly. The pain seems to be localized more to her right shoulder and knee. She is utilizing tylenol and voltaren gel daily. She may take 975mg  of tylenol 3x daily.    Pain Inventory Average Pain 3 Pain Right Now 4 My pain is constant, sharp, dull and aching  In the last 24 hours, has pain interfered with the following? General activity 1 Relation with others 2 Enjoyment of life 3 What TIME of day is your pain at its worst? evening Sleep (in general) Fair  Pain is worse with: inactivity and standing Pain improves with: rest, therapy/exercise and medication Relief from Meds: 2  Mobility walk with assistance use a walker ability to climb steps?  no do you drive?  no transfers alone Do you have any goals in this area?  no  Function disabled: date disabled 04/1999 I need assistance with the following:  household duties and shopping  Neuro/Psych bladder control problems weakness numbness trouble walking spasms confusion loss of taste or smell  Prior Studies Any changes since last visit?  yes coronary bypass graft  Physicians involved in your care Any changes since last visit?  yes Primary care Dr. Janan Halter urologist   Dr.  Matilde Sprang   History reviewed. No pertinent family history. History   Social History  . Marital Status: Married    Spouse Name: N/A  . Number of Children: N/A  . Years of Education: N/A   Social History Main Topics  . Smoking status: Never Smoker   . Smokeless tobacco: Never Used  . Alcohol Use: 0.0 oz/week    0 Standard drinks or equivalent per week     Comment: glas of wine a day  . Drug Use: No  . Sexual Activity: Not on file   Other Topics Concern  . None   Social History Narrative   Past Surgical History  Procedure Laterality Date  . Abdominal hysterectomy    . Carotid stent    . Lasix    . Cataracts    . Retina injection    . Esophagogastroduodenoscopy    . Coronary artery bypass graft N/A 12/17/2013    Procedure: CORONARY ARTERY BYPASS GRAFTING (CABG);  Surgeon: Gaye Pollack, MD;  Location: [facility];  Service: Open Heart Surgery;  Laterality: N/A;  Times 2 using left internal mammary artery and endoscopically harvested right saphenous vein  . Intraoperative transesophageal echocardiogram N/A 12/17/2013    Procedure: INTRAOPERATIVE TRANSESOPHAGEAL ECHOCARDIOGRAM;  Surgeon: Gaye Pollack, MD;  Location: [facility] OR;  Service: Open Heart Surgery;  Laterality: N/A;  . Left heart catheterization with coronary angiogram N/A 12/13/2013    Procedure: LEFT  HEART CATHETERIZATION WITH CORONARY ANGIOGRAM;  Surgeon: Jettie Booze, MD;  Location: [facility] CATH LAB;  Service: Cardiovascular;  Laterality: N/A;   Past Medical History  Diagnosis Date  . Stroke 2001    reesultant Rt spastic hemplegia and aphasia   . Essential hypertension   . CAD (coronary artery disease)     a. 2010: stents pRCA and dRCA. b. LHC 12/13/13: Minimal distal LM dz. LAD 95% ostial w/ L-->R collat. LCx mild dz. RCA 95% mid-dRCA. Patent stents proximal and distal RCA. EF not assessed, Nl by nuclear study.     . Ear pain     right  . Hyperlipidemia with target LDL less than 70   . Central retinal vein occlusion,  right eye   . Diplopia   . Eosinophilic esophagitis   . Incontinence     urine  . GERD (gastroesophageal reflux disease)   . Arthritis     hands   BP 112/67 mmHg  Pulse 56  Resp 14  SpO2 95%  Opioid Risk Score:   Fall Risk Score:    Review of Systems  Genitourinary:       Incontinence  Musculoskeletal: Positive for gait problem.  Neurological: Positive for weakness and numbness.       Spasms   Psychiatric/Behavioral: Positive for confusion.  All other systems reviewed and are negative.      Objective:   Physical Exam General: Alert , No apparent distress  HEENT: Head is normocephalic, atraumatic, PERRLA, EOMI, sclera anicteric, oral mucosa pink and moist, dentition intact, ext ear canals clear,  Neck: Supple without JVD or lymphadenopathy  Heart: Reg rate and rhythm. No murmurs rubs or gallops  Chest: CTA bilaterally without wheezes, rales, or rhonchi; no distress  Abdomen: Soft, non-tender, non-distended, bowel sounds positive.  Extremities: No clubbing, cyanosis, or edema. Pulses are 2+  Skin: Quarter sized open wound in the right temporal region. Area appears clean, but there is substantial serosanguinous drainage. Crusted blood in hair surrounds the wound.  Neuro: Continued aphasia. Has dense HP of RUE still. Has 2-3/5 strength in the proximal RLE with AFO in place for distal control.  She has mild resting tone in the right hand and wrist. She does have diminished pain sense on the right side but IS able to sense pain. She ambulated for me and tends to circumduct and laterally rotate the right leg for clearance but lesser so overall.  Musculoskeletal: she has lateral joint line pain at the right knee with palpation. Mild pain there as well with McMurray's testing. Recurvatum with stance as well as ER. Pain and crepitus at the right TMJ. Right shoulder is tender with abduction at 90 degrees and with impingement maneuver. She has an almost 2 cm sublux as well of the right  humerus from the glenoid fossa Psych: Pt's affect is appropriate. Pt is cooperative  Assessment & Plan:   1. Hx of left MCA infarct with spastic right hemiparesis, expressive greater than receptive aphasia  2. Chronic right knee pain which appears osteoarthritic and is related to her gait pattern.  3. Chronic low back pain, again, likely related to gait pattern. --improved  4. Insomnia, anxiety  5. Constipation 6. Right shoulder pain related to chronic subluxation, mild adhesive capsulitis, chronic spasticity 7. OSA.  8. Right scalp wound after fall--healed  9. Right TMJ?   Plan:  1. TYLENOL for breakthrough pain UP TO 2500 mg per day. 2. Contine with HEP and splinting for RUE--she's doing a GREAT  job with tone management in the hand!.  3. Made a referral to [facility] for right shoulder harness and mods to right AFO to prevent recurvatum and valgus. May need steroid injections there also. We also discussed regular support of shoulder while seated or when resting. 5. More regular use of voltaren gel for the both the knee and the shoulder 6. I will see her back in 6 weeks. 30 minutes of face to face patient care time were spent during this visit. All questions were encouraged and answered.

## 2014-08-03 ENCOUNTER — Encounter (INDEPENDENT_AMBULATORY_CARE_PROVIDER_SITE_OTHER): Payer: Medicare HMO | Admitting: Ophthalmology

## 2014-08-04 DIAGNOSIS — L03039 Cellulitis of unspecified toe: Secondary | ICD-10-CM | POA: Diagnosis not present

## 2014-08-04 DIAGNOSIS — L84 Corns and callosities: Secondary | ICD-10-CM | POA: Diagnosis not present

## 2014-08-04 DIAGNOSIS — L602 Onychogryphosis: Secondary | ICD-10-CM | POA: Diagnosis not present

## 2014-08-17 ENCOUNTER — Encounter: Payer: Medicare PPO | Attending: Physical Medicine & Rehabilitation | Admitting: Physical Medicine & Rehabilitation

## 2014-08-17 ENCOUNTER — Encounter: Payer: Self-pay | Admitting: Physical Medicine & Rehabilitation

## 2014-08-17 ENCOUNTER — Encounter (INDEPENDENT_AMBULATORY_CARE_PROVIDER_SITE_OTHER): Payer: Medicare HMO | Admitting: Ophthalmology

## 2014-08-17 VITALS — BP 99/54 | HR 61 | Resp 14

## 2014-08-17 DIAGNOSIS — I69320 Aphasia following cerebral infarction: Secondary | ICD-10-CM

## 2014-08-17 DIAGNOSIS — H34811 Central retinal vein occlusion, right eye: Secondary | ICD-10-CM

## 2014-08-17 DIAGNOSIS — H43813 Vitreous degeneration, bilateral: Secondary | ICD-10-CM

## 2014-08-17 DIAGNOSIS — I10 Essential (primary) hypertension: Secondary | ICD-10-CM | POA: Diagnosis not present

## 2014-08-17 DIAGNOSIS — H35033 Hypertensive retinopathy, bilateral: Secondary | ICD-10-CM | POA: Diagnosis not present

## 2014-08-17 DIAGNOSIS — M179 Osteoarthritis of knee, unspecified: Secondary | ICD-10-CM | POA: Diagnosis not present

## 2014-08-17 DIAGNOSIS — M1711 Unilateral primary osteoarthritis, right knee: Secondary | ICD-10-CM | POA: Diagnosis not present

## 2014-08-17 DIAGNOSIS — G8110 Spastic hemiplegia affecting unspecified side: Secondary | ICD-10-CM | POA: Insufficient documentation

## 2014-08-17 DIAGNOSIS — S46002S Unspecified injury of muscle(s) and tendon(s) of the rotator cuff of left shoulder, sequela: Secondary | ICD-10-CM

## 2014-08-17 DIAGNOSIS — M7582 Other shoulder lesions, left shoulder: Secondary | ICD-10-CM

## 2014-08-17 DIAGNOSIS — S43001S Unspecified subluxation of right shoulder joint, sequela: Secondary | ICD-10-CM | POA: Insufficient documentation

## 2014-08-17 DIAGNOSIS — S46002A Unspecified injury of muscle(s) and tendon(s) of the rotator cuff of left shoulder, initial encounter: Secondary | ICD-10-CM | POA: Insufficient documentation

## 2014-08-17 NOTE — Progress Notes (Signed)
Subjective:    Patient ID: Kristin [name], female    DOB: [birth year], 74 y.o.   MRN: [id number]  HPI   Kristin [name] is here in follow up of her CVA and associated pain. We ordered a right shoulder harness which fits somewhat. She is unsure how much it's really helping.   Her left shoulder is now bothering her after an awkward position in a CT scan with urology. Her arm has been bothering her at night and when she sleeps. It is easing off a bit but is now worse than the lef.t   She is using THE voltaren gel for her right knee which tends to help a lot.   Adjustments were made to her right AFO which appear to have helped with the recurvatum in her right knee. The patient and family have noticed a differenc.e   Pain Inventory Average Pain 4 Pain Right Now 5 My pain is constant, sharp, burning, stabbing, tingling and aching  In the last 24 hours, has pain interfered with the following? General activity 5 Relation with others 0 Enjoyment of life 5 What TIME of day is your pain at its worst? night Sleep (in general) Poor  Pain is worse with: walking and inactivity Pain improves with: rest, heat/ice and medication Relief from Meds: 4  Mobility walk with assistance use a walker ability to climb steps?  no do you drive?  no transfers alone  Function disabled: date disabled . I need assistance with the following:  meal prep, household duties and shopping  Neuro/Psych bladder control problems weakness  Prior Studies Any changes since last visit?  no  Physicians involved in your care Any changes since last visit?  no   History reviewed. No pertinent family history. History   Social History  . Marital Status: Married    Spouse Name: N/A  . Number of Children: N/A  . Years of Education: N/A   Social History Main Topics  . Smoking status: Never Smoker   . Smokeless tobacco: Never Used  . Alcohol Use: 0.0 oz/week    0 Standard drinks or equivalent per week     Comment: glas of  wine a day  . Drug Use: No  . Sexual Activity: Not on file   Other Topics Concern  . None   Social History Narrative   Past Surgical History  Procedure Laterality Date  . Abdominal hysterectomy    . Carotid stent    . Lasix    . Cataracts    . Retina injection    . Esophagogastroduodenoscopy    . Coronary artery bypass graft N/A 12/17/2013    Procedure: CORONARY ARTERY BYPASS GRAFTING (CABG);  Surgeon: Gaye Pollack, MD;  Location: [place];  Service: Open Heart Surgery;  Laterality: N/A;  Times 2 using left internal mammary artery and endoscopically harvested right saphenous vein  . Intraoperative transesophageal echocardiogram N/A 12/17/2013    Procedure: INTRAOPERATIVE TRANSESOPHAGEAL ECHOCARDIOGRAM;  Surgeon: Gaye Pollack, MD;  Location: [facility] OR;  Service: Open Heart Surgery;  Laterality: N/A;  . Left heart catheterization with coronary angiogram N/A 12/13/2013    Procedure: LEFT HEART CATHETERIZATION WITH CORONARY ANGIOGRAM;  Surgeon: Jettie Booze, MD;  Location: [facility] CATH LAB;  Service: Cardiovascular;  Laterality: N/A;   Past Medical History  Diagnosis Date  . Stroke 2001    reesultant Rt spastic hemplegia and aphasia   . Essential hypertension   . CAD (coronary artery disease)     a. 2010: stents pRCA  and dRCA. b. LHC 12/13/13: Minimal distal LM dz. LAD 95% ostial w/ L-->R collat. LCx mild dz. RCA 95% mid-dRCA. Patent stents proximal and distal RCA. EF not assessed, Nl by nuclear study.     . Ear pain     right  . Hyperlipidemia with target LDL less than 70   . Central retinal vein occlusion, right eye   . Diplopia   . Eosinophilic esophagitis   . Incontinence     urine  . GERD (gastroesophageal reflux disease)   . Arthritis     hands   BP 99/54 mmHg  Pulse 61  Resp 14  SpO2 94%  Opioid Risk Score:   Fall Risk Score:  `1  Depression screen PHQ 2/9  No flowsheet data found.   Review of Systems  Genitourinary:       Incontinence Chronic UTI    Neurological: Positive for weakness.  All other systems reviewed and are negative.      Objective:   Physical Exam  General: Alert , No apparent distress  HEENT: Head is normocephalic, atraumatic, PERRLA, EOMI, sclera anicteric, oral mucosa pink and moist, dentition intact, ext ear canals clear,  Neck: Supple without JVD or lymphadenopathy  Heart: Reg rate and rhythm. No murmurs rubs or gallops  Chest: CTA bilaterally without wheezes, rales, or rhonchi; no distress  Abdomen: Soft, non-tender, non-distended, bowel sounds positive.  Extremities: No clubbing, cyanosis, or edema. Pulses are 2+  Skin: intact  Neuro: Continued aphasia. Has dense HP of RUE still. Has 2-3/5 strength in the proximal RLE with AFO in place for distal control. She has mild resting tone in the right hand and wrist. She does have diminished pain sense on the right side but IS able to sense pain. She ambulated with less circumduction, recurvatum improved also.  Musculoskeletal: she has lateral joint line pain at the right knee with palpation. Mild pain there as well with McMurray's testing. Recurvatum with stance as well as ER. Pain and crepitus at the right TMJ. Right shoulder is less tender w  with impingement maneuver. She has an almost 1-2 cm sublux as well of the right humerus from the glenoid fossa. Harness is loose and was adjusted. When tighter it appears to be providing some support. She has tenderness with ir/er of left shoulder with pain on palpation in the left subdeltoid area. Impingement + on left. Psych: Pt's affect is appropriate. Pt is cooperative   Assessment & Plan:   1. Hx of left MCA infarct with spastic right hemiparesis, expressive greater than receptive aphasia  2. Chronic right knee pain which appears osteoarthritic and is related to her gait pattern.  3. Chronic low back pain, again, likely related to gait pattern. --improved  4. Insomnia, anxiety  5. Constipation 6. Right shoulder pain related  to chronic subluxation, mild adhesive capsulitis, chronic spasticity  7. OSA.  8. Right scalp wound after fall--healed  9. Right TMJ?   Plan:  1. TYLENOL for breakthrough pain UP TO 2500 mg per day.  2. After informed consent and preparation of the skin with betadine and isopropyl alcohol, I injected 6mg  (1cc) of celestone and 4cc of 1% lidocaine int the left subacromial space via lateral approach. Additionally, aspiration was performed prior to injection. The patient tolerated well, and no complications were encountered. Afterward the area was cleaned and dressed. Post- injection instructions were provided. No injection for left shoulder today   3. Follow up with [facility] about adjustments to right shoulder harness. This  needs to be fitting snuggly to be effective 4. Appropriate gait mechanics and techniques to reduce knee strain. 5. regular use of voltaren gel for the both the knee and the shoulder  6. I will see her back in 3 months. 30 minutes of face to face patient care time were spent during this visit. All questions were encouraged and answered.

## 2014-08-17 NOTE — Patient Instructions (Signed)
CONTACT [facility] ABOUT ADJUSTMENTS TO YOUR BRACE.

## 2014-08-18 DIAGNOSIS — L03039 Cellulitis of unspecified toe: Secondary | ICD-10-CM | POA: Diagnosis not present

## 2014-10-17 DIAGNOSIS — N3020 Other chronic cystitis without hematuria: Secondary | ICD-10-CM | POA: Diagnosis not present

## 2014-10-17 DIAGNOSIS — N319 Neuromuscular dysfunction of bladder, unspecified: Secondary | ICD-10-CM | POA: Diagnosis not present

## 2014-10-17 DIAGNOSIS — N200 Calculus of kidney: Secondary | ICD-10-CM | POA: Diagnosis not present

## 2014-10-17 DIAGNOSIS — N3941 Urge incontinence: Secondary | ICD-10-CM | POA: Diagnosis not present

## 2014-10-21 DIAGNOSIS — N319 Neuromuscular dysfunction of bladder, unspecified: Secondary | ICD-10-CM | POA: Diagnosis not present

## 2014-10-21 DIAGNOSIS — N3020 Other chronic cystitis without hematuria: Secondary | ICD-10-CM | POA: Diagnosis not present

## 2014-10-21 DIAGNOSIS — N3941 Urge incontinence: Secondary | ICD-10-CM | POA: Diagnosis not present

## 2014-10-21 DIAGNOSIS — N390 Urinary tract infection, site not specified: Secondary | ICD-10-CM | POA: Diagnosis not present

## 2014-10-21 DIAGNOSIS — R351 Nocturia: Secondary | ICD-10-CM | POA: Diagnosis not present

## 2014-11-03 DIAGNOSIS — L84 Corns and callosities: Secondary | ICD-10-CM | POA: Diagnosis not present

## 2014-11-03 DIAGNOSIS — B351 Tinea unguium: Secondary | ICD-10-CM | POA: Diagnosis not present

## 2014-11-09 DIAGNOSIS — N319 Neuromuscular dysfunction of bladder, unspecified: Secondary | ICD-10-CM | POA: Diagnosis not present

## 2014-11-09 DIAGNOSIS — N3941 Urge incontinence: Secondary | ICD-10-CM | POA: Diagnosis not present

## 2014-11-09 DIAGNOSIS — N200 Calculus of kidney: Secondary | ICD-10-CM | POA: Diagnosis not present

## 2014-11-09 DIAGNOSIS — R351 Nocturia: Secondary | ICD-10-CM | POA: Diagnosis not present

## 2014-11-09 DIAGNOSIS — N3020 Other chronic cystitis without hematuria: Secondary | ICD-10-CM | POA: Diagnosis not present

## 2014-11-11 ENCOUNTER — Encounter: Payer: Self-pay | Admitting: Physical Medicine & Rehabilitation

## 2014-11-11 ENCOUNTER — Encounter: Payer: Medicare PPO | Attending: Physical Medicine & Rehabilitation | Admitting: Physical Medicine & Rehabilitation

## 2014-11-11 VITALS — BP 104/63 | HR 57 | Resp 14

## 2014-11-11 DIAGNOSIS — M1711 Unilateral primary osteoarthritis, right knee: Secondary | ICD-10-CM | POA: Diagnosis not present

## 2014-11-11 DIAGNOSIS — G8110 Spastic hemiplegia affecting unspecified side: Secondary | ICD-10-CM

## 2014-11-11 DIAGNOSIS — I69320 Aphasia following cerebral infarction: Secondary | ICD-10-CM

## 2014-11-11 DIAGNOSIS — S46002S Unspecified injury of muscle(s) and tendon(s) of the rotator cuff of left shoulder, sequela: Secondary | ICD-10-CM | POA: Diagnosis not present

## 2014-11-11 DIAGNOSIS — S43001S Unspecified subluxation of right shoulder joint, sequela: Secondary | ICD-10-CM | POA: Diagnosis not present

## 2014-11-11 NOTE — Patient Instructions (Signed)

## 2014-11-11 NOTE — Progress Notes (Signed)
Subjective:    Patient ID: Kristin [name], female    DOB: 1941/04/06, 74 y.o.   MRN: [id number]  HPI   Kristin [name] is here in follow up of her CVA. The left shoulder has been better since injection. It still bothers her with overhead activities and sometimes with transfers. Daughter reports that husband feels there mobility has declined over the last few months. He feels that transfers are a little more difficult. Daughter is unsure. They have installed some adaptive equipment in the house to assist with movement/safety which help.  She is primarily using tylenol for pain.   Pain Inventory Average Pain 6 Pain Right Now 5 My pain is constant, dull, tingling and aching  In the last 24 hours, has pain interfered with the following? General activity 5 Relation with others 5 Enjoyment of life 5 What TIME of day is your pain at its worst? night Sleep (in general) Fair  Pain is worse with: bending and inactivity Pain improves with: rest, therapy/exercise and medication Relief from Meds: 2  Mobility walk with assistance use a walker how many minutes can you walk? 10 ability to climb steps?  no do you drive?  no  Function disabled: date disabled . I need assistance with the following:  meal prep, household duties and shopping Do you have any goals in this area?  no  Neuro/Psych bladder control problems weakness numbness tingling trouble walking depression  Prior Studies Any changes since last visit?  no  Physicians involved in your care Any changes since last visit?  no   History reviewed. No pertinent family history. History   Social History  . Marital Status: Married    Spouse Name: N/A  . Number of Children: N/A  . Years of Education: N/A   Social History Main Topics  . Smoking status: Never Smoker   . Smokeless tobacco: Never Used  . Alcohol Use: 0.0 oz/week    0 Standard drinks or equivalent per week     Comment: glas of wine a day  . Drug Use: No  . Sexual  Activity: Not on file   Other Topics Concern  . None   Social History Narrative   Past Surgical History  Procedure Laterality Date  . Abdominal hysterectomy    . Carotid stent    . Lasix    . Cataracts    . Retina injection    . Esophagogastroduodenoscopy    . Coronary artery bypass graft N/A 12/17/2013    Procedure: CORONARY ARTERY BYPASS GRAFTING (CABG);  Surgeon: Gaye Pollack, MD;  Location: [place];  Service: Open Heart Surgery;  Laterality: N/A;  Times 2 using left internal mammary artery and endoscopically harvested right saphenous vein  . Intraoperative transesophageal echocardiogram N/A 12/17/2013    Procedure: INTRAOPERATIVE TRANSESOPHAGEAL ECHOCARDIOGRAM;  Surgeon: Gaye Pollack, MD;  Location[facility] OR;  Service: Open Heart Surgery;  Laterality: N/A;  . Left heart catheterization with coronary angiogram N/A 12/13/2013    Procedure: LEFT HEART CATHETERIZATION WITH CORONARY ANGIOGRAM;  Surgeon: Jettie Booze, MD;  Location: [facility] CATH LAB;  Service: Cardiovascular;  Laterality: N/A;   Past Medical History  Diagnosis Date  . Stroke 2001    reesultant Rt spastic hemplegia and aphasia   . Essential hypertension   . CAD (coronary artery disease)     a. 2010: stents pRCA and dRCA. b. LHC 12/13/13: Minimal distal LM dz. LAD 95% ostial w/ L-->R collat. LCx mild dz. RCA 95% mid-dRCA. Patent stents proximal and  distal RCA. EF not assessed, Nl by nuclear study.     . Ear pain     right  . Hyperlipidemia with target LDL less than 70   . Central retinal vein occlusion, right eye   . Diplopia   . Eosinophilic esophagitis   . Incontinence     urine  . GERD (gastroesophageal reflux disease)   . Arthritis     hands   BP 104/63 mmHg  Pulse 57  Resp 14  SpO2 96%  Opioid Risk Score:   Fall Risk Score:  `1  Depression screen PHQ 2/9  Depression screen PHQ 2/9 11/11/2014  Decreased Interest 0  Down, Depressed, Hopeless 0  PHQ - 2 Score 0     Review of Systems    Gastrointestinal: Positive for abdominal pain.  Genitourinary: Positive for dysuria.  Neurological: Positive for weakness and numbness.       Tingling   Psychiatric/Behavioral: Positive for dysphoric mood.  All other systems reviewed and are negative.      Objective:   Physical Exam  General: Alert , No apparent distress  HEENT: Head is normocephalic, atraumatic, PERRLA, EOMI, sclera anicteric, oral mucosa pink and moist, dentition intact, ext ear canals clear,  Neck: Supple without JVD or lymphadenopathy  Heart: Reg rate and rhythm. No murmurs rubs or gallops  Chest: CTA bilaterally without wheezes, rales, or rhonchi; no distress  Abdomen: Soft, non-tender, non-distended, bowel sounds positive.  Extremities: No clubbing, cyanosis, or edema. Pulses are 2+  Skin: intact  Neuro: Continued aphasia. Is better with automatic phrases. I think her receptive language skills have improved also. Has dense HP of RUE but with 1+  Bicep, pecs, deltoid now.  Has 2-3/5 strength in the proximal RLE with AFO in place for distal control. She has mild resting tone in the right hand and wrist. She does have diminished pain sense on the right side but IS able to sense pain. She ambulated with less circumduction, recurvatum improved also.  Musculoskeletal: she has lateral joint line pain at the right knee with palpation. Mild pain there as well with McMurray's testing. Recurvatum with stance as well as ER. Pain and crepitus at the right TMJ. Right shoulder is less tender w with impingement maneuver. She has an almost 1.5 cm sublux as well of the right humerus from the glenoid fossa. Harness is appropriately fitting today. She has tenderness with ir/er of left shoulder but remains improved compared to before injection Psych: Pt's affect is appropriate. Pt is cooperative    Assessment & Plan:   1. Hx of left MCA infarct with spastic right hemiparesis, expressive greater than receptive aphasia  2. Chronic right  knee pain which appears osteoarthritic and is related to her gait pattern.  3. Chronic low back pain, again, likely related to gait pattern. --improved  4. Insomnia, anxiety  5. Constipation 6. Right shoulder pain related to chronic subluxation, mild adhesive capsulitis, chronic spasticity  7. OSA.  8. Left rotator cuff tendonitis/bursitis 9. Right TMJ?    Plan:  1. May continue with tylenol for breakthrough pain  2. No injection for left shoulder today but may need one today. Discussed scapular stabilization exercises today as well as RTC exercises all of which were provided to daughter. 3. Continue right shoulder harness. This seems to have provided some benefit  4. We all discussed the fact that she needs to be reasonable with her activities and shouldn't "over do it" with her LUE. Kristin [name] expressed an understanding.  I don't really see a decline neurologically since our last visit---if anything motorically and language-wise she has shown improvement! 5. Continue use of voltaren gel for the both the knee and the shoulder  6. I will see her back in 4 months. 30 minutes of face to face patient care time were spent during this visit. All questions were encouraged and answered.

## 2014-12-12 DIAGNOSIS — N200 Calculus of kidney: Secondary | ICD-10-CM | POA: Diagnosis not present

## 2014-12-12 DIAGNOSIS — N3941 Urge incontinence: Secondary | ICD-10-CM | POA: Diagnosis not present

## 2014-12-12 DIAGNOSIS — N3020 Other chronic cystitis without hematuria: Secondary | ICD-10-CM | POA: Diagnosis not present

## 2014-12-21 ENCOUNTER — Encounter (INDEPENDENT_AMBULATORY_CARE_PROVIDER_SITE_OTHER): Payer: Medicare PPO | Admitting: Ophthalmology

## 2014-12-21 DIAGNOSIS — I10 Essential (primary) hypertension: Secondary | ICD-10-CM

## 2014-12-21 DIAGNOSIS — H43813 Vitreous degeneration, bilateral: Secondary | ICD-10-CM | POA: Diagnosis not present

## 2014-12-21 DIAGNOSIS — H35033 Hypertensive retinopathy, bilateral: Secondary | ICD-10-CM

## 2014-12-21 DIAGNOSIS — H34811 Central retinal vein occlusion, right eye: Secondary | ICD-10-CM | POA: Diagnosis not present

## 2015-01-19 DIAGNOSIS — N3020 Other chronic cystitis without hematuria: Secondary | ICD-10-CM | POA: Diagnosis not present

## 2015-01-19 DIAGNOSIS — N3941 Urge incontinence: Secondary | ICD-10-CM | POA: Diagnosis not present

## 2015-01-19 DIAGNOSIS — N319 Neuromuscular dysfunction of bladder, unspecified: Secondary | ICD-10-CM | POA: Diagnosis not present

## 2015-01-19 DIAGNOSIS — N200 Calculus of kidney: Secondary | ICD-10-CM | POA: Diagnosis not present

## 2015-01-23 DIAGNOSIS — N644 Mastodynia: Secondary | ICD-10-CM | POA: Diagnosis not present

## 2015-01-23 DIAGNOSIS — Z853 Personal history of malignant neoplasm of breast: Secondary | ICD-10-CM | POA: Diagnosis not present

## 2015-02-03 DIAGNOSIS — B351 Tinea unguium: Secondary | ICD-10-CM | POA: Diagnosis not present

## 2015-02-03 DIAGNOSIS — L84 Corns and callosities: Secondary | ICD-10-CM | POA: Diagnosis not present

## 2015-03-01 ENCOUNTER — Encounter (INDEPENDENT_AMBULATORY_CARE_PROVIDER_SITE_OTHER): Payer: Medicare PPO | Admitting: Ophthalmology

## 2015-03-01 DIAGNOSIS — H35033 Hypertensive retinopathy, bilateral: Secondary | ICD-10-CM | POA: Diagnosis not present

## 2015-03-01 DIAGNOSIS — H43813 Vitreous degeneration, bilateral: Secondary | ICD-10-CM | POA: Diagnosis not present

## 2015-03-01 DIAGNOSIS — I10 Essential (primary) hypertension: Secondary | ICD-10-CM | POA: Diagnosis not present

## 2015-03-01 DIAGNOSIS — H34811 Central retinal vein occlusion, right eye, with macular edema: Secondary | ICD-10-CM | POA: Diagnosis not present

## 2015-03-13 ENCOUNTER — Encounter: Payer: Self-pay | Admitting: Physical Medicine & Rehabilitation

## 2015-03-13 ENCOUNTER — Encounter: Payer: Medicare PPO | Attending: Physical Medicine & Rehabilitation | Admitting: Physical Medicine & Rehabilitation

## 2015-03-13 VITALS — BP 107/46 | HR 60

## 2015-03-13 DIAGNOSIS — M1711 Unilateral primary osteoarthritis, right knee: Secondary | ICD-10-CM

## 2015-03-13 DIAGNOSIS — I69351 Hemiplegia and hemiparesis following cerebral infarction affecting right dominant side: Secondary | ICD-10-CM | POA: Insufficient documentation

## 2015-03-13 DIAGNOSIS — K219 Gastro-esophageal reflux disease without esophagitis: Secondary | ICD-10-CM | POA: Diagnosis not present

## 2015-03-13 DIAGNOSIS — Z8673 Personal history of transient ischemic attack (TIA), and cerebral infarction without residual deficits: Secondary | ICD-10-CM | POA: Insufficient documentation

## 2015-03-13 DIAGNOSIS — E785 Hyperlipidemia, unspecified: Secondary | ICD-10-CM | POA: Diagnosis not present

## 2015-03-13 DIAGNOSIS — M779 Enthesopathy, unspecified: Secondary | ICD-10-CM | POA: Diagnosis not present

## 2015-03-13 DIAGNOSIS — S46002A Unspecified injury of muscle(s) and tendon(s) of the rotator cuff of left shoulder, initial encounter: Secondary | ICD-10-CM | POA: Diagnosis not present

## 2015-03-13 DIAGNOSIS — G4700 Insomnia, unspecified: Secondary | ICD-10-CM | POA: Diagnosis not present

## 2015-03-13 DIAGNOSIS — F419 Anxiety disorder, unspecified: Secondary | ICD-10-CM | POA: Insufficient documentation

## 2015-03-13 DIAGNOSIS — H532 Diplopia: Secondary | ICD-10-CM | POA: Insufficient documentation

## 2015-03-13 DIAGNOSIS — G4733 Obstructive sleep apnea (adult) (pediatric): Secondary | ICD-10-CM | POA: Diagnosis not present

## 2015-03-13 DIAGNOSIS — M545 Low back pain: Secondary | ICD-10-CM | POA: Diagnosis not present

## 2015-03-13 DIAGNOSIS — G8929 Other chronic pain: Secondary | ICD-10-CM | POA: Insufficient documentation

## 2015-03-13 DIAGNOSIS — M25561 Pain in right knee: Secondary | ICD-10-CM | POA: Diagnosis not present

## 2015-03-13 DIAGNOSIS — R4701 Aphasia: Secondary | ICD-10-CM | POA: Insufficient documentation

## 2015-03-13 DIAGNOSIS — S46002S Unspecified injury of muscle(s) and tendon(s) of the rotator cuff of left shoulder, sequela: Secondary | ICD-10-CM | POA: Diagnosis not present

## 2015-03-13 DIAGNOSIS — G8110 Spastic hemiplegia affecting unspecified side: Secondary | ICD-10-CM | POA: Diagnosis not present

## 2015-03-13 DIAGNOSIS — I2510 Atherosclerotic heart disease of native coronary artery without angina pectoris: Secondary | ICD-10-CM | POA: Diagnosis not present

## 2015-03-13 DIAGNOSIS — S43001S Unspecified subluxation of right shoulder joint, sequela: Secondary | ICD-10-CM | POA: Diagnosis not present

## 2015-03-13 DIAGNOSIS — M25511 Pain in right shoulder: Secondary | ICD-10-CM | POA: Insufficient documentation

## 2015-03-13 DIAGNOSIS — K5900 Constipation, unspecified: Secondary | ICD-10-CM | POA: Diagnosis not present

## 2015-03-13 DIAGNOSIS — M7522 Bicipital tendinitis, left shoulder: Secondary | ICD-10-CM | POA: Insufficient documentation

## 2015-03-13 NOTE — Patient Instructions (Addendum)
PLEASE CALL ME WITH ANY PROBLEMS OR QUESTIONS [id number]). HAVE A HAPPY HOLIDAY SEASON!!!   REMEMBER TO PACE YOURSELF AND USE GOOD TECHNIQUEBiceps Tendon Disruption (Proximal) With Rehab The biceps tendon attaches the biceps muscle to the bones of the elbow and the shoulder. A proximal biceps tendon disruption is a tear of the long head of the tendon that attaches near the shoulder (more common) or a tear in the muscle near the muscle tendon junction (less common). These injuries usually involve a complete tear of the tendon from the bone. However, partial tears are also possible. The biceps muscle works with other muscles to bend the elbow and rotate the palm upward (supinate). A complete biceps rupture will result in about a 10% decrease in elbow bending strength and a 20% decrease in your ability to turn the palm upward, using the wrist. SYMPTOMS   Pain, tenderness, swelling, warmth, or redness over the front of the shoulder.  Pain that gets worse with shoulder and elbow use, especially against resistance (lifting or carrying).  Bruising (contusion) in the arm or elbow after 24 to 48 hours.  Bulge can be seen and felt in the arm.  Limited motion of the shoulder or elbow.  Weakness with attempted elbow bending or rotation of the wrist, such as when using a screwdriver.  Crackling sound (crepitation) when the tendon or shoulder is moved or touched. CAUSES  A biceps tendon rupture occurs when the tendon is subjected to a force that is greater than it can withstand. For example, a sudden force straightening the elbow while the biceps is flexed, or a direct hit (trauma) (rare). RISK INCREASES WITH:   Sports that involve contact, or throwing and overhead activities (racquet sports, gymnastics, weightlifting, bodybuilding).  Heavy labor.  Poor strength and flexibility.  Failure to warm up properly before activity. PREVENTION  Warm up and stretch properly before activity.  Maintain  physical fitness:  Strength, flexibility, and endurance.  Cardiovascular fitness.  Allow your body to recover between practices and competition.  Learn and use proper exercise technique. PROGNOSIS  If treated properly, the symptoms of a proximal biceps tendon disruption usually go away within 12 weeks of injury.  RELATED COMPLICATIONS  Longer healing time if not properly treated, or if not given enough time to heal.  Recurring symptoms, especially if activity is resumed too soon.  Weakness of elbow bending and forearm rotation.  Prolonged disability (uncommon). TREATMENT Treatment first involves the use of ice and medicine, to reduce pain and inflammation. It is also important to perform strengthening and stretching exercises and to modify activities that cause symptoms to get worse. These exercises may be performed at home or with a therapist. It is not possible to surgically fix the tendon (sew it together). However, surgery may be performed to reinsert the tendon into the arm bone. This will make the arm look normal again. Surgery is most often advised for younger, active individuals, especially those who require strength of wrist rotation.  MEDICATION  If pain medicine is needed, nonsteroidal anti-inflammatory medicines (aspirin and ibuprofen), or other minor pain relievers (acetaminophen), are often advised.  Do not take pain medicine for 7 days before surgery.  Prescription pain relievers may be given if your caregiver thinks they are needed. Use only as directed and only as much as you need.  Corticosteroid injections may be given to help reduce inflammation, but are not usually recommended for this injury. HEAT AND COLD  Cold treatment (icing) should be applied for 10  to 15 minutes every 2 to 3 hours for inflammation and pain, and immediately after activity that aggravates your symptoms. Use ice packs or an ice massage.  Heat treatment may be used before performing  stretching and strengthening activities prescribed by your caregiver, physical therapist, or athletic trainer. Use a heat pack or a warm water soak. SEEK MEDICAL CARE IF:   Symptoms get worse or do not improve in 2 weeks, despite treatment.  New, unexplained symptoms develop. (Drugs used in treatment may produce side effects.) EXERCISES RANGE OF MOTION (ROM) AND STRETCHING EXERCISES - Biceps Tendon Disruption (Proximal) These exercises may help you when beginning to rehabilitate your injury. Your symptoms may go away with or without further involvement from your physician, physical therapist or athletic trainer. While completing these exercises, remember:   Restoring tissue flexibility helps normal motion to return to the joints. This allows healthier, less painful movement and activity.  An effective stretch should be held for at least 30 seconds.  A stretch should never be painful. You should only feel a gentle lengthening or release in the stretched tissue. STRETCH - Flexion, Standing  Stand with good posture. With an underhand grip on your right / left hand and an overhand grip on the opposite hand, grasp a broomstick or cane so that your hands are a little more than shoulder width apart.  Keeping your right / left elbow straight and shoulder muscles relaxed, push the stick with your opposite hand to raise your right / left arm in front of your body and then overhead. Raise your arm until you feel a stretch in your right / left shoulder, but before you have increased shoulder pain.  Try to avoid shrugging your right / left shoulder as your arm rises by keeping your shoulder blade tucked down and toward your mid-back spine. Hold __________ seconds.  Slowly return to the starting position. Repeat __________ times. Complete this exercise __________ times per day. STRETCH - Abduction, Supine  Stand with good posture. With an underhand grip on your right / left hand and an overhand grip on  the opposite hand, grasp a broomstick or cane so that your hands are a little more than shoulder-width apart.  Keeping your right / left elbow straight and shoulder muscles relaxed, push the stick with your opposite hand to raise your right / left arm out to the side of your body and then overhead. Raise your arm until you feel a stretch in your right / left shoulder, but before you have increased shoulder pain.  Try to avoid shrugging your right / left shoulder as your arm rises, by keeping your shoulder blade tucked down and toward your mid-back spine. Hold for __________ seconds.  Slowly return to the starting position. Repeat __________ times. Complete this exercise __________ times per day. ROM - Flexion, Active-Assisted  Lie on your back. You may bend your knees for comfort.  Grasp a broomstick or cane so your hands are about shoulder width apart. Your right / left hand should grip the end of the stick so that your hand is positioned "thumbs-up," as if you were about to shake hands.  Using your healthy arm to lead, raise your right / left arm overhead until you feel a gentle stretch in your shoulder. Hold for right / left seconds.  Use the stick to assist in returning your right / left arm to its starting position. Repeat __________ times. Complete this exercise __________ times per day.  STRETCH - Flexion, Standing  Stand facing a wall. Walk your right / left fingers up the wall until you feel a moderate stretch in your shoulder. As your hand gets higher, you may need to step closer to the wall or use a door frame to walk through.  Try to avoid shrugging your right / left shoulder as your arm rises, by keeping your shoulder blade tucked down and toward your mid-back spine.  Hold for __________ seconds. Use your other hand, if needed, to ease out of the stretch and return to the starting position. Repeat __________ times. Complete this exercise __________ times per day.  ROM -  Internal Rotation   Using underhand grips, grasp a stick behind your back with both hands.  While standing upright with good posture, slide the stick up your back until you feel a mild stretch in the front of your shoulder.  Hold for __________ seconds. Slowly return to your starting position. Repeat __________ times. Complete this exercise __________ times per day.  STRETCH - Internal Rotation  Place your right / left hand behind your back, palm-up.  Throw a towel or belt over your opposite shoulder. Grasp the towel with your right / left hand.  While keeping an upright posture, gently pull up on the towel until you feel a stretch in the front of your right / left shoulder.  Avoid shrugging your right / left shoulder as your arm rises, by keeping your shoulder blade tucked down and toward your mid-back spine.  Hold for __________ seconds. Release the stretch by lowering your opposite hand. Repeat __________ times. Complete this exercise __________ times per day. STRETCH - Elbow Flexors   Lie on a firm bed or countertop on your back. Be sure that you are in a comfortable position which will allow you to relax your arm muscles.  Place a folded towel under your right / left upper arm, so that your elbow and shoulder are at the same height. Extend your arm; your elbow should not rest on the bed or towel.  Allow the weight of your hand to straighten your elbow. Keep your arm and chest muscles relaxed. Your caregiver may ask you to increase the intensity of your stretch by adding a small wrist or hand weight.  Hold for __________ seconds. You should feel a stretch on the inside of your elbow. Slowly return to the starting position. Repeat __________ times. Complete this exercise __________ times per day. STRENGTHENING EXERCISES - Biceps Tendon Disruption (Proximal) These exercises may help you regain your strength after your physician has discontinued your restraint in a cast or brace. They  may resolve your symptoms with or without further involvement from your physician, physical therapist or athletic trainer. While completing these exercises, remember:   Muscles can gain both the endurance and the strength needed for everyday activities through controlled exercises.  Complete these exercises as instructed by your physician, physical therapist or athletic trainer. Progress the resistance and repetitions only as guided.  You may experience muscle soreness or fatigue, but the pain or discomfort you are trying to eliminate should never worsen during these exercises. If this pain does get worse, stop and make sure you are following the directions exactly. If the pain is still present after adjustments, discontinue the exercise until you can discuss the trouble with your clinician. STRENGTH - Elbow Flexors, Isometric   Stand or sit upright on a firm surface. Place your right / left arm so that your hand is palm-up and at the height of  your waist.  Place your opposite hand on top of your forearm. Gently push down as your right / left arm resists. Push as hard as you can with both arms, without causing any pain or movement at your right / left elbow. Hold this stationary position for __________ seconds.  Gradually release the tension in both arms. Allow your muscles to relax completely before repeating. Repeat __________ times. Complete this exercise __________ times per day. STRENGTH - Shoulder Flexion, Isometric  With good posture and facing a wall, stand or sit about 4-6 inches away.  Keeping your right / left elbow straight, gently press the top of your fist into the wall. Increase the pressure gradually until you are pressing as hard as you can without shrugging your shoulder or increasing any shoulder discomfort.  Hold for __________ seconds.  Release the tension slowly. Relax your shoulder muscles completely before you the next repetition. Repeat __________ times. Complete this  exercise __________ times per day.  STRENGTH - Elbow Flexors, Supinated  With good posture, stand or sit on a firm chair without armrests. Allow your right / left arm to rest at your side with your palm facing forward.  Holding a __________weight or gripping a rubber exercise band or tubing, bring your hand toward your shoulder.  Allow your muscles to control the resistance as your hand returns to your side. Repeat __________ times. Complete this exercise __________ times per day.  STRENGTH - Elbow Flexors, Neutral  With good posture, stand or sit on a firm chair without armrests. Allow your right / left arm to rest at your side with your thumb facing forward.  Holding a __________ weight or gripping a rubber exercise band or tubing, bring your hand toward your shoulder.  Allow your muscles to control the resistance as your hand returns to your side. Repeat __________ times. Complete this exercise __________ times per day.  STRENGTH - Shoulder Flexion   Stand or sit with good posture. Grasp a __________ weight, or an exercise band or tubing, so that your hand is "thumbs-up," like when you shake hands.  Slowly lift your right / left arm as far as you can without increasing any shoulder pain. Initially, many people can only raise their hand to shoulder height.  Avoid shrugging your right / left shoulder as your arm rises, by keeping your shoulder blade tucked down and toward your mid-back spine.  Hold for __________ seconds. Control the descent of your hand as you slowly return to your starting position. Repeat __________ times. Complete this exercise __________ times per day.  STRENGTH - Forearm Supinators   Sit with your right / left forearm supported on a table, keeping your elbow below shoulder height. Rest your hand over the edge, palm down.  Gently grip a hammer or a soup ladle.  Without moving your elbow, slowly turn your palm and hand upward to a "thumbs-up" position.  Hold  this position for __________ seconds. Slowly return to the starting position. Repeat __________ times. Complete this exercise __________ times per day.  STRENGTH - Forearm Pronators   Sit with your right / left forearm supported on a table, keeping your elbow below shoulder height. Rest your hand over the edge, palm up.  Gently grip a hammer or a soup ladle.  Without moving your elbow, slowly turn your palm and hand upward to a "thumbs-up" position.  Hold this position for __________ seconds. Slowly return to the starting position. Repeat __________ times. Complete this exercise __________ times per  day.    This information is not intended to replace advice given to you by your health care provider. Make sure you discuss any questions you have with your health care provider.   Document Released: 04/08/2005 Document Revised: 04/29/2014 Document Reviewed: 07/21/2008 Elsevier Interactive Patient Education [employer].

## 2015-03-13 NOTE — Progress Notes (Signed)
Subjective:    Patient ID: Kristin [name], female    DOB: Feb 12, 1941, 74 y.o.   MRN: [id number]  HPI   Kristin [name] is here in follow up of her left MCA infarct. She hasn't had a lot of change in her left arm pain since I last saw her. Her daughter thinks that it might be getting slowly better. She had pain her right shoulder and knee a few weeks back which seemed to dissipate with a hydrocodone she received from her husband.   She maintains her basic exercise and walking around the house. The adjustments to her AFO were helpful in controlling her knee recurvatum. Her mood has been good. Her sleep has improved.    Pain Inventory Average Pain 2 Pain Right Now 0 My pain is intermittent, dull, tingling and aching  In the last 24 hours, has pain interfered with the following? General activity 5 Relation with others 4 Enjoyment of life 10 What TIME of day is your pain at its worst? night Sleep (in general) Poor  Pain is worse with: unsure Pain improves with: medication Relief from Meds: 5  Mobility use a walker use a wheelchair  Function disabled: date disabled 04/2002 I need assistance with the following:  meal prep, household duties and shopping  Neuro/Psych bladder control problems weakness trouble walking spasms  Prior Studies Any changes since last visit?  no  Physicians involved in your care Any changes since last visit?  no   History reviewed. No pertinent family history. Social History   Social History  . Marital Status: Married    Spouse Name: N/A  . Number of Children: N/A  . Years of Education: N/A   Social History Main Topics  . Smoking status: Never Smoker   . Smokeless tobacco: Never Used  . Alcohol Use: 0.0 oz/week    0 Standard drinks or equivalent per week     Comment: glas of wine a day  . Drug Use: No  . Sexual Activity: Not Asked   Other Topics Concern  . None   Social History Narrative   Past Surgical History  Procedure Laterality Date   . Abdominal hysterectomy    . Carotid stent    . Lasix    . Cataracts    . Retina injection    . Esophagogastroduodenoscopy    . Coronary artery bypass graft N/A 12/17/2013    Procedure: CORONARY ARTERY BYPASS GRAFTING (CABG);  Surgeon: Gaye Pollack, MD;  Location: [facility];  Service: Open Heart Surgery;  Laterality: N/A;  Times 2 using left internal mammary artery and endoscopically harvested right saphenous vein  . Intraoperative transesophageal echocardiogram N/A 12/17/2013    Procedure: INTRAOPERATIVE TRANSESOPHAGEAL ECHOCARDIOGRAM;  Surgeon: Gaye Pollack, MD;  Location: [facility] OR;  Service: Open Heart Surgery;  Laterality: N/A;  . Left heart catheterization with coronary angiogram N/A 12/13/2013    Procedure: LEFT HEART CATHETERIZATION WITH CORONARY ANGIOGRAM;  Surgeon: Jettie Booze, MD;  Location: [facility] CATH LAB;  Service: Cardiovascular;  Laterality: N/A;   Past Medical History  Diagnosis Date  . Stroke [facility]) 2001    reesultant Rt spastic hemplegia and aphasia   . Essential hypertension   . CAD (coronary artery disease)     a. 2010: stents pRCA and dRCA. b. LHC 12/13/13: Minimal distal LM dz. LAD 95% ostial w/ L-->R collat. LCx mild dz. RCA 95% mid-dRCA. Patent stents proximal and distal RCA. EF not assessed, Nl by nuclear study.     Marland Kitchen  Ear pain     right  . Hyperlipidemia with target LDL less than 70   . Central retinal vein occlusion, right eye   . Diplopia   . Eosinophilic esophagitis   . Incontinence     urine  . GERD (gastroesophageal reflux disease)   . Arthritis     hands   BP 107/46 mmHg  Pulse 60  SpO2 96%  Opioid Risk Score:   Fall Risk Score:  `1  Depression screen PHQ 2/9  Depression screen PHQ 2/9 11/11/2014  Decreased Interest 0  Down, Depressed, Hopeless 0  PHQ - 2 Score 0      Review of Systems  Musculoskeletal: Positive for gait problem.  Neurological: Positive for weakness.  All other systems reviewed and are negative.      Objective:    Physical Exam  General: Alert , No apparent distress  HEENT: Head is normocephalic, atraumatic, PERRLA, EOMI, sclera anicteric, oral mucosa pink and moist, dentition intact, ext ear canals clear,  Neck: Supple without JVD or lymphadenopathy  Heart: Reg rate and rhythm. No murmurs rubs or gallops  Chest: CTA bilaterally without wheezes, rales, or rhonchi; no distress  Abdomen: Soft, non-tender, non-distended, bowel sounds positive.  Extremities: No clubbing, cyanosis, or edema. Pulses are 2+  Skin: intact  Neuro: Continued aphasia. Is better with automatic phrases. I think her receptive language skills have improved a bit since I last saw her.. Has dense HP of RUE but with 1+ Bicep, pecs, deltoid now. Has 2-3/5 strength in the proximal RLE with AFO in place for distal control. She has mild resting tone in the right hand and wrist. She does have diminished pain sense on the right side but IS able to sense pain. She ambulated with less circumduction, recurvatum improved also.  Musculoskeletal: she has minimal lateral joint line pain at the right knee with palpation.  Right shoulder is less tender w with impingement maneuver but still present. She has an almost 1 cm sublux as well of the right humerus from the glenoid fossa. Harness is appropriately fitting today. She has tenderness with ir/er of left shoulder but remains improved compared to before injection. Left biceps long head tendon appears the most tender. Psych: Pt's affect is appropriate. Pt is cooperative  Assessment & Plan:   1. Hx of left MCA infarct with spastic right hemiparesis, expressive greater than receptive aphasia  2. Chronic right knee pain which appears osteoarthritic and is related to her gait pattern.  3. Chronic low back pain, again, likely related to gait pattern. --improved  4. Insomnia, anxiety  5. Constipation 6. Right shoulder pain related to chronic subluxation, mild adhesive capsulitis, chronic spasticity  7. OSA.    8. Left rotator cuff tendonitis/bursitis;biciptial tendonitis on exam today 9. Right TMJ?    Plan:  1. May continue with tylenol for breakthrough pain  2. Left shoulder stable to improved. Ice, pacing.  3. Continue right shoulder harness.   4. Pacing and preservation for LUE. Provided her basic shoulder exercises. 5. Continue use of voltaren gel for the both the knee and the shoulder  6. I will see her back in 6 months. 30 minutes of face to face patient care time were spent during this visit. All questions were encouraged and answered.

## 2015-03-30 DIAGNOSIS — B351 Tinea unguium: Secondary | ICD-10-CM | POA: Diagnosis not present

## 2015-03-30 DIAGNOSIS — L84 Corns and callosities: Secondary | ICD-10-CM | POA: Diagnosis not present

## 2015-04-10 DIAGNOSIS — M62441 Contracture of muscle, right hand: Secondary | ICD-10-CM | POA: Diagnosis not present

## 2015-05-10 ENCOUNTER — Encounter (INDEPENDENT_AMBULATORY_CARE_PROVIDER_SITE_OTHER): Payer: Medicare PPO | Admitting: Ophthalmology

## 2015-06-08 ENCOUNTER — Other Ambulatory Visit: Payer: Self-pay | Admitting: Internal Medicine

## 2015-06-08 DIAGNOSIS — R1310 Dysphagia, unspecified: Secondary | ICD-10-CM

## 2015-06-13 ENCOUNTER — Inpatient Hospital Stay: Admission: RE | Admit: 2015-06-13 | Payer: Medicare PPO | Source: Ambulatory Visit

## 2015-06-28 ENCOUNTER — Encounter (INDEPENDENT_AMBULATORY_CARE_PROVIDER_SITE_OTHER): Payer: Medicare Other | Admitting: Ophthalmology

## 2015-06-28 DIAGNOSIS — H35033 Hypertensive retinopathy, bilateral: Secondary | ICD-10-CM

## 2015-06-28 DIAGNOSIS — H348121 Central retinal vein occlusion, left eye, with retinal neovascularization: Secondary | ICD-10-CM

## 2015-06-28 DIAGNOSIS — H43813 Vitreous degeneration, bilateral: Secondary | ICD-10-CM

## 2015-06-28 DIAGNOSIS — I10 Essential (primary) hypertension: Secondary | ICD-10-CM | POA: Diagnosis not present

## 2015-07-11 ENCOUNTER — Encounter: Payer: Self-pay | Admitting: Gastroenterology

## 2015-07-19 ENCOUNTER — Ambulatory Visit (INDEPENDENT_AMBULATORY_CARE_PROVIDER_SITE_OTHER): Payer: Medicare Other

## 2015-07-19 ENCOUNTER — Ambulatory Visit (INDEPENDENT_AMBULATORY_CARE_PROVIDER_SITE_OTHER): Payer: Medicare Other | Admitting: Podiatry

## 2015-07-19 ENCOUNTER — Encounter: Payer: Self-pay | Admitting: Podiatry

## 2015-07-19 VITALS — BP 134/78 | HR 74 | Resp 16

## 2015-07-19 DIAGNOSIS — M79671 Pain in right foot: Secondary | ICD-10-CM

## 2015-07-19 DIAGNOSIS — M2041 Other hammer toe(s) (acquired), right foot: Secondary | ICD-10-CM

## 2015-07-19 DIAGNOSIS — L84 Corns and callosities: Secondary | ICD-10-CM | POA: Diagnosis not present

## 2015-07-19 DIAGNOSIS — I999 Unspecified disorder of circulatory system: Secondary | ICD-10-CM | POA: Diagnosis not present

## 2015-07-19 NOTE — Progress Notes (Signed)
   Subjective:    Patient ID: Kristin [name], female    DOB: [birth year], 75 y.o.   MRN: [id number]  HPI Pt presents with right foot pain in 2nd met with hammertoe deformity. She also c/o leg pain with h/o stroke and paralysis on the left side   Review of Systems  Cardiovascular: Positive for leg swelling.  Musculoskeletal: Positive for arthralgias and gait problem.  All other systems reviewed and are negative.      Objective:   Physical Exam        Assessment & Plan:

## 2015-07-19 NOTE — Progress Notes (Signed)
Subjective:     Patient ID: Kristin [name], female   DOB: [birth year], 75 y.o.   MRN: [id number]  HPI patient presents with her husband difficult to communicate with with history of pain in her right leg of several weeks' duration with a corn callus formation distal second toe which is very painful   Review of Systems  All other systems reviewed and are negative.      Objective:   Physical Exam  Musculoskeletal: Normal range of motion.  Skin: Skin is dry.  Nursing note and vitals reviewed.  I noted the patient to have diminished pulses PT DP on the right side and has a keratotic lesion distal side aspect second toe right that's painful. She has had a stroke and does use a wheelchair and is with her husband today who tries to communicate for her. She states that she has pain occurring along her entire right leg and her toe also is very sore     Assessment:     Difficult to make complete determination but cannot rule out vascular causes part of her condition and we are sending her off for evaluation. Hammertoe deformity is present    Plan:     Lesion secondary to pressure second digit right. Debridement of lesion was accomplished today and I advised on the vascular studies which were scheduled and if any breakdown of tissue should occur or other issues to let us know immediately

## 2015-07-26 ENCOUNTER — Encounter (HOSPITAL_COMMUNITY): Payer: Self-pay | Admitting: Podiatry

## 2015-07-27 ENCOUNTER — Telehealth: Payer: Self-pay | Admitting: *Deleted

## 2015-07-27 NOTE — Telephone Encounter (Signed)
[place] - Pt refuses vascular studies.

## 2015-08-14 ENCOUNTER — Encounter: Payer: Self-pay | Admitting: Physical Medicine & Rehabilitation

## 2015-08-14 ENCOUNTER — Encounter: Payer: Medicare Other | Attending: Physical Medicine & Rehabilitation | Admitting: Physical Medicine & Rehabilitation

## 2015-08-14 VITALS — BP 117/76 | HR 58 | Resp 14

## 2015-08-14 DIAGNOSIS — M79604 Pain in right leg: Secondary | ICD-10-CM | POA: Insufficient documentation

## 2015-08-14 DIAGNOSIS — G4733 Obstructive sleep apnea (adult) (pediatric): Secondary | ICD-10-CM | POA: Diagnosis not present

## 2015-08-14 DIAGNOSIS — G8110 Spastic hemiplegia affecting unspecified side: Secondary | ICD-10-CM | POA: Diagnosis not present

## 2015-08-14 DIAGNOSIS — I69820 Aphasia following other cerebrovascular disease: Secondary | ICD-10-CM | POA: Diagnosis not present

## 2015-08-14 DIAGNOSIS — F419 Anxiety disorder, unspecified: Secondary | ICD-10-CM | POA: Diagnosis not present

## 2015-08-14 DIAGNOSIS — M19042 Primary osteoarthritis, left hand: Secondary | ICD-10-CM | POA: Diagnosis not present

## 2015-08-14 DIAGNOSIS — M25511 Pain in right shoulder: Secondary | ICD-10-CM | POA: Diagnosis not present

## 2015-08-14 DIAGNOSIS — R531 Weakness: Secondary | ICD-10-CM | POA: Insufficient documentation

## 2015-08-14 DIAGNOSIS — Z951 Presence of aortocoronary bypass graft: Secondary | ICD-10-CM | POA: Diagnosis not present

## 2015-08-14 DIAGNOSIS — I10 Essential (primary) hypertension: Secondary | ICD-10-CM | POA: Diagnosis not present

## 2015-08-14 DIAGNOSIS — M7581 Other shoulder lesions, right shoulder: Secondary | ICD-10-CM | POA: Diagnosis not present

## 2015-08-14 DIAGNOSIS — M1711 Unilateral primary osteoarthritis, right knee: Secondary | ICD-10-CM | POA: Insufficient documentation

## 2015-08-14 DIAGNOSIS — M545 Low back pain: Secondary | ICD-10-CM | POA: Insufficient documentation

## 2015-08-14 DIAGNOSIS — I2510 Atherosclerotic heart disease of native coronary artery without angina pectoris: Secondary | ICD-10-CM | POA: Insufficient documentation

## 2015-08-14 DIAGNOSIS — M19041 Primary osteoarthritis, right hand: Secondary | ICD-10-CM | POA: Diagnosis not present

## 2015-08-14 DIAGNOSIS — M7552 Bursitis of left shoulder: Secondary | ICD-10-CM | POA: Diagnosis not present

## 2015-08-14 DIAGNOSIS — G8929 Other chronic pain: Secondary | ICD-10-CM | POA: Insufficient documentation

## 2015-08-14 DIAGNOSIS — K5900 Constipation, unspecified: Secondary | ICD-10-CM | POA: Insufficient documentation

## 2015-08-14 DIAGNOSIS — G8111 Spastic hemiplegia affecting right dominant side: Secondary | ICD-10-CM | POA: Diagnosis not present

## 2015-08-14 DIAGNOSIS — K219 Gastro-esophageal reflux disease without esophagitis: Secondary | ICD-10-CM | POA: Diagnosis not present

## 2015-08-14 DIAGNOSIS — E785 Hyperlipidemia, unspecified: Secondary | ICD-10-CM | POA: Diagnosis not present

## 2015-08-14 DIAGNOSIS — M79671 Pain in right foot: Secondary | ICD-10-CM | POA: Diagnosis present

## 2015-08-14 DIAGNOSIS — M7989 Other specified soft tissue disorders: Secondary | ICD-10-CM | POA: Diagnosis not present

## 2015-08-14 DIAGNOSIS — I69351 Hemiplegia and hemiparesis following cerebral infarction affecting right dominant side: Secondary | ICD-10-CM | POA: Insufficient documentation

## 2015-08-14 DIAGNOSIS — N3281 Overactive bladder: Secondary | ICD-10-CM | POA: Insufficient documentation

## 2015-08-14 DIAGNOSIS — G4700 Insomnia, unspecified: Secondary | ICD-10-CM | POA: Insufficient documentation

## 2015-08-14 DIAGNOSIS — M25561 Pain in right knee: Secondary | ICD-10-CM | POA: Diagnosis not present

## 2015-08-14 NOTE — Patient Instructions (Signed)
  PLEASE CALL ME WITH ANY PROBLEMS OR QUESTIONS (#[phone]).      

## 2015-08-14 NOTE — Progress Notes (Signed)
Subjective:    Patient ID: Kristin [name], female    DOB: Oct 07, 1940, 75 y.o.   MRN: [id number]  HPI   Kristin [name] is here in follow up of her left CVA. She has had increased "right sided" pain which started in her right foot about a month ago. It seems to have traveled upward since then. It no encompasses her entire right side. They saw their podiatrist who recommended that she see her cardiologist. The leg has been more swollen. It seems more sever at the ankle in general. No changes in her medications are reported. She is undergoing tibial nerve stimulation per urology for her overactive bladder.     Pain Inventory Average Pain 6 Pain Right Now 6 My pain is constant, dull and aching  In the last 24 hours, has pain interfered with the following? General activity 8 Relation with others 8 Enjoyment of life 8 What TIME of day is your pain at its worst? night Sleep (in general) Poor  Pain is worse with: walking, sitting and standing Pain improves with: heat/ice, therapy/exercise and medication Relief from Meds: 5  Mobility walk with assistance use a walker ability to climb steps?  no do you drive?  no use a wheelchair transfers alone  Function disabled: date disabled 04/1999  Neuro/Psych bladder control problems weakness trouble walking  Prior Studies Any changes since last visit?  no  Physicians involved in your care Any changes since last visit?  no   History reviewed. No pertinent family history. Social History   Social History  . Marital Status: Married    Spouse Name: N/A  . Number of Children: N/A  . Years of Education: N/A   Social History Main Topics  . Smoking status: Never Smoker   . Smokeless tobacco: Never Used  . Alcohol Use: 0.0 oz/week    0 Standard drinks or equivalent per week     Comment: glas of wine a day  . Drug Use: No  . Sexual Activity: Not Asked   Other Topics Concern  . None   Social History Narrative   Past Surgical History    Procedure Laterality Date  . Abdominal hysterectomy    . Carotid stent    . Lasix    . Cataracts    . Retina injection    . Esophagogastroduodenoscopy    . Coronary artery bypass graft N/A 12/17/2013    Procedure: CORONARY ARTERY BYPASS GRAFTING (CABG);  Surgeon: Gaye Pollack, MD;  Location: [place];  Service: Open Heart Surgery;  Laterality: N/A;  Times 2 using left internal mammary artery and endoscopically harvested right saphenous vein  . Intraoperative transesophageal echocardiogram N/A 12/17/2013    Procedure: INTRAOPERATIVE TRANSESOPHAGEAL ECHOCARDIOGRAM;  Surgeon: Gaye Pollack, MD;  Location: [facility] OR;  Service: Open Heart Surgery;  Laterality: N/A;  . Left heart catheterization with coronary angiogram N/A 12/13/2013    Procedure: LEFT HEART CATHETERIZATION WITH CORONARY ANGIOGRAM;  Surgeon: Jettie Booze, MD;  Location: [facility] CATH LAB;  Service: Cardiovascular;  Laterality: N/A;   Past Medical History  Diagnosis Date  . Stroke [facility]) 2001    reesultant Rt spastic hemplegia and aphasia   . Essential hypertension   . CAD (coronary artery disease)     a. 2010: stents pRCA and dRCA. b. LHC 12/13/13: Minimal distal LM dz. LAD 95% ostial w/ L-->R collat. LCx mild dz. RCA 95% mid-dRCA. Patent stents proximal and distal RCA. EF not assessed, Nl by nuclear study.     Marland Kitchen  Ear pain     right  . Hyperlipidemia with target LDL less than 70   . Central retinal vein occlusion, right eye   . Diplopia   . Eosinophilic esophagitis   . Incontinence     urine  . GERD (gastroesophageal reflux disease)   . Arthritis     hands   BP 117/76 mmHg  Pulse 58  Resp 14  SpO2 95%  Opioid Risk Score:   Fall Risk Score:  `1  Depression screen PHQ 2/9  Depression screen PHQ 2/9 11/11/2014  Decreased Interest 0  Down, Depressed, Hopeless 0  PHQ - 2 Score 0     Review of Systems  All other systems reviewed and are negative.      Objective:   Physical Exam  General: Alert , No apparent  distress  HEENT: Head is normocephalic, atraumatic, PERRLA, EOMI, sclera anicteric, oral mucosa pink and moist, dentition intact, ext ear canals clear,  Neck: Supple without JVD or lymphadenopathy  Heart: Reg rate and rhythm. No murmurs rubs or gallops  Chest: CTA bilaterally without wheezes, rales, or rhonchi; no distress  Abdomen: Soft, non-tender, non-distended, bowel sounds positive.  Extremities: No clubbing, cyanosis, or edema. Pulses are 2+  Skin: intact  Neuro: Continued aphasia. Is better with automatic phrases. I think her receptive language skills have improved a bit since I last saw her.. Has dense HP of RUE but with 1+ Bicep, pecs, deltoid now. Has 2-3/5 strength in the proximal RLE with AFO in place for distal control. She has mild resting tone in the right hand and wrist. She does have diminished pain sense on the right side but IS able to sense pain. She ambulated with less circumduction, recurvatum improved also.  Musculoskeletal: she has minimal lateral joint line pain at the right knee with palpation. Right shoulder is tender w with impingement maneuver  . She has an almost 1 cm sublux as well of the right humerus from the glenoid fossa. Harness is appropriately fitting today. She has tenderness with ir/er of left shoulder but remains improved compared to before injection. Left biceps long head tendon appears the most tender. RLE with 1+ 2+ edema with positive homan's sign. Calf itself slightly tender also. No warmth or cords palpated Psych: Pt's affect is appropriate. Pt is cooperative   Assessment & Plan:   1. Hx of left MCA infarct with spastic right hemiparesis, expressive greater than receptive aphasia  2. Chronic right knee pain which appears osteoarthritic and is related to her gait pattern.  3. Chronic low back pain, again, likely related to gait pattern. --improved  4. Insomnia, anxiety  5. Constipation 6. Right shoulder pain related to chronic subluxation, mild adhesive  capsulitis, chronic spasticity  7. OSA.  8. Left rotator cuff tendonitis/bursitis;biciptial tendonitis on exam today  9. Right rotator cuff tendonitis/right shoulder sublux 10. RLE pain and swelling. ?DVT    Plan:  1. Referred for venous U/S RLE. This simply may be two separate processes happening simultaneously. Exam is most suspicious for DVT. 2. After informed consent and preparation of the skin with betadine and isopropyl alcohol, I injected 6mg  (1cc) of celestone and 4cc of 1% lidocaine into the right shoulder via lateral approach. Additionally, aspiration was performed prior to injection. The patient tolerated well, and no complications were encountered. Afterward the area was cleaned and dressed. Post- injection instructions were provided.    3. Continue right shoulder harness for support 4. Pacing and preservation for LUE. Provided her basic  shoulder exercises.  5. Continue use of voltaren gel for the both the knee and the shoulder  6. I will see her back in 3 months. 30 minutes of face to face patient care time were spent during this visit. All questions were encouraged and answered.

## 2015-08-18 ENCOUNTER — Ambulatory Visit (HOSPITAL_COMMUNITY)
Admission: RE | Admit: 2015-08-18 | Discharge: 2015-08-18 | Disposition: A | Discharge status: Home / Self Care | Payer: Medicare Other | Source: Ambulatory Visit | Attending: Vascular Surgery | Admitting: Vascular Surgery

## 2015-08-18 DIAGNOSIS — E785 Hyperlipidemia, unspecified: Secondary | ICD-10-CM | POA: Insufficient documentation

## 2015-08-18 DIAGNOSIS — K219 Gastro-esophageal reflux disease without esophagitis: Secondary | ICD-10-CM | POA: Insufficient documentation

## 2015-08-18 DIAGNOSIS — G8110 Spastic hemiplegia affecting unspecified side: Secondary | ICD-10-CM

## 2015-08-18 DIAGNOSIS — M7989 Other specified soft tissue disorders: Secondary | ICD-10-CM | POA: Diagnosis not present

## 2015-08-18 DIAGNOSIS — I2510 Atherosclerotic heart disease of native coronary artery without angina pectoris: Secondary | ICD-10-CM | POA: Insufficient documentation

## 2015-08-18 DIAGNOSIS — I10 Essential (primary) hypertension: Secondary | ICD-10-CM | POA: Insufficient documentation

## 2015-08-27 ENCOUNTER — Encounter: Payer: Self-pay | Admitting: Physical Medicine & Rehabilitation

## 2015-09-04 ENCOUNTER — Encounter: Payer: Self-pay | Admitting: Gastroenterology

## 2015-09-04 ENCOUNTER — Ambulatory Visit (INDEPENDENT_AMBULATORY_CARE_PROVIDER_SITE_OTHER): Payer: Medicare Other | Admitting: Gastroenterology

## 2015-09-04 ENCOUNTER — Other Ambulatory Visit (INDEPENDENT_AMBULATORY_CARE_PROVIDER_SITE_OTHER): Payer: Medicare Other

## 2015-09-04 ENCOUNTER — Ambulatory Visit: Payer: Medicare PPO | Admitting: Physical Medicine & Rehabilitation

## 2015-09-04 VITALS — BP 106/64 | HR 68 | Ht 66.0 in | Wt 186.2 lb

## 2015-09-04 DIAGNOSIS — K625 Hemorrhage of anus and rectum: Secondary | ICD-10-CM

## 2015-09-04 DIAGNOSIS — D649 Anemia, unspecified: Secondary | ICD-10-CM

## 2015-09-04 DIAGNOSIS — Z1212 Encounter for screening for malignant neoplasm of rectum: Secondary | ICD-10-CM | POA: Diagnosis not present

## 2015-09-04 DIAGNOSIS — Z1211 Encounter for screening for malignant neoplasm of colon: Secondary | ICD-10-CM | POA: Diagnosis not present

## 2015-09-04 LAB — CBC WITH DIFFERENTIAL/PLATELET
Basophils Absolute: 0 10*3/uL (ref 0.0–0.1)
Basophils Relative: 0.7 % (ref 0.0–3.0)
Eosinophils Absolute: 0.3 10*3/uL (ref 0.0–0.7)
Eosinophils Relative: 4.3 % (ref 0.0–5.0)
HCT: 35.9 % — ABNORMAL LOW (ref 36.0–46.0)
Hemoglobin: 11.5 g/dL — ABNORMAL LOW (ref 12.0–15.0)
Lymphocytes Relative: 17.7 % (ref 12.0–46.0)
Lymphs Abs: 1 10*3/uL (ref 0.7–4.0)
MCHC: 32 g/dL (ref 30.0–36.0)
MCV: 70.2 fl — ABNORMAL LOW (ref 78.0–100.0)
Monocytes Absolute: 0.5 10*3/uL (ref 0.1–1.0)
Monocytes Relative: 8.7 % (ref 3.0–12.0)
Neutro Abs: 4 10*3/uL (ref 1.4–7.7)
Neutrophils Relative %: 68.6 % (ref 43.0–77.0)
Platelets: 239 10*3/uL (ref 150.0–400.0)
RBC: 5.11 Mil/uL (ref 3.87–5.11)
RDW: 19.6 % — ABNORMAL HIGH (ref 11.5–15.5)
WBC: 5.8 10*3/uL (ref 4.0–10.5)

## 2015-09-04 LAB — IBC PANEL
Iron: 31 ug/dL — ABNORMAL LOW (ref 42–145)
Saturation Ratios: 6.3 % — ABNORMAL LOW (ref 20.0–50.0)
Transferrin: 349 mg/dL (ref 212.0–360.0)

## 2015-09-04 LAB — RETICULOCYTES
ABS Retic: 40320 cells/uL (ref 20000–80000)
RBC.: 5.04 MIL/uL (ref 3.80–5.10)
Retic Ct Pct: 0.8 %

## 2015-09-04 LAB — FERRITIN: Ferritin: 7.3 ng/mL — ABNORMAL LOW (ref 10.0–291.0)

## 2015-09-04 LAB — B12 AND FOLATE PANEL
Folate: >23.3 ng/mL (ref >5.9)
Vitamin B-12: 254 pg/mL (ref 211–911)

## 2015-09-04 NOTE — Progress Notes (Signed)
HPI :  75 y/o female: here for a new patient evaluation for microcytic anemia. History of left sided MCA CVA 2001 on plavix and CAD, s/p PTCA in 2011 with stent placement, sp CABG in 2015. Also with reported history of eosinophilic esophagitis. Here with her daughter today.   Labs from 06/08/2015 as below, shows mild microcytic anemia. She has some red blood in the stools which she endorses intermittently. No rectal discomfort associated with this. She is not sure how long this has gone on for. She denies constipation or diarrhea. No significant or chronic abdominal pains. She is eating well. No weight loss. No reported known iron deficiency. No prior colonoscopy. She reportedly has a history of EoE for which she has had an EGD years ago for history of dysphagia. She does have some dysphagia, at times which is intermittent and not significantly bothersome. Dysphagia has been intermittent over the past few years, unclear duration. No FH of colon cancer. No other new symptoms that she endorses. She lives with husband and has poor ability to ambulate without assistance, she is concerned about the difficulty of doing bowel preparation at home without assistance if she needs a colonoscopy.    Labs 06/08/2015 -  CBC - WBC 5.4, Hgb 11.7, MCV 71, platelet 214 LFTS and BMP normal     Past Medical History  Diagnosis Date  . Stroke [facility]) 2001    reesultant Rt spastic hemplegia and aphasia   . Essential hypertension   . CAD (coronary artery disease)     a. 2010: stents pRCA and dRCA. b. LHC 12/13/13: Minimal distal LM dz. LAD 95% ostial w/ L-->R collat. LCx mild dz. RCA 95% mid-dRCA. Patent stents proximal and distal RCA. EF not assessed, Nl by nuclear study.     . Ear pain     right  . Hyperlipidemia with target LDL less than 70   . Central retinal vein occlusion, right eye   . Diplopia   . Eosinophilic esophagitis   . Incontinence     urine  . GERD (gastroesophageal reflux disease)   . Arthritis       hands  . Depression   . H/O: whooping cough      Past Surgical History  Procedure Laterality Date  . Abdominal hysterectomy    . Carotid stent    . Lasix    . Cataracts    . Retina injection    . Esophagogastroduodenoscopy    . Coronary artery bypass graft N/A 12/17/2013    Procedure: CORONARY ARTERY BYPASS GRAFTING (CABG);  Surgeon: Gaye Pollack, MD;  Location: [place];  Service: Open Heart Surgery;  Laterality: N/A;  Times 2 using left internal mammary artery and endoscopically harvested right saphenous vein  . Intraoperative transesophageal echocardiogram N/A 12/17/2013    Procedure: INTRAOPERATIVE TRANSESOPHAGEAL ECHOCARDIOGRAM;  Surgeon: Gaye Pollack, MD;  Location: [facility] OR;  Service: Open Heart Surgery;  Laterality: N/A;  . Left heart catheterization with coronary angiogram N/A 12/13/2013    Procedure: LEFT HEART CATHETERIZATION WITH CORONARY ANGIOGRAM;  Surgeon: Jettie Booze, MD;  Location: [facility] CATH LAB;  Service: Cardiovascular;  Laterality: N/A;   Family History  Problem Relation Age of Onset  . Diabetes Mother   . Heart disease Mother   . Heart attack Father   . Breast cancer Sister    Social History  Substance Use Topics  . Smoking status: Never Smoker   . Smokeless tobacco: Never Used  . Alcohol Use: 0.0 oz/week  0 Standard drinks or equivalent per week     Comment: glass of wine a day   Current Outpatient Prescriptions  Medication Sig Dispense Refill  . aspirin 81 MG tablet Take 81 mg by mouth daily.    Marland Kitchen atenolol (TENORMIN) 25 MG tablet Take 12.5 mg by mouth 2 (two) times daily.    Marland Kitchen atorvastatin (LIPITOR) 40 MG tablet Take 80 mg by mouth daily at 6 PM.     . clopidogrel (PLAVIX) 75 MG tablet Take 75 mg by mouth daily.    . diclofenac sodium (VOLTAREN) 1 % GEL Apply 4 g topically 4 (four) times daily as needed (pain).     Marland Kitchen omeprazole (PRILOSEC) 20 MG capsule Take 20 mg by mouth daily.    Marland Kitchen oxybutynin (DITROPAN) 5 MG tablet Take 5 mg by mouth daily.     . pregabalin (LYRICA) 150 MG capsule Take 150 mg by mouth 2 (two) times daily.     . sertraline (ZOLOFT) 50 MG tablet Take 75 mg by mouth every evening.     . trimethoprim (TRIMPEX) 100 MG tablet Take 100 mg by mouth daily.     No current facility-administered medications for this visit.   No Known Allergies   Review of Systems: All systems reviewed and negative except where noted in HPI.   CBC Latest Ref Rng 12/20/2013 12/19/2013 12/18/2013  WBC 4.0 - 10.5 K/uL 8.2 6.5 -  Hemoglobin 12.0 - 15.0 g/dL 11.5(L) 10.1(L) 11.2(L)  Hematocrit 36.0 - 46.0 % 35.8(L) 31.1(L) 33.0(L)  Platelets 150 - 400 K/uL 178 161 -     Lab Results  Component Value Date   CREATININE 0.62 12/20/2013   BUN 13 12/20/2013   NA 142 12/20/2013   K 3.8 12/20/2013   CL 105 12/20/2013   CO2 23 12/20/2013    Lab Results  Component Value Date   ALT 13 12/09/2013   AST 17 12/09/2013   ALKPHOS 80 12/09/2013   BILITOT 0.7 12/09/2013     Physical Exam: BP 106/64 mmHg  Pulse 68  Ht 5\' 6"  (1.676 m)  Wt 186 lb 4 oz (84.482 kg)  BMI 30.08 kg/m2 Constitutional: Pleasant,well-developed, female in no acute distress. Uses walker for ambulation HEENT: Normocephalic and atraumatic. Conjunctivae are normal. No scleral icterus. Neck supple.  Cardiovascular: Normal rate, regular rhythm.  Pulmonary/chest: Effort normal and breath sounds normal. No wheezing, rales or rhonchi. Abdominal: Soft, nondistended, nontender. Bowel sounds active throughout. There are no masses palpable.  DRE - no mass lesion, external hemorrhoids, fissure in posterior midline anal canal Extremities: Trace LE edema Lymphadenopathy: No cervical adenopathy noted. Neurological: Alert and oriented, can answer yes/no questions. Aphasic otherwise with R sided hemiplegia Skin: Skin is warm and dry. No rashes noted. Psychiatric: Normal mood and affect. Behavior is normal.   ASSESSMENT AND PLAN: 75 y/o pleasant female with history of CVA and CAD  as above, on plavix, presenting with rectal bleeding and microcytic anemia.   Anemia - mild and microcytic, although appears to be noted on remote labs but this was in the setting of hospitalization and was previously normocytic. I suspect this likely represents iron deficiency given new microcytosis but iron studies not sent yet. We will send iron studies, B12/folate, reticulocyte count, and repeat CBC today to see if the Hgb has drifted down or not. If she is iron deficient, we discussed etiologies, and in this setting would recommend an EGD and colonoscopy to initially evaluate it. We discussed risks of the sedation and  procedure, as well as the risks of holding the plavix if we needed to proceed with it. In discussion with her family, given her limited ability of the patient to ambulate, doing a bowel preparation at home would be very difficult, and likely need to be done in the hospital setting, with admission to help bowel preparation. I will let them know results of iron studies with further recommendations. They agreed.   Rectal bleeding - anal fissure and external hemorrhoids noted on DRE today, which could be the cause, however given her anemia and lack of colon cancer screening previously, the concern would be to ensure no bleeding polyp / mass lesion. Will await iron studies, if deficient she will proceed with colonoscopy. If iron studies are normal, we discussed how aggressive she wanted to be with regards to further workup for the rectal bleeding, and I offered colonoscopy given she has not had prior screening. We will await lab results first. In the interim, offered topical nitroglycerin for the fissure however she does not think she can apply it at home without help. She is currently not having pain from it. Recommend a daily fiber supplement in this light to use daily and see if this helps. They agreed with the plan.   [place] Cellar, MD [facility] Pager  [phone]  CC: Leanna Battles, MD

## 2015-09-04 NOTE — Patient Instructions (Signed)
If you are age 77 or older, your body mass index should be between 23-30. Your Body mass index is 30.08 kg/(m^2). If this is out of the aforementioned range listed, please consider follow up with your Primary Care Provider.  If you are age 5 or younger, your body mass index should be between 19-25. Your Body mass index is 30.08 kg/(m^2). If this is out of the aformentioned range listed, please consider follow up with your Primary Care Provider.   Your physician has requested that you have lab work before leaving today.  Please start a fiber supplement and take daily as directed.  Thank you for choosing [facility]  Dr [place] Cellar

## 2015-09-08 ENCOUNTER — Encounter: Payer: Self-pay | Admitting: *Deleted

## 2015-09-08 ENCOUNTER — Other Ambulatory Visit: Payer: Self-pay | Admitting: *Deleted

## 2015-09-08 ENCOUNTER — Telehealth: Payer: Self-pay | Admitting: *Deleted

## 2015-09-08 DIAGNOSIS — K625 Hemorrhage of anus and rectum: Secondary | ICD-10-CM

## 2015-09-08 DIAGNOSIS — D509 Iron deficiency anemia, unspecified: Secondary | ICD-10-CM

## 2015-09-08 MED ORDER — FERROUS SULFATE 325 (65 FE) MG PO TABS: 325.0000 mg | ORAL_TABLET | Freq: Every day | ORAL | Status: DC

## 2015-09-08 NOTE — Telephone Encounter (Signed)
09/08/2015   RE: Kristin [name] DOB: Oct 15, 1940 MRN: [id number]   Dear Dr. Philip Aspen,   We have scheduled the above patient for an endoscopic procedure. Our records show that she is on anticoagulation therapy.  Please advise as to whether the patient may hold Plavix 5-7 days prior to the procedure, which is scheduled for 10/05/15.  Please, route your response to Leone Payor, RN.   Sincerely,  Leone Payor, RN

## 2015-09-18 ENCOUNTER — Emergency Department (HOSPITAL_COMMUNITY)
Admission: EM | Admit: 2015-09-18 | Discharge: 2015-09-18 | Disposition: A | Discharge status: Home / Self Care | Payer: Medicare Other | Attending: Emergency Medicine | Admitting: Emergency Medicine

## 2015-09-18 ENCOUNTER — Encounter (HOSPITAL_COMMUNITY): Payer: Self-pay | Admitting: Emergency Medicine

## 2015-09-18 DIAGNOSIS — Z792 Long term (current) use of antibiotics: Secondary | ICD-10-CM | POA: Insufficient documentation

## 2015-09-18 DIAGNOSIS — Z8619 Personal history of other infectious and parasitic diseases: Secondary | ICD-10-CM | POA: Diagnosis not present

## 2015-09-18 DIAGNOSIS — F329 Major depressive disorder, single episode, unspecified: Secondary | ICD-10-CM | POA: Diagnosis not present

## 2015-09-18 DIAGNOSIS — Z79899 Other long term (current) drug therapy: Secondary | ICD-10-CM | POA: Insufficient documentation

## 2015-09-18 DIAGNOSIS — I2510 Atherosclerotic heart disease of native coronary artery without angina pectoris: Secondary | ICD-10-CM | POA: Insufficient documentation

## 2015-09-18 DIAGNOSIS — Z951 Presence of aortocoronary bypass graft: Secondary | ICD-10-CM | POA: Diagnosis not present

## 2015-09-18 DIAGNOSIS — M19041 Primary osteoarthritis, right hand: Secondary | ICD-10-CM | POA: Insufficient documentation

## 2015-09-18 DIAGNOSIS — K219 Gastro-esophageal reflux disease without esophagitis: Secondary | ICD-10-CM | POA: Diagnosis not present

## 2015-09-18 DIAGNOSIS — Z8669 Personal history of other diseases of the nervous system and sense organs: Secondary | ICD-10-CM | POA: Diagnosis not present

## 2015-09-18 DIAGNOSIS — E785 Hyperlipidemia, unspecified: Secondary | ICD-10-CM | POA: Insufficient documentation

## 2015-09-18 DIAGNOSIS — Z7982 Long term (current) use of aspirin: Secondary | ICD-10-CM | POA: Insufficient documentation

## 2015-09-18 DIAGNOSIS — Z8673 Personal history of transient ischemic attack (TIA), and cerebral infarction without residual deficits: Secondary | ICD-10-CM | POA: Insufficient documentation

## 2015-09-18 DIAGNOSIS — M19042 Primary osteoarthritis, left hand: Secondary | ICD-10-CM | POA: Diagnosis not present

## 2015-09-18 DIAGNOSIS — R4182 Altered mental status, unspecified: Secondary | ICD-10-CM | POA: Diagnosis not present

## 2015-09-18 DIAGNOSIS — N390 Urinary tract infection, site not specified: Secondary | ICD-10-CM | POA: Diagnosis not present

## 2015-09-18 DIAGNOSIS — R531 Weakness: Secondary | ICD-10-CM | POA: Diagnosis present

## 2015-09-18 DIAGNOSIS — Z7902 Long term (current) use of antithrombotics/antiplatelets: Secondary | ICD-10-CM | POA: Insufficient documentation

## 2015-09-18 DIAGNOSIS — I10 Essential (primary) hypertension: Secondary | ICD-10-CM | POA: Insufficient documentation

## 2015-09-18 LAB — BASIC METABOLIC PANEL
Anion gap: 9 (ref 5–15)
BUN: 17 mg/dL (ref 6–20)
CO2: 22 mmol/L (ref 22–32)
Calcium: 8.5 mg/dL — ABNORMAL LOW (ref 8.9–10.3)
Chloride: 106 mmol/L (ref 101–111)
Creatinine, Ser: 0.88 mg/dL (ref 0.44–1.00)
GFR calc Af Amer: >60 mL/min (ref >60)
GFR calc non Af Amer: >60 mL/min (ref >60)
Glucose, Bld: 117 mg/dL — ABNORMAL HIGH (ref 65–99)
Potassium: 3.6 mmol/L (ref 3.5–5.1)
Sodium: 137 mmol/L (ref 135–145)

## 2015-09-18 LAB — CBC
HCT: 36 % (ref 36.0–46.0)
Hemoglobin: 10.7 g/dL — ABNORMAL LOW (ref 12.0–15.0)
MCH: 22.2 pg — ABNORMAL LOW (ref 26.0–34.0)
MCHC: 29.7 g/dL — ABNORMAL LOW (ref 30.0–36.0)
MCV: 74.5 fL — ABNORMAL LOW (ref 78.0–100.0)
Platelets: 217 10*3/uL (ref 150–400)
RBC: 4.83 MIL/uL (ref 3.87–5.11)
RDW: 18.9 % — ABNORMAL HIGH (ref 11.5–15.5)
WBC: 8 10*3/uL (ref 4.0–10.5)

## 2015-09-18 LAB — CBG MONITORING, ED: Glucose-Capillary: 89 mg/dL (ref 65–99)

## 2015-09-18 LAB — URINALYSIS, ROUTINE W REFLEX MICROSCOPIC
Bilirubin Urine: NEGATIVE
Glucose, UA: NEGATIVE mg/dL
Ketones, ur: NEGATIVE mg/dL
Nitrite: NEGATIVE
Protein, ur: 100 mg/dL — AB
Specific Gravity, Urine: 1.021 (ref 1.005–1.030)
pH: 5.5 (ref 5.0–8.0)

## 2015-09-18 LAB — URINE MICROSCOPIC-ADD ON

## 2015-09-18 MED ORDER — ACETAMINOPHEN 500 MG PO TABS
1000.0000 mg | ORAL_TABLET | Freq: Once | ORAL | Status: AC
  Administered 2015-09-18: 1000 mg via ORAL
  Filled 2015-09-18: qty 2

## 2015-09-18 MED ORDER — CEPHALEXIN 500 MG PO CAPS: 500.0000 mg | ORAL_CAPSULE | Freq: Three times a day (TID) | ORAL | Status: DC

## 2015-09-18 MED ORDER — CEPHALEXIN 250 MG PO CAPS
500.0000 mg | ORAL_CAPSULE | Freq: Once | ORAL | Status: AC
  Administered 2015-09-18: 500 mg via ORAL
  Filled 2015-09-18: qty 2

## 2015-09-18 MED ORDER — SODIUM CHLORIDE 0.9 % IV BOLUS (SEPSIS)
1000.0000 mL | Freq: Once | INTRAVENOUS | Status: AC
Start: 2015-09-18 — End: 2015-09-18
  Administered 2015-09-18: 1000 mL via INTRAVENOUS

## 2015-09-18 NOTE — ED Notes (Signed)
In & Out cath unnecessary as pt able to void using fracture pan.

## 2015-09-18 NOTE — Discharge Instructions (Signed)

## 2015-09-18 NOTE — ED Provider Notes (Signed)
CSN: [id number]     Arrival date & time 09/18/15  2041 History   First MD Initiated Contact with Patient 09/18/15 2129     Chief Complaint  Patient presents with  . Weakness     (Consider location/radiation/quality/duration/timing/severity/associated sxs/prior Treatment) Patient is a 75 y.o. female presenting with altered mental status. The history is provided by a relative.  Altered Mental Status Presenting symptoms: confusion and lethargy   Severity:  Mild Most recent episode:  Today Episode history:  Single Duration:  20 minutes Timing:  Sporadic Progression:  Resolved Chronicity:  New Context comment:  H/o stroke, had fever earlier Associated symptoms: fever and weakness   Associated symptoms: no vomiting   Associated symptoms comment:  Urinary frequency   Past Medical History  Diagnosis Date  . Stroke [facility]) 2001    reesultant Rt spastic hemplegia and aphasia   . Essential hypertension   . CAD (coronary artery disease)     a. 2010: stents pRCA and dRCA. b. LHC 12/13/13: Minimal distal LM dz. LAD 95% ostial w/ L-->R collat. LCx mild dz. RCA 95% mid-dRCA. Patent stents proximal and distal RCA. EF not assessed, Nl by nuclear study.     . Ear pain     right  . Hyperlipidemia with target LDL less than 70   . Central retinal vein occlusion, right eye   . Diplopia   . Eosinophilic esophagitis   . Incontinence     urine  . GERD (gastroesophageal reflux disease)   . Arthritis     hands  . Depression   . H/O: whooping cough    Past Surgical History  Procedure Laterality Date  . Abdominal hysterectomy    . Carotid stent    . Lasix    . Cataracts    . Retina injection    . Esophagogastroduodenoscopy    . Coronary artery bypass graft N/A 12/17/2013    Procedure: CORONARY ARTERY BYPASS GRAFTING (CABG);  Surgeon: Gaye Pollack, MD;  Location: [place];  Service: Open Heart Surgery;  Laterality: N/A;  Times 2 using left internal mammary artery and endoscopically harvested  right saphenous vein  . Intraoperative transesophageal echocardiogram N/A 12/17/2013    Procedure: INTRAOPERATIVE TRANSESOPHAGEAL ECHOCARDIOGRAM;  Surgeon: Gaye Pollack, MD;  Location: [facility] OR;  Service: Open Heart Surgery;  Laterality: N/A;  . Left heart catheterization with coronary angiogram N/A 12/13/2013    Procedure: LEFT HEART CATHETERIZATION WITH CORONARY ANGIOGRAM;  Surgeon: Jettie Booze, MD;  Location: [facility] CATH LAB;  Service: Cardiovascular;  Laterality: N/A;   Family History  Problem Relation Age of Onset  . Diabetes Mother   . Heart disease Mother   . Heart attack Father   . Breast cancer Sister    Social History  Substance Use Topics  . Smoking status: Never Smoker   . Smokeless tobacco: Never Used  . Alcohol Use: 0.0 oz/week    0 Standard drinks or equivalent per week     Comment: glass of wine a day   OB History    No data available     Review of Systems  Constitutional: Positive for fever.  Gastrointestinal: Negative for vomiting.  Neurological: Positive for weakness.  Psychiatric/Behavioral: Positive for confusion.  All other systems reviewed and are negative.     Allergies  Review of patient's allergies indicates no known allergies.  Home Medications   Prior to Admission medications   Medication Sig Start Date End Date Taking? Authorizing Provider  aspirin 81 MG tablet  Take 81 mg by mouth daily.   Yes Historical Provider, MD  atenolol (TENORMIN) 25 MG tablet Take 12.5 mg by mouth 2 (two) times daily.   Yes Historical Provider, MD  atorvastatin (LIPITOR) 40 MG tablet Take 80 mg by mouth daily at 6 PM.    Yes Historical Provider, MD  clopidogrel (PLAVIX) 75 MG tablet Take 75 mg by mouth daily.   Yes Historical Provider, MD  diclofenac sodium (VOLTAREN) 1 % GEL Apply 4 g topically 4 (four) times daily as needed (pain).    Yes Historical Provider, MD  ferrous sulfate 325 (65 FE) MG tablet Take 1 tablet (325 mg total) by mouth daily with breakfast.  09/08/15  Yes Manus Gunning, MD  omeprazole (PRILOSEC) 20 MG capsule Take 20 mg by mouth daily.   Yes Historical Provider, MD  oxybutynin (DITROPAN) 5 MG tablet Take 5 mg by mouth daily.   Yes Historical Provider, MD  pregabalin (LYRICA) 150 MG capsule Take 150 mg by mouth 2 (two) times daily.    Yes Historical Provider, MD  sertraline (ZOLOFT) 50 MG tablet Take 75 mg by mouth every evening.    Yes Historical Provider, MD  trimethoprim (TRIMPEX) 100 MG tablet Take 100 mg by mouth every evening.    Yes Historical Provider, MD   BP 136/74 mmHg  Pulse 72  Temp(Src) 99.1 F (37.3 C) (Oral)  Resp 16  Ht 5\' 6"  (1.676 m)  SpO2 97% Physical Exam  Constitutional: She appears well-developed and well-nourished. No distress.  HENT:  Head: Normocephalic.  Eyes: Conjunctivae are normal.  Neck: Neck supple. No tracheal deviation present.  Cardiovascular: Normal rate, regular rhythm and normal heart sounds.   Pulmonary/Chest: Effort normal and breath sounds normal. No respiratory distress.  Abdominal: Soft. She exhibits no distension. There is no tenderness. There is no rebound and no guarding.  Neurological: She is alert. GCS eye subscore is 4. GCS verbal subscore is 2. GCS motor subscore is 6.  Skin: Skin is warm and dry.  Psychiatric: She has a normal mood and affect.    ED Course  Procedures (including critical care time) Labs Review Labs Reviewed  BASIC METABOLIC PANEL - Abnormal; Notable for the following:    Glucose, Bld 117 (*)    Calcium 8.5 (*)    All other components within normal limits  CBC - Abnormal; Notable for the following:    Hemoglobin 10.7 (*)    MCV 74.5 (*)    MCH 22.2 (*)    MCHC 29.7 (*)    RDW 18.9 (*)    All other components within normal limits  URINALYSIS, ROUTINE W REFLEX MICROSCOPIC (NOT AT [facility]) - Abnormal; Notable for the following:    APPearance TURBID (*)    Hgb urine dipstick LARGE (*)    Protein, ur 100 (*)    Leukocytes, UA LARGE (*)     All other components within normal limits  URINE MICROSCOPIC-ADD ON - Abnormal; Notable for the following:    Squamous Epithelial / LPF 6-30 (*)    Bacteria, UA MANY (*)    All other components within normal limits  URINE CULTURE  CBG MONITORING, ED    Imaging Review No results found. I have personally reviewed and evaluated these images and lab results as part of my medical decision-making.   EKG Interpretation   Date/Time:  Monday Sep 18 2015 21:04:15 EDT Ventricular Rate:  71 PR Interval:  182 QRS Duration: 91 QT Interval:  422 QTC Calculation:  459 R Axis:   -7 Text Interpretation:  Sinus rhythm Low voltage, precordial leads  Nonspecific T abnrm, anterolateral leads No significant change since last  tracing Artifact Confirmed by Tniyah Nakagawa MD, Quillian Quince [id number]) on 09/18/2015  9:50:30 PM      MDM   Final diagnoses:  Urinary tract infection without hematuria, site unspecified   75 y.o. female presents with urinary frequency and new onset confusion and lethargy from baseline. Found to be febrile at home. WBC not elevated, not septic appearing here. Large leuks are concerning for development of UTI which may explain Pt's symptoms. Will treat empirically with keflex pending culture. Given first dose here and plan for 10 day course. Plan to follow up with PCP as needed and return precautions discussed for worsening or new concerning symptoms.     Leo Grosser, MD 09/19/15 [phone]

## 2015-09-18 NOTE — ED Notes (Signed)
Pt departed in wheelchair. NAD.

## 2015-09-18 NOTE — ED Notes (Signed)
Per EMS, coming in for a fever that began a few hrs ago with general weakness. Pt prone to UTIs. Prior hx of CVA with difficulty of speech. Family reports fever of 101.5.

## 2015-09-19 NOTE — Telephone Encounter (Signed)
Spoke with Melissa, Dr. Shon Baton assistant and she confirmed with him that patient can hold Plavix 5-7 days prior to procedures.

## 2015-09-19 NOTE — Telephone Encounter (Signed)
Sent a message to Dr. Carlean Purl re: Plavix recommendation.

## 2015-09-19 NOTE — Telephone Encounter (Signed)
Left a message for Dr. Shon Baton assistant to call back.

## 2015-09-20 LAB — URINE CULTURE

## 2015-10-04 ENCOUNTER — Observation Stay (HOSPITAL_COMMUNITY)
Admission: RE | Admit: 2015-10-04 | Discharge: 2015-10-05 | Disposition: A | Discharge status: Home / Self Care | Payer: Medicare Other | Source: Ambulatory Visit | Attending: Gastroenterology | Admitting: Gastroenterology

## 2015-10-04 ENCOUNTER — Encounter (HOSPITAL_COMMUNITY): Payer: Self-pay

## 2015-10-04 ENCOUNTER — Telehealth: Payer: Self-pay | Admitting: Gastroenterology

## 2015-10-04 DIAGNOSIS — Z955 Presence of coronary angioplasty implant and graft: Secondary | ICD-10-CM | POA: Insufficient documentation

## 2015-10-04 DIAGNOSIS — K219 Gastro-esophageal reflux disease without esophagitis: Secondary | ICD-10-CM | POA: Diagnosis not present

## 2015-10-04 DIAGNOSIS — E785 Hyperlipidemia, unspecified: Secondary | ICD-10-CM | POA: Insufficient documentation

## 2015-10-04 DIAGNOSIS — I69351 Hemiplegia and hemiparesis following cerebral infarction affecting right dominant side: Secondary | ICD-10-CM | POA: Insufficient documentation

## 2015-10-04 DIAGNOSIS — M19041 Primary osteoarthritis, right hand: Secondary | ICD-10-CM | POA: Insufficient documentation

## 2015-10-04 DIAGNOSIS — K644 Residual hemorrhoidal skin tags: Secondary | ICD-10-CM | POA: Diagnosis not present

## 2015-10-04 DIAGNOSIS — I2510 Atherosclerotic heart disease of native coronary artery without angina pectoris: Secondary | ICD-10-CM | POA: Diagnosis not present

## 2015-10-04 DIAGNOSIS — D509 Iron deficiency anemia, unspecified: Secondary | ICD-10-CM | POA: Diagnosis not present

## 2015-10-04 DIAGNOSIS — K2970 Gastritis, unspecified, without bleeding: Secondary | ICD-10-CM | POA: Diagnosis not present

## 2015-10-04 DIAGNOSIS — D120 Benign neoplasm of cecum: Secondary | ICD-10-CM | POA: Insufficient documentation

## 2015-10-04 DIAGNOSIS — Z7982 Long term (current) use of aspirin: Secondary | ICD-10-CM | POA: Diagnosis not present

## 2015-10-04 DIAGNOSIS — I739 Peripheral vascular disease, unspecified: Secondary | ICD-10-CM | POA: Diagnosis not present

## 2015-10-04 DIAGNOSIS — Z79899 Other long term (current) drug therapy: Secondary | ICD-10-CM | POA: Insufficient documentation

## 2015-10-04 DIAGNOSIS — M19042 Primary osteoarthritis, left hand: Secondary | ICD-10-CM | POA: Insufficient documentation

## 2015-10-04 DIAGNOSIS — Z7901 Long term (current) use of anticoagulants: Secondary | ICD-10-CM | POA: Diagnosis not present

## 2015-10-04 DIAGNOSIS — K319 Disease of stomach and duodenum, unspecified: Secondary | ICD-10-CM | POA: Insufficient documentation

## 2015-10-04 DIAGNOSIS — B379 Candidiasis, unspecified: Secondary | ICD-10-CM | POA: Diagnosis not present

## 2015-10-04 DIAGNOSIS — K5730 Diverticulosis of large intestine without perforation or abscess without bleeding: Secondary | ICD-10-CM | POA: Insufficient documentation

## 2015-10-04 DIAGNOSIS — D128 Benign neoplasm of rectum: Secondary | ICD-10-CM | POA: Diagnosis not present

## 2015-10-04 DIAGNOSIS — I10 Essential (primary) hypertension: Secondary | ICD-10-CM | POA: Diagnosis not present

## 2015-10-04 DIAGNOSIS — K648 Other hemorrhoids: Secondary | ICD-10-CM | POA: Insufficient documentation

## 2015-10-04 DIAGNOSIS — I69320 Aphasia following cerebral infarction: Secondary | ICD-10-CM | POA: Diagnosis not present

## 2015-10-04 DIAGNOSIS — K602 Anal fissure, unspecified: Secondary | ICD-10-CM | POA: Diagnosis not present

## 2015-10-04 DIAGNOSIS — Z951 Presence of aortocoronary bypass graft: Secondary | ICD-10-CM | POA: Insufficient documentation

## 2015-10-04 DIAGNOSIS — F329 Major depressive disorder, single episode, unspecified: Secondary | ICD-10-CM | POA: Diagnosis not present

## 2015-10-04 DIAGNOSIS — K625 Hemorrhage of anus and rectum: Secondary | ICD-10-CM | POA: Insufficient documentation

## 2015-10-04 MED ORDER — ATENOLOL 25 MG PO TABS
12.5000 mg | ORAL_TABLET | Freq: Two times a day (BID) | ORAL | Status: DC
  Filled 2015-10-04: qty 1

## 2015-10-04 MED ORDER — SERTRALINE HCL 50 MG PO TABS
75.0000 mg | ORAL_TABLET | Freq: Every evening | ORAL | Status: DC
  Administered 2015-10-04: 75 mg via ORAL
  Filled 2015-10-04: qty 2

## 2015-10-04 MED ORDER — PEG-KCL-NACL-NASULF-NA ASC-C 100 G PO SOLR
0.5000 | Freq: Once | ORAL | Status: AC
  Administered 2015-10-05: 100 g via ORAL
  Filled 2015-10-04: qty 1

## 2015-10-04 MED ORDER — ATORVASTATIN CALCIUM 40 MG PO TABS
80.0000 mg | ORAL_TABLET | Freq: Every day | ORAL | Status: DC
  Administered 2015-10-04: 80 mg via ORAL
  Filled 2015-10-04 (×2): qty 2

## 2015-10-04 MED ORDER — PEG-KCL-NACL-NASULF-NA ASC-C 100 G PO SOLR
0.5000 | Freq: Two times a day (BID) | ORAL | Status: DC
  Filled 2015-10-04: qty 1

## 2015-10-04 MED ORDER — ASPIRIN EC 81 MG PO TBEC: 81.0000 mg | DELAYED_RELEASE_TABLET | Freq: Every day | ORAL | Status: DC

## 2015-10-04 MED ORDER — PEG-KCL-NACL-NASULF-NA ASC-C 100 G PO SOLR: 1.0000 | Freq: Once | ORAL | Status: DC

## 2015-10-04 MED ORDER — TRIMETHOPRIM 100 MG PO TABS
100.0000 mg | ORAL_TABLET | Freq: Every evening | ORAL | Status: DC
  Administered 2015-10-04: 100 mg via ORAL
  Filled 2015-10-04 (×3): qty 1

## 2015-10-04 MED ORDER — PEG-KCL-NACL-NASULF-NA ASC-C 100 G PO SOLR
0.5000 | Freq: Two times a day (BID) | ORAL | Status: AC
  Administered 2015-10-04 (×2): 100 g via ORAL

## 2015-10-04 MED ORDER — OXYBUTYNIN CHLORIDE 5 MG PO TABS: 5.0000 mg | ORAL_TABLET | Freq: Every day | ORAL | Status: DC

## 2015-10-04 MED ORDER — METOCLOPRAMIDE HCL 5 MG/ML IJ SOLN
10.0000 mg | Freq: Once | INTRAMUSCULAR | Status: DC
Start: 2015-10-05 — End: 2015-10-04

## 2015-10-04 MED ORDER — METOCLOPRAMIDE HCL 5 MG/ML IJ SOLN: 10.0000 mg | Freq: Once | INTRAMUSCULAR | Status: DC

## 2015-10-04 MED ORDER — PREGABALIN 75 MG PO CAPS
150.0000 mg | ORAL_CAPSULE | Freq: Two times a day (BID) | ORAL | Status: DC
  Administered 2015-10-04: 150 mg via ORAL
  Filled 2015-10-04: qty 2

## 2015-10-04 MED ORDER — METOCLOPRAMIDE HCL 5 MG/ML IJ SOLN
10.0000 mg | Freq: Once | INTRAMUSCULAR | Status: AC
  Administered 2015-10-04: 10 mg via INTRAVENOUS
  Filled 2015-10-04: qty 2

## 2015-10-04 NOTE — Telephone Encounter (Signed)
Confirmed with Dawn the patient needs to be admitted today.

## 2015-10-04 NOTE — H&P (Signed)
.     History and Physical    Primary Care Physician:  Donnajean Lopes, MD Primary Gastroenterologist:  Dr. Havery Moros    Reason for Admission: H/O Left sided CVA needing to prep for colonoscopy tomorrow             HPI:   Kristin [name] is a 75 y.o. Caucasian female a history of left-sided MCA CVA 2001 on Plavix and CAD, status post PTCA in 2011 with stent placement, status post CABG in 2015, who is admitted to the hospital today in order to prep for her colonoscopy tomorrow for further evaluation of her iron deficiency anemia.  Patient was recently seen in our outpatient clinic by Dr. Havery Moros and much of the history is garnered from his last office note on 09/04/15. At that time, the patient endorsed some red blood in her stools intermittently with no associated rectal discomfort. She was unsure how long this had gone on for. She denied any constipation or diarrhea. Patient also denied abdominal pain. She has continued to eat well with no reported weight loss. The patient denied having a previous colonoscopy. She did report a history of eosinophilic esophagitis for which she had an EGD "years ago" for history of dysphagia. She has continued with intermittent dysphagia at this time which is not significantly bothersome. At time of her last office visit she did discuss some concern regarding the difficulty of doing bowel prep at home without assistance due to difficulty ambulating after her stroke.  Today, the patient tells me that she did stop her Plavix as directed. She denies any new complaints or concerns. She does continue with some intermittent rectal bleeding. She denies shortness of breath, dizziness or syncopal episodes.  Recent labs: 09/18/2015 show a hemoglobin of 10.7, compared with 11.5 on 09/04/15. Iron studies completed on 09/04/15 show a decreased iron at 31, decreased saturation at 6.3, decreased ferritin at 7.3.  Past Medical History  Diagnosis Date  . Stroke [facility]) 2001   reesultant Rt spastic hemplegia and aphasia   . Essential hypertension   . CAD (coronary artery disease)     a. 2010: stents pRCA and dRCA. b. LHC 12/13/13: Minimal distal LM dz. LAD 95% ostial w/ L-->R collat. LCx mild dz. RCA 95% mid-dRCA. Patent stents proximal and distal RCA. EF not assessed, Nl by nuclear study.     . Ear pain     right  . Hyperlipidemia with target LDL less than 70   . Central retinal vein occlusion, right eye   . Diplopia   . Eosinophilic esophagitis   . Incontinence     urine  . GERD (gastroesophageal reflux disease)   . Arthritis     hands  . Depression   . H/O: whooping cough     Past Surgical History  Procedure Laterality Date  . Abdominal hysterectomy    . Carotid stent    . Lasix    . Cataracts    . Retina injection    . Esophagogastroduodenoscopy    . Coronary artery bypass graft N/A 12/17/2013    Procedure: CORONARY ARTERY BYPASS GRAFTING (CABG);  Surgeon: Gaye Pollack, MD;  Location: [facility];  Service: Open Heart Surgery;  Laterality: N/A;  Times 2 using left internal mammary artery and endoscopically harvested right saphenous vein  . Intraoperative transesophageal echocardiogram N/A 12/17/2013    Procedure: INTRAOPERATIVE TRANSESOPHAGEAL ECHOCARDIOGRAM;  Surgeon: Gaye Pollack, MD;  Location: [facility] OR;  Service: Open Heart Surgery;  Laterality: N/A;  .  Left heart catheterization with coronary angiogram N/A 12/13/2013    Procedure: LEFT HEART CATHETERIZATION WITH CORONARY ANGIOGRAM;  Surgeon: Jettie Booze, MD;  Location: [facility] CATH LAB;  Service: Cardiovascular;  Laterality: N/A;    Family History  Problem Relation Age of Onset  . Diabetes Mother   . Heart disease Mother   . Heart attack Father   . Breast cancer Sister     Social History  Substance Use Topics  . Smoking status: Never Smoker   . Smokeless tobacco: Never Used  . Alcohol Use: 0.0 oz/week    0 Standard drinks or equivalent per week     Comment: glass of wine a day     Prior to Admission medications   Medication Sig Start Date End Date Taking? Authorizing Provider  aspirin 81 MG tablet Take 81 mg by mouth daily.   Yes Historical Provider, MD  atenolol (TENORMIN) 25 MG tablet Take 12.5 mg by mouth 2 (two) times daily.   Yes Historical Provider, MD  atorvastatin (LIPITOR) 40 MG tablet Take 80 mg by mouth daily at 6 PM.    Yes Historical Provider, MD  cephALEXin (KEFLEX) 500 MG capsule Take 1 capsule (500 mg total) by mouth 3 (three) times daily. Patient taking differently: Take 500 mg by mouth 3 (three) times daily. Take for 10 days starting on 09/18/15. 09/18/15  Yes Leo Grosser, MD  clopidogrel (PLAVIX) 75 MG tablet Take 75 mg by mouth daily.   Yes Historical Provider, MD  diclofenac sodium (VOLTAREN) 1 % GEL Apply 4 g topically 4 (four) times daily as needed (pain).    Yes Historical Provider, MD  ferrous sulfate 325 (65 FE) MG tablet Take 1 tablet (325 mg total) by mouth daily with breakfast. 09/08/15  Yes Manus Gunning, MD  omeprazole (PRILOSEC) 20 MG capsule Take 20 mg by mouth daily.   Yes Historical Provider, MD  oxybutynin (DITROPAN) 5 MG tablet Take 5 mg by mouth daily.   Yes Historical Provider, MD  pregabalin (LYRICA) 150 MG capsule Take 150 mg by mouth 2 (two) times daily.    Yes Historical Provider, MD  sertraline (ZOLOFT) 50 MG tablet Take 75 mg by mouth every evening.    Yes Historical Provider, MD  trimethoprim (TRIMPEX) 100 MG tablet Take 100 mg by mouth every evening.    Yes Historical Provider, MD    Current Facility-Administered Medications  Medication Dose Route Frequency Provider Last Rate Last Dose  . metoCLOPramide (REGLAN) injection 10 mg  10 mg Intravenous Once Levin Erp, [place]       Followed by  . [START ON 10/05/2015] metoCLOPramide (REGLAN) injection 10 mg  10 mg Intravenous Once [employer], [place]      . peg 3350 powder (MOVIPREP) kit 100 g  0.5 kit Oral BID Levin Erp, [place]        Allergies  as of 09/08/2015  . (No Known Allergies)     Review of Systems:    Constitutional: No weight loss, fever or chills HEENT: Eyes: No change in vision               Ears, Nose, Throat:  No change in hearing Skin: No rash or itching Cardiovascular: No chest pain or palpitations      Respiratory: No SOB or cough Gastrointestinal: See HPI and otherwise negative Genitourinary: No dysuria or change in urinary frequency Neurological: See history of present illness and otherwise negative Musculoskeletal: No muscle or back pain Hematologic:  Positive for iron deficiency anemia and intermittent rectal bleeding Lymphatics: No enlarged lymph nodes or history of splenectomy Psychiatric: Positive for history of depression No history of anxiety    Physical Exam:  Vital signs in last 24 hours: Temp:  [97.6 F (36.4 C)] 97.6 F (36.4 C) (06/14 1456) Pulse Rate:  [55] 55 (06/14 1456) Resp:  [24] 24 (06/14 1456) BP: (125)/(75) 125/75 mmHg (06/14 1456) SpO2:  [98 %] 98 % (06/14 1456) Weight:  [184 lb 6.4 oz (83.643 kg)] 184 lb 6.4 oz (83.643 kg) (06/14 1456)   General:   Pleasant Caucasian female appears to be in NAD, Well developed, Well nourished, alert and cooperative Head:  Normocephalic and atraumatic. Eyes:   PEERL, EOMI. No icterus. Conjunctiva pink. Ears:  Normal auditory acuity. Neck:  Supple Throat: Oral cavity and pharynx without inflammation, swelling or lesion. Teeth in good condition. Lungs: Respirations even and unlabored. Lungs clear to auscultation bilaterally.   No wheezes, crackles, or rhonchi.  Heart: Normal S1, S2. No MRG. Regular rate and rhythm. No peripheral edema, cyanosis or pallor.  Abdomen:  Soft, nondistended, nontender. No rebound or guarding. Normal bowel sounds. No appreciable masses or hepatomegaly. Rectal:  Not performed.  Msk:  Symmetrical without gross deformities. Peripheral pulses intact. Uses walker to ambulate Extremities:  Without edema, no deformity or  joint abnormality. Normal ROM, normal sensation. Neurologic:  Alert and  Oriented; can answer yes/no questions. A phasic otherwise with right-sided hemiplegia Skin:   Dry and intact without significant lesions or rashes. Psychiatric: Normal mood and affect. Behavior normal  LAB RESULTS: Results for Kristin, [name] (MRN [id number]) as of 10/04/2015 15:26  Ref. Range 09/04/2015 11:56 09/18/2015 21:17  Sodium Latest Ref Range: 135-145 mmol/L  137  Potassium Latest Ref Range: 3.5-5.1 mmol/L  3.6  Chloride Latest Ref Range: 101-111 mmol/L  106  CO2 Latest Ref Range: 22-32 mmol/L  22  BUN Latest Ref Range: 6-20 mg/dL  17  Creatinine Latest Ref Range: 0.44-1.00 mg/dL  0.88  Calcium Latest Ref Range: 8.9-10.3 mg/dL  8.5 (L)  EGFR (Non-African Amer.) Latest Ref Range: >60 mL/min  >60  EGFR (African American) Latest Ref Range: >60 mL/min  >60  Glucose Latest Ref Range: 65-99 mg/dL  117 (H)  Anion gap Latest Ref Range: 5-15   9  Iron Latest Ref Range: 42-145 ug/dL 31 (L)   Saturation Ratios Latest Ref Range: 20.0-50.0 % 6.3 (L)   Ferritin Latest Ref Range: 10.0-291.0 ng/mL 7.3 (L)   Transferrin Latest Ref Range: 212.0-360.0 mg/dL 349.0   Folate Latest Ref Range: >5.9 ng/mL >23.3   Vitamin B12 Latest Ref Range: 211-911 pg/mL 254   WBC Latest Ref Range: 4.0-10.5 K/uL 5.8 8.0  RBC Latest Ref Range: 3.87-5.11 MIL/uL 5.11 4.83  Hemoglobin Latest Ref Range: 12.0-15.0 g/dL 11.5 (L) 10.7 (L)  HCT Latest Ref Range: 36.0-46.0 % 35.9 (L) 36.0  MCV Latest Ref Range: 78.0-100.0 fL 70.2 (L) 74.5 (L)  MCH Latest Ref Range: 26.0-34.0 pg  22.2 (L)  MCHC Latest Ref Range: 30.0-36.0 g/dL 32.0 29.7 (L)  RDW Latest Ref Range: 11.5-15.5 % 19.6 (H) 18.9 (H)  Platelets Latest Ref Range: 150-400 K/uL 239.0 217  Neutrophils Latest Ref Range: 43.0-77.0 % 68.6   Lymphocytes Latest Ref Range: 12.0-46.0 % 17.7   Monocytes Relative Latest Ref Range: 3.0-12.0 % 8.7   Eosinophil Latest Ref Range: 0.0-5.0 % 4.3   Basophil Latest  Ref Range: 0.0-3.0 % 0.7   NEUT# Latest Ref Range: 1.4-7.7 K/uL 4.0  Lymphocyte # Latest Ref Range: 0.7-4.0 K/uL 1.0   Monocyte # Latest Ref Range: 0.1-1.0 K/uL 0.5   Eosinophils Absolute Latest Ref Range: 0.0-0.7 K/uL 0.3   Basophils Absolute Latest Ref Range: 0.0-0.1 K/uL 0.0   RBC. Latest Ref Range: 3.80-5.10 MIL/uL 5.04   Retic Ct Pct Latest Units: % 0.8   ABS Retic Latest Ref Range: 20000-80000 cells/uL 40320     Impression / Plan:   1. Iron deficiency anemia: Patient scheduled for EGD and colonoscopy tomorrow with Dr. Havery Moros for further evaluation 2. Rectal bleeding: Anal fissure and external hemorrhoids noted on recent DRE at office, further evaluation planned as above   Levin Erp  10/04/2015, 3:38 PM Pager #: [phone]

## 2015-10-04 NOTE — Progress Notes (Signed)
PATIENT HAS APHASIA-MIND GOOD JUST TAKES HER A LITTLE BIT TO GET HER RESPONSE OUT

## 2015-10-05 ENCOUNTER — Observation Stay (HOSPITAL_COMMUNITY): Payer: Medicare Other | Admitting: Certified Registered"

## 2015-10-05 ENCOUNTER — Encounter (HOSPITAL_COMMUNITY): Payer: Self-pay | Admitting: Certified Registered"

## 2015-10-05 ENCOUNTER — Encounter (HOSPITAL_COMMUNITY)
Admission: RE | Disposition: A | Discharge status: Home / Self Care | Payer: Self-pay | Source: Ambulatory Visit | Attending: Gastroenterology

## 2015-10-05 DIAGNOSIS — D128 Benign neoplasm of rectum: Secondary | ICD-10-CM | POA: Diagnosis not present

## 2015-10-05 DIAGNOSIS — K3189 Other diseases of stomach and duodenum: Secondary | ICD-10-CM | POA: Diagnosis not present

## 2015-10-05 DIAGNOSIS — E785 Hyperlipidemia, unspecified: Secondary | ICD-10-CM | POA: Diagnosis not present

## 2015-10-05 DIAGNOSIS — I10 Essential (primary) hypertension: Secondary | ICD-10-CM | POA: Diagnosis not present

## 2015-10-05 DIAGNOSIS — B3781 Candidal esophagitis: Secondary | ICD-10-CM | POA: Diagnosis not present

## 2015-10-05 DIAGNOSIS — R1310 Dysphagia, unspecified: Secondary | ICD-10-CM | POA: Diagnosis not present

## 2015-10-05 DIAGNOSIS — D509 Iron deficiency anemia, unspecified: Secondary | ICD-10-CM | POA: Diagnosis not present

## 2015-10-05 DIAGNOSIS — D120 Benign neoplasm of cecum: Secondary | ICD-10-CM

## 2015-10-05 DIAGNOSIS — I2510 Atherosclerotic heart disease of native coronary artery without angina pectoris: Secondary | ICD-10-CM | POA: Diagnosis not present

## 2015-10-05 DIAGNOSIS — Z1211 Encounter for screening for malignant neoplasm of colon: Secondary | ICD-10-CM | POA: Diagnosis not present

## 2015-10-05 HISTORY — PX: ESOPHAGOGASTRODUODENOSCOPY: SHX5428

## 2015-10-05 HISTORY — PX: COLONOSCOPY: SHX5424

## 2015-10-05 SURGERY — COLONOSCOPY
Anesthesia: Monitor Anesthesia Care

## 2015-10-05 MED ORDER — PROPOFOL 10 MG/ML IV BOLUS
INTRAVENOUS | Status: AC
  Filled 2015-10-05: qty 40

## 2015-10-05 MED ORDER — LACTATED RINGERS IV SOLN
INTRAVENOUS | Status: DC
  Administered 2015-10-05: 08:00:00 via INTRAVENOUS

## 2015-10-05 MED ORDER — PROPOFOL 10 MG/ML IV BOLUS
INTRAVENOUS | Status: DC | PRN
  Administered 2015-10-05: 50 mg via INTRAVENOUS
  Administered 2015-10-05 (×2): 20 mg via INTRAVENOUS
  Administered 2015-10-05 (×2): 30 mg via INTRAVENOUS
  Administered 2015-10-05: 50 mg via INTRAVENOUS
  Administered 2015-10-05: 30 mg via INTRAVENOUS
  Administered 2015-10-05: 20 mg via INTRAVENOUS
  Administered 2015-10-05: 30 mg via INTRAVENOUS

## 2015-10-05 NOTE — Transfer of Care (Signed)
Immediate Anesthesia Transfer of Care Note  Patient: Kristin [name]  Procedure(s) Performed: Procedure(s): COLONOSCOPY (N/A) ESOPHAGOGASTRODUODENOSCOPY (EGD) (N/A)  Patient Location: PACU  Anesthesia Type:MAC  Level of Consciousness:  sedated, patient cooperative and responds to stimulation  Airway & Oxygen Therapy:Patient Spontanous Breathing   Post-op Assessment:  Report given to PACU RN and Post -op Vital signs reviewed and stable  Post vital signs:  Reviewed and stable  Last Vitals:  Filed Vitals:   10/05/15 0535 10/05/15 0803  BP: 154/82 172/79  Pulse: 65 67  Temp: 36.4 C 36.4 C  Resp: 18 15    Complications: No apparent anesthesia complications

## 2015-10-05 NOTE — Anesthesia Postprocedure Evaluation (Signed)
Anesthesia Post Note  Patient: Kristin [name]  Procedure(s) Performed: Procedure(s) (LRB): COLONOSCOPY (N/A) ESOPHAGOGASTRODUODENOSCOPY (EGD) (N/A)  Patient location during evaluation: PACU Anesthesia Type: MAC Level of consciousness: awake and alert Pain management: pain level controlled Vital Signs Assessment: post-procedure vital signs reviewed and stable Respiratory status: spontaneous breathing Cardiovascular status: stable Anesthetic complications: no    Last Vitals:  Filed Vitals:   10/05/15 0950 10/05/15 1000  BP: 159/67 157/65  Pulse: 55 52  Temp:    Resp: 20 18    Last Pain:  Filed Vitals:   10/05/15 1000  PainSc: 0-No pain                 Nolon Nations

## 2015-10-05 NOTE — Op Note (Signed)
[facility] Patient Name: Kristin [name] Procedure Date: 10/05/2015 MRN: [id number] Attending MD: Carlota Raspberry. Havery Moros , MD Date of Birth: 06/07/1940 CSN: [id number] Age: 75 Admit Type: Outpatient Procedure:                Upper GI endoscopy Indications:              Iron deficiency anemia Providers:                Remo Lipps P. Havery Moros, MD, Dustin Flock, RN,                            William Dalton, Technician Referring MD:              Medicines:                Monitored Anesthesia Care Complications:            No immediate complications. Estimated blood loss:                            Minimal. Estimated Blood Loss:     Estimated blood loss was minimal. Procedure:                Pre-Anesthesia Assessment:                           - Prior to the procedure, a History and Physical                            was performed, and patient medications and                            allergies were reviewed. The patient's tolerance of                            previous anesthesia was also reviewed. The risks                            and benefits of the procedure and the sedation                            options and risks were discussed with the patient.                            All questions were answered, and informed consent                            was obtained. Prior Anticoagulants: The patient has                            taken Pletal (cilostazol), last dose was 5 days                            prior to procedure. ASA Grade Assessment: III - A  patient with severe systemic disease. After                            reviewing the risks and benefits, the patient was                            deemed in satisfactory condition to undergo the                            procedure.                           After obtaining informed consent, the endoscope was                            passed under direct vision. Throughout the                             procedure, the patient's blood pressure, pulse, and                            oxygen saturations were monitored continuously. The                            [id number]) scope was introduced through the                            mouth, and advanced to the second part of duodenum.                            The upper GI endoscopy was accomplished without                            difficulty. The patient tolerated the procedure                            well. Scope In: Scope Out: Findings:      Esophagogastric landmarks were identified: the Z-line was found at 40       cm, the gastroesophageal junction was found at 40 cm and the upper       extent of the gastric folds was found at 40 cm from the incisors.      Patchy white plaques were noted along the esophagus, consistent with       candidiasis, found in the entire esophagus. Brushings were obtained were       obtained to evaluate for candidiasis.      The exam of the esophagus was otherwise normal.      Patchy mildly erythematous mucosa was found in the gastric fundus.       Biopsies were taken with a cold forceps for Helicobacter pylori testing.      The exam of the stomach was otherwise normal. No ulcerations or erosions       noted.      The duodenal bulb and second portion of the duodenum were normal. Impression:               -  Esophagogastric landmarks identified.                           - Suspected monilial esophagitis. Brushings                            obtained.                           - Erythematous mucosa in the gastric fundus.                            Biopsied. Otherwise normal stomach                           - Normal duodenal bulb and second portion of the                            duodenum.                           Overall, no overt etiology for iron deficiency                            noted on this exam. Moderate Sedation:      No moderate sedation, case performed with  MAC Recommendation:           - Patient has a contact number available for                            emergencies. The signs and symptoms of potential                            delayed complications were discussed with the                            patient. Return to normal activities tomorrow.                            Written discharge instructions were provided to the                            patient.                           - Resume previous diet.                           - Continue present medications. Resume aspirin now,                            resume plavix per colonoscopy note in 3 days                           - Await pathology results, should be back in a few  days Procedure Code(s):        --- Professional ---                           [phone], Esophagogastroduodenoscopy, flexible,                            transoral; with biopsy, single or multiple Diagnosis Code(s):        --- Professional ---                           B37.81, Candidal esophagitis                           K31.89, Other diseases of stomach and duodenum                           D50.9, Iron deficiency anemia, unspecified                           R13.10, Dysphagia, unspecified CPT copyright 2016 [employer]. All rights reserved. The codes documented in this report are preliminary and upon coder review may  be revised to meet current compliance requirements. Remo Lipps P. Renaldo Gornick, MD 10/05/2015 9:37:20 AM This report has been signed electronically. Number of Addenda: 0

## 2015-10-05 NOTE — Discharge Summary (Signed)
Patient had EGD and Colonoscopy by Dr. Havery Moros today for further eval of IDA.

## 2015-10-05 NOTE — Interval H&P Note (Signed)
History and Physical Interval Note:  10/05/2015 8:23 AM  Kristin [name]  has presented today for surgery, with the diagnosis of rectal bleeding  The various methods of treatment have been discussed with the patient and family. After consideration of risks, benefits and other options for treatment, the patient has consented to  Procedure(s): COLONOSCOPY (N/A) ESOPHAGOGASTRODUODENOSCOPY (EGD) (N/A) as a surgical intervention .  The patient's history has been reviewed, patient examined, no change in status, stable for surgery.  I have reviewed the patient's chart and labs.  Questions were answered to the patient's satisfaction.     Kristin [name]

## 2015-10-05 NOTE — Anesthesia Preprocedure Evaluation (Addendum)
Anesthesia Evaluation  Patient identified by MRN, date of birth, ID band Patient awake    Reviewed: Allergy & Precautions, H&P , NPO status , Patient's Chart, lab work & pertinent test results, reviewed documented beta blocker date and time   History of Anesthesia Complications Negative for: history of anesthetic complications  Airway Mallampati: III  TM Distance: >3 FB Neck ROM: Full    Dental  (+) Teeth Intact, Dental Advisory Given   Pulmonary neg pulmonary ROS,    breath sounds clear to auscultation       Cardiovascular hypertension, Pt. on medications and Pt. on home beta blockers + angina + CAD, + Cardiac Stents, + CABG and + Peripheral Vascular Disease   Rhythm:Regular Rate:Normal  8/15 ECHO: EF 65%, aortic sclerosis without stenosis   Neuro/Psych PSYCHIATRIC DISORDERS Depression CVA (aphasia and R hemiparesis), Residual Symptoms    GI/Hepatic Neg liver ROS, GERD  Medicated and Controlled,  Endo/Other  negative endocrine ROS  Renal/GU negative Renal ROS     Musculoskeletal  (+) Arthritis ,   Abdominal   Peds  Hematology negative hematology ROS (+) anemia ,   Anesthesia Other Findings   Reproductive/Obstetrics                             Anesthesia Physical  Anesthesia Plan  ASA: III  Anesthesia Plan: MAC   Post-op Pain Management:    Induction: Intravenous  Airway Management Planned:   Additional Equipment:   Intra-op Plan:   Post-operative Plan:   Informed Consent: I have reviewed the patients History and Physical, chart, labs and discussed the procedure including the risks, benefits and alternatives for the proposed anesthesia with the patient or authorized representative who has indicated his/her understanding and acceptance.   Dental advisory given  Plan Discussed with: CRNA  Anesthesia Plan Comments:        Anesthesia Quick Evaluation

## 2015-10-05 NOTE — Op Note (Signed)
[facility] Patient Name: Kristin [name] Procedure Date: 10/05/2015 MRN: [id number] Attending MD: Carlota Raspberry. Havery Moros , MD Date of Birth: [birth year] CSN: [id number] Age: 75 Admit Type: Outpatient Procedure:                Colonoscopy Indications:              Unexplained iron deficiency anemia, this is the                            patient's first colonoscopy Providers:                Carlota Raspberry. Havery Moros, MD, Dustin Flock, RN,                            William Dalton, Technician Referring MD:              Medicines:                Monitored Anesthesia Care Complications:            No immediate complications. Estimated blood loss:                            Minimal. Estimated Blood Loss:     Estimated blood loss was minimal. Procedure:                Pre-Anesthesia Assessment:                           - Prior to the procedure, a History and Physical                            was performed, and patient medications and                            allergies were reviewed. The patient's tolerance of                            previous anesthesia was also reviewed. The risks                            and benefits of the procedure and the sedation                            options and risks were discussed with the patient.                            All questions were answered, and informed consent                            was obtained. Prior Anticoagulants: The patient has                            taken Pletal (cilostazol), last dose was 5 days                            prior to  procedure. ASA Grade Assessment: III - A                            patient with severe systemic disease. After                            reviewing the risks and benefits, the patient was                            deemed in satisfactory condition to undergo the                            procedure.                           - Prior to the procedure, a History and Physical              was performed, and patient medications and                            allergies were reviewed. The patient's tolerance of                            previous anesthesia was also reviewed. The risks                            and benefits of the procedure and the sedation                            options and risks were discussed with the patient.                            All questions were answered, and informed consent                            was obtained. Prior Anticoagulants: The patient has                            taken Plavix (clopidogrel), last dose was 5 days                            prior to procedure. ASA Grade Assessment: III - A                            patient with severe systemic disease. After                            reviewing the risks and benefits, the patient was                            deemed in satisfactory condition to undergo the                            procedure.  After obtaining informed consent, the colonoscope                            was passed under direct vision. Throughout the                            procedure, the patient's blood pressure, pulse, and                            oxygen saturations were monitored continuously. The                            EC-[id number]) scope was introduced through                            the anus and advanced to the the terminal ileum,                            with identification of the appendiceal orifice and                            IC valve. The colonoscopy was performed without                            difficulty. The patient tolerated the procedure                            well. The quality of the bowel preparation was                            adequate. The terminal ileum, ileocecal valve,                            appendiceal orifice, and rectum were photographed. Scope In: 8:52:22 AM Scope Out: 9:20:18 AM Scope Withdrawal Time: 0 hours 24  minutes 33 seconds  Total Procedure Duration: 0 hours 27 minutes 56 seconds  Findings:      The perianal exam findings include non-thrombosed external hemorrhoids.      Multiple small and large-mouthed diverticula were found in the entire       colon.      A 3 mm polyp was found in the cecum. The polyp was sessile. The polyp       was removed with a cold biopsy forceps. Resection and retrieval were       complete.      A 6 mm polyp was found in the rectum. The polyp was semi-pedunculated.       The polyp was removed with a hot snare. Resection and retrieval were       complete.      The terminal ileum appeared normal.      A localized area of granular mucosa with loss of normal vascularity was       found in the distal 2-3cm of the rectum. Biopsies were taken with a cold       forceps for histology.      Non-bleeding internal hemorrhoids were found during retroflexion. The  hemorrhoids were moderate / large, and inflamed.      The exam was otherwise normal throughout the examined colon. Impression:               - Non-thrombosed external hemorrhoids found on                            perianal exam.                           - Diverticulosis in the entire examined colon.                           - One 3 mm polyp in the cecum, removed with a cold                            biopsy forceps. Resected and retrieved.                           - One 6 mm polyp in the rectum, removed with a hot                            snare. Resected and retrieved.                           - The examined portion of the ileum was normal.                           - Altered vascular and granular mucosa in the                            rectum. Biopsied. Suspect this likely represents                            bowel prep artifact or prolapse changes, less                            likely prolapse.                           - Non-bleeding inflamed internal hemorrhoids.                            Overall, bleeding hemorrhoids could be causing iron                            loss in the setting of Plavix if bleeding is                            frequent. No other pathology noted on this exam to                            account for anemia. Moderate Sedation:      No moderate sedation, case performed with MAC Recommendation:           -  Patient has a contact number available for                            emergencies. The signs and symptoms of potential                            delayed complications were discussed with the                            patient. Return to normal activities tomorrow.                            Written discharge instructions were provided to the                            patient.                           - Resume previous diet.                           - Continue present medications including aspirin                           - Resume plavix in 3 days                           - Await pathology results.                           - Continue oral iron supplementation (ferrous                            sulfate 325mg  daily). Miralax PRN to keep stools                            soft                           - Repeat CBC in 2-3 weeks to ensure Hgb stable and                            iron stores improved Procedure Code(s):        --- Professional ---                           [phone], Colonoscopy, flexible; with removal of                            tumor(s), polyp(s), or other lesion(s) by snare                            technique                           45380, 59, Colonoscopy, flexible; with biopsy,  single or multiple Diagnosis Code(s):        --- Professional ---                           K64.4, Residual hemorrhoidal skin tags                           K64.8, Other hemorrhoids                           D12.0, Benign neoplasm of cecum                           K62.1, Rectal polyp                           K62.89,  Other specified diseases of anus and rectum                           D50.9, Iron deficiency anemia, unspecified                           K57.30, Diverticulosis of large intestine without                            perforation or abscess without bleeding CPT copyright 2016 [employer]. All rights reserved. The codes documented in this report are preliminary and upon coder review may  be revised to meet current compliance requirements. Remo Lipps P. Armbruster, MD 10/05/2015 9:45:35 AM This report has been signed electronically. Number of Addenda: 0

## 2015-10-05 NOTE — Discharge Instructions (Signed)
You will be contacted in regards to a follow-up appointment with Dr. Havery Moros.  Please restart your Plavix in 3 days.

## 2015-10-06 ENCOUNTER — Other Ambulatory Visit: Payer: Self-pay | Admitting: Gastroenterology

## 2015-10-06 DIAGNOSIS — B3781 Candidal esophagitis: Secondary | ICD-10-CM

## 2015-10-06 DIAGNOSIS — D509 Iron deficiency anemia, unspecified: Secondary | ICD-10-CM

## 2015-10-06 MED ORDER — FLUCONAZOLE 200 MG PO TABS: ORAL_TABLET | ORAL | Status: DC

## 2015-10-09 ENCOUNTER — Encounter (HOSPITAL_COMMUNITY): Payer: Self-pay | Admitting: Gastroenterology

## 2015-10-11 ENCOUNTER — Encounter (INDEPENDENT_AMBULATORY_CARE_PROVIDER_SITE_OTHER): Payer: Medicare Other | Admitting: Ophthalmology

## 2015-10-11 DIAGNOSIS — H35033 Hypertensive retinopathy, bilateral: Secondary | ICD-10-CM

## 2015-10-11 DIAGNOSIS — I10 Essential (primary) hypertension: Secondary | ICD-10-CM | POA: Diagnosis not present

## 2015-10-11 DIAGNOSIS — H43813 Vitreous degeneration, bilateral: Secondary | ICD-10-CM | POA: Diagnosis not present

## 2015-10-11 DIAGNOSIS — H348112 Central retinal vein occlusion, right eye, stable: Secondary | ICD-10-CM | POA: Diagnosis not present

## 2015-10-17 ENCOUNTER — Encounter (HOSPITAL_COMMUNITY): Payer: Self-pay | Admitting: Emergency Medicine

## 2015-10-17 ENCOUNTER — Emergency Department (HOSPITAL_COMMUNITY): Payer: Medicare Other

## 2015-10-17 ENCOUNTER — Observation Stay (HOSPITAL_COMMUNITY)
Admission: EM | Admit: 2015-10-17 | Discharge: 2015-10-18 | Disposition: A | Discharge status: Home / Self Care | Payer: Medicare Other | Attending: Emergency Medicine | Admitting: Emergency Medicine

## 2015-10-17 DIAGNOSIS — R0789 Other chest pain: Secondary | ICD-10-CM

## 2015-10-17 DIAGNOSIS — R079 Chest pain, unspecified: Secondary | ICD-10-CM | POA: Diagnosis not present

## 2015-10-17 DIAGNOSIS — Z7902 Long term (current) use of antithrombotics/antiplatelets: Secondary | ICD-10-CM | POA: Diagnosis not present

## 2015-10-17 DIAGNOSIS — F329 Major depressive disorder, single episode, unspecified: Secondary | ICD-10-CM | POA: Insufficient documentation

## 2015-10-17 DIAGNOSIS — Z8673 Personal history of transient ischemic attack (TIA), and cerebral infarction without residual deficits: Secondary | ICD-10-CM | POA: Insufficient documentation

## 2015-10-17 DIAGNOSIS — Z7982 Long term (current) use of aspirin: Secondary | ICD-10-CM | POA: Diagnosis not present

## 2015-10-17 DIAGNOSIS — I10 Essential (primary) hypertension: Secondary | ICD-10-CM | POA: Insufficient documentation

## 2015-10-17 DIAGNOSIS — R001 Bradycardia, unspecified: Secondary | ICD-10-CM | POA: Diagnosis not present

## 2015-10-17 DIAGNOSIS — R062 Wheezing: Secondary | ICD-10-CM | POA: Insufficient documentation

## 2015-10-17 DIAGNOSIS — E785 Hyperlipidemia, unspecified: Secondary | ICD-10-CM | POA: Diagnosis not present

## 2015-10-17 DIAGNOSIS — Z951 Presence of aortocoronary bypass graft: Secondary | ICD-10-CM | POA: Insufficient documentation

## 2015-10-17 DIAGNOSIS — I2510 Atherosclerotic heart disease of native coronary artery without angina pectoris: Secondary | ICD-10-CM | POA: Insufficient documentation

## 2015-10-17 DIAGNOSIS — R072 Precordial pain: Secondary | ICD-10-CM | POA: Diagnosis not present

## 2015-10-17 LAB — BASIC METABOLIC PANEL
Anion gap: 8 (ref 5–15)
BUN: 16 mg/dL (ref 6–20)
CO2: 23 mmol/L (ref 22–32)
Calcium: 8.9 mg/dL (ref 8.9–10.3)
Chloride: 107 mmol/L (ref 101–111)
Creatinine, Ser: 0.79 mg/dL (ref 0.44–1.00)
GFR calc Af Amer: >60 mL/min (ref >60)
GFR calc non Af Amer: >60 mL/min (ref >60)
Glucose, Bld: 122 mg/dL — ABNORMAL HIGH (ref 65–99)
Potassium: 4 mmol/L (ref 3.5–5.1)
Sodium: 138 mmol/L (ref 135–145)

## 2015-10-17 LAB — CBC
HCT: 38.6 % (ref 36.0–46.0)
Hemoglobin: 11.7 g/dL — ABNORMAL LOW (ref 12.0–15.0)
MCH: 23.3 pg — ABNORMAL LOW (ref 26.0–34.0)
MCHC: 30.3 g/dL (ref 30.0–36.0)
MCV: 76.7 fL — ABNORMAL LOW (ref 78.0–100.0)
Platelets: 204 10*3/uL (ref 150–400)
RBC: 5.03 MIL/uL (ref 3.87–5.11)
RDW: 19.7 % — ABNORMAL HIGH (ref 11.5–15.5)
WBC: 5.6 10*3/uL (ref 4.0–10.5)

## 2015-10-17 LAB — I-STAT TROPONIN, ED: Troponin i, poc: 0 ng/mL (ref 0.00–0.08)

## 2015-10-17 LAB — TROPONIN I: Troponin I: <0.03 ng/mL (ref <0.03)

## 2015-10-17 MED ORDER — SERTRALINE HCL 50 MG PO TABS
75.0000 mg | ORAL_TABLET | Freq: Every evening | ORAL | Status: DC
  Administered 2015-10-17: 75 mg via ORAL
  Filled 2015-10-17: qty 2

## 2015-10-17 MED ORDER — ASPIRIN EC 81 MG PO TBEC
81.0000 mg | DELAYED_RELEASE_TABLET | Freq: Every day | ORAL | Status: DC
  Administered 2015-10-18: 81 mg via ORAL
  Filled 2015-10-17 (×2): qty 1

## 2015-10-17 MED ORDER — OXYBUTYNIN CHLORIDE 5 MG PO TABS: 5.0000 mg | ORAL_TABLET | Freq: Every day | ORAL | Status: DC

## 2015-10-17 MED ORDER — ENOXAPARIN SODIUM 30 MG/0.3ML ~~LOC~~ SOLN
30.0000 mg | SUBCUTANEOUS | Status: DC
  Administered 2015-10-17: 30 mg via SUBCUTANEOUS
  Filled 2015-10-17: qty 0.3

## 2015-10-17 MED ORDER — ASPIRIN 81 MG PO TABS: 81.0000 mg | ORAL_TABLET | Freq: Every day | ORAL | Status: DC

## 2015-10-17 MED ORDER — PANTOPRAZOLE SODIUM 40 MG PO TBEC
40.0000 mg | DELAYED_RELEASE_TABLET | Freq: Every day | ORAL | Status: DC
  Administered 2015-10-18: 40 mg via ORAL
  Filled 2015-10-17: qty 1

## 2015-10-17 MED ORDER — CLOPIDOGREL BISULFATE 75 MG PO TABS
75.0000 mg | ORAL_TABLET | Freq: Every day | ORAL | Status: DC
  Administered 2015-10-18: 75 mg via ORAL
  Filled 2015-10-17: qty 1

## 2015-10-17 MED ORDER — PREGABALIN 50 MG PO CAPS
150.0000 mg | ORAL_CAPSULE | Freq: Two times a day (BID) | ORAL | Status: DC
  Administered 2015-10-17 – 2015-10-18 (×2): 150 mg via ORAL
  Filled 2015-10-17 (×2): qty 1

## 2015-10-17 MED ORDER — FLUCONAZOLE 100 MG PO TABS
200.0000 mg | ORAL_TABLET | Freq: Every day | ORAL | Status: DC
  Filled 2015-10-17 (×2): qty 2

## 2015-10-17 MED ORDER — ATORVASTATIN CALCIUM 80 MG PO TABS
80.0000 mg | ORAL_TABLET | Freq: Every day | ORAL | Status: DC
  Administered 2015-10-17: 80 mg via ORAL
  Filled 2015-10-17: qty 1

## 2015-10-17 MED ORDER — ACETAMINOPHEN 325 MG PO TABS: 650.0000 mg | ORAL_TABLET | ORAL | Status: DC | PRN

## 2015-10-17 MED ORDER — OXYBUTYNIN CHLORIDE 5 MG PO TABS
5.0000 mg | ORAL_TABLET | Freq: Every day | ORAL | Status: DC
  Administered 2015-10-18: 5 mg via ORAL
  Filled 2015-10-17: qty 1

## 2015-10-17 MED ORDER — PANTOPRAZOLE SODIUM 40 MG PO TBEC: 40.0000 mg | DELAYED_RELEASE_TABLET | Freq: Every day | ORAL | Status: DC

## 2015-10-17 MED ORDER — ONDANSETRON HCL 4 MG/2ML IJ SOLN: 4.0000 mg | Freq: Four times a day (QID) | INTRAMUSCULAR | Status: DC | PRN

## 2015-10-17 MED ORDER — IOPAMIDOL (ISOVUE-370) INJECTION 76%
INTRAVENOUS | Status: AC
  Administered 2015-10-17: 80 mL
  Filled 2015-10-17: qty 100

## 2015-10-17 MED ORDER — FERROUS SULFATE 325 (65 FE) MG PO TABS
325.0000 mg | ORAL_TABLET | Freq: Every day | ORAL | Status: DC
  Administered 2015-10-18: 325 mg via ORAL
  Filled 2015-10-17: qty 1

## 2015-10-17 MED ORDER — CLOPIDOGREL BISULFATE 75 MG PO TABS: 75.0000 mg | ORAL_TABLET | Freq: Every day | ORAL | Status: DC

## 2015-10-17 MED ORDER — ATENOLOL 25 MG PO TABS
12.5000 mg | ORAL_TABLET | Freq: Two times a day (BID) | ORAL | Status: DC
  Administered 2015-10-17: 12.5 mg via ORAL
  Filled 2015-10-17: qty 1

## 2015-10-17 MED ORDER — TRIMETHOPRIM 100 MG PO TABS
100.0000 mg | ORAL_TABLET | Freq: Every evening | ORAL | Status: DC
  Administered 2015-10-17 – 2015-10-18 (×2): 100 mg via ORAL
  Filled 2015-10-17 (×2): qty 1

## 2015-10-17 NOTE — ED Notes (Signed)
Pt here with CP staring 2.5 hours ago. Pt also reports SOB, weakness. Pt has previous stroke with right deficits and expressive aphasia. Pt was given nitro PTA- BP dropped from 140/60 to 90/50. Pt was also given 324 ASA and 266ml NS. Pt accompanied by husband.

## 2015-10-17 NOTE — Consult Note (Deleted)
Note created in error.

## 2015-10-17 NOTE — H&P (Addendum)
History and Physical    Kristin [name] [id number] DOB: 10/24/1940 DOA: 10/17/2015  PCP: Kristin Lopes, MD   Patient coming from: Home  Chief Complaint: chest pain   HPI: Kristin [name] is a 75 y.o. female with known hx of stroke in 2001 that left her aphasic and with right spastic hemiplegia, presented to [facility] ED with main concern of sudden onset of substernal area chest pain that occurred earlier in the day. Please note that family at bedside (Daughter and husband provided most of the information due to pt's aphasia). Pt was apparently in her usual state of health until earlier today when this pain started. Pt says it came on suddenly and felt like heaviness in her chest. 7/10 in severity and occasionally but not consistently radiating to her left jaw and head, causing some pressure in the head. Pt denies any specific alleviating or aggravating factors. Pt denies similar events in the past and family at bedside says that pt has had some episodes in the recent past similar to this one but pt does not tend to complain about it. Daughter reports pt underwent EGD and colonoscopy almost two weeks ago for evaluation of dysphagia, odynophagia, iron deficiency anemia. She was started on Fluconazole for presumptive candidal esophagitis. Pt reports ongoing intermittent dysphagic to liquids and solids but says this has been going on for some time. She explains that her chest pain is not the same as discomfort from dysphagia. Pt denies fevers, chills, no abd or urinary concerns, no recent sick contacts or exposures, no changes in vision (has blurry vision at baseline), no new focal neurological symptoms such as weakness or numbness in upper or lower extremities, no recent changes in medications.   ED Course: Pt hemodynamically stable, VSS, blood work unremarkable except for mild bradycardia and HR in 50's. Currently no chest pain. THR asked to admit for further evaluation. Cardiology consulted.   Review of  Systems:  Constitutional: Negative for fever, chills, diaphoresis, activity change, appetite change.  HENT: Negative for ear pain, nosebleeds, congestion, facial swelling, neck stiffness and ear discharge.   Eyes: Negative for pain, discharge, redness, itching and visual disturbance.  Respiratory: Negative for cough, choking, chest tightness, shortness of breath, wheezing and stridor.   Cardiovascular: Negative for palpitations and leg swelling.  Gastrointestinal: Negative for abdominal distention.  Genitourinary: Negative for dysuria, urgency, frequency, hematuria, flank pain, decreased urine volume.  Musculoskeletal: Negative for back pain, joint swelling, arthralgias and gait problem.  Neurological: Negative for dizziness, tremors, seizures, light-headedness, numbness and headaches.  Hematological: Negative for adenopathy. Does not bruise/bleed easily.  Psychiatric/Behavioral: Negative for hallucinations, behavioral problems, confusion, dysphoric mood, decreased concentration and agitation.   Past Medical History  Diagnosis Date  . Stroke [facility]) 2001    reesultant Rt spastic hemplegia and aphasia   . Essential hypertension   . CAD (coronary artery disease)     a. 2010: stents pRCA and dRCA. b. LHC 12/13/13: Minimal distal LM dz. LAD 95% ostial w/ L-->R collat. LCx mild dz. RCA 95% mid-dRCA. Patent stents proximal and distal RCA. EF not assessed, Nl by nuclear study.     . Ear pain     right  . Hyperlipidemia with target LDL less than 70   . Central retinal vein occlusion, right eye   . Diplopia   . Eosinophilic esophagitis   . Incontinence     urine  . GERD (gastroesophageal reflux disease)   . Arthritis     hands  . Depression   .  H/O: whooping cough     Past Surgical History  Procedure Laterality Date  . Abdominal hysterectomy    . Carotid stent    . Lasix    . Cataracts    . Retina injection    . Esophagogastroduodenoscopy    . Coronary artery bypass graft N/A 12/17/2013     Procedure: CORONARY ARTERY BYPASS GRAFTING (CABG);  Surgeon: Gaye Pollack, MD;  Location: [facility];  Service: Open Heart Surgery;  Laterality: N/A;  Times 2 using left internal mammary artery and endoscopically harvested right saphenous vein  . Intraoperative transesophageal echocardiogram N/A 12/17/2013    Procedure: INTRAOPERATIVE TRANSESOPHAGEAL ECHOCARDIOGRAM;  Surgeon: Gaye Pollack, MD;  Location: [facility] OR;  Service: Open Heart Surgery;  Laterality: N/A;  . Left heart catheterization with coronary angiogram N/A 12/13/2013    Procedure: LEFT HEART CATHETERIZATION WITH CORONARY ANGIOGRAM;  Surgeon: Jettie Booze, MD;  Location: [facility] CATH LAB;  Service: Cardiovascular;  Laterality: N/A;  . Colonoscopy N/A 10/05/2015    Procedure: COLONOSCOPY;  Surgeon: Manus Gunning, MD;  Location: [facility];  Service: Gastroenterology;  Laterality: N/A;  . Esophagogastroduodenoscopy N/A 10/05/2015    Procedure: ESOPHAGOGASTRODUODENOSCOPY (EGD);  Surgeon: Manus Gunning, MD;  Location: [facility];  Service: Gastroenterology;  Laterality: N/A;     reports that she has never smoked. She has never used smokeless tobacco. She reports that she drinks alcohol. She reports that she does not use illicit drugs.  No Known Allergies  Family History  Problem Relation Age of Onset  . Diabetes Mother   . Heart disease Mother   . Heart attack Father   . Breast cancer Sister     Medication Sig  aspirin 81 MG tablet Take 81 mg by mouth daily.  atenolol (TENORMIN) 25 MG tablet Take 12.5 mg by mouth 2 (two) times daily.  atorvastatin (LIPITOR) 40 MG tablet Take 80 mg by mouth daily at 6 PM.   clopidogrel (PLAVIX) 75 MG tablet Take 75 mg by mouth daily.  diclofenac sodium (VOLTAREN) 1 % GEL Apply 4 g topically 4 (four) times daily as needed (pain).   ferrous sulfate 325 (65 FE) MG tablet Take 1 tablet (325 mg total) by mouth daily with breakfast.  fluconazole (DIFLUCAN) 200 MG tablet 2 tabs po on  day 1, then 1 tab daily for 13 more days Patient taking differently: Take 200 mg by mouth See admin instructions. 2 tabs po on day 1, then 1 tab daily for 13 more days  omeprazole (PRILOSEC) 20 MG capsule Take 20 mg by mouth daily.  oxybutynin (DITROPAN) 5 MG tablet Take 5 mg by mouth daily.  pregabalin (LYRICA) 150 MG capsule Take 150 mg by mouth 2 (two) times daily.   sertraline (ZOLOFT) 50 MG tablet Take 75 mg by mouth every evening.   trimethoprim (TRIMPEX) 100 MG tablet Take 100 mg by mouth every evening.     Physical Exam: Filed Vitals:   10/17/15 1700 10/17/15 1739 10/17/15 1740 10/17/15 1745  BP: 126/73 131/73  124/76  Pulse: 51 57 57 56  Temp:      TempSrc:      Resp: 16  17 16   Height:      Weight:      SpO2: 94% 98% 97% 97%    Constitutional: NAD, calm, comfortable Filed Vitals:   10/17/15 1700 10/17/15 1739 10/17/15 1740 10/17/15 1745  BP: 126/73 131/73  124/76  Pulse: 51 57 57 56  Temp:  TempSrc:      Resp: 16  17 16   Height:      Weight:      SpO2: 94% 98% 97% 97%   Eyes: PERRL, lids and conjunctivae normal ENMT: Mucous membranes are moist. Posterior pharynx clear of any exudate or lesions.Normal dentition.  Neck: normal, supple, no masses, no thyromegaly Respiratory: clear to auscultation bilaterally, no wheezing, no crackles. Normal respiratory effort. Diminished breath sounds at bases  Cardiovascular: Regular rate and rhythm, no murmurs / rubs / gallops. No extremity edema. RLE with trace edema, no edema in LLE Abdomen: no tenderness, no masses palpated. No hepatosplenomegaly. Bowel sounds positive.  Musculoskeletal: no clubbing / cyanosis. No joint deformity upper and lower extremities. Good ROM, no contractures. Normal muscle tone.  Skin: no rashes, lesions, ulcers. No induration Neurologic: CN 2-12 grossly intact. Sensation intact, strength 5/5 in left upper and lower extremity, able to move right foot side to side only, moves right shoulder up and  down only, aphasic. Follows commands appropriately  Psychiatric: Normal judgment and insight. Alert and oriented x 3. Normal mood.    Ulcers Indwelling cath  Labs on Admission: I have personally reviewed following labs and imaging studies  CBC:  Recent Labs Lab 10/17/15 1541  WBC 5.6  HGB 11.7*  HCT 38.6  MCV 76.7*  PLT 0000000   Basic Metabolic Panel:  Recent Labs Lab 10/17/15 1541  NA 138  K 4.0  CL 107  CO2 23  GLUCOSE 122*  BUN 16  CREATININE 0.79  CALCIUM 8.9    Radiological Exams on Admission: Dg Chest 2 View 10/17/2015  No active cardiopulmonary disease.  Ct Angio Chest Pe W/cm &/or Wo Cm 10/17/2015   No evidence of pulmonary emboli. Aortic atherosclerotic change. No acute abnormality is noted.   EKG: NRS, no acute ST segment changes  Assessment/Plan Chest pain - unclear etiology at this time, pt currently denies chest pain - so far, troponin negative, no acute findings on EKG - besides mild bradycardia, VS are stable - admit to tele unit - cycle CE's, d/w cardiology further plan, ? Plan for Myoview in AM - plan discharge back to home pending myoview results   Bradycardia - on atenolol 12.5 mg PO BID at home - may need to hold temporarily until HR stabilizes   Hx of stroke - with residual deficit outlined above - no acute neurological findings - continue aspirin and plavix   Candidal esophagitis - on recent EGD - continue home regimen with Fluconazole   HLD - continue statin   DVT prophylaxis: Lovenox SQ Code Status: Full Family Communication: Pt updated at bedside, daughter and husband at bedside as well  Disposition Plan: Home once cleared by cardiology team  Consults called: Cardiology  Admission status: Observation   Faye Ramsay MD [facility] Pager [phone]  If 7PM-7AM, please contact night-coverage [email or web address] Password [facility]  10/17/2015, 6:06 PM

## 2015-10-17 NOTE — ED Notes (Signed)
Admitting at bedside 

## 2015-10-17 NOTE — ED Provider Notes (Signed)
CSN: [id number]     Arrival date & time 10/17/15  1447 History   First MD Initiated Contact with Patient 10/17/15 1513     Chief Complaint  Patient presents with  . Chest Pain    HPI Comments: 75 year old female who presents with chest pain since this morning. PMH significant for left-sided MCA CVA (on Plavix) with residual right sided weakness and aphasia and CAD s/p CABG in 2015 and stent placement in 2011. HTN. History is limited due to patient's aphasia. The patient states the the chest pain started acutely this morning. It was substernal and radiated to her jaw. It was constant. It felt like the chest pain she had in the past requiring intervention. EMS was called to the scene and administered Nitro and ASA which has relieved her CP. She reports associated SOB and dysphagia. Denies fever, chills, cough, abdominal pain, N/V. Last stress test and cath was in 2015 which led to her CABG.  She has not seen her cardiologist in over 2 years. Dr. Cyndia Bent did CABG. Echo in 2015 showed EF 65%  Patient is a 75 y.o. female presenting with chest pain.  Chest Pain Associated symptoms: shortness of breath   Associated symptoms: no abdominal pain, no cough, no fever, no nausea and not vomiting     Past Medical History  Diagnosis Date  . Stroke [facility]) 2001    reesultant Rt spastic hemplegia and aphasia   . Essential hypertension   . CAD (coronary artery disease)     a. 2010: stents pRCA and dRCA. b. LHC 12/13/13: Minimal distal LM dz. LAD 95% ostial w/ L-->R collat. LCx mild dz. RCA 95% mid-dRCA. Patent stents proximal and distal RCA. EF not assessed, Nl by nuclear study.     . Ear pain     right  . Hyperlipidemia with target LDL less than 70   . Central retinal vein occlusion, right eye   . Diplopia   . Eosinophilic esophagitis   . Incontinence     urine  . GERD (gastroesophageal reflux disease)   . Arthritis     hands  . Depression   . H/O: whooping cough    Past Surgical History  Procedure  Laterality Date  . Abdominal hysterectomy    . Carotid stent    . Lasix    . Cataracts    . Retina injection    . Esophagogastroduodenoscopy    . Coronary artery bypass graft N/A 12/17/2013    Procedure: CORONARY ARTERY BYPASS GRAFTING (CABG);  Surgeon: Gaye Pollack, MD;  Location: [place];  Service: Open Heart Surgery;  Laterality: N/A;  Times 2 using left internal mammary artery and endoscopically harvested right saphenous vein  . Intraoperative transesophageal echocardiogram N/A 12/17/2013    Procedure: INTRAOPERATIVE TRANSESOPHAGEAL ECHOCARDIOGRAM;  Surgeon: Gaye Pollack, MD;  Location: [facility] OR;  Service: Open Heart Surgery;  Laterality: N/A;  . Left heart catheterization with coronary angiogram N/A 12/13/2013    Procedure: LEFT HEART CATHETERIZATION WITH CORONARY ANGIOGRAM;  Surgeon: Jettie Booze, MD;  Location: [facility] CATH LAB;  Service: Cardiovascular;  Laterality: N/A;  . Colonoscopy N/A 10/05/2015    Procedure: COLONOSCOPY;  Surgeon: Manus Gunning, MD;  Location: [facility] ENDOSCOPY;  Service: Gastroenterology;  Laterality: N/A;  . Esophagogastroduodenoscopy N/A 10/05/2015    Procedure: ESOPHAGOGASTRODUODENOSCOPY (EGD);  Surgeon: Manus Gunning, MD;  Location: [facility];  Service: Gastroenterology;  Laterality: N/A;   Family History  Problem Relation Age of Onset  . Diabetes  Mother   . Heart disease Mother   . Heart attack Father   . Breast cancer Sister    Social History  Substance Use Topics  . Smoking status: Never Smoker   . Smokeless tobacco: Never Used  . Alcohol Use: 0.0 oz/week    0 Standard drinks or equivalent per week     Comment: glass of wine a day   OB History    No data available     Review of Systems  Constitutional: Negative for fever and chills.  Respiratory: Positive for shortness of breath. Negative for cough.   Cardiovascular: Positive for chest pain.  Gastrointestinal: Negative for nausea, vomiting, abdominal pain and diarrhea.  All  other systems reviewed and are negative.     Allergies  Review of patient's allergies indicates no known allergies.  Home Medications   Prior to Admission medications   Medication Sig Start Date End Date Taking? Authorizing Provider  aspirin 81 MG tablet Take 81 mg by mouth daily.   Yes Historical Provider, MD  atenolol (TENORMIN) 25 MG tablet Take 12.5 mg by mouth 2 (two) times daily.   Yes Historical Provider, MD  atorvastatin (LIPITOR) 40 MG tablet Take 80 mg by mouth daily at 6 PM.    Yes Historical Provider, MD  clopidogrel (PLAVIX) 75 MG tablet Take 75 mg by mouth daily.   Yes Historical Provider, MD  diclofenac sodium (VOLTAREN) 1 % GEL Apply 4 g topically 4 (four) times daily as needed (pain).    Yes Historical Provider, MD  ferrous sulfate 325 (65 FE) MG tablet Take 1 tablet (325 mg total) by mouth daily with breakfast. 09/08/15  Yes Manus Gunning, MD  fluconazole (DIFLUCAN) 200 MG tablet 2 tabs po on day 1, then 1 tab daily for 13 more days Patient taking differently: Take 200 mg by mouth See admin instructions. 2 tabs po on day 1, then 1 tab daily for 13 more days 10/06/15  Yes Manus Gunning, MD  omeprazole (PRILOSEC) 20 MG capsule Take 20 mg by mouth daily.   Yes Historical Provider, MD  oxybutynin (DITROPAN) 5 MG tablet Take 5 mg by mouth daily.   Yes Historical Provider, MD  pregabalin (LYRICA) 150 MG capsule Take 150 mg by mouth 2 (two) times daily.    Yes Historical Provider, MD  sertraline (ZOLOFT) 50 MG tablet Take 75 mg by mouth every evening.    Yes Historical Provider, MD  trimethoprim (TRIMPEX) 100 MG tablet Take 100 mg by mouth every evening.    Yes Historical Provider, MD   BP 110/73 mmHg  Pulse 52  Temp(Src) 97.9 F (36.6 C) (Oral)  Resp 24  Ht 5\' 7"  (1.702 m)  Wt 84.823 kg  BMI 29.28 kg/m2  SpO2 91%   Physical Exam  Constitutional: She is oriented to person, place, and time. She appears well-developed and well-nourished. No distress.   HENT:  Head: Normocephalic and atraumatic.  Eyes: Conjunctivae are normal. Pupils are equal, round, and reactive to light. Right eye exhibits no discharge. Left eye exhibits no discharge. No scleral icterus.  Neck: Normal range of motion.  Cardiovascular: Normal rate and regular rhythm.  Exam reveals no gallop and no friction rub.   No murmur heard. Pulmonary/Chest: Effort normal. No respiratory distress. She has wheezes. She has no rales. She exhibits no tenderness.  Faint expiratory wheezes  Abdominal: Soft. Bowel sounds are normal. She exhibits no distension and no mass. There is no tenderness. There is no rebound  and no guarding.  Neurological: She is alert and oriented to person, place, and time.  Skin: Skin is warm and dry.  Psychiatric: She has a normal mood and affect. Her behavior is normal.    ED Course  Procedures (including critical care time) Labs Review Labs Reviewed  BASIC METABOLIC PANEL - Abnormal; Notable for the following:    Glucose, Bld 122 (*)    All other components within normal limits  CBC - Abnormal; Notable for the following:    Hemoglobin 11.7 (*)    MCV 76.7 (*)    MCH 23.3 (*)    RDW 19.7 (*)    All other components within normal limits  I-STAT TROPOININ, ED    Imaging Review Dg Chest 2 View  10/17/2015  CLINICAL DATA:  Chest pain starting 2-3 hours ago. Shortness of breath. EXAM: CHEST  2 VIEW COMPARISON:  03/21/2014 FINDINGS: Prior CABG. Heart is borderline in size. No confluent airspace opacities or effusions. No acute bony abnormality. IMPRESSION: No active cardiopulmonary disease. Electronically Signed   By: Rolm Baptise M.D.   On: 10/17/2015 15:30   Ct Angio Chest Pe W/cm &/or Wo Cm  10/17/2015  CLINICAL DATA:  Shortness of breath and chest pain for several hours EXAM: CT ANGIOGRAPHY CHEST WITH CONTRAST TECHNIQUE: Multidetector CT imaging of the chest was performed using the standard protocol during bolus administration of intravenous contrast.  Multiplanar CT image reconstructions and MIPs were obtained to evaluate the vascular anatomy. CONTRAST:  80 mL Isovue 370. COMPARISON:  Plain film from earlier in the same day. FINDINGS: Cardiovascular: The thoracic aorta and its branches are within normal limits. Changes of prior coronary bypass grafting are noted. Coronary calcifications are seen. Aortic calcifications are noted as well. The pulmonary artery shows a normal branching pattern without evidence of intraluminal filling defect to suggest pulmonary emboli. Cardiac structures are within normal limits. Mediastinum/Nodes: No significant hilar or mediastinal adenopathy is noted. Postsurgical changes are seen. Lungs/Pleura: The lungs are well aerated bilaterally. Minimal bibasilar scarring is seen. No focal infiltrate or sizable effusion is noted. No pneumothorax is seen. Upper Abdomen: Within normal limits. Musculoskeletal: Degenerative changes of the thoracic spine are noted. No acute bony abnormality is seen. Review of the MIP images confirms the above findings. IMPRESSION: No evidence of pulmonary emboli. Aortic atherosclerotic change. No acute abnormality is noted. Electronically Signed   By: Inez Catalina M.D.   On: 10/17/2015 17:37   I have personally reviewed and evaluated these images and lab results as part of my medical decision-making.   EKG Interpretation None      MDM   Final diagnoses:  Chest pain syndrome   75 year old female presents with acute onset of chest pain. She is bradycardic and mildly hypertensive at times but vitals are otherwise normal. Chest pain workup has been negative. EKG is NSR. Troponin is 0. CXR is clear. CTA of chest is negative. Labs are overall unremarkable. I do feel there is concern for ACS as she has many risk factors with a prior CVA and CABG without cardiology follow up. Heart score is 5. Spoke with Dr. Jenna Luo who will see patient in consult. Asked Dr. Doyle Askew to admit for cardiac r/o.   Pipper Underhill, PA-C 10/17/15 1940  Gareth Morgan, MD 10/22/15 1501

## 2015-10-17 NOTE — ED Notes (Signed)
Attempted report 

## 2015-10-17 NOTE — Consult Note (Signed)
CARDIOLOGY CONSULT NOTE  Patient ID: Kristin [name], MRN: [id number], DOB/AGE: March 05, 1941 75 y.o. Admit date: 10/17/2015 Date of Consult: 10/17/2015  Primary Physician: Donnajean Lopes, MD Primary Cardiologist: Mare Ferrari  Referring Physician: Dr Doyle Askew  Chief Complaint: Chest pain Reason for Consultation: Chest pain  HPI: 46 yo woman, mother-in-law of Dr Carlean Purl, with hx of CAD s/p PCI and CABG presents with chest pain. She has had remote PCI of the RCA and ultimately required CABG in 2015 after presenting with unstable angina. She was found to have severe ostial LAD stenosis and severe diffuse RCA stenosis, so she was referred for cardiac surgery and underwent CABG with a LIMA-LAD and SVG-PDA. She had done well until today when she developed chest pain with walking. Admits that she recently has had CP with ambulation, but history is extremely difficult. The patient had a stroke in 2001 and she has severe expressive aphasia. She had not told her family about chest pain until today. Pain appears to be central and left-sided, radiating to left arm. There is occasional shortness of breath. No other complaints. No pain at the time of my interview. Family at bedside helps with history.   Medical History:  Past Medical History  Diagnosis Date  . Stroke [facility]) 2001    reesultant Rt spastic hemplegia and aphasia   . Essential hypertension   . CAD (coronary artery disease)     a. 2010: stents pRCA and dRCA. b. LHC 12/13/13: Minimal distal LM dz. LAD 95% ostial w/ L-->R collat. LCx mild dz. RCA 95% mid-dRCA. Patent stents proximal and distal RCA. EF not assessed, Nl by nuclear study.     . Ear pain     right  . Hyperlipidemia with target LDL less than 70   . Central retinal vein occlusion, right eye   . Diplopia   . Eosinophilic esophagitis   . Incontinence     urine  . GERD (gastroesophageal reflux disease)   . Arthritis     hands  . Depression   . H/O: whooping cough       Surgical History:    Past Surgical History  Procedure Laterality Date  . Abdominal hysterectomy    . Carotid stent    . Lasix    . Cataracts    . Retina injection    . Esophagogastroduodenoscopy    . Coronary artery bypass graft N/A 12/17/2013    Procedure: CORONARY ARTERY BYPASS GRAFTING (CABG);  Surgeon: Gaye Pollack, MD;  Location: [place];  Service: Open Heart Surgery;  Laterality: N/A;  Times 2 using left internal mammary artery and endoscopically harvested right saphenous vein  . Intraoperative transesophageal echocardiogram N/A 12/17/2013    Procedure: INTRAOPERATIVE TRANSESOPHAGEAL ECHOCARDIOGRAM;  Surgeon: Gaye Pollack, MD;  Location: [facility] OR;  Service: Open Heart Surgery;  Laterality: N/A;  . Left heart catheterization with coronary angiogram N/A 12/13/2013    Procedure: LEFT HEART CATHETERIZATION WITH CORONARY ANGIOGRAM;  Surgeon: Jettie Booze, MD;  Location: [facility] CATH LAB;  Service: Cardiovascular;  Laterality: N/A;  . Colonoscopy N/A 10/05/2015    Procedure: COLONOSCOPY;  Surgeon: Manus Gunning, MD;  Location: [facility] ENDOSCOPY;  Service: Gastroenterology;  Laterality: N/A;  . Esophagogastroduodenoscopy N/A 10/05/2015    Procedure: ESOPHAGOGASTRODUODENOSCOPY (EGD);  Surgeon: Manus Gunning, MD;  Location: [facility];  Service: Gastroenterology;  Laterality: N/A;     Home Meds: Prior to Admission medications   Medication Sig Start Date End Date Taking? Authorizing Provider  aspirin 81 MG tablet  Take 81 mg by mouth daily.   Yes Historical Provider, MD  atenolol (TENORMIN) 25 MG tablet Take 12.5 mg by mouth 2 (two) times daily.   Yes Historical Provider, MD  atorvastatin (LIPITOR) 40 MG tablet Take 80 mg by mouth daily at 6 PM.    Yes Historical Provider, MD  clopidogrel (PLAVIX) 75 MG tablet Take 75 mg by mouth daily.   Yes Historical Provider, MD  diclofenac sodium (VOLTAREN) 1 % GEL Apply 4 g topically 4 (four) times daily as needed (pain).    Yes Historical Provider, MD  ferrous  sulfate 325 (65 FE) MG tablet Take 1 tablet (325 mg total) by mouth daily with breakfast. 09/08/15  Yes Manus Gunning, MD  fluconazole (DIFLUCAN) 200 MG tablet 2 tabs po on day 1, then 1 tab daily for 13 more days Patient taking differently: Take 200 mg by mouth See admin instructions. 2 tabs po on day 1, then 1 tab daily for 13 more days 10/06/15  Yes Manus Gunning, MD  omeprazole (PRILOSEC) 20 MG capsule Take 20 mg by mouth daily.   Yes Historical Provider, MD  oxybutynin (DITROPAN) 5 MG tablet Take 5 mg by mouth daily.   Yes Historical Provider, MD  pregabalin (LYRICA) 150 MG capsule Take 150 mg by mouth 2 (two) times daily.    Yes Historical Provider, MD  sertraline (ZOLOFT) 50 MG tablet Take 75 mg by mouth every evening.    Yes Historical Provider, MD  trimethoprim (TRIMPEX) 100 MG tablet Take 100 mg by mouth every evening.    Yes Historical Provider, MD    Inpatient Medications:     Allergies: No Known Allergies  Social History   Social History  . Marital Status: Married    Spouse Name: N/A  . Number of Children: 2  . Years of Education: N/A   Occupational History  . retired Education officer, [employer]    Social History Main Topics  . Smoking status: Never Smoker   . Smokeless tobacco: Never Used  . Alcohol Use: 0.0 oz/week    0 Standard drinks or equivalent per week     Comment: glass of wine a day  . Drug Use: No  . Sexual Activity: No   Other Topics Concern  . Not on file   Social History Narrative     Family History  Problem Relation Age of Onset  . Diabetes Mother   . Heart disease Mother   . Heart attack Father   . Breast cancer Sister      Review of Systems: General: negative for chills, fever, night sweats or weight changes.  ENT: negative for rhinorrhea or epistaxis Cardiovascular: edema, orthopnea, palpitations, or paroxysmal nocturnal dyspnea Dermatological: negative for rash Respiratory: negative for cough or wheezing GI: negative for  nausea, vomiting, diarrhea, bright red blood per rectum, melena, or hematemesis GU: no hematuria, urgency, or frequency Neurologic: negative for visual changes, syncope, headache, or dizziness Heme: no easy bruising or bleeding Endo: negative for excessive thirst, thyroid disorder, or flushing Musculoskeletal: negative for joint pain or swelling, negative for myalgias  All other systems reviewed and are otherwise negative except as noted above.  Physical Exam: Blood pressure 124/76, pulse 56, temperature 97.9 F (36.6 C), temperature source Oral, resp. rate 16, height 5\' 7"  (1.702 m), weight 187 lb (84.823 kg), SpO2 97 %. Pt is alert and oriented, WD, WN, in no distress. HEENT: normal, expressive aphasia - severe Neck: JVP normal. Carotid upstrokes normal without bruits. No  thyromegaly. Lungs: equal expansion, clear bilaterally CV: Apex is discrete and nondisplaced, RRR without murmur or gallop Abd: soft, NT, +BS, no bruit, no hepatosplenomegaly Back: no CVA tenderness Ext: no C/C/E        DP/PT pulses intact and = Skin: warm and dry without rash Neuro: CNII-XII intact             Strength intact = bilaterally    Labs: No results for input(s): CKTOTAL, CKMB, TROPONINI in the last 72 hours. Lab Results  Component Value Date   WBC 5.6 10/17/2015   HGB 11.7* 10/17/2015   HCT 38.6 10/17/2015   MCV 76.7* 10/17/2015   PLT 204 10/17/2015    Recent Labs Lab 10/17/15 1541  NA 138  K 4.0  CL 107  CO2 23  BUN 16  CREATININE 0.79  CALCIUM 8.9  GLUCOSE 122*   No results found for: CHOL, HDL, LDLCALC, TRIG No results found for: DDIMER  Radiology/Studies:  Dg Chest 2 View  10/17/2015  CLINICAL DATA:  Chest pain starting 2-3 hours ago. Shortness of breath. EXAM: CHEST  2 VIEW COMPARISON:  03/21/2014 FINDINGS: Prior CABG. Heart is borderline in size. No confluent airspace opacities or effusions. No acute bony abnormality. IMPRESSION: No active cardiopulmonary disease.  Electronically Signed   By: Rolm Baptise M.D.   On: 10/17/2015 15:30   Ct Angio Chest Pe W/cm &/or Wo Cm  10/17/2015  CLINICAL DATA:  Shortness of breath and chest pain for several hours EXAM: CT ANGIOGRAPHY CHEST WITH CONTRAST TECHNIQUE: Multidetector CT imaging of the chest was performed using the standard protocol during bolus administration of intravenous contrast. Multiplanar CT image reconstructions and MIPs were obtained to evaluate the vascular anatomy. CONTRAST:  80 mL Isovue 370. COMPARISON:  Plain film from earlier in the same day. FINDINGS: Cardiovascular: The thoracic aorta and its branches are within normal limits. Changes of prior coronary bypass grafting are noted. Coronary calcifications are seen. Aortic calcifications are noted as well. The pulmonary artery shows a normal branching pattern without evidence of intraluminal filling defect to suggest pulmonary emboli. Cardiac structures are within normal limits. Mediastinum/Nodes: No significant hilar or mediastinal adenopathy is noted. Postsurgical changes are seen. Lungs/Pleura: The lungs are well aerated bilaterally. Minimal bibasilar scarring is seen. No focal infiltrate or sizable effusion is noted. No pneumothorax is seen. Upper Abdomen: Within normal limits. Musculoskeletal: Degenerative changes of the thoracic spine are noted. No acute bony abnormality is seen. Review of the MIP images confirms the above findings. IMPRESSION: No evidence of pulmonary emboli. Aortic atherosclerotic change. No acute abnormality is noted. Electronically Signed   By: Inez Catalina M.D.   On: 10/17/2015 17:37    EKG: sinus rhythm 62 bpm, within normal limits  Cardiac Studies: Pending, troponin (-)  ASSESSMENT AND PLAN:  1. Chest pain, with exertion, now resolved. Initial markers and EKG within normal limits. CTA chest reviewed and now major abnormalities noted. Plan [employer] tomorrow. History is limited by expressive aphasia. Family and patient  agreeable with plan - orders written  2. CAD, s/p CABG: as above. Continue current Rx. No heparin unless troponin positive or recurrent pain.  3. Expressive aphasia post-stroke: stable per family  Continue ASA, plavix, beta-blocker, statin. Further disposition pending myoview result.   SignedSherren Mocha MD, [facility] 10/17/2015, 6:43 PM

## 2015-10-18 ENCOUNTER — Observation Stay (HOSPITAL_COMMUNITY): Payer: Medicare Other

## 2015-10-18 DIAGNOSIS — G8191 Hemiplegia, unspecified affecting right dominant side: Secondary | ICD-10-CM | POA: Diagnosis not present

## 2015-10-18 DIAGNOSIS — R001 Bradycardia, unspecified: Secondary | ICD-10-CM

## 2015-10-18 DIAGNOSIS — R471 Dysarthria and anarthria: Secondary | ICD-10-CM

## 2015-10-18 DIAGNOSIS — R079 Chest pain, unspecified: Secondary | ICD-10-CM | POA: Diagnosis not present

## 2015-10-18 DIAGNOSIS — Z8673 Personal history of transient ischemic attack (TIA), and cerebral infarction without residual deficits: Secondary | ICD-10-CM

## 2015-10-18 DIAGNOSIS — D509 Iron deficiency anemia, unspecified: Secondary | ICD-10-CM

## 2015-10-18 LAB — BASIC METABOLIC PANEL
Anion gap: 7 (ref 5–15)
BUN: 15 mg/dL (ref 6–20)
CO2: 25 mmol/L (ref 22–32)
Calcium: 8.5 mg/dL — ABNORMAL LOW (ref 8.9–10.3)
Chloride: 107 mmol/L (ref 101–111)
Creatinine, Ser: 0.83 mg/dL (ref 0.44–1.00)
GFR calc Af Amer: >60 mL/min (ref >60)
GFR calc non Af Amer: >60 mL/min (ref >60)
Glucose, Bld: 109 mg/dL — ABNORMAL HIGH (ref 65–99)
Potassium: 3.7 mmol/L (ref 3.5–5.1)
Sodium: 139 mmol/L (ref 135–145)

## 2015-10-18 LAB — NM MYOCAR MULTI W/SPECT W/WALL MOTION / EF
Estimated workload: 1 METS
MPHR: 143 {beats}/min
Peak HR: 80 {beats}/min
Percent HR: 55 %
Rest HR: 46 {beats}/min

## 2015-10-18 LAB — CBC
HCT: 38.5 % (ref 36.0–46.0)
Hemoglobin: 11.4 g/dL — ABNORMAL LOW (ref 12.0–15.0)
MCH: 22.9 pg — ABNORMAL LOW (ref 26.0–34.0)
MCHC: 29.6 g/dL — ABNORMAL LOW (ref 30.0–36.0)
MCV: 77.3 fL — ABNORMAL LOW (ref 78.0–100.0)
Platelets: 182 10*3/uL (ref 150–400)
RBC: 4.98 MIL/uL (ref 3.87–5.11)
RDW: 19.8 % — ABNORMAL HIGH (ref 11.5–15.5)
WBC: 5.8 10*3/uL (ref 4.0–10.5)

## 2015-10-18 LAB — TROPONIN I
Troponin I: <0.03 ng/mL (ref <0.03)
Troponin I: <0.03 ng/mL (ref <0.03)

## 2015-10-18 MED ORDER — TECHNETIUM TC 99M TETROFOSMIN IV KIT
30.0000 | PACK | Freq: Once | INTRAVENOUS | Status: AC | PRN
  Administered 2015-10-18: 30 via INTRAVENOUS

## 2015-10-18 MED ORDER — REGADENOSON 0.4 MG/5ML IV SOLN
INTRAVENOUS | Status: AC
  Filled 2015-10-18: qty 5

## 2015-10-18 MED ORDER — TECHNETIUM TC 99M TETROFOSMIN IV KIT
10.0000 | PACK | Freq: Once | INTRAVENOUS | Status: AC | PRN
  Administered 2015-10-18: 10 via INTRAVENOUS

## 2015-10-18 MED ORDER — REGADENOSON 0.4 MG/5ML IV SOLN
0.4000 mg | Freq: Once | INTRAVENOUS | Status: DC
  Filled 2015-10-18: qty 5

## 2015-10-18 MED ORDER — NITROGLYCERIN 0.4 MG SL SUBL: 0.4000 mg | SUBLINGUAL_TABLET | SUBLINGUAL | Status: DC | PRN

## 2015-10-18 NOTE — Discharge Summary (Signed)
Discharge Summary  Kristin [name] [id number] DOB: [birth year]  PCP: Donnajean Lopes, MD  Admit date: 10/17/2015 Discharge date: 10/18/2015  Time spent: <58mins  Recommendations for Outpatient Follow-up:  1. F/u with PMD within a week  for hospital discharge follow up, repeat cbc/bmp at follow up 2. F/u with cardiology Dr Burt Knack in month for chest pain  Discharge Diagnoses:  Active Hospital Problems   Diagnosis Date Noted  . Chest pain syndrome   . Chest pain 10/17/2015    Resolved Hospital Problems   Diagnosis Date Noted Date Resolved  No resolved problems to display.    Discharge Condition: stable  Diet recommendation: heart healthy  Filed Weights   10/17/15 1455 10/17/15 1955  Weight: 84.823 kg (187 lb) 81.7 kg (180 lb 1.9 oz)    History of present illness:  Chief Complaint: chest pain   HPI: Kristin [name] is a 75 y.o. female with known hx of stroke in 2001 that left her aphasic and with right spastic hemiplegia, presented to [facility] ED with main concern of sudden onset of substernal area chest pain that occurred earlier in the day. Please note that family at bedside (Daughter and husband provided most of the information due to pt's aphasia). Pt was apparently in her usual state of health until earlier today when this pain started. Pt says it came on suddenly and felt like heaviness in her chest. 7/10 in severity and occasionally but not consistently radiating to her left jaw and head, causing some pressure in the head. Pt denies any specific alleviating or aggravating factors. Pt denies similar events in the past and family at bedside says that pt has had some episodes in the recent past similar to this one but pt does not tend to complain about it. Daughter reports pt underwent EGD and colonoscopy almost two weeks ago for evaluation of dysphagia, odynophagia, iron deficiency anemia. She was started on Fluconazole for presumptive candidal esophagitis. Pt reports ongoing intermittent  dysphagic to liquids and solids but says this has been going on for some time. She explains that her chest pain is not the same as discomfort from dysphagia. Pt denies fevers, chills, no abd or urinary concerns, no recent sick contacts or exposures, no changes in vision (has blurry vision at baseline), no new focal neurological symptoms such as weakness or numbness in upper or lower extremities, no recent changes in medications.   ED Course: Pt hemodynamically stable, VSS, blood work unremarkable except for mild bradycardia and HR in 50's. Currently no chest pain. THR asked to admit for further evaluation. Cardiology consulted.   Hospital Course:  Active Problems:   Chest pain   Chest pain syndrome   Chest pain - unclear etiology at this time, pt currently denies chest pain - so far, troponin negative, no acute findings on EKG - stress test 6/28 with questionable reversible ischemia, on statin, asa and plavix,  cardiology recommended medical management and cleared patient to discharge home and close outpatient cardiology follow up,  prn nitroglycerin tabs prescribed at discharge, family is agreeable to the discharge plan. They has no concerns about going back to previous ALF.  Bradycardia - on atenolol 12.5 mg PO BID at home which is discontinued per cardiology recommendation - outpatient cardiology followup  Hx of stroke - with residual dysarthria and right hemiparesis - no acute neurological findings - continue aspirin and plavix   Candidal esophagitis - on recent EGD - continue home regimen with Fluconazole   HLD - continue statin  Code Status: Full Family Communication: Patient and daughter in room, one of patient's daughter is a Librarian, academic Disposition Plan: return to ALF , cleared by cardiology team  Consults called: Cardiology    Procedures:  Stress test 6/28  Antibiotics:  none   Discharge Exam: BP 125/91 mmHg  Pulse 56  Temp(Src) 97.5 F (36.4  C) (Oral)  Resp 18  Ht 5\' 7"  (1.702 m)  Wt 81.7 kg (180 lb 1.9 oz)  BMI 28.20 kg/m2  SpO2 97%    General: NAD  Cardiovascular: RRR  Respiratory: CTABL  Abdomen: Soft/ND/NT, positive BS  Musculoskeletal: No Edema  Neuro: base line deficit from prior cva   Discharge Instructions You were cared for by a hospitalist during your hospital stay. If you have any questions about your discharge medications or the care you received while you were in the hospital after you are discharged, you can call the unit and asked to speak with the hospitalist on call if the hospitalist that took care of you is not available. Once you are discharged, your primary care physician will handle any further medical issues. Please note that NO REFILLS for any discharge medications will be authorized once you are discharged, as it is imperative that you return to your primary care physician (or establish a relationship with a primary care physician if you do not have one) for your aftercare needs so that they can reassess your need for medications and monitor your lab values.  Discharge Instructions    Diet - low sodium heart healthy    Complete by:  As directed      Increase activity slowly    Complete by:  As directed             Medication List    STOP taking these medications        atenolol 25 MG tablet  Commonly known as:  TENORMIN      TAKE these medications        aspirin 81 MG tablet  Take 81 mg by mouth daily.     atorvastatin 40 MG tablet  Commonly known as:  LIPITOR  Take 80 mg by mouth daily at 6 PM.     clopidogrel 75 MG tablet  Commonly known as:  PLAVIX  Take 75 mg by mouth daily.     diclofenac sodium 1 % Gel  Commonly known as:  VOLTAREN  Apply 4 g topically 4 (four) times daily as needed (pain).     ferrous sulfate 325 (65 FE) MG tablet  Take 1 tablet (325 mg total) by mouth daily with breakfast.     fluconazole 200 MG tablet  Commonly known as:  DIFLUCAN  2 tabs  po on day 1, then 1 tab daily for 13 more days     oxybutynin 5 MG tablet  Commonly known as:  DITROPAN  Take 5 mg by mouth daily.     pregabalin 150 MG capsule  Commonly known as:  LYRICA  Take 150 mg by mouth 2 (two) times daily.     PRILOSEC 20 MG capsule  Generic drug:  omeprazole  Take 20 mg by mouth daily.     sertraline 50 MG tablet  Commonly known as:  ZOLOFT  Take 75 mg by mouth every evening.     trimethoprim 100 MG tablet  Commonly known as:  TRIMPEX  Take 100 mg by mouth every evening.       No Known Allergies  Follow-up Information    Follow up with Donnajean Lopes, MD In 1 week.   Specialty:  Internal Medicine   Why:  hospital discharge follow up   Contact information:   [place] [address] [phone]       Follow up with Sherren Mocha, MD In 1 month.   Specialty:  Cardiology   Why:  hospital discharge follow up for chest pain   Contact information:   [address] [place] [address] [phone]        The results of significant diagnostics from this hospitalization (including imaging, microbiology, ancillary and laboratory) are listed below for reference.    Significant Diagnostic Studies: Dg Chest 2 View  10/17/2015  CLINICAL DATA:  Chest pain starting 2-3 hours ago. Shortness of breath. EXAM: CHEST  2 VIEW COMPARISON:  03/21/2014 FINDINGS: Prior CABG. Heart is borderline in size. No confluent airspace opacities or effusions. No acute bony abnormality. IMPRESSION: No active cardiopulmonary disease. Electronically Signed   By: Rolm Baptise M.D.   On: 10/17/2015 15:30   Ct Angio Chest Pe W/cm &/or Wo Cm  10/17/2015  CLINICAL DATA:  Shortness of breath and chest pain for several hours EXAM: CT ANGIOGRAPHY CHEST WITH CONTRAST TECHNIQUE: Multidetector CT imaging of the chest was performed using the standard protocol during bolus administration of intravenous contrast. Multiplanar CT image  reconstructions and MIPs were obtained to evaluate the vascular anatomy. CONTRAST:  80 mL Isovue 370. COMPARISON:  Plain film from earlier in the same day. FINDINGS: Cardiovascular: The thoracic aorta and its branches are within normal limits. Changes of prior coronary bypass grafting are noted. Coronary calcifications are seen. Aortic calcifications are noted as well. The pulmonary artery shows a normal branching pattern without evidence of intraluminal filling defect to suggest pulmonary emboli. Cardiac structures are within normal limits. Mediastinum/Nodes: No significant hilar or mediastinal adenopathy is noted. Postsurgical changes are seen. Lungs/Pleura: The lungs are well aerated bilaterally. Minimal bibasilar scarring is seen. No focal infiltrate or sizable effusion is noted. No pneumothorax is seen. Upper Abdomen: Within normal limits. Musculoskeletal: Degenerative changes of the thoracic spine are noted. No acute bony abnormality is seen. Review of the MIP images confirms the above findings. IMPRESSION: No evidence of pulmonary emboli. Aortic atherosclerotic change. No acute abnormality is noted. Electronically Signed   By: Inez Catalina M.D.   On: 10/17/2015 17:37   Nm Myocar Multi W/spect W/wall Motion / Ef  10/18/2015  CLINICAL DATA:  Chest pain.  prior CABG. EXAM: MYOCARDIAL IMAGING WITH SPECT (REST AND PHARMACOLOGIC-STRESS) GATED LEFT VENTRICULAR WALL MOTION STUDY LEFT VENTRICULAR EJECTION FRACTION TECHNIQUE: Standard myocardial SPECT imaging was performed after resting intravenous injection of 10 mCi Tc-59m tetrofosmin. Subsequently, intravenous infusion of Lexiscan was performed under the supervision of the Cardiology staff. At peak effect of the drug, 30 mCi Tc-60m tetrofosmin was injected intravenously and standard myocardial SPECT imaging was performed. Quantitative gated imaging was also performed to evaluate left ventricular wall motion, and estimate left ventricular ejection fraction.  COMPARISON:  12/10/2013 FINDINGS: Perfusion: There is decreased activity inferiorly, worsening on stress images concerning for inducible ischemia. Wall Motion: Decreased wall motion in the septum. Otherwise normal wall motion. Left Ventricular Ejection Fraction: 68 % End diastolic volume 58 ml End systolic volume 19 ml IMPRESSION: 1. Concern for reversible defect and inducible ischemia within the inferior wall. 2. Decreased grip all motion in the septum, likely related to prior CABG. Otherwise normal wall motion. 3. Left ventricular ejection  fraction 68% 4. Non invasive risk stratification*: Intermediate *2012 Appropriate Use Criteria for Coronary Revascularization Focused Update: J Am Coll Cardiol. [id number]. http://[email or web address] Electronically Signed   By: Rolm Baptise M.D.   On: 10/18/2015 14:52    Microbiology: No results found for this or any previous visit (from the past 240 hour(s)).   Labs: Basic Metabolic Panel:  Recent Labs Lab 10/17/15 1541 10/18/15 0231  NA 138 139  K 4.0 3.7  CL 107 107  CO2 23 25  GLUCOSE 122* 109*  BUN 16 15  CREATININE 0.79 0.83  CALCIUM 8.9 8.5*   Liver Function Tests: No results for input(s): AST, ALT, ALKPHOS, BILITOT, PROT, ALBUMIN in the last 168 hours. No results for input(s): LIPASE, AMYLASE in the last 168 hours. No results for input(s): AMMONIA in the last 168 hours. CBC:  Recent Labs Lab 10/17/15 1541 10/18/15 0231  WBC 5.6 5.8  HGB 11.7* 11.4*  HCT 38.6 38.5  MCV 76.7* 77.3*  PLT 204 182   Cardiac Enzymes:  Recent Labs Lab 10/17/15 2100 10/18/15 10/18/15 0231  TROPONINI <0.03 <0.03 <0.03   BNP: BNP (last 3 results) No results for input(s): BNP in the last 8760 hours.  ProBNP (last 3 results) No results for input(s): PROBNP in the last 8760 hours.  CBG: No results for input(s): GLUCAP in the last 168 hours.     SignedFlorencia Reasons MD, PhD  [facility] 10/18/2015, 4:50 PM

## 2015-10-18 NOTE — Progress Notes (Signed)
Hospital Problem List     Active Problems:   Chest pain    Patient Profile:   Primary Cardiologist: Dr. Mare Ferrari  75 yo female w/ PMH of CAD (s/p CABG in 2015 w/ LIMA-LAD and SVG-PDA), CVA (2001, residual aphasia and right spastic hemiplegia), and HLD who presented to [facility] ED on 10/17/2015 for evaluation of CP.   Subjective   Denies any chest pain or dyspnea overnight. NPO for stress test later this AM.   Inpatient Medications    . aspirin EC  81 mg Oral Daily  . atenolol  12.5 mg Oral BID  . atorvastatin  80 mg Oral q1800  . clopidogrel  75 mg Oral Daily  . enoxaparin (LOVENOX) injection  30 mg Subcutaneous Q24H  . ferrous sulfate  325 mg Oral Q breakfast  . fluconazole  200 mg Oral Daily  . oxybutynin  5 mg Oral Daily  . pantoprazole  40 mg Oral Daily  . pregabalin  150 mg Oral BID  . sertraline  75 mg Oral QPM  . trimethoprim  100 mg Oral QPM    Vital Signs    Filed Vitals:   10/17/15 1845 10/17/15 1900 10/17/15 1915 10/17/15 1955  BP: 145/75 144/78 148/80 142/70  Pulse: 50 49 51 52  Temp:    98.5 F (36.9 C)  TempSrc:    Oral  Resp: 13 15 17 18   Height:      Weight:    180 lb 1.9 oz (81.7 kg)  SpO2: 97% 98% 96% 97%    Intake/Output Summary (Last 24 hours) at 10/18/15 1034 Last data filed at 10/18/15 1000  Gross per 24 hour  Intake      0 ml  Output      0 ml  Net      0 ml   Filed Weights   10/17/15 1455 10/17/15 1955  Weight: 187 lb (84.823 kg) 180 lb 1.9 oz (81.7 kg)    Physical Exam    General: Well developed, well nourished, female appearing in no acute distress. Expressive aphasia. Head: Normocephalic, atraumatic.  Neck: Supple without bruits, JVD. Lungs:  Resp regular and unlabored, CTA without wheezing or rales. Heart: RRR, S1, S2, no S3, S4, or murmur; no rub. Abdomen: Soft, non-tender, non-distended with normoactive bowel sounds. No hepatomegaly. No rebound/guarding. No obvious abdominal masses. Extremities: No clubbing,  cyanosis, or edema. Distal pedal pulses are 2+ bilaterally. Neuro: Alert and oriented X 3. Expressive aphasia. Right-sided weakness. Psych: Normal affect.  Labs    CBC  Recent Labs  10/17/15 1541 10/18/15 0231  WBC 5.6 5.8  HGB 11.7* 11.4*  HCT 38.6 38.5  MCV 76.7* 77.3*  PLT 204 [id number]   Basic Metabolic Panel  Recent Labs  10/17/15 1541 10/18/15 0231  NA 138 139  K 4.0 3.7  CL 107 107  CO2 23 25  GLUCOSE 122* 109*  BUN 16 15  CREATININE 0.79 0.83  CALCIUM 8.9 8.5*   Liver Function Tests No results for input(s): AST, ALT, ALKPHOS, BILITOT, PROT, ALBUMIN in the last 72 hours. No results for input(s): LIPASE, AMYLASE in the last 72 hours. Cardiac Enzymes  Recent Labs  10/17/15 2100 10/18/15 10/18/15 0231  TROPONINI <0.03 <0.03 <0.03     Telemetry    Sinus bradycardia, HR in mid-40's - 50's.   ECG    No new tracings.   Cardiac Studies and Radiology    Dg Chest 2 View  10/17/2015  CLINICAL DATA:  Chest pain starting 2-3 hours ago. Shortness of breath. EXAM: CHEST  2 VIEW COMPARISON:  03/21/2014 FINDINGS: Prior CABG. Heart is borderline in size. No confluent airspace opacities or effusions. No acute bony abnormality. IMPRESSION: No active cardiopulmonary disease. Electronically Signed   By: Rolm Baptise M.D.   On: 10/17/2015 15:30   Ct Angio Chest Pe W/cm &/or Wo Cm  10/17/2015  CLINICAL DATA:  Shortness of breath and chest pain for several hours EXAM: CT ANGIOGRAPHY CHEST WITH CONTRAST TECHNIQUE: Multidetector CT imaging of the chest was performed using the standard protocol during bolus administration of intravenous contrast. Multiplanar CT image reconstructions and MIPs were obtained to evaluate the vascular anatomy. CONTRAST:  80 mL Isovue 370. COMPARISON:  Plain film from earlier in the same day. FINDINGS: Cardiovascular: The thoracic aorta and its branches are within normal limits. Changes of prior coronary bypass grafting are noted. Coronary calcifications  are seen. Aortic calcifications are noted as well. The pulmonary artery shows a normal branching pattern without evidence of intraluminal filling defect to suggest pulmonary emboli. Cardiac structures are within normal limits. Mediastinum/Nodes: No significant hilar or mediastinal adenopathy is noted. Postsurgical changes are seen. Lungs/Pleura: The lungs are well aerated bilaterally. Minimal bibasilar scarring is seen. No focal infiltrate or sizable effusion is noted. No pneumothorax is seen. Upper Abdomen: Within normal limits. Musculoskeletal: Degenerative changes of the thoracic spine are noted. No acute bony abnormality is seen. Review of the MIP images confirms the above findings. IMPRESSION: No evidence of pulmonary emboli. Aortic atherosclerotic change. No acute abnormality is noted. Electronically Signed   By: Inez Catalina M.D.   On: 10/17/2015 17:37    Assessment & Plan    1. Chest pain - presented with chest pain with exertion. Initial troponin negative and EKG without acute ischemic changes. - cyclic troponin values have been negative. Denies any repeat symptoms overnight. - for Lexiscan Myoview later this AM. Has been NPO since midnight. Morning Atenolol will be held for stress test. If negative and no evidence of ischemia, likely stable for discharge from a cardiology perspective.   2. CAD - s/p CABG in 2015 w/ LIMA-LAD and SVG-PDA - continue ASA, statin, Plavix, and BB. Has been bradycardiac in the mid-40's - 50's overnight. Already on lowest-dose of Atenolol. Consider discontinuing if bradycardia sustains.  3. CVA - in 2001, residual expressive aphasia and right-sided weakness noted.   Arna Medici , PA-C 10:34 AM 10/18/2015 Pager: [phone]  Patient seen, examined. Available data reviewed. Agree with findings, assessment, and plan as outlined by Bernerd Pho, PA-C. Myoview reviewed, normal LVEF, possible inferior defect noted. Reviewed with 2 of my  noninvasive cardiology colleagues who feel there is no significant ischemia on this stress test. Recommend medical management for CAD at this time. Reviewed plan with patient and family. Will stop beta-blocker because of bradycardia. Otherwise continue current Rx. Will arrange outpatient cardiology FU.  Sherren Mocha, M.D. 10/18/2015 4:36 PM

## 2015-10-18 NOTE — Progress Notes (Signed)
PROGRESS NOTE  Kristin [name] [id number] DOB: August 17, 1940 DOA: 10/17/2015 PCP: Donnajean Lopes, MD  HPI/Recap of past 24 hours:  Pleasant, baseline dysarthria  Assessment/Plan: Active Problems:   Chest pain   Chest pain - unclear etiology at this time, pt currently denies chest pain - so far, troponin negative, no acute findings on EKG - besides mild bradycardia, VS are stable -stress test 6/28 with reversible ischemia, on statin, asa and plavix, will follow up on cardiology recommendation   Bradycardia - on atenolol 12.5 mg PO BID at home - may need to hold temporarily until HR stabilizes  -cardiology to decide on betablocker use  Hx of stroke - with residual dysarthria and right hemiparesis - no acute neurological findings - continue aspirin and plavix   Candidal esophagitis - on recent EGD - continue home regimen with Fluconazole   HLD - continue statin   DVT prophylaxis: Lovenox SQ Code Status: Full Family Communication: Patient Disposition Plan: Home once cleared by cardiology team  Consults called: Cardiology    Procedures:  Stress test 6/28  Antibiotics:  none   Objective: BP 125/91 mmHg  Pulse 56  Temp(Src) 97.5 F (36.4 C) (Oral)  Resp 18  Ht 5\' 7"  (1.702 m)  Wt 81.7 kg (180 lb 1.9 oz)  BMI 28.20 kg/m2  SpO2 97%  Intake/Output Summary (Last 24 hours) at 10/18/15 1605 Last data filed at 10/18/15 1500  Gross per 24 hour  Intake    240 ml  Output      0 ml  Net    240 ml   Filed Weights   10/17/15 1455 10/17/15 1955  Weight: 84.823 kg (187 lb) 81.7 kg (180 lb 1.9 oz)    Exam:   General:  NAD  Cardiovascular: RRR  Respiratory: CTABL  Abdomen: Soft/ND/NT, positive BS  Musculoskeletal: No Edema  Neuro: base line deficit from prior cva  Data Reviewed: Basic Metabolic Panel:  Recent Labs Lab 10/17/15 1541 10/18/15 0231  NA 138 139  K 4.0 3.7  CL 107 107  CO2 23 25  GLUCOSE 122* 109*  BUN 16 15  CREATININE  0.79 0.83  CALCIUM 8.9 8.5*   Liver Function Tests: No results for input(s): AST, ALT, ALKPHOS, BILITOT, PROT, ALBUMIN in the last 168 hours. No results for input(s): LIPASE, AMYLASE in the last 168 hours. No results for input(s): AMMONIA in the last 168 hours. CBC:  Recent Labs Lab 10/17/15 1541 10/18/15 0231  WBC 5.6 5.8  HGB 11.7* 11.4*  HCT 38.6 38.5  MCV 76.7* 77.3*  PLT 204 182   Cardiac Enzymes:    Recent Labs Lab 10/17/15 2100 10/18/15 10/18/15 0231  TROPONINI <0.03 <0.03 <0.03   BNP (last 3 results) No results for input(s): BNP in the last 8760 hours.  ProBNP (last 3 results) No results for input(s): PROBNP in the last 8760 hours.  CBG: No results for input(s): GLUCAP in the last 168 hours.  No results found for this or any previous visit (from the past 240 hour(s)).   Studies: Ct Angio Chest Pe W/cm &/or Wo Cm  10/17/2015  CLINICAL DATA:  Shortness of breath and chest pain for several hours EXAM: CT ANGIOGRAPHY CHEST WITH CONTRAST TECHNIQUE: Multidetector CT imaging of the chest was performed using the standard protocol during bolus administration of intravenous contrast. Multiplanar CT image reconstructions and MIPs were obtained to evaluate the vascular anatomy. CONTRAST:  80 mL Isovue 370. COMPARISON:  Plain film from earlier in the same day. FINDINGS:  Cardiovascular: The thoracic aorta and its branches are within normal limits. Changes of prior coronary bypass grafting are noted. Coronary calcifications are seen. Aortic calcifications are noted as well. The pulmonary artery shows a normal branching pattern without evidence of intraluminal filling defect to suggest pulmonary emboli. Cardiac structures are within normal limits. Mediastinum/Nodes: No significant hilar or mediastinal adenopathy is noted. Postsurgical changes are seen. Lungs/Pleura: The lungs are well aerated bilaterally. Minimal bibasilar scarring is seen. No focal infiltrate or sizable effusion is  noted. No pneumothorax is seen. Upper Abdomen: Within normal limits. Musculoskeletal: Degenerative changes of the thoracic spine are noted. No acute bony abnormality is seen. Review of the MIP images confirms the above findings. IMPRESSION: No evidence of pulmonary emboli. Aortic atherosclerotic change. No acute abnormality is noted. Electronically Signed   By: Inez Catalina M.D.   On: 10/17/2015 17:37   Nm Myocar Multi W/spect W/wall Motion / Ef  10/18/2015  CLINICAL DATA:  Chest pain.  prior CABG. EXAM: MYOCARDIAL IMAGING WITH SPECT (REST AND PHARMACOLOGIC-STRESS) GATED LEFT VENTRICULAR WALL MOTION STUDY LEFT VENTRICULAR EJECTION FRACTION TECHNIQUE: Standard myocardial SPECT imaging was performed after resting intravenous injection of 10 mCi Tc-69m tetrofosmin. Subsequently, intravenous infusion of Lexiscan was performed under the supervision of the Cardiology staff. At peak effect of the drug, 30 mCi Tc-70m tetrofosmin was injected intravenously and standard myocardial SPECT imaging was performed. Quantitative gated imaging was also performed to evaluate left ventricular wall motion, and estimate left ventricular ejection fraction. COMPARISON:  12/10/2013 FINDINGS: Perfusion: There is decreased activity inferiorly, worsening on stress images concerning for inducible ischemia. Wall Motion: Decreased wall motion in the septum. Otherwise normal wall motion. Left Ventricular Ejection Fraction: 68 % End diastolic volume 58 ml End systolic volume 19 ml IMPRESSION: 1. Concern for reversible defect and inducible ischemia within the inferior wall. 2. Decreased grip all motion in the septum, likely related to prior CABG. Otherwise normal wall motion. 3. Left ventricular ejection fraction 68% 4. Non invasive risk stratification*: Intermediate *2012 Appropriate Use Criteria for Coronary Revascularization Focused Update: J Am Coll Cardiol. [id number]. http://content.airportbarriers.com.aspx?articleid=1201161  Electronically Signed   By: Rolm Baptise M.D.   On: 10/18/2015 14:52    Scheduled Meds: . aspirin EC  81 mg Oral Daily  . atenolol  12.5 mg Oral BID  . atorvastatin  80 mg Oral q1800  . clopidogrel  75 mg Oral Daily  . enoxaparin (LOVENOX) injection  30 mg Subcutaneous Q24H  . ferrous sulfate  325 mg Oral Q breakfast  . fluconazole  200 mg Oral Daily  . oxybutynin  5 mg Oral Daily  . pantoprazole  40 mg Oral Daily  . pregabalin  150 mg Oral BID  . regadenoson      . regadenoson  0.4 mg Intravenous Once  . sertraline  75 mg Oral QPM  . trimethoprim  100 mg Oral QPM    Continuous Infusions:    Time spent: 86mins  Braiden Rodman MD, PhD  [facility] Pager [phone]. If 7PM-7AM, please contact night-coverage at [email or web address], password [facility] 10/18/2015, 4:05 PM

## 2015-10-18 NOTE — Care Management Obs Status (Signed)
[place] NOTIFICATION   Patient Details  Name: Kristin [name] MRN: [id number] Date of Birth: Nov 12, 1940   Medicare Observation Status Notification Given:  Yes, pt unable to converse with this CM at this time. I did explain the Observation status and left copy at bedside. Pt able to speak a few words, one syllable, unsure if they were correct. Will be available to answer questions of family members should they visit and be concerned over the verbage of the MOON letter   Lynton Crescenzo, Antony Haste, RN 10/18/2015, 11:01 AM

## 2015-10-18 NOTE — Care Management Note (Signed)
Case Management Note  Patient Details  Name: Kristin [name] MRN: [id number] Date of Birth: [birth year]  Subjective/Objective:  75 y.o. F admitted 10/17/2015 with CP. PMH CAD, s/p  Stent 2011, CABG 2015, Residual aphasia and Rt spastic hemiplegia from MCA CVA 2001. [place]. Unable to clarify this as there is no family at bedside.                   Action/Plan:pt is currently NPO for Stress Test. If it is negative she will be discharged today.    Expected Discharge Date:                  Expected Discharge Plan:  Home/Self Care  In-House Referral:     Discharge planning Services  CM Consult  Post Acute Care Choice:    Choice offered to:  Patient  DME Arranged:    DME Agency:     HH Arranged:    [place] Agency:     Status of Service:  In process, will continue to follow  If discussed at Long Length of Stay Meetings, dates discussed:    Additional Comments:  Delrae Sawyers, RN 10/18/2015, 11:18 AM

## 2015-10-18 NOTE — Progress Notes (Signed)
Discharge teaching and instructions reviewed. Medication dosage change reviewed with pt, stepdaughter and husband. VSS. Discharging back home via stepdaughter and husband.

## 2015-11-13 ENCOUNTER — Encounter: Payer: Medicare Other | Attending: Physical Medicine & Rehabilitation | Admitting: Physical Medicine & Rehabilitation

## 2015-11-13 ENCOUNTER — Encounter: Payer: Self-pay | Admitting: Physical Medicine & Rehabilitation

## 2015-11-13 VITALS — BP 112/72 | HR 68

## 2015-11-13 DIAGNOSIS — M7581 Other shoulder lesions, right shoulder: Secondary | ICD-10-CM | POA: Diagnosis not present

## 2015-11-13 DIAGNOSIS — M75102 Unspecified rotator cuff tear or rupture of left shoulder, not specified as traumatic: Secondary | ICD-10-CM | POA: Insufficient documentation

## 2015-11-13 DIAGNOSIS — G8929 Other chronic pain: Secondary | ICD-10-CM | POA: Diagnosis present

## 2015-11-13 DIAGNOSIS — E785 Hyperlipidemia, unspecified: Secondary | ICD-10-CM | POA: Diagnosis not present

## 2015-11-13 DIAGNOSIS — M1990 Unspecified osteoarthritis, unspecified site: Secondary | ICD-10-CM | POA: Insufficient documentation

## 2015-11-13 DIAGNOSIS — G8191 Hemiplegia, unspecified affecting right dominant side: Secondary | ICD-10-CM | POA: Insufficient documentation

## 2015-11-13 DIAGNOSIS — F329 Major depressive disorder, single episode, unspecified: Secondary | ICD-10-CM | POA: Insufficient documentation

## 2015-11-13 DIAGNOSIS — I69320 Aphasia following cerebral infarction: Secondary | ICD-10-CM

## 2015-11-13 DIAGNOSIS — M7989 Other specified soft tissue disorders: Secondary | ICD-10-CM | POA: Diagnosis not present

## 2015-11-13 DIAGNOSIS — M79661 Pain in right lower leg: Secondary | ICD-10-CM | POA: Insufficient documentation

## 2015-11-13 DIAGNOSIS — M75101 Unspecified rotator cuff tear or rupture of right shoulder, not specified as traumatic: Secondary | ICD-10-CM | POA: Insufficient documentation

## 2015-11-13 DIAGNOSIS — I10 Essential (primary) hypertension: Secondary | ICD-10-CM | POA: Insufficient documentation

## 2015-11-13 DIAGNOSIS — I2510 Atherosclerotic heart disease of native coronary artery without angina pectoris: Secondary | ICD-10-CM | POA: Insufficient documentation

## 2015-11-13 DIAGNOSIS — M1711 Unilateral primary osteoarthritis, right knee: Secondary | ICD-10-CM

## 2015-11-13 DIAGNOSIS — H532 Diplopia: Secondary | ICD-10-CM | POA: Insufficient documentation

## 2015-11-13 NOTE — Progress Notes (Signed)
Subjective:    Patient ID: Kristin [name], female    DOB: [birth year], 75 y.o.   MRN: [id number]  HPI   Kristin [name] is here in follow up of her right hemiparesis. She is continuing to have right sided pain. The injection helped her right shoulder somewhat. Dopplers were negative RLE. She is involved in some PT associated with their living area. They are doing transfers and strengthening. Daughter asked about adjustments to there walker. Apparently she has a hand attachment with velcro to keep her hand on walker.     Pain Inventory Average Pain 10 Pain Right Now 7 My pain is constant, burning, dull and aching  In the last 24 hours, has pain interfered with the following? General activity 6 Relation with others 6 Enjoyment of life 6 What TIME of day is your pain at its worst? morning Sleep (in general) Fair  Pain is worse with: sitting and standing Pain improves with: nothing Relief from Meds: 1  Mobility use a walker ability to climb steps?  no do you drive?  no transfers alone Do you have any goals in this area?  no  Function disabled: date disabled . I need assistance with the following:  meal prep, household duties and shopping  Neuro/Psych bladder control problems weakness trouble walking  Prior Studies Any changes since last visit?  yes  Physicians involved in your care Any changes since last visit?  yes   Family History  Problem Relation Age of Onset  . Diabetes Mother   . Heart disease Mother   . Heart attack Father   . Breast cancer Sister    Social History   Social History  . Marital status: Married    Spouse name: N/A  . Number of children: 2  . Years of education: N/A   Occupational History  . retired Education officer, [employer]    Social History Main Topics  . Smoking status: Never Smoker  . Smokeless tobacco: Never Used  . Alcohol use 0.0 oz/week     Comment: glass of wine a day  . Drug use: No  . Sexual activity: No   Other Topics Concern  . None    Social History Narrative  . None   Past Surgical History:  Procedure Laterality Date  . ABDOMINAL HYSTERECTOMY    . CAROTID STENT    . cataracts    . COLONOSCOPY N/A 10/05/2015   Procedure: COLONOSCOPY;  Surgeon: Manus Gunning, MD;  Location: [facility];  Service: Gastroenterology;  Laterality: N/A;  . CORONARY ARTERY BYPASS GRAFT N/A 12/17/2013   Procedure: CORONARY ARTERY BYPASS GRAFTING (CABG);  Surgeon: Gaye Pollack, MD;  Location: [facility];  Service: Open Heart Surgery;  Laterality: N/A;  Times 2 using left internal mammary artery and endoscopically harvested right saphenous vein  . ESOPHAGOGASTRODUODENOSCOPY    . ESOPHAGOGASTRODUODENOSCOPY N/A 10/05/2015   Procedure: ESOPHAGOGASTRODUODENOSCOPY (EGD);  Surgeon: Manus Gunning, MD;  Location: [facility];  Service: Gastroenterology;  Laterality: N/A;  . INTRAOPERATIVE TRANSESOPHAGEAL ECHOCARDIOGRAM N/A 12/17/2013   Procedure: INTRAOPERATIVE TRANSESOPHAGEAL ECHOCARDIOGRAM;  Surgeon: Gaye Pollack, MD;  Location: [facility];  Service: Open Heart Surgery;  Laterality: N/A;  . lasix    . LEFT HEART CATHETERIZATION WITH CORONARY ANGIOGRAM N/A 12/13/2013   Procedure: LEFT HEART CATHETERIZATION WITH CORONARY ANGIOGRAM;  Surgeon: Jettie Booze, MD;  Location: [facility] CATH LAB;  Service: Cardiovascular;  Laterality: N/A;  . retina injection     Past Medical History:  Diagnosis Date  . Arthritis  hands  . CAD (coronary artery disease)    a. 2010: stents pRCA and dRCA. b. LHC 12/13/13: Minimal distal LM dz. LAD 95% ostial w/ L-->R collat. LCx mild dz. RCA 95% mid-dRCA. Patent stents proximal and distal RCA. EF not assessed, Nl by nuclear study.     . Central retinal vein occlusion, right eye   . Depression   . Diplopia   . Ear pain    right  . Eosinophilic esophagitis   . Essential hypertension   . GERD (gastroesophageal reflux disease)   . H/O: whooping cough   . Hyperlipidemia with target LDL less than 70   .  Incontinence    urine  . Stroke [facility]) 2001   reesultant Rt spastic hemplegia and aphasia    BP 112/72 (BP Location: Left Arm, Patient Position: Sitting, Cuff Size: Normal)   Pulse 68   SpO2 94%   Opioid Risk Score:   Fall Risk Score:  `1  Depression screen PHQ 2/9  Depression screen PHQ 2/9 11/11/2014  Decreased Interest 0  Down, Depressed, Hopeless 0  PHQ - 2 Score 0    Review of Systems  HENT: Negative.   Eyes: Negative.   Respiratory: Negative.   Cardiovascular: Negative.   Gastrointestinal: Negative.   Endocrine: Negative.   Genitourinary: Negative.   Musculoskeletal: Negative.   Skin: Negative.   Allergic/Immunologic: Negative.   Neurological: Positive for weakness.  Hematological: Negative.        Objective:   Physical Exam         Physical Exam  General: Alert , No apparent distress  HEENT: Head is normocephalic, atraumatic, PERRLA, EOMI, sclera anicteric, oral mucosa pink and moist, dentition intact, ext ear canals clear,  Neck: Supple without JVD or lymphadenopathy  Heart: Reg rate and rhythm. No murmurs rubs or gallops  Chest: CTA bilaterally without wheezes, rales, or rhonchi; no distress  Abdomen: Soft, non-tender, non-distended, bowel sounds positive.  Extremities: No clubbing, cyanosis, or edema. Pulses are 2+  Skin: intact  Neuro: Continued aphasia. Is better with automatic phrases. I think her receptive language skills have improved a bit since I last saw her.. Has dense HP of RUE but with 1+ Bicep, pecs, deltoid now. Has 2-3/5 strength in the proximal RLE with AFO in place for distal control. She has mild resting tone in the right hand and wrist. She does have diminished pain sense on the right side but IS able to sense pain. She ambulated with less circumduction, recurvatum improved also.  Musculoskeletal: she has medial and lateral joint line pain at the right knee with palpation and IR/ER. Right shoulder is tender w with impingement maneuver  but improved  . She has an almost 1 cm sublux as well of the right humerus from the glenoid fossa. Harness is appropriately fitting today. She has tenderness with ir/er of left shoulder but remains improved compared to before injection. Left biceps long head tendon appears the most tender. RLE with 1+  edema with positive homan's sign. Calf itself slightly tender also. No warmth or cords palpated Psych: Pt's affect is appropriate. Pt is cooperative   Assessment & Plan:   1. Hx of left MCA infarct with spastic right hemiparesis, expressive greater than receptive aphasia  2. . RLE pain and swelling. The more I see her, I feel this is most likely related to the OA in her right knee---I see no other obvious sources.  Her xray from 2014 shows mild OA.   4. Insomnia, anxiety  5. Constipation 6. Right shoulder pain related to chronic subluxation, mild adhesive capsulitis, chronic spasticity  7. OSA.  8. Left rotator cuff tendonitis/bursitis;biciptial tendonitis on exam today  9. Right rotator cuff tendonitis/right shoulder sublux 10. ?neuropathic pain right side---on lyrica    Plan:  1. Ordered right knee brace and replacement of velcro right afo. Recommended supplements for joints also including tart red cherry extract, turmeric, glucosamine with chondroitin, and ginger.   -consider joint injection if need be 2. Therapy to work on strength, technique   3. Continue right shoulder harness for support. Do not want to change or increase lyrica at this time. 4. Pacing and preservation for LUE. Provided her basic shoulder exercises.  5. Continue use of voltaren gel for the both the knee and the shoulder  6. I will see her back in 2 months. 30 minutes of face to face patient care time were spent during this visit. All questions were encouraged and answered.

## 2015-11-13 NOTE — Patient Instructions (Signed)
SUPPLEMENTS: GLUCOSAMINE WITH CHONDROITIN. TART RED CHERRY EXTRACT, TURMERIC, GINGER  ICE  ESPECIALLY AFTER  EXERCISE---TID  KNEE BRACE PER HANGER   PT CAN WORK ON QUAD AND HAMSTRING STRENGTHENING AND TECHNIQUE WITH STANDING AND WALKING.

## 2015-11-17 ENCOUNTER — Encounter: Payer: Self-pay | Admitting: Physician Assistant

## 2015-11-17 ENCOUNTER — Ambulatory Visit (INDEPENDENT_AMBULATORY_CARE_PROVIDER_SITE_OTHER): Payer: Medicare Other | Admitting: Physician Assistant

## 2015-11-17 VITALS — BP 98/62 | HR 73 | Ht 66.0 in | Wt 184.4 lb

## 2015-11-17 DIAGNOSIS — I6930 Unspecified sequelae of cerebral infarction: Secondary | ICD-10-CM | POA: Diagnosis not present

## 2015-11-17 DIAGNOSIS — E785 Hyperlipidemia, unspecified: Secondary | ICD-10-CM | POA: Diagnosis not present

## 2015-11-17 DIAGNOSIS — I25810 Atherosclerosis of coronary artery bypass graft(s) without angina pectoris: Secondary | ICD-10-CM | POA: Diagnosis not present

## 2015-11-17 DIAGNOSIS — I10 Essential (primary) hypertension: Secondary | ICD-10-CM

## 2015-11-17 NOTE — Progress Notes (Signed)
Cardiology Office Note    Date:  11/17/2015   ID:  Kristin [name], DOB 1941-03-16, MRN [id number]  PCP:  Donnajean Lopes, MD  Cardiologist:  Dr. Burt Knack (Previously Dr. Mare Ferrari)  Chief Complaint  Patient presents with  . Hospitalization Follow-up    seen for Dr. Burt Knack    History of Present Illness:  Kristin [name] is a 75 y.o. female with PMH of HTN, HLD, CVA with expressive aphasia and R sided weakness, CAD s/p PCI and CABG x2 by Dr. Cyndia Bent 12/17/2013 (LIMA to LAD, SVG to PDA). She was recently admitted in the hospital on 6/27 for chest pain. Her history was limited by expressive aphasia. Myoview obtained on 6/28 showed EF 68%, concern for reversible defect and inducible ischemia within the inferior wall, decreased wall motion in the septum likely related to the previous CABG, otherwise normal wall motion. This Myoview was reviewed by Dr. Burt Knack and 2 non-invasive cardiologists, who felt there was no obvious ischemia, eventually it was recommended for the patient to continue medical therapy. During the hospitalization, she did have some bradycardia with heart rate in the 40s to 50s. Her beta blocker was discontinued as she was already on the lowest possible dose.  The patient has been doing well since she left the hospital. She has no recurrence of chest pain. She denies any shortness of breath. She does walk around with a walker. Heart rate has improved today to the 70s. Her blood pressure is borderline with systolic blood pressure in the 90s however according to the stepdaughter, her blood pressure is usually low in the 90s. She is continue on amlodipine 5 mg daily. She is continued on aspirin and Plavix and along with Lipitor. She check her lipid panel annually at her PCPs office.    Past Medical History:  Diagnosis Date  . Arthritis    hands  . CAD (coronary artery disease)    a. 2010: stents pRCA and dRCA. b. LHC 12/13/13: Minimal distal LM dz. LAD 95% ostial w/ L-->R collat. LCx mild dz.  RCA 95% mid-dRCA. Patent stents proximal and distal RCA. EF not assessed, Nl by nuclear study.     . Central retinal vein occlusion, right eye   . Depression   . Diplopia   . Ear pain    right  . Eosinophilic esophagitis   . Essential hypertension   . GERD (gastroesophageal reflux disease)   . H/O: whooping cough   . Hyperlipidemia with target LDL less than 70   . Incontinence    urine  . Stroke [facility]) 2001   reesultant Rt spastic hemplegia and aphasia     Past Surgical History:  Procedure Laterality Date  . ABDOMINAL HYSTERECTOMY    . CAROTID STENT    . cataracts    . COLONOSCOPY N/A 10/05/2015   Procedure: COLONOSCOPY;  Surgeon: Manus Gunning, MD;  Location: [facility];  Service: Gastroenterology;  Laterality: N/A;  . CORONARY ARTERY BYPASS GRAFT N/A 12/17/2013   Procedure: CORONARY ARTERY BYPASS GRAFTING (CABG);  Surgeon: Gaye Pollack, MD;  Location: [facility];  Service: Open Heart Surgery;  Laterality: N/A;  Times 2 using left internal mammary artery and endoscopically harvested right saphenous vein  . ESOPHAGOGASTRODUODENOSCOPY    . ESOPHAGOGASTRODUODENOSCOPY N/A 10/05/2015   Procedure: ESOPHAGOGASTRODUODENOSCOPY (EGD);  Surgeon: Manus Gunning, MD;  Location: [facility];  Service: Gastroenterology;  Laterality: N/A;  . INTRAOPERATIVE TRANSESOPHAGEAL ECHOCARDIOGRAM N/A 12/17/2013   Procedure: INTRAOPERATIVE TRANSESOPHAGEAL ECHOCARDIOGRAM;  Surgeon: Gaye Pollack, MD;  Location: [facility];  Service: Open Heart Surgery;  Laterality: N/A;  . lasix    . LEFT HEART CATHETERIZATION WITH CORONARY ANGIOGRAM N/A 12/13/2013   Procedure: LEFT HEART CATHETERIZATION WITH CORONARY ANGIOGRAM;  Surgeon: Jettie Booze, MD;  Location: [facility] CATH LAB;  Service: Cardiovascular;  Laterality: N/A;  . retina injection      Current Medications: Outpatient Medications Prior to Visit  Medication Sig Dispense Refill  . amLODipine (NORVASC) 5 MG tablet Take 5 mg by mouth daily.    Marland Kitchen  aspirin 81 MG tablet Take 81 mg by mouth daily.    Marland Kitchen atorvastatin (LIPITOR) 40 MG tablet Take 80 mg by mouth daily at 6 PM.     . clopidogrel (PLAVIX) 75 MG tablet Take 75 mg by mouth daily.    . diclofenac sodium (VOLTAREN) 1 % GEL Apply 4 g topically 4 (four) times daily as needed (pain).     . nitroGLYCERIN (NITROSTAT) 0.4 MG SL tablet Place 1 tablet (0.4 mg total) under the tongue every 5 (five) minutes as needed for chest pain. 30 tablet 0  . omeprazole (PRILOSEC) 20 MG capsule Take 20 mg by mouth daily.    Marland Kitchen oxybutynin (DITROPAN) 5 MG tablet Take 5 mg by mouth daily.    . pregabalin (LYRICA) 150 MG capsule Take 150 mg by mouth 2 (two) times daily.     . sertraline (ZOLOFT) 50 MG tablet Take 75 mg by mouth every evening.     . trimethoprim (TRIMPEX) 100 MG tablet Take 100 mg by mouth every evening.     . ferrous sulfate 325 (65 FE) MG tablet Take 1 tablet (325 mg total) by mouth daily with breakfast. 30 tablet 3  . fluconazole (DIFLUCAN) 200 MG tablet 2 tabs po on day 1, then 1 tab daily for 13 more days (Patient taking differently: Take 200 mg by mouth See admin instructions. 2 tabs po on day 1, then 1 tab daily for 13 more days) 15 tablet 0   No facility-administered medications prior to visit.      Allergies:   Review of patient's allergies indicates no known allergies.   Social History   Social History  . Marital status: Married    Spouse name: N/A  . Number of children: 2  . Years of education: N/A   Occupational History  . retired Education officer, [employer]    Social History Main Topics  . Smoking status: Never Smoker  . Smokeless tobacco: Never Used  . Alcohol use 0.0 oz/week     Comment: glass of wine a day  . Drug use: No  . Sexual activity: No   Other Topics Concern  . None   Social History Narrative  . None     Family History:  The patient's family history includes Breast cancer in her sister; Diabetes in her mother; Heart attack in her father; Heart disease in her  mother.   ROS:   Please see the history of present illness.    ROS All other systems reviewed and are negative.   PHYSICAL EXAM:   VS:  BP 98/62   Pulse 73   Ht 5\' 6"  (1.676 m)   Wt 184 lb 6.4 oz (83.6 kg)   SpO2 93%   BMI 29.76 kg/m    GEN: Well nourished, well developed, in no acute distress. In wheel chair HEENT: normal  Neck: no JVD, carotid bruits, or masses Cardiac: RRR; no murmurs, rubs, or gallops,no edema  Respiratory:  clear to  auscultation bilaterally, normal work of breathing GI: soft, nontender, nondistended, + BS MS: no deformity or atrophy  Skin: warm and dry, no rash Neuro:  Alert and Oriented x 3, expressive aphasia, difficulty forming words Psych: euthymic mood, full affect  Wt Readings from Last 3 Encounters:  11/17/15 184 lb 6.4 oz (83.6 kg)  10/17/15 180 lb 1.9 oz (81.7 kg)  10/05/15 184 lb (83.5 kg)      Studies/Labs Reviewed:   EKG:  EKG is not ordered today.    Recent Labs: 10/18/2015: BUN 15; Creatinine, Ser 0.83; Hemoglobin 11.4; Platelets 182; Potassium 3.7; Sodium 139   Lipid Panel No results found for: CHOL, TRIG, HDL, CHOLHDL, VLDL, LDLCALC, LDLDIRECT  Additional studies/ records that were reviewed today include:   Echo 12/10/2013 LV EF: 65%  ------------------------------------------------------------------- Indications:   Chest pain 786.51.  ------------------------------------------------------------------- History:  PMH:  Coronary artery disease. Stroke. Risk factors: Dyslipidemia.  ------------------------------------------------------------------- Study Conclusions  - Left ventricle: The cavity size was normal. Wall thickness was normal. The estimated ejection fraction was 65%. Wall motion was normal; there were no regional wall motion abnormalities. - Aortic valve: Sclerosis without stenosis. There was no significant regurgitation. - Left atrium: The atrium was mildly dilated. - Right ventricle: The cavity  size was normal. Systolic function was normal.   Myoview 10/18/2015 IMPRESSION: 1. Concern for reversible defect and inducible ischemia within the inferior wall.  2. Decreased grip all motion in the septum, likely related to prior CABG. Otherwise normal wall motion.  3. Left ventricular ejection fraction 68%  4. Non invasive risk stratification*: Intermediate Note: this study has been overread by 2 noninvasive cardiologist who felt there was no ischemia and continue medical therapy    ASSESSMENT:    1. Coronary artery disease involving coronary bypass graft of native heart without angina pectoris   2. Hyperlipidemia   3. Essential hypertension   4. H/O: stroke with residual effects      PLAN:  In order of problems listed above:  1. CAD s/p CABG x2 by Dr. Cyndia Bent 12/17/2013 (LIMA to LAD, SVG to PDA): no further chest pain since discharge. Although originally Myoview was interpreted as mild ischemia, stress test has been over read by 2 noninvasive cardiologist who felt it was actually normal. We'll continue observation and medical therapy.  2. HTN: SBP 90s, on amlodipine. Per stepdaughter, this is actually normal BP for her.   3. HLD: no previous lipid panel noted, it seems she checks it at her PCP's office.   4. H/o CVA with expressive aphasia: stable     Medication Adjustments/Labs and Tests Ordered: Current medicines are reviewed at length with the patient today.  Concerns regarding medicines are outlined above.  Medication changes, Labs and Tests ordered today are listed in the Patient Instructions below. Patient Instructions  Medication Instructions:  None  Labwork: None  Testing/Procedures: None  Follow-Up: Your physician recommends that you schedule a follow-up appointment in: 3 months with Dr. Burt Knack.   Any Other Special Instructions Will Be Listed Below (If Applicable).     If you need a refill on your cardiac medications before your next  appointment, please call your pharmacy.      Hilbert Corrigan, [place]  11/17/2015 10:09 PM    [facility] [place], [place], [place]  [address] Phone: [phone]; Fax: [phone]

## 2015-11-17 NOTE — Patient Instructions (Signed)
Medication Instructions:  None  Labwork: None  Testing/Procedures: None  Follow-Up: Your physician recommends that you schedule a follow-up appointment in: 3 months with Dr. Burt Knack.   Any Other Special Instructions Will Be Listed Below (If Applicable).     If you need a refill on your cardiac medications before your next appointment, please call your pharmacy.

## 2015-12-25 ENCOUNTER — Encounter (HOSPITAL_COMMUNITY): Payer: Self-pay | Admitting: Emergency Medicine

## 2015-12-25 ENCOUNTER — Emergency Department (HOSPITAL_COMMUNITY): Payer: Medicare Other

## 2015-12-25 ENCOUNTER — Emergency Department (HOSPITAL_COMMUNITY)
Admission: EM | Admit: 2015-12-25 | Discharge: 2015-12-25 | Disposition: A | Discharge status: Home / Self Care | Payer: Medicare Other | Attending: Emergency Medicine | Admitting: Emergency Medicine

## 2015-12-25 DIAGNOSIS — Z7982 Long term (current) use of aspirin: Secondary | ICD-10-CM | POA: Insufficient documentation

## 2015-12-25 DIAGNOSIS — I10 Essential (primary) hypertension: Secondary | ICD-10-CM | POA: Insufficient documentation

## 2015-12-25 DIAGNOSIS — Z8673 Personal history of transient ischemic attack (TIA), and cerebral infarction without residual deficits: Secondary | ICD-10-CM | POA: Insufficient documentation

## 2015-12-25 DIAGNOSIS — Z951 Presence of aortocoronary bypass graft: Secondary | ICD-10-CM | POA: Diagnosis not present

## 2015-12-25 DIAGNOSIS — I2510 Atherosclerotic heart disease of native coronary artery without angina pectoris: Secondary | ICD-10-CM | POA: Diagnosis not present

## 2015-12-25 DIAGNOSIS — Z79899 Other long term (current) drug therapy: Secondary | ICD-10-CM | POA: Diagnosis not present

## 2015-12-25 DIAGNOSIS — R079 Chest pain, unspecified: Secondary | ICD-10-CM | POA: Diagnosis present

## 2015-12-25 DIAGNOSIS — N390 Urinary tract infection, site not specified: Secondary | ICD-10-CM | POA: Insufficient documentation

## 2015-12-25 DIAGNOSIS — Z955 Presence of coronary angioplasty implant and graft: Secondary | ICD-10-CM | POA: Diagnosis not present

## 2015-12-25 LAB — URINALYSIS, ROUTINE W REFLEX MICROSCOPIC
Bilirubin Urine: NEGATIVE
Glucose, UA: NEGATIVE mg/dL
Ketones, ur: NEGATIVE mg/dL
Nitrite: POSITIVE — AB
Protein, ur: NEGATIVE mg/dL
Specific Gravity, Urine: 1.017 (ref 1.005–1.030)
pH: 5.5 (ref 5.0–8.0)

## 2015-12-25 LAB — COMPREHENSIVE METABOLIC PANEL
ALT: 14 U/L (ref 14–54)
AST: 19 U/L (ref 15–41)
Albumin: 3.8 g/dL (ref 3.5–5.0)
Alkaline Phosphatase: 80 U/L (ref 38–126)
Anion gap: 6 (ref 5–15)
BUN: 17 mg/dL (ref 6–20)
CO2: 27 mmol/L (ref 22–32)
Calcium: 9.1 mg/dL (ref 8.9–10.3)
Chloride: 106 mmol/L (ref 101–111)
Creatinine, Ser: 0.88 mg/dL (ref 0.44–1.00)
GFR calc Af Amer: >60 mL/min (ref >60)
GFR calc non Af Amer: >60 mL/min (ref >60)
Glucose, Bld: 105 mg/dL — ABNORMAL HIGH (ref 65–99)
Potassium: 4 mmol/L (ref 3.5–5.1)
Sodium: 139 mmol/L (ref 135–145)
Total Bilirubin: 0.6 mg/dL (ref 0.3–1.2)
Total Protein: 6.4 g/dL — ABNORMAL LOW (ref 6.5–8.1)

## 2015-12-25 LAB — CBC
HCT: 45.6 % (ref 36.0–46.0)
Hemoglobin: 14 g/dL (ref 12.0–15.0)
MCH: 26.3 pg (ref 26.0–34.0)
MCHC: 30.7 g/dL (ref 30.0–36.0)
MCV: 85.7 fL (ref 78.0–100.0)
Platelets: 198 10*3/uL (ref 150–400)
RBC: 5.32 MIL/uL — ABNORMAL HIGH (ref 3.87–5.11)
RDW: 16.7 % — ABNORMAL HIGH (ref 11.5–15.5)
WBC: 5.8 10*3/uL (ref 4.0–10.5)

## 2015-12-25 LAB — URINE MICROSCOPIC-ADD ON

## 2015-12-25 LAB — TROPONIN I
Troponin I: <0.03 ng/mL (ref <0.03)
Troponin I: <0.03 ng/mL (ref <0.03)

## 2015-12-25 LAB — PROTIME-INR
INR: 0.96
Prothrombin Time: 12.8 seconds (ref 11.4–15.2)

## 2015-12-25 MED ORDER — CEPHALEXIN 250 MG PO CAPS
500.0000 mg | ORAL_CAPSULE | Freq: Once | ORAL | Status: AC
  Administered 2015-12-25: 500 mg via ORAL
  Filled 2015-12-25: qty 2

## 2015-12-25 MED ORDER — CEPHALEXIN 500 MG PO CAPS: 500.0000 mg | ORAL_CAPSULE | Freq: Four times a day (QID) | ORAL | 0 refills | Status: DC

## 2015-12-25 NOTE — ED Provider Notes (Signed)
[facility] DEPT Provider Note   CSN: [id number] Arrival date & time: 12/25/15  1658     History   Chief Complaint Chief Complaint  Patient presents with  . Chest Pain    HPI Kristin [name] is a 75 y.o. female.  Pt presents to the ED today with CP.  The pt has a hx of a prior CVA with right sided weakness and speech deficits, so history is difficult to obtain.  The pt told her husband that she had CP after a walk.  Pt took ASA and 1 nitro which relieved her pain.  Pt was admitted to [facility] on 6/27-28 of this year for CP.  The pt had a negative stress test and cardiology opted to treat with medical management.  The pt saw her cardiologist on 7/28 and was still doing well.        Past Medical History:  Diagnosis Date  . Arthritis    hands  . CAD (coronary artery disease)    a. 2010: stents pRCA and dRCA. b. LHC 12/13/13: Minimal distal LM dz. LAD 95% ostial w/ L-->R collat. LCx mild dz. RCA 95% mid-dRCA. Patent stents proximal and distal RCA. EF not assessed, Nl by nuclear study.     . Central retinal vein occlusion, right eye   . Depression   . Diplopia   . Ear pain    right  . Eosinophilic esophagitis   . Essential hypertension   . GERD (gastroesophageal reflux disease)   . H/O: whooping cough   . Hyperlipidemia with target LDL less than 70   . Incontinence    urine  . Stroke [facility]) 2001   reesultant Rt spastic hemplegia and aphasia     Patient Active Problem List   Diagnosis Date Noted  . Chest pain syndrome   . Chest pain 10/17/2015  . IDA (iron deficiency anemia) 10/04/2015  . Right leg swelling 08/14/2015  . Right rotator cuff tendinitis 08/14/2015  . Biceps tendonitis on left 03/13/2015  . Injury of left rotator cuff 08/17/2014  . Shoulder subluxation, right 07/06/2014  . S/P CABG x 2 12/17/2013  . Essential hypertension   . Unstable angina ([place]) 12/09/2013  . Atherosclerotic heart disease of native coronary artery with unstable angina pectoris: H/o PCI to  RCA; Ostial LAD &dRCA severe stenosis. 12/09/2013  . Dyslipidemia, goal LDL below 70 12/09/2013  . Abnormal EKG - Anterior TWI  12/09/2013  . Open wound of scalp, without mention of complication [id number]  . CVA 2001 06/01/2012  . Osteoarthritis of right knee 06/01/2012  . Lumbago 06/01/2012  . Spastic hemiplegia affecting dominant side ([place]) 06/01/2012  . Aphasia as late effect of stroke 06/01/2012    Past Surgical History:  Procedure Laterality Date  . ABDOMINAL HYSTERECTOMY    . CAROTID STENT    . cataracts    . COLONOSCOPY N/A 10/05/2015   Procedure: COLONOSCOPY;  Surgeon: Manus Gunning, MD;  Location: [facility];  Service: Gastroenterology;  Laterality: N/A;  . CORONARY ARTERY BYPASS GRAFT N/A 12/17/2013   Procedure: CORONARY ARTERY BYPASS GRAFTING (CABG);  Surgeon: Gaye Pollack, MD;  Location: [facility];  Service: Open Heart Surgery;  Laterality: N/A;  Times 2 using left internal mammary artery and endoscopically harvested right saphenous vein  . ESOPHAGOGASTRODUODENOSCOPY    . ESOPHAGOGASTRODUODENOSCOPY N/A 10/05/2015   Procedure: ESOPHAGOGASTRODUODENOSCOPY (EGD);  Surgeon: Manus Gunning, MD;  Location: [facility];  Service: Gastroenterology;  Laterality: N/A;  . INTRAOPERATIVE TRANSESOPHAGEAL ECHOCARDIOGRAM N/A 12/17/2013  Procedure: INTRAOPERATIVE TRANSESOPHAGEAL ECHOCARDIOGRAM;  Surgeon: Gaye Pollack, MD;  Location: [facility] OR;  Service: Open Heart Surgery;  Laterality: N/A;  . lasix    . LEFT HEART CATHETERIZATION WITH CORONARY ANGIOGRAM N/A 12/13/2013   Procedure: LEFT HEART CATHETERIZATION WITH CORONARY ANGIOGRAM;  Surgeon: Jettie Booze, MD;  Location: [facility] CATH LAB;  Service: Cardiovascular;  Laterality: N/A;  . retina injection      OB History    No data available       Home Medications    Prior to Admission medications   Medication Sig Start Date End Date Taking? Authorizing Provider  amLODipine (NORVASC) 5 MG tablet Take 5 mg by mouth  daily.    Historical Provider, MD  aspirin 81 MG tablet Take 81 mg by mouth daily.    Historical Provider, MD  atorvastatin (LIPITOR) 40 MG tablet Take 80 mg by mouth daily at 6 PM.     Historical Provider, MD  cephALEXin (KEFLEX) 500 MG capsule Take 1 capsule (500 mg total) by mouth 4 (four) times daily. 12/25/15   Isla Pence, MD  clopidogrel (PLAVIX) 75 MG tablet Take 75 mg by mouth daily.    Historical Provider, MD  diclofenac sodium (VOLTAREN) 1 % GEL Apply 4 g topically 4 (four) times daily as needed (pain).     Historical Provider, MD  fluconazole (DIFLUCAN) 200 MG tablet 2 tabs by mouth on day 1, then 1 tab by mouth daily for 13 more days    Historical Provider, MD  nitroGLYCERIN (NITROSTAT) 0.4 MG SL tablet Place 1 tablet (0.4 mg total) under the tongue every 5 (five) minutes as needed for chest pain. 10/18/15   Florencia Reasons, MD  omeprazole (PRILOSEC) 20 MG capsule Take 20 mg by mouth daily.    Historical Provider, MD  oxybutynin (DITROPAN) 5 MG tablet Take 5 mg by mouth daily.    Historical Provider, MD  pregabalin (LYRICA) 150 MG capsule Take 150 mg by mouth 2 (two) times daily.     Historical Provider, MD  sertraline (ZOLOFT) 50 MG tablet Take 75 mg by mouth every evening.     Historical Provider, MD  trimethoprim (TRIMPEX) 100 MG tablet Take 100 mg by mouth every evening.     Historical Provider, MD    Family History Family History  Problem Relation Age of Onset  . Diabetes Mother   . Heart disease Mother   . Heart attack Father   . Breast cancer Sister     Social History Social History  Substance Use Topics  . Smoking status: Never Smoker  . Smokeless tobacco: Never Used  . Alcohol use 0.0 oz/week     Comment: glass of wine a day     Allergies   Review of patient's allergies indicates no known allergies.   Review of Systems Review of Systems  Cardiovascular: Positive for chest pain.  All other systems reviewed and are negative.    Physical Exam Updated Vital  Signs BP 146/82   Pulse (!) 58   Temp 97.7 F (36.5 C) (Oral)   Resp 22   Ht 5\' 7"  (1.702 m)   Wt 177 lb (80.3 kg)   SpO2 94%   BMI 27.72 kg/m   Physical Exam  Constitutional: She appears well-developed and well-nourished.  HENT:  Head: Normocephalic and atraumatic.  Right Ear: External ear normal.  Left Ear: External ear normal.  Nose: Nose normal.  Mouth/Throat: Oropharynx is clear and moist.  Eyes: Conjunctivae and EOM are normal.  Pupils are equal, round, and reactive to light.  Neck: Normal range of motion. Neck supple.  Cardiovascular: Normal rate, regular rhythm, normal heart sounds and intact distal pulses.   Pulmonary/Chest: Effort normal and breath sounds normal.  Abdominal: Soft. Bowel sounds are normal.  Musculoskeletal: Normal range of motion.  Neurological: She is alert.  Pt has speech deficits (chronic) and right sided weakness (chronic).  She is awake and is following commands.  Skin: Skin is warm and dry.  Psychiatric: She has a normal mood and affect. Her behavior is normal. Judgment and thought content normal.  Nursing note and vitals reviewed.    ED Treatments / Results  Labs (all labs ordered are listed, but only abnormal results are displayed) Labs Reviewed  CBC - Abnormal; Notable for the following:       Result Value   RBC 5.32 (*)    RDW 16.7 (*)    All other components within normal limits  COMPREHENSIVE METABOLIC PANEL - Abnormal; Notable for the following:    Glucose, Bld 105 (*)    Total Protein 6.4 (*)    All other components within normal limits  URINALYSIS, ROUTINE W REFLEX MICROSCOPIC (NOT AT [facility]) - Abnormal; Notable for the following:    APPearance HAZY (*)    Hgb urine dipstick SMALL (*)    Nitrite POSITIVE (*)    Leukocytes, UA LARGE (*)    All other components within normal limits  URINE MICROSCOPIC-ADD ON - Abnormal; Notable for the following:    Squamous Epithelial / LPF 6-30 (*)    Bacteria, UA FEW (*)    All other  components within normal limits  URINE CULTURE  PROTIME-INR  TROPONIN I  TROPONIN I    EKG  EKG Interpretation  Date/Time:  Monday December 25 2015 17:01:38 EDT Ventricular Rate:  62 PR Interval:    QRS Duration: 85 QT Interval:  455 QTC Calculation: 463 R Axis:   -26 Text Interpretation:  Sinus rhythm Borderline left axis deviation Low voltage, precordial leads Nonspecific T abnrm, anterolateral leads Confirmed by [facility] MD, Stryder Poitra ([id number]) on 12/25/2015 5:28:42 PM       Radiology Dg Chest 2 View  Result Date: 12/25/2015 CLINICAL DATA:  Chest pain today.  No known injury. EXAM: CHEST  2 VIEW COMPARISON:  PA and lateral chest and CT chest 10/17/2015. FINDINGS: The patient is status post CABG. Heart size is upper normal. Mild atelectasis in the left lung base is noted. Lungs are otherwise clear. No pneumothorax or pleural effusion. No focal bony abnormality. IMPRESSION: Cardiomegaly without acute disease. Electronically Signed   By: Inge Rise M.D.   On: 12/25/2015 18:06    Procedures Procedures (including critical care time)  Medications Ordered in ED Medications  cephALEXin (KEFLEX) capsule 500 mg (not administered)     Initial Impression / Assessment and Plan / ED Course  I have reviewed the triage vital signs and the nursing notes.  Pertinent labs & imaging results that were available during my care of the patient were reviewed by me and considered in my medical decision making (see chart for details).  Clinical Course   Pt has had no further chest pain while here.  She had a stress test in June.  He has had a nl ekg and 2 sets of negative troponins.  Pt ok for d/c.  Pt also has an UTI.  She has an indwelling catheter, and has had many UTIs.  She will be started on keflex and urine  will be sent for a culture.   Final Clinical Impressions(s) / ED Diagnoses   Final diagnoses:  Chest pain, unspecified chest pain type  UTI (lower urinary tract infection)     New Prescriptions New Prescriptions   CEPHALEXIN (KEFLEX) 500 MG CAPSULE    Take 1 capsule (500 mg total) by mouth 4 (four) times daily.     Isla Pence, MD 12/25/15 [phone]

## 2015-12-25 NOTE — ED Triage Notes (Signed)
Pt here with CP starting today. Pt has previous stroke with right sided deficits and speech deficits. Pt took 325 ASA and 1 nitro PTA with some relief.

## 2015-12-27 LAB — URINE CULTURE

## 2015-12-28 ENCOUNTER — Ambulatory Visit: Payer: Medicare Other | Admitting: Podiatry

## 2016-01-01 ENCOUNTER — Encounter: Payer: Self-pay | Admitting: Podiatry

## 2016-01-01 ENCOUNTER — Ambulatory Visit (INDEPENDENT_AMBULATORY_CARE_PROVIDER_SITE_OTHER): Payer: Medicare Other | Admitting: Podiatry

## 2016-01-01 DIAGNOSIS — B351 Tinea unguium: Secondary | ICD-10-CM | POA: Diagnosis not present

## 2016-01-01 DIAGNOSIS — M79675 Pain in left toe(s): Secondary | ICD-10-CM

## 2016-01-01 DIAGNOSIS — M79674 Pain in right toe(s): Secondary | ICD-10-CM

## 2016-01-01 DIAGNOSIS — M79604 Pain in right leg: Secondary | ICD-10-CM

## 2016-01-01 DIAGNOSIS — L84 Corns and callosities: Secondary | ICD-10-CM

## 2016-01-01 DIAGNOSIS — M79605 Pain in left leg: Secondary | ICD-10-CM

## 2016-01-02 NOTE — Progress Notes (Signed)
Subjective:     Patient ID: Kristin [name], female   DOB: 1940-10-31, 75 y.o.   MRN: [id number]  HPI patient presents stating that she has thickened nails 1-5 both feet and history of vascular disease and states she cannot cut them   Review of Systems     Objective:   Physical Exam Neurovascular status unchanged with thick yellow brittle nailbeds 1-5 both feet that are incurvated and painful    Assessment:     Mycotic nail infection with pain 1-5 both feet    Plan:     Debris painful nailbeds 1-5 both feet no iatrogenic bleeding noted

## 2016-01-15 ENCOUNTER — Encounter: Payer: Self-pay | Admitting: Physical Medicine & Rehabilitation

## 2016-01-15 ENCOUNTER — Encounter: Payer: Medicare Other | Attending: Physical Medicine & Rehabilitation | Admitting: Physical Medicine & Rehabilitation

## 2016-01-15 VITALS — BP 109/72 | HR 63

## 2016-01-15 DIAGNOSIS — M75101 Unspecified rotator cuff tear or rupture of right shoulder, not specified as traumatic: Secondary | ICD-10-CM | POA: Insufficient documentation

## 2016-01-15 DIAGNOSIS — M7581 Other shoulder lesions, right shoulder: Secondary | ICD-10-CM | POA: Diagnosis not present

## 2016-01-15 DIAGNOSIS — S46002S Unspecified injury of muscle(s) and tendon(s) of the rotator cuff of left shoulder, sequela: Secondary | ICD-10-CM

## 2016-01-15 DIAGNOSIS — G8191 Hemiplegia, unspecified affecting right dominant side: Secondary | ICD-10-CM | POA: Insufficient documentation

## 2016-01-15 DIAGNOSIS — G8929 Other chronic pain: Secondary | ICD-10-CM | POA: Diagnosis present

## 2016-01-15 DIAGNOSIS — M79661 Pain in right lower leg: Secondary | ICD-10-CM | POA: Insufficient documentation

## 2016-01-15 DIAGNOSIS — M7989 Other specified soft tissue disorders: Secondary | ICD-10-CM | POA: Insufficient documentation

## 2016-01-15 DIAGNOSIS — M75102 Unspecified rotator cuff tear or rupture of left shoulder, not specified as traumatic: Secondary | ICD-10-CM | POA: Diagnosis not present

## 2016-01-15 DIAGNOSIS — G8110 Spastic hemiplegia affecting unspecified side: Secondary | ICD-10-CM

## 2016-01-15 DIAGNOSIS — I2510 Atherosclerotic heart disease of native coronary artery without angina pectoris: Secondary | ICD-10-CM | POA: Diagnosis not present

## 2016-01-15 DIAGNOSIS — F329 Major depressive disorder, single episode, unspecified: Secondary | ICD-10-CM | POA: Insufficient documentation

## 2016-01-15 DIAGNOSIS — H532 Diplopia: Secondary | ICD-10-CM | POA: Insufficient documentation

## 2016-01-15 DIAGNOSIS — M1990 Unspecified osteoarthritis, unspecified site: Secondary | ICD-10-CM | POA: Diagnosis not present

## 2016-01-15 DIAGNOSIS — E785 Hyperlipidemia, unspecified: Secondary | ICD-10-CM | POA: Diagnosis not present

## 2016-01-15 DIAGNOSIS — I10 Essential (primary) hypertension: Secondary | ICD-10-CM | POA: Diagnosis not present

## 2016-01-15 NOTE — Progress Notes (Signed)
Subjective:    Patient ID: Kristin [name], female    DOB: 1941/01/11, 75 y.o.   MRN: [id number]  HPI   Kristin [name] is here in follow up of her chronic CVA and spastic right hemiparesis. She was in the ED for chest pain and work up was negative. She is still having some right knee and arm pain. She admits to letting her right arm dangle when she's in a chair. She has her harness on faithfully.   She received her knee brace form [facility], but it's been difficult to don/doff at times due to multiple straps. It also sometimes difficult with her existing brace in place.   Pain Inventory Average Pain 4 Pain Right Now 6 My pain is constant, dull and aching  In the last 24 hours, has pain interfered with the following? General activity 5 Relation with others 5 Enjoyment of life 5 What TIME of day is your pain at its worst? evening Sleep (in general) Good  Pain is worse with: walking and inactivity Pain improves with: rest and medication Relief from Meds: 1  Mobility use a walker ability to climb steps?  no do you drive?  no transfers alone Do you have any goals in this area?  no  Function disabled: date disabled 2001 I need assistance with the following:  meal prep, household duties and shopping  Neuro/Psych bladder control problems trouble walking  Prior Studies Any changes since last visit?  no  Physicians involved in your care Any changes since last visit?  no   Family History  Problem Relation Age of Onset  . Diabetes Mother   . Heart disease Mother   . Heart attack Father   . Breast cancer Sister    Social History   Social History  . Marital status: Married    Spouse name: N/A  . Number of children: 2  . Years of education: N/A   Occupational History  . retired Education officer, [employer]    Social History Main Topics  . Smoking status: Never Smoker  . Smokeless tobacco: Never Used  . Alcohol use 0.0 oz/week     Comment: glass of wine a day  . Drug use: No  . Sexual  activity: No   Other Topics Concern  . None   Social History Narrative  . None   Past Surgical History:  Procedure Laterality Date  . ABDOMINAL HYSTERECTOMY    . CAROTID STENT    . cataracts    . COLONOSCOPY N/A 10/05/2015   Procedure: COLONOSCOPY;  Surgeon: Manus Gunning, MD;  Location: [facility];  Service: Gastroenterology;  Laterality: N/A;  . CORONARY ARTERY BYPASS GRAFT N/A 12/17/2013   Procedure: CORONARY ARTERY BYPASS GRAFTING (CABG);  Surgeon: Gaye Pollack, MD;  Location: [place];  Service: Open Heart Surgery;  Laterality: N/A;  Times 2 using left internal mammary artery and endoscopically harvested right saphenous vein  . ESOPHAGOGASTRODUODENOSCOPY    . ESOPHAGOGASTRODUODENOSCOPY N/A 10/05/2015   Procedure: ESOPHAGOGASTRODUODENOSCOPY (EGD);  Surgeon: Manus Gunning, MD;  Location: [facility];  Service: Gastroenterology;  Laterality: N/A;  . INTRAOPERATIVE TRANSESOPHAGEAL ECHOCARDIOGRAM N/A 12/17/2013   Procedure: INTRAOPERATIVE TRANSESOPHAGEAL ECHOCARDIOGRAM;  Surgeon: Gaye Pollack, MD;  Location: [facility];  Service: Open Heart Surgery;  Laterality: N/A;  . lasix    . LEFT HEART CATHETERIZATION WITH CORONARY ANGIOGRAM N/A 12/13/2013   Procedure: LEFT HEART CATHETERIZATION WITH CORONARY ANGIOGRAM;  Surgeon: Jettie Booze, MD;  [facility];  Service: Cardiovascular;  Laterality: N/A;  .  retina injection     Past Medical History:  Diagnosis Date  . Arthritis    hands  . CAD (coronary artery disease)    a. 2010: stents pRCA and dRCA. b. LHC 12/13/13: Minimal distal LM dz. LAD 95% ostial w/ L-->R collat. LCx mild dz. RCA 95% mid-dRCA. Patent stents proximal and distal RCA. EF not assessed, Nl by nuclear study.     . Central retinal vein occlusion, right eye   . Depression   . Diplopia   . Ear pain    right  . Eosinophilic esophagitis   . Essential hypertension   . GERD (gastroesophageal reflux disease)   . H/O: whooping cough   .  Hyperlipidemia with target LDL less than 70   . Incontinence    urine  . Stroke () 2001   reesultant Rt spastic hemplegia and aphasia    BP 109/72   Pulse 63   SpO2 92%   Opioid Risk Score:   Fall Risk Score:  `1  Depression screen PHQ 2/9  Depression screen PHQ 2/9 11/11/2014  Decreased Interest 0  Down, Depressed, Hopeless 0  PHQ - 2 Score 0     Review of Systems  Constitutional: Negative.   HENT: Negative.   Eyes: Negative.   Respiratory: Negative.   Cardiovascular: Negative.   Gastrointestinal: Negative.   Endocrine: Negative.   Genitourinary: Negative.   Musculoskeletal: Negative.   Skin: Negative.   Allergic/Immunologic: Negative.   Neurological: Negative.   Hematological: Negative.   Psychiatric/Behavioral: Negative.        Objective:   Physical Exam  General: Alert , No apparent distress  HEENT: Head is normocephalic, atraumatic, PERRLA, EOMI, sclera anicteric, oral mucosa pink and moist, dentition intact, ext ear canals clear,  Neck: Supple without JVD or lymphadenopathy  Heart: Reg rate and rhythm. No murmurs rubs or gallops  Chest: CTA bilaterally without wheezes, rales, or rhonchi; no distress mild pain with palpation of sternum.  Abdomen: Soft, non-tender, non-distended, bowel sounds positive.  Extremities: No clubbing, cyanosis, or edema. Pulses are 2+  Skin: intact  Neuro: Continued aphasia. Is better with automatic phrases. I think her receptive language skills have improved a bit since I last saw her.. Has dense HP of RUE but with 1+ Bicep, pecs, deltoid now. Has 2-3/5 strength in the proximal RLE with AFO in place for distal control. She has mild resting tone in the right hand and wrist. She does have diminished pain sense on the right side but IS able to sense pain. She ambulated with less circumduction, recurvatum improved also.  Musculoskeletal: s  She has an almost 1 cm sublux as well of the right humerus from the glenoid fossa. Harness is  appropriately fitting today. She has tenderness with ir/er of left shoulder which is stable to improved. . Left biceps long head tendon appears the most tender. RLE with trace to 1+  edema.  Psych: Pt's affect is appropriate. Pt is cooperative   Assessment & Plan:   1. Hx of left MCA infarct with spastic right hemiparesis, expressive greater than receptive aphasia  2. . RLE pain and swelling. The more I see her, I feel this is most likely related to the OA in her right knee---I see no other obvious sources.  Her xray from 2014 shows mild OA.   4. Insomnia, anxiety  5. Constipation 6. Right shoulder pain related to chronic subluxation, mild adhesive capsulitis, chronic spasticity  7. OSA.  8. Left rotator cuff tendonitis/bursitis;biciptial tendonitis  on exam today  9. Right rotator cuff tendonitis/right shoulder sublux 10. ?neuropathic pain right side---on lyrica  11. Chest pain: likley musculoskeletal   Plan:  1. Ordered right knee brace and replacement of velcro right afo. Recommended supplements for joints also including tart red cherry extract, turmeric, glucosamine with chondroitin, and ginger.              -consider joint injection if needed  -would like her to try a different type of brace. I described to her daughter. 2. Ice/heat to chest  - 3. Continue right shoulder harness for support.   -right shoulder support.  4. Pacing and preservation for LUE. Provided her basic shoulder exercises.  5. Continue use of voltaren gel for the both the knee and the shoulder  6. I will see her back in 6 months. 30 minutes of face to face patient care time were spent during this visit. All questions were encouraged and answered.

## 2016-01-15 NOTE — Patient Instructions (Signed)
WORK ON KEEPING YOUR RIGHT ARM ELEVATED WHILE YOU SIT OR STAND.   KNEE BRACE --- LET ME KNOW IF YOU WOULD LIKE TO GO BACK TO [facility].   ICE OR HEAT FOR YOUR CHEST WHEN YOU HAVE PAIN.     PLEASE CALL ME WITH ANY PROBLEMS OR QUESTIONS [phone])

## 2016-02-07 ENCOUNTER — Encounter (INDEPENDENT_AMBULATORY_CARE_PROVIDER_SITE_OTHER): Payer: Medicare Other | Admitting: Ophthalmology

## 2016-02-07 DIAGNOSIS — I10 Essential (primary) hypertension: Secondary | ICD-10-CM | POA: Diagnosis not present

## 2016-02-07 DIAGNOSIS — H35033 Hypertensive retinopathy, bilateral: Secondary | ICD-10-CM | POA: Diagnosis not present

## 2016-02-07 DIAGNOSIS — H348112 Central retinal vein occlusion, right eye, stable: Secondary | ICD-10-CM | POA: Diagnosis not present

## 2016-02-07 DIAGNOSIS — H43813 Vitreous degeneration, bilateral: Secondary | ICD-10-CM

## 2016-02-08 ENCOUNTER — Encounter: Payer: Self-pay | Admitting: Cardiovascular Disease

## 2016-02-22 ENCOUNTER — Ambulatory Visit: Payer: Medicare Other | Admitting: Cardiovascular Disease

## 2016-02-22 NOTE — Progress Notes (Signed)
Cardiology Office Note    Date:  02/26/2016   ID:  Kristin [name], DOB 15-Dec-1940, MRN [id number]  PCP:  Kristin Lopes, MD  Cardiologist:  Dr. Burt Knack (Previously Dr. Mare Ferrari)  Chief Complaint: 3 month f/u for CAD  History of Present Illness:   Kristin [name] is a 75 y.o. femalewith PMH of HTN, HLD, CVA with expressive aphasia and R sided weakness, CAD s/p PCI and CABG x2 by Dr. Cyndia Bent 12/17/2013 (LIMA to LAD, SVG to PDA) and eosinophilic esophagitis who presented for follow up.   She was recently admitted in the hospital on 10/17/15  for chest pain. Her history was limited by expressive aphasia. Myoview obtained on 6/28 showed EF 68%, concern for reversible defect and inducible ischemia within the inferior wall, decreased wall motion in the septum likely related to the previous CABG, otherwise normal wall motion. This Myoview was reviewed by Dr. Burt Knack and 2 non-invasive cardiologists, who felt there was no obvious ischemia, eventually it was recommended for the patient to continue medical therapy. During the hospitalization, she did have some bradycardia with heart rate in the 40s to 50s. Her beta blocker was discontinued as she was already on the lowest possible dose.  She was doing well on cardiac stand point when last seen by Almyra Deforest 11/17/15.   Seen in ER 12/25/15 for chest pain.  Troponin was negative x 2. EKG without acute changes. Treated with abx for UTI and discharged home from ER.   Here today for follow up with step daughter. No complain today. Only had one episode of chest pain since admission in June that resolved with SL nitro x 1 and antiacid. She thinks that this is likely due to GERD vs MSK. She does group exercise 2-3 times/week and walks with walker multiple times in a day without angina or dyspnea. Compliant with medication and diet.  Past Medical History:  Diagnosis Date  . Arthritis    hands  . CAD (coronary artery disease)    a. 2010: stents pRCA and dRCA. b. LHC  12/13/13: Minimal distal LM dz. LAD 95% ostial w/ L-->R collat. LCx mild dz. RCA 95% mid-dRCA. Patent stents proximal and distal RCA. EF not assessed, Nl by nuclear study.     . Central retinal vein occlusion, right eye   . Depression   . Diplopia   . Ear pain    right  . Eosinophilic esophagitis   . Essential hypertension   . GERD (gastroesophageal reflux disease)   . H/O: whooping cough   . Hyperlipidemia with target LDL less than 70   . Incontinence    urine  . Stroke [facility]) 2001   reesultant Rt spastic hemplegia and aphasia     Past Surgical History:  Procedure Laterality Date  . ABDOMINAL HYSTERECTOMY    . CAROTID STENT    . cataracts    . COLONOSCOPY N/A 10/05/2015   Procedure: COLONOSCOPY;  Surgeon: Kristin Gunning, MD;  Location: [facility];  Service: Gastroenterology;  Laterality: N/A;  . CORONARY ARTERY BYPASS GRAFT N/A 12/17/2013   Procedure: CORONARY ARTERY BYPASS GRAFTING (CABG);  Surgeon: Kristin Pollack, MD;  Location: [place];  Service: Open Heart Surgery;  Laterality: N/A;  Times 2 using left internal mammary artery and endoscopically harvested right saphenous vein  . ESOPHAGOGASTRODUODENOSCOPY    . ESOPHAGOGASTRODUODENOSCOPY N/A 10/05/2015   Procedure: ESOPHAGOGASTRODUODENOSCOPY (EGD);  Surgeon: Kristin Gunning, MD;  Location: [facility];  Service: Gastroenterology;  Laterality: N/A;  . INTRAOPERATIVE TRANSESOPHAGEAL ECHOCARDIOGRAM  N/A 12/17/2013   Procedure: INTRAOPERATIVE TRANSESOPHAGEAL ECHOCARDIOGRAM;  Surgeon: Kristin Pollack, MD;  Location: [facility] OR;  Service: Open Heart Surgery;  Laterality: N/A;  . lasix    . LEFT HEART CATHETERIZATION WITH CORONARY ANGIOGRAM N/A 12/13/2013   Procedure: LEFT HEART CATHETERIZATION WITH CORONARY ANGIOGRAM;  Surgeon: Kristin Booze, MD;  Location: [facility] CATH LAB;  Service: Cardiovascular;  Laterality: N/A;  . retina injection      Current Medications: Prior to Admission medications   Medication Sig Start Date End  Date Taking? Authorizing Provider  amLODipine (NORVASC) 5 MG tablet Take 2.5 mg by mouth daily.     Historical Provider, MD  aspirin 81 MG tablet Take 81 mg by mouth daily.    Historical Provider, MD  atorvastatin (LIPITOR) 40 MG tablet Take 80 mg by mouth daily at 6 PM.     Historical Provider, MD  cephALEXin (KEFLEX) 500 MG capsule Take 1 capsule (500 mg total) by mouth 4 (four) times daily. 12/25/15   Kristin Pence, MD  clopidogrel (PLAVIX) 75 MG tablet Take 75 mg by mouth daily.    Historical Provider, MD  diclofenac sodium (VOLTAREN) 1 % GEL Apply 4 g topically 4 (four) times daily as needed (pain).     Historical Provider, MD  fluconazole (DIFLUCAN) 200 MG tablet 2 tabs by mouth on day 1, then 1 tab by mouth daily for 13 more days    Historical Provider, MD  nitroGLYCERIN (NITROSTAT) 0.4 MG SL tablet Place 1 tablet (0.4 mg total) under the tongue every 5 (five) minutes as needed for chest pain. 10/18/15   Kristin Reasons, MD  omeprazole (PRILOSEC) 20 MG capsule Take 20 mg by mouth daily.    Historical Provider, MD  oxybutynin (DITROPAN) 5 MG tablet Take 10 mg by mouth 2 (two) times daily.     Historical Provider, MD  pregabalin (LYRICA) 150 MG capsule Take 150 mg by mouth 2 (two) times daily.     Historical Provider, MD  sertraline (ZOLOFT) 50 MG tablet Take 75 mg by mouth every evening.     Historical Provider, MD  trimethoprim (TRIMPEX) 100 MG tablet Take 100 mg by mouth every evening.     Historical Provider, MD    Allergies:   Patient has no known allergies.   Social History   Social History  . Marital status: Married    Spouse name: N/A  . Number of children: 2  . Years of education: N/A   Occupational History  . retired Education officer, [employer]    Social History Main Topics  . Smoking status: Never Smoker  . Smokeless tobacco: Never Used  . Alcohol use 0.0 oz/week     Comment: glass of wine a day  . Drug use: No  . Sexual activity: No   Other Topics Concern  . None   Social History  Narrative  . None     Family History:  The patient's family history includes Breast cancer in her sister; Diabetes in her mother; Heart attack in her father; Heart disease in her mother.   ROS:   Please see the history of present illness.    ROS All other systems reviewed and are negative.   PHYSICAL EXAM:   VS:  BP 122/80   Pulse 73   Ht 5\' 7"  (1.702 m)   Wt 181 lb 9.6 oz (82.4 kg)   BMI 28.44 kg/m    GEN: Well nourished, well developed, in no acute distress  HEENT: normal  Neck:  no JVD, carotid bruits, or masses Cardiac:RRR; no murmurs, rubs, or gallops, Trace R lower extremity edema due to weakness Respiratory:  clear to auscultation bilaterally, normal work of breathing GI: soft, nontender, nondistended, + BS MS: no deformity or atrophy  Skin: warm and dry, no rash Neuro:  Alert and Oriented x 3, Expressive aphasia and R sided weakness Psych: euthymic mood, full affect  Wt Readings from Last 3 Encounters:  02/26/16 181 lb 9.6 oz (82.4 kg)  12/25/15 177 lb (80.3 kg)  11/17/15 184 lb 6.4 oz (83.6 kg)      Studies/Labs Reviewed:   EKG:  EKG is not ordered today.   Recent Labs: 12/25/2015: ALT 14; BUN 17; Creatinine, Ser 0.88; Hemoglobin 14.0; Platelets 198; Potassium 4.0; Sodium 139   Lipid Panel No results found for: CHOL, TRIG, HDL, CHOLHDL, VLDL, LDLCALC, LDLDIRECT  Additional studies/ records that were reviewed today include:   Echo 12/10/2013 LV EF: 65%  ------------------------------------------------------------------- Indications:   Chest pain 786.51.  ------------------------------------------------------------------- History:  PMH:  Coronary artery disease. Stroke. Risk factors: Dyslipidemia.  ------------------------------------------------------------------- Study Conclusions  - Left ventricle: The cavity size was normal. Wall thickness was normal. The estimated ejection fraction was 65%. Wall motion was normal; there were no  regional wall motion abnormalities. - Aortic valve: Sclerosis without stenosis. There was no significant regurgitation. - Left atrium: The atrium was mildly dilated. - Right ventricle: The cavity size was normal. Systolic function was normal.   Myoview 10/18/2015 IMPRESSION: 1. Concern for reversible defect and inducible ischemia within the inferior wall.  2. Decreased grip all motion in the septum, likely related to prior CABG. Otherwise normal wall motion.  3. Left ventricular ejection fraction 68%  4. Non invasive risk stratification*: Intermediate Note: this study has been overread by 2 noninvasive cardiologist who felt there was no ischemia and continue medical therapy  ASSESSMENT & PLAN:   1. CAD s/p CABG x 2 11/2013 - Recent Myoview 09/2015 interpreted as mild however over read by 2 noninvasive cardiologist who felt it was actually normal. No anginal pain or dyspnea. Continue current medical therapy.   2. HTN - Stable and well controlled.   3. HLD - Followed by PCP. Schedule to see 04/2016 with labs.   4. H/o CVA with expressive aphasia: stable    Medication Adjustments/Labs and Tests Ordered: Current medicines are reviewed at length with the patient today.  Concerns regarding medicines are outlined above.  Medication changes, Labs and Tests ordered today are listed in the Patient Instructions below. Patient Instructions  Medication Instructions:   Your physician recommends that you continue on your current medications as directed. Please refer to the Current Medication list given to you today.    If you need a refill on your cardiac medications before your next appointment, please call your pharmacy.  Labwork: NONE ORDERED  TODAY    Testing/Procedures: NONE ORDERED  TODAY   Follow-Up: Your physician wants you to follow-up in:  IN 6  MONTHS WITH DR Burt Knack   You will receive a reminder letter in the mail two months in advance. If you don't receive a  letter, please call our office to schedule the follow-up appointment.     Any Other Special Instructions Will Be Listed Below (If Applicable).  Jarrett Soho, [place]  02/26/2016 2:44 PM    [facility] [address], [place], [place]  [address] Phone: [phone]; Fax: [phone]

## 2016-02-26 ENCOUNTER — Ambulatory Visit (INDEPENDENT_AMBULATORY_CARE_PROVIDER_SITE_OTHER): Payer: Medicare Other | Admitting: Physician Assistant

## 2016-02-26 ENCOUNTER — Encounter: Payer: Self-pay | Admitting: Physician Assistant

## 2016-02-26 VITALS — BP 122/80 | HR 73 | Ht 67.0 in | Wt 181.6 lb

## 2016-02-26 DIAGNOSIS — I10 Essential (primary) hypertension: Secondary | ICD-10-CM | POA: Diagnosis not present

## 2016-02-26 DIAGNOSIS — E785 Hyperlipidemia, unspecified: Secondary | ICD-10-CM

## 2016-02-26 DIAGNOSIS — I25810 Atherosclerosis of coronary artery bypass graft(s) without angina pectoris: Secondary | ICD-10-CM | POA: Diagnosis not present

## 2016-02-26 DIAGNOSIS — I6930 Unspecified sequelae of cerebral infarction: Secondary | ICD-10-CM | POA: Diagnosis not present

## 2016-02-26 NOTE — Patient Instructions (Addendum)
Medication Instructions:   Your physician recommends that you continue on your current medications as directed. Please refer to the Current Medication list given to you today.  If you need a refill on your cardiac medications before your next appointment, please call your pharmacy.  Labwork: NONE ORDERED  TODAY    Testing/Procedures: NONE ORDERED  TODAY    Follow-Up:  Your physician wants you to follow-up in:  IN 6   MONTHS WITH DR COOPER   You will receive a reminder letter in the mail two months in advance. If you don't receive a letter, please call our office to schedule the follow-up appointment.      Any Other Special Instructions Will Be Listed Below (If Applicable).                                                                                                                                                   

## 2016-04-03 ENCOUNTER — Encounter: Payer: Self-pay | Admitting: Podiatry

## 2016-04-03 ENCOUNTER — Ambulatory Visit (INDEPENDENT_AMBULATORY_CARE_PROVIDER_SITE_OTHER): Payer: Medicare Other | Admitting: Podiatry

## 2016-04-03 DIAGNOSIS — B351 Tinea unguium: Secondary | ICD-10-CM | POA: Diagnosis not present

## 2016-04-03 DIAGNOSIS — M79605 Pain in left leg: Secondary | ICD-10-CM

## 2016-04-03 DIAGNOSIS — M79676 Pain in unspecified toe(s): Secondary | ICD-10-CM | POA: Diagnosis not present

## 2016-04-03 DIAGNOSIS — M79604 Pain in right leg: Secondary | ICD-10-CM

## 2016-04-03 DIAGNOSIS — L84 Corns and callosities: Secondary | ICD-10-CM | POA: Diagnosis not present

## 2016-04-03 NOTE — Progress Notes (Signed)
Subjective:     Patient ID: Kristin [name], female   DOB: [birth year], 75 y.o.   MRN: [id number]  HPI patient presents with caregiver with nail disease 1-5 both feet lesion third digit right that's painful with the nails been bothersome and impossible for them to cut with patient having history of stroke   Review of Systems     Objective:   Physical Exam Thick yellow brittle nailbeds 1-5 both feet that are painful with significant muscle strength loss and keratotic lesion third right    Assessment:     Mycotic nail infection with pain 1-5 both feet with lesion third right    Plan:     H&P conditions reviewed and debrided nailbeds 1-5 both feet and lesion right with slight bleeding that I applied Band-Aid to. Reappoint to recheck

## 2016-06-12 ENCOUNTER — Ambulatory Visit: Payer: Medicare Other

## 2016-07-09 ENCOUNTER — Encounter: Payer: Self-pay | Admitting: Physical Medicine & Rehabilitation

## 2016-07-09 ENCOUNTER — Encounter: Payer: Medicare Other | Attending: Physical Medicine & Rehabilitation | Admitting: Physical Medicine & Rehabilitation

## 2016-07-09 VITALS — BP 102/68 | HR 65

## 2016-07-09 DIAGNOSIS — M25512 Pain in left shoulder: Secondary | ICD-10-CM | POA: Diagnosis present

## 2016-07-09 DIAGNOSIS — Z951 Presence of aortocoronary bypass graft: Secondary | ICD-10-CM | POA: Insufficient documentation

## 2016-07-09 DIAGNOSIS — G8929 Other chronic pain: Secondary | ICD-10-CM | POA: Insufficient documentation

## 2016-07-09 DIAGNOSIS — G4733 Obstructive sleep apnea (adult) (pediatric): Secondary | ICD-10-CM | POA: Insufficient documentation

## 2016-07-09 DIAGNOSIS — M79604 Pain in right leg: Secondary | ICD-10-CM | POA: Diagnosis not present

## 2016-07-09 DIAGNOSIS — Z90710 Acquired absence of both cervix and uterus: Secondary | ICD-10-CM | POA: Diagnosis not present

## 2016-07-09 DIAGNOSIS — M7581 Other shoulder lesions, right shoulder: Secondary | ICD-10-CM | POA: Diagnosis not present

## 2016-07-09 DIAGNOSIS — M7989 Other specified soft tissue disorders: Secondary | ICD-10-CM | POA: Insufficient documentation

## 2016-07-09 DIAGNOSIS — Z8673 Personal history of transient ischemic attack (TIA), and cerebral infarction without residual deficits: Secondary | ICD-10-CM | POA: Insufficient documentation

## 2016-07-09 DIAGNOSIS — M1711 Unilateral primary osteoarthritis, right knee: Secondary | ICD-10-CM | POA: Diagnosis not present

## 2016-07-09 DIAGNOSIS — I10 Essential (primary) hypertension: Secondary | ICD-10-CM | POA: Diagnosis not present

## 2016-07-09 DIAGNOSIS — M7582 Other shoulder lesions, left shoulder: Secondary | ICD-10-CM | POA: Diagnosis not present

## 2016-07-09 DIAGNOSIS — G4700 Insomnia, unspecified: Secondary | ICD-10-CM | POA: Insufficient documentation

## 2016-07-09 DIAGNOSIS — Z955 Presence of coronary angioplasty implant and graft: Secondary | ICD-10-CM | POA: Insufficient documentation

## 2016-07-09 DIAGNOSIS — K219 Gastro-esophageal reflux disease without esophagitis: Secondary | ICD-10-CM | POA: Insufficient documentation

## 2016-07-09 DIAGNOSIS — R4701 Aphasia: Secondary | ICD-10-CM | POA: Diagnosis not present

## 2016-07-09 DIAGNOSIS — M25511 Pain in right shoulder: Secondary | ICD-10-CM | POA: Diagnosis present

## 2016-07-09 DIAGNOSIS — M791 Myalgia: Secondary | ICD-10-CM | POA: Insufficient documentation

## 2016-07-09 DIAGNOSIS — Z9889 Other specified postprocedural states: Secondary | ICD-10-CM | POA: Diagnosis not present

## 2016-07-09 DIAGNOSIS — S46002S Unspecified injury of muscle(s) and tendon(s) of the rotator cuff of left shoulder, sequela: Secondary | ICD-10-CM

## 2016-07-09 DIAGNOSIS — M545 Low back pain, unspecified: Secondary | ICD-10-CM

## 2016-07-09 DIAGNOSIS — F419 Anxiety disorder, unspecified: Secondary | ICD-10-CM | POA: Insufficient documentation

## 2016-07-09 DIAGNOSIS — E785 Hyperlipidemia, unspecified: Secondary | ICD-10-CM | POA: Diagnosis not present

## 2016-07-09 DIAGNOSIS — I2510 Atherosclerotic heart disease of native coronary artery without angina pectoris: Secondary | ICD-10-CM | POA: Insufficient documentation

## 2016-07-09 MED ORDER — PREGABALIN 75 MG PO CAPS: 75.0000 mg | ORAL_CAPSULE | Freq: Every day | ORAL | 2 refills | Status: DC

## 2016-07-09 NOTE — Progress Notes (Signed)
Subjective:    Patient ID: Kristin [name], female    DOB: 05-May-1940, 76 y.o.   MRN: [id number]  HPI   Tanita is back regardin gher left CVA and associated deficits. She complains of ongoing left and right shoulder pain although the pain is fairly manageable. She continues with regular stretches for left arm and tries to preserve the arm when she can. The right knee still can be painful when standing. Her nerve pain seems less intensive. Her daughter decreased her lyrica and hasn't noticed a big difference either.      Pain Inventory Average Pain 2 Pain Right Now 3 My pain is constant, sharp, dull, tingling and aching  In the last 24 hours, has pain interfered with the following? General activity 7 Relation with others 7 Enjoyment of life 7 What TIME of day is your pain at its worst? night Sleep (in general) Poor  Pain is worse with: walking and sitting Pain improves with: medication Relief from Meds: 2  Mobility use a walker ability to climb steps?  no do you drive?  no use a wheelchair  Function retired  Neuro/Psych bladder control problems depression  Prior Studies Any changes since last visit?  no  Physicians involved in your care Any changes since last visit?  no   Family History  Problem Relation Age of Onset  . Diabetes Mother   . Heart disease Mother   . Heart attack Father   . Breast cancer Sister    Social History   Social History  . Marital status: Married    Spouse name: N/A  . Number of children: 2  . Years of education: N/A   Occupational History  . retired Education officer, [employer]    Social History Main Topics  . Smoking status: Never Smoker  . Smokeless tobacco: Never Used  . Alcohol use 0.0 oz/week     Comment: glass of wine a day  . Drug use: No  . Sexual activity: No   Other Topics Concern  . Not on file   Social History Narrative  . No narrative on file   Past Surgical History:  Procedure Laterality Date  . ABDOMINAL HYSTERECTOMY     . CAROTID STENT    . cataracts    . COLONOSCOPY N/A 10/05/2015   Procedure: COLONOSCOPY;  Surgeon: Manus Gunning, MD;  Location: [facility];  Service: Gastroenterology;  Laterality: N/A;  . CORONARY ARTERY BYPASS GRAFT N/A 12/17/2013   Procedure: CORONARY ARTERY BYPASS GRAFTING (CABG);  Surgeon: Gaye Pollack, MD;  Location: [facility];  Service: Open Heart Surgery;  Laterality: N/A;  Times 2 using left internal mammary artery and endoscopically harvested right saphenous vein  . ESOPHAGOGASTRODUODENOSCOPY    . ESOPHAGOGASTRODUODENOSCOPY N/A 10/05/2015   Procedure: ESOPHAGOGASTRODUODENOSCOPY (EGD);  Surgeon: Manus Gunning, MD;  Location: [facility];  Service: Gastroenterology;  Laterality: N/A;  . INTRAOPERATIVE TRANSESOPHAGEAL ECHOCARDIOGRAM N/A 12/17/2013   Procedure: INTRAOPERATIVE TRANSESOPHAGEAL ECHOCARDIOGRAM;  Surgeon: Gaye Pollack, MD;  Location: [facility];  Service: Open Heart Surgery;  Laterality: N/A;  . lasix    . LEFT HEART CATHETERIZATION WITH CORONARY ANGIOGRAM N/A 12/13/2013   Procedure: LEFT HEART CATHETERIZATION WITH CORONARY ANGIOGRAM;  Surgeon: Jettie Booze, MD;  Location: [facility] CATH LAB;  Service: Cardiovascular;  Laterality: N/A;  . retina injection     Past Medical History:  Diagnosis Date  . Arthritis    hands  . CAD (coronary artery disease)    a. 2010: stents pRCA and dRCA.  b. LHC 12/13/13: Minimal distal LM dz. LAD 95% ostial w/ L-->R collat. LCx mild dz. RCA 95% mid-dRCA. Patent stents proximal and distal RCA. EF not assessed, Nl by nuclear study.     . Central retinal vein occlusion, right eye   . Depression   . Diplopia   . Ear pain    right  . Eosinophilic esophagitis   . Essential hypertension   . GERD (gastroesophageal reflux disease)   . H/O: whooping cough   . Hyperlipidemia with target LDL less than 70   . Incontinence    urine  . Stroke [facility]) 2001   reesultant Rt spastic hemplegia and aphasia    There were no vitals taken for  this visit.  Opioid Risk Score:   Fall Risk Score:  `1  Depression screen PHQ 2/9  Depression screen PHQ 2/9 11/11/2014  Decreased Interest 0  Down, Depressed, Hopeless 0  PHQ - 2 Score 0    Review of Systems  Constitutional: Negative.   HENT: Negative.   Eyes: Negative.   Respiratory: Negative.   Cardiovascular: Negative.   Gastrointestinal: Negative.   Endocrine: Negative.   Genitourinary: Negative.   Musculoskeletal: Negative.   Skin: Negative.   Allergic/Immunologic: Negative.   Neurological: Negative.   Hematological: Negative.   Psychiatric/Behavioral: Negative.   All other systems reviewed and are negative.      Objective:   Physical Exam  General: Alert , No apparent distress  HEENT:PERRL  Neck:Supple without JVD or lymphadenopathy  Heart:RRR  Chest:normal effort  Abdomen:Soft, non-tender, non-distended, bowel sounds positive.  Extremities:No clubbing, cyanosis, or edema. Pulses are 2+  Skin:intact  Neuro:Continued aphasia with improved receptive language and seems to be able to communicate more reliably from an expressive standpoint using automatic phrases, facial expressions and head nods. Has dense HP of RUE but with 1+ Bicep, pecs, deltoid now. Has 2-3/5 strength right HF, KE and trace distally..  Musculoskeletal:s  She has 0.5 to 1 cm sublux  of the right humerus from the glenoid fossa. Minimal tenderness with ROM and palpation of the left shoulder. Right knee with only mild tenderness with flexion/extension. Some crepitus noted.  Psych:Pt's affect is appropriate. Pt is cooperative   Assessment & Plan:  1. Hx of left MCA infarct with spastic right hemiparesis, expressive greater than receptive aphasia  2. . RLE pain and swelling. The more I see her, I feel this is most likely related to the OA in her right knee---I see no other obvious sources. Her xray from 2014 shows mild OA.  4. Insomnia, anxiety  5. Constipation 6. Right shoulder pain  related to chronic subluxation, mild adhesive capsulitis, chronic spasticity  7. OSA.  8. Left rotator cuff tendonitis/bursitis;biciptial tendonitis on exam today  9. Right rotator cuff tendonitis/right shoulder sublux 10. ?neuropathic pain right side---on lyrica 11. Chest pain: likley musculoskeletal   Plan:  1. Continue bracing of RLE as needed. Pain overall seems manageable at present   -reviewed again supplements for joints also including tart red cherry extract, turmeric, glucosamine with chondroitin, and ginger.    2. Wrote an order for custom orthotic shoes  3. Continue right shoulder harness for support.   4. Pacing and preservation for LUE. She seems to be doing well with these  5. Continue use of voltaren gel for the both the knee and the shoulder  6. I will see her back in 56months. 15 minutes of face to face patient care time were spent during this visit. All questions  were encouraged and answered.

## 2016-07-09 NOTE — Patient Instructions (Addendum)
PLEASE FEEL FREE TO CALL OUR OFFICE WITH ANY PROBLEMS OR QUESTIONS ([phone])    SUPPLEMENTS USEFUL FOR OSTEOARTHRITIS: OMEGA 3 FATTY ACIDS, TURMERIC, GINGER, TART CHERRY EXTRACT, CELERY SEED, GLUCOSAMINE WITH CHONDROITIN     

## 2016-07-10 ENCOUNTER — Encounter (INDEPENDENT_AMBULATORY_CARE_PROVIDER_SITE_OTHER): Payer: Medicare Other | Admitting: Ophthalmology

## 2016-07-15 ENCOUNTER — Encounter (INDEPENDENT_AMBULATORY_CARE_PROVIDER_SITE_OTHER): Payer: Medicare Other | Admitting: Ophthalmology

## 2016-07-15 DIAGNOSIS — H35033 Hypertensive retinopathy, bilateral: Secondary | ICD-10-CM | POA: Diagnosis not present

## 2016-07-15 DIAGNOSIS — H348112 Central retinal vein occlusion, right eye, stable: Secondary | ICD-10-CM | POA: Diagnosis not present

## 2016-07-15 DIAGNOSIS — I10 Essential (primary) hypertension: Secondary | ICD-10-CM | POA: Diagnosis not present

## 2016-07-15 DIAGNOSIS — H43813 Vitreous degeneration, bilateral: Secondary | ICD-10-CM | POA: Diagnosis not present

## 2016-08-20 DIAGNOSIS — R3915 Urgency of urination: Secondary | ICD-10-CM | POA: Insufficient documentation

## 2016-10-01 ENCOUNTER — Other Ambulatory Visit (HOSPITAL_COMMUNITY): Payer: Self-pay | Admitting: Internal Medicine

## 2016-10-01 ENCOUNTER — Inpatient Hospital Stay (HOSPITAL_COMMUNITY): Admission: RE | Admit: 2016-10-01 | Payer: Medicare Other | Source: Ambulatory Visit

## 2016-10-01 DIAGNOSIS — M79604 Pain in right leg: Secondary | ICD-10-CM

## 2016-10-08 ENCOUNTER — Ambulatory Visit (INDEPENDENT_AMBULATORY_CARE_PROVIDER_SITE_OTHER): Payer: Medicare Other | Admitting: Orthopedic Surgery

## 2016-10-08 ENCOUNTER — Ambulatory Visit (INDEPENDENT_AMBULATORY_CARE_PROVIDER_SITE_OTHER): Payer: Medicare Other

## 2016-10-08 ENCOUNTER — Encounter (INDEPENDENT_AMBULATORY_CARE_PROVIDER_SITE_OTHER): Payer: Self-pay | Admitting: Orthopedic Surgery

## 2016-10-08 VITALS — Ht 67.0 in | Wt 181.0 lb

## 2016-10-08 DIAGNOSIS — M25561 Pain in right knee: Secondary | ICD-10-CM | POA: Diagnosis not present

## 2016-10-08 DIAGNOSIS — M1711 Unilateral primary osteoarthritis, right knee: Secondary | ICD-10-CM

## 2016-10-08 DIAGNOSIS — G8929 Other chronic pain: Secondary | ICD-10-CM

## 2016-10-08 MED ORDER — LIDOCAINE HCL 1 % IJ SOLN
5.0000 mL | INTRAMUSCULAR | Status: AC | PRN
  Administered 2016-10-08: 5 mL

## 2016-10-08 MED ORDER — METHYLPREDNISOLONE ACETATE 40 MG/ML IJ SUSP
40.0000 mg | INTRAMUSCULAR | Status: AC | PRN
  Administered 2016-10-08: 40 mg via INTRA_ARTICULAR

## 2016-10-08 NOTE — Progress Notes (Signed)
Office Visit Note   Patient: Kristin [name]           Date of Birth: Dec 15, 1940           MRN: [id number] Visit Date: 10/08/2016              Requested by: Leanna Battles, [address] [place], [place] [address] PCP: Leanna Battles, MD  Chief Complaint  Patient presents with  . Right Knee - Pain      HPI: Patient is a 76 year old woman status post stroke involving the right upper and right lower extremity as well as affecting her speech, stroke in 2001. Who presents with mechanical pain in her right knee. She wears an ankle-foot orthosis on the right for foot drop. Patient is seen with her daughter and husband.  Assessment & Plan: Visit Diagnoses:  1. Chronic pain of right knee   2. Unilateral primary osteoarthritis, right knee     Plan: The right knee was injected discussed a week. Repeat steroid injections every 3 months if necessary. Discussed the possibility of hyaluronic acid injections but doubt this would provide much relief.  Follow-Up Instructions: Return if symptoms worsen or fail to improve.   Ortho Exam  Patient is alert, oriented, no adenopathy, well-dressed, normal affect, normal respiratory effort. Examination patient ambulates in a wheelchair. She has no redness or cellulitis around her knee. Collaterals and cruciate ligaments are stable. She is tender to palpation in the patellofemoral joint as well as medial and lateral joint line. There is a mild effusion.  Imaging: Xr Knee 1-2 Views Right  Result Date: 10/08/2016 2 view radiographs of the right knee shows subcondylar sclerosis with periarticular bony spurs including the patellofemoral joint.   Labs: Lab Results  Component Value Date   HGBA1C 5.8 (H) 12/16/2013   REPTSTATUS 12/27/2015 FINAL 12/25/2015   CULT MULTIPLE SPECIES PRESENT, SUGGEST RECOLLECTION (A) 12/25/2015    Orders:  Orders Placed This Encounter  Procedures  . XR Knee 1-2 Views Right   No orders of the defined types were  placed in this encounter.    Procedures: Large Joint Inj Date/Time: 10/08/2016 3:26 PM Performed by: DUDA, MARCUS V Authorized by: Newt Minion   Consent Given by:  Patient Site marked: the procedure site was marked   Timeout: prior to procedure the correct patient, procedure, and site was verified   Indications:  Pain and diagnostic evaluation Location:  Knee Site:  R knee Prep: patient was prepped and draped in usual sterile fashion   Needle Size:  22 G Needle Length:  1.5 inches Approach:  Anteromedial Ultrasound Guidance: No   Fluoroscopic Guidance: No   Arthrogram: No   Medications:  5 mL lidocaine 1 %; 40 mg methylPREDNISolone acetate 40 MG/ML Aspiration Attempted: No   Patient tolerance:  Patient tolerated the procedure well with no immediate complications    Clinical Data: No additional findings.  ROS:  All other systems negative, except as noted in the HPI. Review of Systems  Objective: Vital Signs: Ht 5\' 7"  (1.702 m)   Wt 181 lb (82.1 kg)   BMI 28.35 kg/m   Specialty Comments:  No specialty comments available.  PMFS History: Patient Active Problem List   Diagnosis Date Noted  . Chest pain syndrome   . Chest pain 10/17/2015  . IDA (iron deficiency anemia) 10/04/2015  . Right leg swelling 08/14/2015  . Right rotator cuff tendinitis 08/14/2015  . Biceps tendonitis on left 03/13/2015  . Injury of left rotator  cuff 08/17/2014  . Shoulder subluxation, right 07/06/2014  . S/P CABG x 2 12/17/2013  . Essential hypertension   . Unstable angina ([place]) 12/09/2013  . Atherosclerotic heart disease of native coronary artery with unstable angina pectoris: H/o PCI to RCA; Ostial LAD &dRCA severe stenosis. 12/09/2013  . Dyslipidemia, goal LDL below 70 12/09/2013  . Abnormal EKG - Anterior TWI  12/09/2013  . Open wound of scalp, without mention of complication 03/54/6568  . CVA 2001 06/01/2012  . Unilateral primary osteoarthritis, right knee 06/01/2012  .  Lumbago 06/01/2012  . Spastic hemiplegia affecting dominant side ([place]) 06/01/2012  . Aphasia as late effect of stroke 06/01/2012   Past Medical History:  Diagnosis Date  . Arthritis    hands  . CAD (coronary artery disease)    a. 2010: stents pRCA and dRCA. b. LHC 12/13/13: Minimal distal LM dz. LAD 95% ostial w/ L-->R collat. LCx mild dz. RCA 95% mid-dRCA. Patent stents proximal and distal RCA. EF not assessed, Nl by nuclear study.     . Central retinal vein occlusion, right eye   . Depression   . Diplopia   . Ear pain    right  . Eosinophilic esophagitis   . Essential hypertension   . GERD (gastroesophageal reflux disease)   . H/O: whooping cough   . Hyperlipidemia with target LDL less than 70   . Incontinence    urine  . Stroke [facility]) 2001   reesultant Rt spastic hemplegia and aphasia     Family History  Problem Relation Age of Onset  . Diabetes Mother   . Heart disease Mother   . Heart attack Father   . Breast cancer Sister     Past Surgical History:  Procedure Laterality Date  . ABDOMINAL HYSTERECTOMY    . CAROTID STENT    . cataracts    . COLONOSCOPY N/A 10/05/2015   Procedure: COLONOSCOPY;  Surgeon: Manus Gunning, MD;  Location: [facility];  Service: Gastroenterology;  Laterality: N/A;  . CORONARY ARTERY BYPASS GRAFT N/A 12/17/2013   Procedure: CORONARY ARTERY BYPASS GRAFTING (CABG);  Surgeon: Gaye Pollack, MD;  Location: [facility];  Service: Open Heart Surgery;  Laterality: N/A;  Times 2 using left internal mammary artery and endoscopically harvested right saphenous vein  . ESOPHAGOGASTRODUODENOSCOPY    . ESOPHAGOGASTRODUODENOSCOPY N/A 10/05/2015   Procedure: ESOPHAGOGASTRODUODENOSCOPY (EGD);  Surgeon: Manus Gunning, MD;  Location: [facility];  Service: Gastroenterology;  Laterality: N/A;  . INTRAOPERATIVE TRANSESOPHAGEAL ECHOCARDIOGRAM N/A 12/17/2013   Procedure: INTRAOPERATIVE TRANSESOPHAGEAL ECHOCARDIOGRAM;  Surgeon: Gaye Pollack, MD;  Location:  [facility];  Service: Open Heart Surgery;  Laterality: N/A;  . lasix    . LEFT HEART CATHETERIZATION WITH CORONARY ANGIOGRAM N/A 12/13/2013   Procedure: LEFT HEART CATHETERIZATION WITH CORONARY ANGIOGRAM;  Surgeon: Jettie Booze, MD;  Location: [facility] C[facility] LAB;  Service: Cardiovascular;  Laterality: N/A;  . retina injection     Social History   Occupational History  . retired Education officer, [employer]    Social History Main Topics  . Smoking status: Never Smoker  . Smokeless tobacco: Never Used  . Alcohol use 0.0 oz/week     Comment: glass of wine a day  . Drug use: No  . Sexual activity: No

## 2016-11-04 ENCOUNTER — Encounter (HOSPITAL_COMMUNITY): Payer: Medicare Other

## 2016-11-07 DIAGNOSIS — H6122 Impacted cerumen, left ear: Secondary | ICD-10-CM | POA: Insufficient documentation

## 2016-11-07 DIAGNOSIS — H9202 Otalgia, left ear: Secondary | ICD-10-CM | POA: Insufficient documentation

## 2016-12-26 DIAGNOSIS — R351 Nocturia: Secondary | ICD-10-CM | POA: Insufficient documentation

## 2017-01-06 ENCOUNTER — Encounter (INDEPENDENT_AMBULATORY_CARE_PROVIDER_SITE_OTHER): Payer: Medicare Other | Admitting: Ophthalmology

## 2017-01-09 DIAGNOSIS — N201 Calculus of ureter: Secondary | ICD-10-CM | POA: Insufficient documentation

## 2017-01-09 DIAGNOSIS — N393 Stress incontinence (female) (male): Secondary | ICD-10-CM | POA: Insufficient documentation

## 2017-01-21 ENCOUNTER — Encounter: Payer: Self-pay | Admitting: Physical Medicine & Rehabilitation

## 2017-01-21 ENCOUNTER — Encounter: Payer: Medicare Other | Attending: Physical Medicine & Rehabilitation | Admitting: Physical Medicine & Rehabilitation

## 2017-01-21 VITALS — BP 118/77 | HR 71 | Resp 14

## 2017-01-21 DIAGNOSIS — R079 Chest pain, unspecified: Secondary | ICD-10-CM | POA: Diagnosis not present

## 2017-01-21 DIAGNOSIS — M25511 Pain in right shoulder: Secondary | ICD-10-CM | POA: Insufficient documentation

## 2017-01-21 DIAGNOSIS — K5900 Constipation, unspecified: Secondary | ICD-10-CM | POA: Diagnosis not present

## 2017-01-21 DIAGNOSIS — G8929 Other chronic pain: Secondary | ICD-10-CM | POA: Diagnosis not present

## 2017-01-21 DIAGNOSIS — G4700 Insomnia, unspecified: Secondary | ICD-10-CM | POA: Diagnosis not present

## 2017-01-21 DIAGNOSIS — Z5189 Encounter for other specified aftercare: Secondary | ICD-10-CM | POA: Insufficient documentation

## 2017-01-21 DIAGNOSIS — S43001S Unspecified subluxation of right shoulder joint, sequela: Secondary | ICD-10-CM

## 2017-01-21 DIAGNOSIS — F419 Anxiety disorder, unspecified: Secondary | ICD-10-CM | POA: Diagnosis not present

## 2017-01-21 DIAGNOSIS — G8110 Spastic hemiplegia affecting unspecified side: Secondary | ICD-10-CM | POA: Diagnosis not present

## 2017-01-21 DIAGNOSIS — I69351 Hemiplegia and hemiparesis following cerebral infarction affecting right dominant side: Secondary | ICD-10-CM | POA: Diagnosis not present

## 2017-01-21 DIAGNOSIS — G4733 Obstructive sleep apnea (adult) (pediatric): Secondary | ICD-10-CM | POA: Diagnosis not present

## 2017-01-21 MED ORDER — PREGABALIN 25 MG PO CAPS: 25.0000 mg | ORAL_CAPSULE | Freq: Every day | ORAL | 3 refills | Status: DC

## 2017-01-21 NOTE — Patient Instructions (Signed)
CALL ME ABOUT THE ? OF GABAPENTIN OR NOT BASED ON HOW LYRICA TAPER GOES   LYRICA: 50MG  AT NIGHT FOR A WEEK THEN 25MG  UNTIL BOTTLE IS EMPTY THEN STOP    SUPPLEMENTS USEFUL FOR OSTEOARTHRITIS: OMEGA 3 FATTY ACIDS, TURMERIC, GINGER, TART CHERRY EXTRACT, CELERY SEED, GLUCOSAMINE WITH CHONDROITIN    ?SYNVISC INJECTION

## 2017-01-21 NOTE — Progress Notes (Signed)
Subjective:    Patient ID: Kristin [name], female    DOB: [birth year], 76 y.o.   MRN: [id number]  HPI   Lashia is here in follow up of her left CVA and associated deficits. She's had ongoing right knee pain. Dr. Sharol Given performed a knee injection in June without benefit. She has used a few otc supps and is currently on glucosamine. Her shoulder has been manageable. She may still be having neuropathic pain related to the stroke, but it is still not entirely clear given her language deficits. She is only using 75mg  lyrica currently.   Pain Inventory Average Pain 6 Pain Right Now 6 My pain is constant, sharp, dull and aching  In the last 24 hours, has pain interfered with the following? General activity 6 Relation with others 6 Enjoyment of life 6 What TIME of day is your pain at its worst? morning Sleep (in general) Fair  Pain is worse with: walking and standing Pain improves with: medication Relief from Meds: 2  Mobility walk with assistance use a walker ability to climb steps?  no do you drive?  no use a wheelchair needs help with transfers Do you have any goals in this area?  yes  Function disabled: date disabled . retired  Neuro/Psych trouble walking  Prior Studies Any changes since last visit?  no  Physicians involved in your care Any changes since last visit?  no   Family History  Problem Relation Age of Onset  . Diabetes Mother   . Heart disease Mother   . Heart attack Father   . Breast cancer Sister    Social History   Social History  . Marital status: Married    Spouse name: N/A  . Number of children: 2  . Years of education: N/A   Occupational History  . retired Education officer, museum    Social History Main Topics  . Smoking status: Never Smoker  . Smokeless tobacco: Never Used  . Alcohol use 0.0 oz/week     Comment: glass of wine a day  . Drug use: No  . Sexual activity: No   Other Topics Concern  . None   Social History Narrative  . None   Past  Surgical History:  Procedure Laterality Date  . ABDOMINAL HYSTERECTOMY    . CAROTID STENT    . cataracts    . COLONOSCOPY N/A 10/05/2015   Procedure: COLONOSCOPY;  Surgeon: Manus Gunning, MD;  Location: [facility];  Service: Gastroenterology;  Laterality: N/A;  . CORONARY ARTERY BYPASS GRAFT N/A 12/17/2013   Procedure: CORONARY ARTERY BYPASS GRAFTING (CABG);  Surgeon: Gaye Pollack, MD;  Location: [facility];  Service: Open Heart Surgery;  Laterality: N/A;  Times 2 using left internal mammary artery and endoscopically harvested right saphenous vein  . ESOPHAGOGASTRODUODENOSCOPY    . ESOPHAGOGASTRODUODENOSCOPY N/A 10/05/2015   Procedure: ESOPHAGOGASTRODUODENOSCOPY (EGD);  Surgeon: Manus Gunning, MD;  Location: [facility];  Service: Gastroenterology;  Laterality: N/A;  . INTRAOPERATIVE TRANSESOPHAGEAL ECHOCARDIOGRAM N/A 12/17/2013   Procedure: INTRAOPERATIVE TRANSESOPHAGEAL ECHOCARDIOGRAM;  Surgeon: Gaye Pollack, MD;  Location: [facility];  Service: Open Heart Surgery;  Laterality: N/A;  . lasix    . LEFT HEART CATHETERIZATION WITH CORONARY ANGIOGRAM N/A 12/13/2013   Procedure: LEFT HEART CATHETERIZATION WITH CORONARY ANGIOGRAM;  Surgeon: Jettie Booze, MD;  Location: [facility] CATH LAB;  Service: Cardiovascular;  Laterality: N/A;  . retina injection     Past Medical History:  Diagnosis Date  . Arthritis  hands  . CAD (coronary artery disease)    a. 2010: stents pRCA and dRCA. b. LHC 12/13/13: Minimal distal LM dz. LAD 95% ostial w/ L-->R collat. LCx mild dz. RCA 95% mid-dRCA. Patent stents proximal and distal RCA. EF not assessed, Nl by nuclear study.     . Central retinal vein occlusion, right eye   . Depression   . Diplopia   . Ear pain    right  . Eosinophilic esophagitis   . Essential hypertension   . GERD (gastroesophageal reflux disease)   . H/O: whooping cough   . Hyperlipidemia with target LDL less than 70   . Incontinence    urine  . Stroke ([place]) 2001    reesultant Rt spastic hemplegia and aphasia    BP 118/77 (BP Location: Left Arm, Patient Position: Sitting, Cuff Size: Normal)   Pulse 71   Resp 14   SpO2 93%   Opioid Risk Score:   Fall Risk Score:  `1  Depression screen PHQ 2/9  Depression screen PHQ 2/9 11/11/2014  Decreased Interest 0  Down, Depressed, Hopeless 0  PHQ - 2 Score 0    Review of Systems  Constitutional: Negative.   HENT: Negative.   Eyes: Negative.   Respiratory: Negative.   Cardiovascular: Negative.   Gastrointestinal: Negative.   Endocrine: Negative.   Genitourinary: Negative.   Musculoskeletal: Positive for arthralgias and gait problem.  Skin: Negative.   Allergic/Immunologic: Negative.   Hematological: Negative.   Psychiatric/Behavioral: Negative.        Objective:   Physical Exam General: Alert , No apparent distress  HEENT:PERRL  Neck:Supple without JVD or lymphadenopathy  Heart:RRR  Chest:normal effort Abdomen:Soft, non-tender, non-distended, bowel sounds positive.  Extremities:No clubbing, cyanosis, or edema. Pulses are 2+  Skin:intact  Neuro:ongoing aphasia, expressive. Very alert. Follows simple commands. . Has2-3/5 strength right HF, KE and trace distally..  Musculoskeletal:s She has  1 cm sublux  of the right humerus from the glenoid fossa--only mild pain with movement today.   . Right knee with mild tenderness with flexion/extension and ongoing crepitus. Arm and leg are not dysesthetic Psych:Pt's affect is pleasant  Assessment & Plan:  1. Hx of left MCA infarct with spastic right hemiparesis, expressive greater than receptive aphasia  2. . RLE pain and swelling. The more I see her, I feel this is most likely related to the OA in her right knee---  -did not respond to recent steroid injection 4. Insomnia, anxiety  5. Constipation 6. Right shoulder pain related to chronic subluxation, mild adhesive capsulitis, chronic spasticity  7. OSA.  8. Left rotator cuff  tendonitis/bursitis;biciptial tendonitis --generally improved.  9. Right rotator cuff tendonitis/right shoulder sublux 10. ?neuropathic pain right side---on lyrica currently  11. Chest pain: likley musculoskeletal   Plan:  1. Continue bracing of RLE as needed.                -REVIEWED joint supps today agin  -consider synvisc injection   -voltaren gel for kjnee   -knee will be an ongoing problem given her lack of control. Knee brace is not practical for her.  2. Wrote an order for custom orthotic shoes  3. Continue right shoulder harness for support.   4. Pacing and preservation for LUE. She seems to be doing well with these  5. Wean off lyrica. Consider trial of gabapentin once off based on how she feels.   6. I will see her back in 15months. 15 minutes of face to face  patient care time were spent during this visit. All questions were encouraged and answered.

## 2017-01-22 ENCOUNTER — Encounter (INDEPENDENT_AMBULATORY_CARE_PROVIDER_SITE_OTHER): Payer: Medicare Other | Admitting: Ophthalmology

## 2017-01-22 DIAGNOSIS — H348112 Central retinal vein occlusion, right eye, stable: Secondary | ICD-10-CM

## 2017-01-22 DIAGNOSIS — H43813 Vitreous degeneration, bilateral: Secondary | ICD-10-CM

## 2017-01-22 DIAGNOSIS — H35033 Hypertensive retinopathy, bilateral: Secondary | ICD-10-CM

## 2017-01-22 DIAGNOSIS — I10 Essential (primary) hypertension: Secondary | ICD-10-CM

## 2017-02-04 DIAGNOSIS — I6930 Unspecified sequelae of cerebral infarction: Secondary | ICD-10-CM | POA: Insufficient documentation

## 2017-02-04 DIAGNOSIS — Z8673 Personal history of transient ischemic attack (TIA), and cerebral infarction without residual deficits: Secondary | ICD-10-CM | POA: Insufficient documentation

## 2017-02-24 ENCOUNTER — Emergency Department (HOSPITAL_COMMUNITY): Payer: Medicare Other

## 2017-02-24 ENCOUNTER — Encounter (HOSPITAL_COMMUNITY): Payer: Self-pay | Admitting: *Deleted

## 2017-02-24 ENCOUNTER — Inpatient Hospital Stay (HOSPITAL_COMMUNITY)
Admission: EM | Admit: 2017-02-24 | Discharge: 2017-02-27 | DRG: 683 | Payer: Medicare Other | Attending: Internal Medicine | Admitting: Internal Medicine

## 2017-02-24 ENCOUNTER — Other Ambulatory Visit: Payer: Self-pay

## 2017-02-24 DIAGNOSIS — I69351 Hemiplegia and hemiparesis following cerebral infarction affecting right dominant side: Secondary | ICD-10-CM

## 2017-02-24 DIAGNOSIS — E870 Hyperosmolality and hypernatremia: Secondary | ICD-10-CM | POA: Diagnosis present

## 2017-02-24 DIAGNOSIS — N179 Acute kidney failure, unspecified: Secondary | ICD-10-CM | POA: Diagnosis present

## 2017-02-24 DIAGNOSIS — I69320 Aphasia following cerebral infarction: Secondary | ICD-10-CM

## 2017-02-24 DIAGNOSIS — R109 Unspecified abdominal pain: Secondary | ICD-10-CM

## 2017-02-24 DIAGNOSIS — D473 Essential (hemorrhagic) thrombocythemia: Secondary | ICD-10-CM | POA: Diagnosis present

## 2017-02-24 DIAGNOSIS — K219 Gastro-esophageal reflux disease without esophagitis: Secondary | ICD-10-CM | POA: Diagnosis present

## 2017-02-24 DIAGNOSIS — Z7902 Long term (current) use of antithrombotics/antiplatelets: Secondary | ICD-10-CM

## 2017-02-24 DIAGNOSIS — N200 Calculus of kidney: Secondary | ICD-10-CM | POA: Diagnosis present

## 2017-02-24 DIAGNOSIS — Z955 Presence of coronary angioplasty implant and graft: Secondary | ICD-10-CM | POA: Diagnosis not present

## 2017-02-24 DIAGNOSIS — I4581 Long QT syndrome: Secondary | ICD-10-CM | POA: Diagnosis present

## 2017-02-24 DIAGNOSIS — R402412 Glasgow coma scale score 13-15, at arrival to emergency department: Secondary | ICD-10-CM | POA: Diagnosis present

## 2017-02-24 DIAGNOSIS — R197 Diarrhea, unspecified: Secondary | ICD-10-CM | POA: Diagnosis not present

## 2017-02-24 DIAGNOSIS — I10 Essential (primary) hypertension: Secondary | ICD-10-CM | POA: Diagnosis present

## 2017-02-24 DIAGNOSIS — Z87448 Personal history of other diseases of urinary system: Secondary | ICD-10-CM

## 2017-02-24 DIAGNOSIS — G8110 Spastic hemiplegia affecting unspecified side: Secondary | ICD-10-CM | POA: Diagnosis present

## 2017-02-24 DIAGNOSIS — Z87442 Personal history of urinary calculi: Secondary | ICD-10-CM

## 2017-02-24 DIAGNOSIS — B965 Pseudomonas (aeruginosa) (mallei) (pseudomallei) as the cause of diseases classified elsewhere: Secondary | ICD-10-CM | POA: Diagnosis present

## 2017-02-24 DIAGNOSIS — K529 Noninfective gastroenteritis and colitis, unspecified: Secondary | ICD-10-CM

## 2017-02-24 DIAGNOSIS — Z79899 Other long term (current) drug therapy: Secondary | ICD-10-CM

## 2017-02-24 DIAGNOSIS — R9431 Abnormal electrocardiogram [ECG] [EKG]: Secondary | ICD-10-CM | POA: Diagnosis not present

## 2017-02-24 DIAGNOSIS — N390 Urinary tract infection, site not specified: Secondary | ICD-10-CM | POA: Diagnosis present

## 2017-02-24 DIAGNOSIS — D751 Secondary polycythemia: Secondary | ICD-10-CM | POA: Diagnosis present

## 2017-02-24 DIAGNOSIS — M19041 Primary osteoarthritis, right hand: Secondary | ICD-10-CM | POA: Diagnosis present

## 2017-02-24 DIAGNOSIS — E860 Dehydration: Secondary | ICD-10-CM | POA: Diagnosis present

## 2017-02-24 DIAGNOSIS — E876 Hypokalemia: Secondary | ICD-10-CM | POA: Diagnosis not present

## 2017-02-24 DIAGNOSIS — N3001 Acute cystitis with hematuria: Secondary | ICD-10-CM | POA: Insufficient documentation

## 2017-02-24 DIAGNOSIS — M19042 Primary osteoarthritis, left hand: Secondary | ICD-10-CM | POA: Diagnosis present

## 2017-02-24 DIAGNOSIS — I6330 Cerebral infarction due to thrombosis of unspecified cerebral artery: Secondary | ICD-10-CM | POA: Diagnosis present

## 2017-02-24 DIAGNOSIS — Z8619 Personal history of other infectious and parasitic diseases: Secondary | ICD-10-CM

## 2017-02-24 DIAGNOSIS — Z960 Presence of urogenital implants: Secondary | ICD-10-CM | POA: Diagnosis present

## 2017-02-24 DIAGNOSIS — E785 Hyperlipidemia, unspecified: Secondary | ICD-10-CM | POA: Diagnosis present

## 2017-02-24 DIAGNOSIS — I2510 Atherosclerotic heart disease of native coronary artery without angina pectoris: Secondary | ICD-10-CM | POA: Diagnosis present

## 2017-02-24 DIAGNOSIS — F329 Major depressive disorder, single episode, unspecified: Secondary | ICD-10-CM | POA: Diagnosis present

## 2017-02-24 DIAGNOSIS — Z8669 Personal history of other diseases of the nervous system and sense organs: Secondary | ICD-10-CM

## 2017-02-24 HISTORY — DX: Chronic kidney disease, unspecified: N18.9

## 2017-02-24 LAB — URINALYSIS, ROUTINE W REFLEX MICROSCOPIC
Bilirubin Urine: NEGATIVE
Glucose, UA: NEGATIVE mg/dL
Ketones, ur: 20 mg/dL — AB
Nitrite: POSITIVE — AB
Protein, ur: >=300 mg/dL — AB
Specific Gravity, Urine: 1.025 (ref 1.005–1.030)
pH: 5 (ref 5.0–8.0)

## 2017-02-24 LAB — COMPREHENSIVE METABOLIC PANEL
ALT: 14 U/L (ref 14–54)
AST: 15 U/L (ref 15–41)
Albumin: 3.7 g/dL (ref 3.5–5.0)
Alkaline Phosphatase: 98 U/L (ref 38–126)
Anion gap: 15 (ref 5–15)
BUN: 30 mg/dL — ABNORMAL HIGH (ref 6–20)
CO2: 22 mmol/L (ref 22–32)
Calcium: 9.1 mg/dL (ref 8.9–10.3)
Chloride: 109 mmol/L (ref 101–111)
Creatinine, Ser: 1.23 mg/dL — ABNORMAL HIGH (ref 0.44–1.00)
GFR calc Af Amer: 47 mL/min — ABNORMAL LOW (ref >60)
GFR calc non Af Amer: 41 mL/min — ABNORMAL LOW (ref >60)
Glucose, Bld: 131 mg/dL — ABNORMAL HIGH (ref 65–99)
Potassium: 2.6 mmol/L — CL (ref 3.5–5.1)
Sodium: 146 mmol/L — ABNORMAL HIGH (ref 135–145)
Total Bilirubin: 1.3 mg/dL — ABNORMAL HIGH (ref 0.3–1.2)
Total Protein: 7.2 g/dL (ref 6.5–8.1)

## 2017-02-24 LAB — CBC WITH DIFFERENTIAL/PLATELET
Basophils Absolute: 0 10*3/uL (ref 0.0–0.1)
Basophils Relative: 0 %
Eosinophils Absolute: 0 10*3/uL (ref 0.0–0.7)
Eosinophils Relative: 0 %
HCT: 46.7 % — ABNORMAL HIGH (ref 36.0–46.0)
Hemoglobin: 15.1 g/dL — ABNORMAL HIGH (ref 12.0–15.0)
Lymphocytes Relative: 3 %
Lymphs Abs: 0.6 10*3/uL — ABNORMAL LOW (ref 0.7–4.0)
MCH: 29.3 pg (ref 26.0–34.0)
MCHC: 32.3 g/dL (ref 30.0–36.0)
MCV: 90.7 fL (ref 78.0–100.0)
Monocytes Absolute: 1.1 10*3/uL — ABNORMAL HIGH (ref 0.1–1.0)
Monocytes Relative: 5 %
Neutro Abs: 21.3 10*3/uL — ABNORMAL HIGH (ref 1.7–7.7)
Neutrophils Relative %: 92 %
Platelets: 420 10*3/uL — ABNORMAL HIGH (ref 150–400)
RBC: 5.15 MIL/uL — ABNORMAL HIGH (ref 3.87–5.11)
RDW: 14.4 % (ref 11.5–15.5)
WBC: 23.1 10*3/uL — ABNORMAL HIGH (ref 4.0–10.5)

## 2017-02-24 LAB — CBC
HCT: 41.5 % (ref 36.0–46.0)
Hemoglobin: 13.6 g/dL (ref 12.0–15.0)
MCH: 29.7 pg (ref 26.0–34.0)
MCHC: 32.8 g/dL (ref 30.0–36.0)
MCV: 90.6 fL (ref 78.0–100.0)
Platelets: 380 10*3/uL (ref 150–400)
RBC: 4.58 MIL/uL (ref 3.87–5.11)
RDW: 14.5 % (ref 11.5–15.5)
WBC: 18.6 10*3/uL — ABNORMAL HIGH (ref 4.0–10.5)

## 2017-02-24 LAB — I-STAT CG4 LACTIC ACID, ED: Lactic Acid, Venous: 1.76 mmol/L (ref 0.5–1.9)

## 2017-02-24 MED ORDER — DEXTROSE 5 % IV SOLN
2.0000 g | INTRAVENOUS | Status: DC
  Administered 2017-02-25: 2 g via INTRAVENOUS
  Filled 2017-02-24: qty 2

## 2017-02-24 MED ORDER — MAGNESIUM SULFATE 2 GM/50ML IV SOLN
2.0000 g | Freq: Once | INTRAVENOUS | Status: AC
  Administered 2017-02-25: 2 g via INTRAVENOUS
  Filled 2017-02-24: qty 50

## 2017-02-24 MED ORDER — CLOPIDOGREL BISULFATE 75 MG PO TABS
75.0000 mg | ORAL_TABLET | Freq: Every day | ORAL | Status: DC
  Administered 2017-02-25 – 2017-02-27 (×3): 75 mg via ORAL
  Filled 2017-02-24 (×3): qty 1

## 2017-02-24 MED ORDER — FENTANYL CITRATE (PF) 100 MCG/2ML IJ SOLN
50.0000 ug | Freq: Once | INTRAMUSCULAR | Status: AC
  Administered 2017-02-24: 50 ug via INTRAVENOUS
  Filled 2017-02-24: qty 2

## 2017-02-24 MED ORDER — METRONIDAZOLE IN NACL 5-0.79 MG/ML-% IV SOLN
500.0000 mg | Freq: Once | INTRAVENOUS | Status: AC
  Administered 2017-02-25: 500 mg via INTRAVENOUS
  Filled 2017-02-24: qty 100

## 2017-02-24 MED ORDER — OXYBUTYNIN CHLORIDE ER 10 MG PO TB24
10.0000 mg | ORAL_TABLET | Freq: Two times a day (BID) | ORAL | Status: DC
  Administered 2017-02-25 – 2017-02-27 (×6): 10 mg via ORAL
  Filled 2017-02-24 (×6): qty 1

## 2017-02-24 MED ORDER — PREGABALIN 25 MG PO CAPS
25.0000 mg | ORAL_CAPSULE | Freq: Every day | ORAL | Status: DC
  Administered 2017-02-25 – 2017-02-26 (×3): 50 mg via ORAL
  Filled 2017-02-24 (×3): qty 2

## 2017-02-24 MED ORDER — DEXTROSE 5 % IV SOLN
2.0000 g | Freq: Once | INTRAVENOUS | Status: AC
  Administered 2017-02-24: 2 g via INTRAVENOUS
  Filled 2017-02-24: qty 2

## 2017-02-24 MED ORDER — CEFTRIAXONE SODIUM 1 G IJ SOLR: 1.0000 g | INTRAMUSCULAR | Status: DC

## 2017-02-24 MED ORDER — METRONIDAZOLE IN NACL 5-0.79 MG/ML-% IV SOLN: 500.0000 mg | Freq: Three times a day (TID) | INTRAVENOUS | Status: DC

## 2017-02-24 MED ORDER — ATORVASTATIN CALCIUM 20 MG PO TABS
20.0000 mg | ORAL_TABLET | Freq: Every day | ORAL | Status: DC
  Administered 2017-02-25 – 2017-02-26 (×2): 20 mg via ORAL
  Filled 2017-02-24 (×2): qty 1

## 2017-02-24 MED ORDER — ACETAMINOPHEN 325 MG PO TABS: 650.0000 mg | ORAL_TABLET | Freq: Four times a day (QID) | ORAL | Status: DC | PRN

## 2017-02-24 MED ORDER — CIPROFLOXACIN IN D5W 400 MG/200ML IV SOLN: 400.0000 mg | Freq: Two times a day (BID) | INTRAVENOUS | Status: DC

## 2017-02-24 MED ORDER — ONDANSETRON HCL 4 MG/2ML IJ SOLN: 4.0000 mg | Freq: Four times a day (QID) | INTRAMUSCULAR | Status: DC | PRN

## 2017-02-24 MED ORDER — CEFTRIAXONE SODIUM 2 G IJ SOLR: 2.0000 g | Freq: Once | INTRAMUSCULAR | Status: DC

## 2017-02-24 MED ORDER — ACETAMINOPHEN 650 MG RE SUPP: 650.0000 mg | Freq: Four times a day (QID) | RECTAL | Status: DC | PRN

## 2017-02-24 MED ORDER — DESMOPRESSIN ACETATE 0.1 MG PO TABS
0.2000 mg | ORAL_TABLET | Freq: Every day | ORAL | Status: DC
  Administered 2017-02-25 – 2017-02-26 (×3): 0.2 mg via ORAL
  Filled 2017-02-24: qty 1
  Filled 2017-02-24 (×3): qty 2

## 2017-02-24 MED ORDER — PANTOPRAZOLE SODIUM 40 MG PO TBEC: 40.0000 mg | DELAYED_RELEASE_TABLET | Freq: Every day | ORAL | Status: DC

## 2017-02-24 MED ORDER — ASPIRIN EC 81 MG PO TBEC
81.0000 mg | DELAYED_RELEASE_TABLET | Freq: Every day | ORAL | Status: DC
  Administered 2017-02-25 – 2017-02-27 (×3): 81 mg via ORAL
  Filled 2017-02-24 (×3): qty 1

## 2017-02-24 MED ORDER — HEPARIN SODIUM (PORCINE) 5000 UNIT/ML IJ SOLN
5000.0000 [IU] | Freq: Three times a day (TID) | INTRAMUSCULAR | Status: DC
  Administered 2017-02-25 (×2): 5000 [IU] via SUBCUTANEOUS
  Filled 2017-02-24 (×2): qty 1

## 2017-02-24 MED ORDER — SODIUM CHLORIDE 0.9 % IV BOLUS (SEPSIS)
1000.0000 mL | Freq: Once | INTRAVENOUS | Status: AC
  Administered 2017-02-24: 1000 mL via INTRAVENOUS

## 2017-02-24 MED ORDER — ONDANSETRON HCL 4 MG PO TABS: 4.0000 mg | ORAL_TABLET | Freq: Four times a day (QID) | ORAL | Status: DC | PRN

## 2017-02-24 MED ORDER — IOPAMIDOL (ISOVUE-300) INJECTION 61%
INTRAVENOUS | Status: AC
  Administered 2017-02-24: 100 mL
  Filled 2017-02-24: qty 100

## 2017-02-24 MED ORDER — POTASSIUM CHLORIDE CRYS ER 20 MEQ PO TBCR
20.0000 meq | EXTENDED_RELEASE_TABLET | Freq: Two times a day (BID) | ORAL | Status: DC
  Administered 2017-02-25: 20 meq via ORAL
  Filled 2017-02-24: qty 1

## 2017-02-24 MED ORDER — DICLOFENAC SODIUM 1 % TD GEL
4.0000 g | Freq: Four times a day (QID) | TRANSDERMAL | Status: DC | PRN
  Filled 2017-02-24: qty 100

## 2017-02-24 MED ORDER — AMLODIPINE BESYLATE 5 MG PO TABS
2.5000 mg | ORAL_TABLET | Freq: Every day | ORAL | Status: DC
  Administered 2017-02-25 – 2017-02-27 (×3): 2.5 mg via ORAL
  Filled 2017-02-24 (×3): qty 1

## 2017-02-24 MED ORDER — POTASSIUM CHLORIDE 10 MEQ/100ML IV SOLN
10.0000 meq | INTRAVENOUS | Status: AC
  Administered 2017-02-24 (×2): 10 meq via INTRAVENOUS
  Filled 2017-02-24 (×2): qty 100

## 2017-02-24 MED ORDER — LACTATED RINGERS IV BOLUS (SEPSIS)
1000.0000 mL | Freq: Once | INTRAVENOUS | Status: AC
  Administered 2017-02-24: 1000 mL via INTRAVENOUS

## 2017-02-24 MED ORDER — POTASSIUM CHLORIDE IN NACL 40-0.9 MEQ/L-% IV SOLN
INTRAVENOUS | Status: DC
  Administered 2017-02-25: 110 mL/h via INTRAVENOUS
  Filled 2017-02-24: qty 1000

## 2017-02-24 MED ORDER — HYDROCODONE-ACETAMINOPHEN 5-325 MG PO TABS: 1.0000 | ORAL_TABLET | ORAL | Status: DC | PRN

## 2017-02-24 MED ORDER — SERTRALINE HCL 50 MG PO TABS: 75.0000 mg | ORAL_TABLET | Freq: Every evening | ORAL | Status: DC

## 2017-02-24 NOTE — ED Provider Notes (Signed)
[facility] DEPT Provider Note   CSN: [id number] Arrival date & time: 02/24/17  1415     History   Chief Complaint Chief Complaint  Patient presents with  . Diarrhea    HPI Kristin [name] is a 76 y.o. female.  76yo F w/ PMH including CVA w/ R sided weakness and aphasia, CAD, kidney stones s/p stenting, HTN, HLD who p/w diarrhea.  The patient has had 2 episodes of diarrhea today and became worried about dehydration.  She had stents placed for kidney stones approximately 2 weeks ago and family notes that she has had ongoing pain since the stent placement.  She was initially on oxycodone then switched to tramadol and son-in-law is not sure whether her husband has been appropriately administrating the pain medicines on a schedule.  She has complained of lower abdominal pain.  No vomiting or fevers.  LEVEL 5 CAVEAT DUE TO APHASIA   The history is provided by the patient, the EMS personnel and a relative.  Diarrhea      Past Medical History:  Diagnosis Date  . Arthritis    hands  . CAD (coronary artery disease)    a. 2010: stents pRCA and dRCA. b. LHC 12/13/13: Minimal distal LM dz. LAD 95% ostial w/ L-->R collat. LCx mild dz. RCA 95% mid-dRCA. Patent stents proximal and distal RCA. EF not assessed, Nl by nuclear study.     . Central retinal vein occlusion, right eye   . Depression   . Diplopia   . Ear pain    right  . Eosinophilic esophagitis   . Essential hypertension   . GERD (gastroesophageal reflux disease)   . H/O: whooping cough   . Hyperlipidemia with target LDL less than 70   . Incontinence    urine  . Stroke [facility]) 2001   reesultant Rt spastic hemplegia and aphasia     Patient Active Problem List   Diagnosis Date Noted  . Chest pain syndrome   . Chest pain 10/17/2015  . IDA (iron deficiency anemia) 10/04/2015  . Right leg swelling 08/14/2015  . Right rotator cuff tendinitis 08/14/2015  . Biceps tendonitis on left 03/13/2015  .  Injury of left rotator cuff 08/17/2014  . Shoulder subluxation, right 07/06/2014  . S/P CABG x 2 12/17/2013  . Essential hypertension   . Unstable angina ([place]) 12/09/2013  . Atherosclerotic heart disease of native coronary artery with unstable angina pectoris: H/o PCI to RCA; Ostial LAD &dRCA severe stenosis. 12/09/2013  . Dyslipidemia, goal LDL below 70 12/09/2013  . Abnormal EKG - Anterior TWI  12/09/2013  . Open wound of scalp, without mention of complication 61/95/0932  . CVA 2001 06/01/2012  . Unilateral primary osteoarthritis, right knee 06/01/2012  . Lumbago 06/01/2012  . Spastic hemiplegia affecting dominant side ([place]) 06/01/2012  . Aphasia as late effect of stroke 06/01/2012    Past Surgical History:  Procedure Laterality Date  . ABDOMINAL HYSTERECTOMY    . CAROTID STENT    . cataracts    . ESOPHAGOGASTRODUODENOSCOPY    . lasix    . retina injection      OB History    No data available       Home Medications    Prior to Admission medications   Medication Sig Start Date End Date Taking? Authorizing Provider  Coenzyme Q10 (COQ-10 PO) Take 1 tablet daily by mouth.   Yes [provider]  ferrous sulfate 325 (65 FE) MG tablet Take 325 mg daily  with breakfast by mouth.   Yes [provider]  Wheat Dextrin (BENEFIBER DRINK MIX PO) Take 15 mLs daily by mouth.   Yes [provider]  amLODipine (NORVASC) 2.5 MG tablet Take 2.5 mg by mouth daily.    [provider]  aspirin 81 MG tablet Take 81 mg by mouth daily.    [provider]  atorvastatin (LIPITOR) 20 MG tablet Take 20 mg daily at 6 PM by mouth.     [provider]  clopidogrel (PLAVIX) 75 MG tablet Take 75 mg by mouth daily.    [provider]  desmopressin (DDAVP) 0.2 MG tablet Take 0.2 mg by mouth daily.    [provider]  diclofenac sodium (VOLTAREN) 1 % GEL Apply 4 g topically 4 (four) times daily as needed (pain).     [provider]  losartan (COZAAR) 25 MG tablet Take 25 mg by mouth daily.    [provider]  nitroGLYCERIN (NITROSTAT) 0.4 MG SL tablet Place 1 tablet (0.4 mg total) under the tongue every 5 (five) minutes as needed for chest pain. 10/18/15   Florencia Reasons, MD  omeprazole (PRILOSEC) 20 MG capsule Take 20 mg by mouth daily.    [provider]  oxybutynin (DITROPAN-XL) 10 MG 24 hr tablet Take 10 mg by mouth 2 (two) times daily.    [provider]  pregabalin (LYRICA) 25 MG capsule Take 1-2 capsules (25-50 mg total) by mouth at bedtime. 01/21/17   Meredith Staggers, MD  sertraline (ZOLOFT) 50 MG tablet Take 75 mg by mouth every evening.     [provider]  trimethoprim (TRIMPEX) 100 MG tablet Take 100 mg by mouth every evening.     [provider]    Family History Family History  Problem Relation Age of Onset  . Diabetes Mother   . Heart disease Mother   . Heart attack Father   . Breast cancer Sister     Social History Social History   Tobacco Use  . Smoking status: Never Smoker  . Smokeless tobacco: Never Used  Substance Use Topics  . Alcohol use: Yes    Alcohol/week: 0.0 oz    Comment: glass of wine a day  . Drug use: No     Allergies   Patient has no known allergies.   Review of Systems Review of Systems  Gastrointestinal: Positive for diarrhea.  UNABLE TO OBTAIN 2/2 LANGUAGE BARRIER FROM PREVIOUS STROKE  Physical Exam Updated Vital Signs BP 100/69 (BP Location: Right Arm)   Pulse 79   Temp 98 F (36.7 C) (Axillary)   Resp 14   SpO2 100%   Physical Exam  Constitutional: She appears well-developed and well-nourished. No distress.  HENT:  Head: Normocephalic and atraumatic.  dry mucous membranes  Eyes: Conjunctivae are normal. Pupils are equal, round, and reactive to light.  Neck: Neck supple.  Cardiovascular: Normal rate, regular rhythm and normal heart sounds.  No murmur heard. Pulmonary/Chest: Effort normal and breath sounds  normal.  Abdominal: Soft. Bowel sounds are normal. She exhibits no distension. There is tenderness (RLQ, suprapubic abd, LLQ). There is no rebound and no guarding.  Musculoskeletal: She exhibits no edema.  Neurological: She is alert.  Garbled, slurred speech  Skin: Skin is warm and dry.  Nursing note and vitals reviewed.    ED Treatments / Results  Labs (all labs ordered are listed, but only abnormal results are displayed) Labs Reviewed  COMPREHENSIVE METABOLIC PANEL - Abnormal;  Notable for the following components:      Result Value   Sodium 146 (*)    Potassium 2.6 (*)    Glucose, Bld 131 (*)    BUN 30 (*)    Creatinine, Ser 1.23 (*)    Total Bilirubin 1.3 (*)    GFR calc non Af Amer 41 (*)    GFR calc Af Amer 47 (*)    All other components within normal limits  CBC WITH DIFFERENTIAL/PLATELET - Abnormal; Notable for the following components:   WBC 23.1 (*)    RBC 5.15 (*)    Hemoglobin 15.1 (*)    HCT 46.7 (*)    Platelets 420 (*)    Neutro Abs 21.3 (*)    Lymphs Abs 0.6 (*)    Monocytes Absolute 1.1 (*)    All other components within normal limits  URINE CULTURE  URINALYSIS, ROUTINE W REFLEX MICROSCOPIC  I-STAT CG4 LACTIC ACID, ED  I-STAT CG4 LACTIC ACID, ED    EKG  EKG Interpretation None       Radiology No results found.  Procedures Procedures (including critical care time)  Medications Ordered in ED Medications  potassium chloride 10 mEq in 100 mL IVPB (not administered)  lactated ringers bolus 1,000 mL (not administered)  sodium chloride 0.9 % bolus 1,000 mL (1,000 mLs Intravenous New Bag/Given 02/24/17 1503)  fentaNYL (SUBLIMAZE) injection 50 mcg (50 mcg Intravenous Given 02/24/17 1611)     Initial Impression / Assessment and Plan / ED Course  I have reviewed the triage vital signs and the nursing notes.  Pertinent labs & imaging results that were available during my care of the patient were reviewed by me and considered in my medical decision  making (see chart for details).    Pt w/ h/o ureteral stenting 2 weeks ago, with ongoing pain since stenting, 2 episodes of diarrhea today.  Vital signs stable on arrival.  She had tenderness across her lower abdomen without distention or peritonitis.  Obtained above labs as well as CT of abdomen to evaluate for acute process such as diverticulitis.  Lactate normal, WBC 23.1, hemoglobin 15, K 2.6, Cr 1.23.  I have given the patient a second fluid bolus as well as fentanyl for pain and IV potassium.  Her CT scan is pending and I have signed out to the oncoming provider who will follow up on CT imaging.  NOTE: patient's urologist is Dr. Alona Bene at [facility]. Final Clinical Impressions(s) / ED Diagnoses   Final diagnoses:  Diarrhea, unspecified type  Abdominal pain, unspecified abdominal location  Hypokalemia    ED Discharge Orders    None       Rockford Leinen, Wenda Overland, MD 02/24/17 [phone]

## 2017-02-24 NOTE — ED Notes (Signed)
ED TO INPATIENT HANDOFF REPORT  Name/Age/Gender Kristin [name] 76 y.o. female  Code Status    Code Status Orders  (From admission, onward)        Start     Ordered   02/24/17 2009  Full code  Continuous     02/24/17 2012    Code Status History    Date Active Date Inactive Code Status Order ID Comments User Context   10/17/2015 20:04 10/18/2015 20:41 Full Code [id number]  Theodis Blaze, MD Inpatient   10/04/2015 16:10 10/05/2015 14:31 Full Code [id number]  Levin Erp, PA Inpatient   12/17/2013 11:58 12/24/2013 17:07 Full Code [id number]  Sara Chu Inpatient   12/13/2013 09:53 12/17/2013 11:58 Full Code [id number]  Jettie Booze, MD Inpatient   12/09/2013 16:33 12/13/2013 09:53 Full Code [id number]  Rise Mu, PA-C Inpatient    Advance Directive Documentation     Most Recent Value  Type of Advance Directive  Living will, Healthcare Power of [name] [pt has a most form - copy in room]  Pre-existing out of facility DNR order (yellow form or pink MOST form)  No data  "MOST" Form in Place?  No data      Home/SNF/Other Nursing Home  Chief Complaint diarrhea  Level of Care/Admitting Diagnosis ED Disposition    ED Disposition Condition [facility] Area: [place] [[id number]]  Level of Care: Telemetry [5]  Admit to tele based on following criteria: Monitor QTC interval  Diagnosis: Acute kidney injury [facility]) [[id number]]  Admitting Physician: Vianne Bulls [[id number]]  Attending Physician: Vianne Bulls [[id number]]  Estimated length of stay: past midnight tomorrow  Certification:: I certify this patient will need inpatient services for at least 2 midnights  PT Class (Do Not Modify): Inpatient [101]  PT Acc Code (Do Not Modify): Private [1]       Medical History Past Medical History:  Diagnosis Date  . Arthritis    hands  . CAD (coronary artery disease)    a. 2010: stents pRCA and dRCA. b. LHC 12/13/13: Minimal distal LM  dz. LAD 95% ostial w/ L-->R collat. LCx mild dz. RCA 95% mid-dRCA. Patent stents proximal and distal RCA. EF not assessed, Nl by nuclear study.     . Central retinal vein occlusion, right eye   . Chronic kidney disease    hx kidney stone  . Depression   . Diplopia   . Ear pain    right  . Eosinophilic esophagitis   . Essential hypertension   . GERD (gastroesophageal reflux disease)   . H/O: whooping cough   . Hyperlipidemia with target LDL less than 70   . Incontinence    urine  . Stroke [facility]) 2001   reesultant Rt spastic hemplegia and aphasia     Allergies No Known Allergies  IV Location/Drains/Wounds Patient Lines/Drains/Airways Status   Active Line/Drains/Airways    Name:   Placement date:   Placement time:   Site:   Days:   Peripheral IV 02/24/17 Right Forearm   02/24/17    1502    Forearm   less than 1   Incision (Closed) 12/17/13 Chest Other (Comment)   12/17/13    0742     1165   Incision (Closed) 12/17/13 Leg Right   12/17/13    7824     2353          Labs/Imaging Results for orders placed or performed during the hospital encounter  of 02/24/17 (from the past 48 hour(s))  Comprehensive metabolic panel     Status: Abnormal   Collection Time: 02/24/17  2:56 PM  Result Value Ref Range   Sodium 146 (H) 135 - 145 mmol/L   Potassium 2.6 (LL) 3.5 - 5.1 mmol/L    Comment: CRITICAL RESULT CALLED TO, READ BACK BY AND VERIFIED WITH: QUICK,M. RN _0  ON 11.05.18 BY COHEN,K    Chloride 109 101 - 111 mmol/L   CO2 22 22 - 32 mmol/L   Glucose, Bld 131 (H) 65 - 99 mg/dL   BUN 30 (H) 6 - 20 mg/dL   Creatinine, Ser 1.23 (H) 0.44 - 1.00 mg/dL   Calcium 9.1 8.9 - 10.3 mg/dL   Total Protein 7.2 6.5 - 8.1 g/dL   Albumin 3.7 3.5 - 5.0 g/dL   AST 15 15 - 41 U/L   ALT 14 14 - 54 U/L   Alkaline Phosphatase 98 38 - 126 U/L   Total Bilirubin 1.3 (H) 0.3 - 1.2 mg/dL   GFR calc non Af Amer 41 (L) >60 mL/min   GFR calc Af Amer 47 (L) >60 mL/min    Comment: (NOTE) The eGFR has been  calculated using the CKD EPI equation. This calculation has not been validated in all clinical situations. eGFR's persistently <60 mL/min signify possible Chronic Kidney Disease.    Anion gap 15 5 - 15  CBC with Differential     Status: Abnormal   Collection Time: 02/24/17  2:56 PM  Result Value Ref Range   WBC 23.1 (H) 4.0 - 10.5 K/uL   RBC 5.15 (H) 3.87 - 5.11 MIL/uL   Hemoglobin 15.1 (H) 12.0 - 15.0 g/dL   HCT 46.7 (H) 36.0 - 46.0 %   MCV 90.7 78.0 - 100.0 fL   MCH 29.3 26.0 - 34.0 pg   MCHC 32.3 30.0 - 36.0 g/dL   RDW 14.4 11.5 - 15.5 %   Platelets 420 (H) 150 - 400 K/uL   Neutrophils Relative % 92 %   Neutro Abs 21.3 (H) 1.7 - 7.7 K/uL   Lymphocytes Relative 3 %   Lymphs Abs 0.6 (L) 0.7 - 4.0 K/uL   Monocytes Relative 5 %   Monocytes Absolute 1.1 (H) 0.1 - 1.0 K/uL   Eosinophils Relative 0 %   Eosinophils Absolute 0.0 0.0 - 0.7 K/uL   Basophils Relative 0 %   Basophils Absolute 0.0 0.0 - 0.1 K/uL  I-Stat CG4 Lactic Acid, ED     Status: None   Collection Time: 02/24/17  3:09 PM  Result Value Ref Range   Lactic Acid, Venous 1.76 0.5 - 1.9 mmol/L  Urinalysis, Routine w reflex microscopic     Status: Abnormal   Collection Time: 02/24/17  4:21 PM  Result Value Ref Range   Color, Urine AMBER (A) YELLOW    Comment: BIOCHEMICALS MAY BE AFFECTED BY COLOR   APPearance TURBID (A) CLEAR   Specific Gravity, Urine 1.025 1.005 - 1.030   pH 5.0 5.0 - 8.0   Glucose, UA NEGATIVE NEGATIVE mg/dL   Hgb urine dipstick LARGE (A) NEGATIVE   Bilirubin Urine NEGATIVE NEGATIVE   Ketones, ur 20 (A) NEGATIVE mg/dL   Protein, ur >=300 (A) NEGATIVE mg/dL   Nitrite POSITIVE (A) NEGATIVE   Leukocytes, UA LARGE (A) NEGATIVE   RBC / HPF TOO NUMEROUS TO COUNT 0 - 5 RBC/hpf   WBC, UA TOO NUMEROUS TO COUNT 0 - 5 WBC/hpf   Bacteria, UA MANY (A) NONE  SEEN   Squamous Epithelial / LPF 0-5 (A) NONE SEEN   WBC Clumps PRESENT    Mucus PRESENT    Hyaline Casts, UA PRESENT    Ct Abdomen Pelvis W  Contrast  Result Date: 02/24/2017 CLINICAL DATA:  Diarrhea. EXAM: CT ABDOMEN AND PELVIS WITH CONTRAST TECHNIQUE: Multidetector CT imaging of the abdomen and pelvis was performed using the standard protocol following bolus administration of intravenous contrast. CONTRAST:  146m ISOVUE-300 IOPAMIDOL (ISOVUE-300) INJECTION 61% COMPARISON:  07/11/2014 FINDINGS: Lower chest: Subsegmental atelectasis or linear scarring in the lung bases. Hepatobiliary: Small area of low attenuation in the anterior liver, adjacent to the falciform ligament, is in a characteristic location for focal fatty deposition. Gallbladder distended with tiny layering gallstones evident. No intrahepatic or extrahepatic biliary dilation. Pancreas: Diffuse atrophy without ductal dilatation. Spleen: No splenomegaly. No focal mass lesion. Adrenals/Urinary Tract: No adrenal nodule or mass. Lower pole stone identified right kidney with right internal ureteral stent visualized in situ. Proximal loop of the stent is formed just above the UPJ. Wall thickening in the right renal pelvis presumably related to irritation from the stent Left kidney unremarkable. No hydroureteronephrosis in either kidney. The urinary bladder appears normal for the degree of distention. Stomach/Bowel: Stomach is nondistended. No gastric wall thickening. No evidence of outlet obstruction. Duodenum is normally positioned as is the ligament of Treitz. No small bowel wall thickening. No small bowel dilatation. The terminal ileum is normal. The appendix is not visualized, but there is no edema or inflammation in the region of the cecum. Diverticular changes are noted in the left colon. There is diffuse wall thickening in the left colon. Vascular/Lymphatic: There is abdominal aortic atherosclerosis without aneurysm. There is no gastrohepatic or hepatoduodenal ligament lymphadenopathy. No intraperitoneal or retroperitoneal lymphadenopathy. The portal vein is patent. No pelvic sidewall  lymphadenopathy. Reproductive: Uterus surgically absent.  There is no adnexal mass. Other: No intraperitoneal free fluid. Musculoskeletal: Bone windows reveal no worrisome lytic or sclerotic osseous lesions. IMPRESSION: 1. Mild wall thickening left colon. Infectious or inflammatory colitis would be a consideration. 2. Right internal ureteral stent. There is wall thickening in the right renal pelvis, presumably related to irritation from the stent. Stone seen in the right renal pelvis on the prior study now is in the lower pole the right kidney. 3.  Aortic Atherosclerois (ICD10-170.0) 4. Cholelithiasis. Electronically Signed   By: EMisty StanleyM.D.   On: 02/24/2017 19:30    Pending Labs Unresulted Labs (From admission, onward)   Start     Ordered   02/25/17 0500  Comprehensive metabolic panel  Tomorrow morning,   R     02/24/17 2012   02/24/17 2012  Gastrointestinal Panel by PCR , Stool  (Gastrointestinal Panel by PCR, Stool)  Once,   R     02/24/17 2012   02/24/17 2012  C difficile quick scan w PCR reflex  (C Difficile quick screen w PCR reflex panel)  Once, for 48 hours,   R    Comments:  Laxatives (last 72 hours)   None     Question Answer Comment  Is your patient experiencing loose or watery stools (3 or more in 24 hours)? Yes   Has the patient received laxatives in the last 24 hours? No   Has a negative Cdiff test resulted in the last 7 days? No      02/24/17 2012   02/24/17 2008  CBC  (heparin)  Once,   R    Comments:  Baseline for  heparin therapy IF NOT ALREADY DRAWN.  Notify MD if PLT < 100 K.    02/24/17 2012   02/24/17 1548  Urine culture  STAT,   STAT    Question:  Patient immune status  Answer:  Normal   02/24/17 1547      Vitals/Pain Today's Vitals   02/24/17 1430 02/24/17 1745 02/24/17 1800  BP: 100/69  101/64  Pulse: 79 82 81  Resp: 14 (!) 21 (!) 22  Temp: 98 F (36.7 C)    TempSrc: Axillary    SpO2: 100% 100% 100%    Isolation Precautions Enteric precautions  (UV disinfection)  Medications Medications  metroNIDAZOLE (FLAGYL) IVPB 500 mg (not administered)  amLODipine (NORVASC) tablet 2.5 mg (not administered)  aspirin tablet 81 mg (not administered)  atorvastatin (LIPITOR) tablet 20 mg (not administered)  clopidogrel (PLAVIX) tablet 75 mg (not administered)  desmopressin (DDAVP) tablet 0.2 mg (not administered)  diclofenac sodium (VOLTAREN) 1 % transdermal gel 4 g (not administered)  oxybutynin (DITROPAN-XL) 24 hr tablet 10 mg (not administered)  pregabalin (LYRICA) capsule 25-50 mg (not administered)  heparin injection 5,000 Units (not administered)  0.9 % NaCl with KCl 40 mEq / L  infusion (not administered)  acetaminophen (TYLENOL) tablet 650 mg (not administered)    Or  acetaminophen (TYLENOL) suppository 650 mg (not administered)  HYDROcodone-acetaminophen (NORCO/VICODIN) 5-325 MG per tablet 1-2 tablet (not administered)  magnesium sulfate IVPB 2 g 50 mL (not administered)  potassium chloride SA (K-DUR,KLOR-CON) CR tablet 20 mEq (not administered)  metroNIDAZOLE (FLAGYL) IVPB 500 mg (not administered)  cefTRIAXone (ROCEPHIN) 2 g in dextrose 5 % 50 mL IVPB (not administered)  sodium chloride 0.9 % bolus 1,000 mL (0 mLs Intravenous Stopped 02/24/17 1654)  fentaNYL (SUBLIMAZE) injection 50 mcg (50 mcg Intravenous Given 02/24/17 1611)  potassium chloride 10 mEq in 100 mL IVPB (0 mEq Intravenous Stopped 02/24/17 1930)  lactated ringers bolus 1,000 mL (0 mLs Intravenous Stopped 02/24/17 1757)  cefTRIAXone (ROCEPHIN) 2 g in dextrose 5 % 50 mL IVPB (0 g Intravenous Stopped 02/24/17 2005)  iopamidol (ISOVUE-300) 61 % injection (100 mLs  Contrast Given 02/24/17 1842)    Mobility non-ambulatory

## 2017-02-24 NOTE — ED Notes (Signed)
Call report to [place], [place]

## 2017-02-24 NOTE — H&P (Addendum)
History and Physical    Kristin [name] YOV:[id number] DOB: Apr 12, 1941 DOA: 02/24/2017  PCP: Leanna Battles, MD   Patient coming from: [facility]  Chief Complaint: Diarrhea  HPI: Kristin [name] is a 76 y.o. female with medical history significant for history of CVA with right-sided hemiparesis and dysarthria, hypertension, and nephrolithiasis status post placement of right ureteral stent 2 weeks ago, now presenting to the emergency department for evaluation of diarrhea and concern for dehydration.  Patient has been complaining of abdominal pain since the placement of her stent and has been managed with oxycodone initially, and later tramadol.  She had otherwise been in her usual state until the development of watery diarrhea with multiple episodes daily.  She reports constant, moderate, localized pain to the right abdomen since the placement of your ureteral stent.  This abdominal pain is stable and unchanged.  ED Course: Upon arrival to the ED, patient is found to be afebrile, saturating well on room air, and with vitals otherwise stable.  EKG features a sinus rhythm with a QTc interval of 527 ms.  Chemistry panel is notable for a potassium of 2.6 and creatinine of 1.23, up from 0.6 last month.  CBC is notable for leukocytosis to 23,100, mild polycythemia, and thrombocytosis with platelets 120,000.  Lactic acid is normal.  Urinalysis is suggestive of infection and sample was sent for culture.  Liters of IV fluids, fentanyl, and IV Rocephin.  CT of the abdomen and pelvis demonstrates mild wall thickening of the left colon suggestive of an infectious or inflammatory colitis.  Right ureteral stent is also noted with ureteral thickening, likely irritation from the stent.  Patient has remained hemodynamically stable, not in any acute respiratory distress, and will be admitted to the telemetry evaluation and colitis and UTI with hypokalemia and acute kidney injury.  Review of Systems:  All other systems reviewed  and apart from HPI, are negative.  Past Medical History:  Diagnosis Date  . Arthritis    hands  . CAD (coronary artery disease)    a. 2010: stents pRCA and dRCA. b. LHC 12/13/13: Minimal distal LM dz. LAD 95% ostial w/ L-->R collat. LCx mild dz. RCA 95% mid-dRCA. Patent stents proximal and distal RCA. EF not assessed, Nl by nuclear study.     . Central retinal vein occlusion, right eye   . Chronic kidney disease    hx kidney stone  . Depression   . Diplopia   . Ear pain    right  . Eosinophilic esophagitis   . Essential hypertension   . GERD (gastroesophageal reflux disease)   . H/O: whooping cough   . Hyperlipidemia with target LDL less than 70   . Incontinence    urine  . Stroke [facility]) 2001   reesultant Rt spastic hemplegia and aphasia     Past Surgical History:  Procedure Laterality Date  . ABDOMINAL HYSTERECTOMY    . CAROTID STENT    . cataracts    . ESOPHAGOGASTRODUODENOSCOPY    . lasix    . retina injection       reports that  has never smoked. she has never used smokeless tobacco. She reports that she drinks alcohol. She reports that she does not use drugs.  No Known Allergies  Family History  Problem Relation Age of Onset  . Diabetes Mother   . Heart disease Mother   . Heart attack Father   . Breast cancer Sister      Prior to Admission medications  Medication Sig Start Date End Date Taking? Authorizing Provider  amLODipine (NORVASC) 2.5 MG tablet Take 2.5 mg by mouth daily.   Yes [provider]  aspirin 81 MG tablet Take 81 mg by mouth daily.   Yes [provider]  atorvastatin (LIPITOR) 20 MG tablet Take 20 mg daily at 6 PM by mouth.    Yes [provider]  clopidogrel (PLAVIX) 75 MG tablet Take 75 mg by mouth daily.   Yes [provider]  Coenzyme Q10 (COQ-10 PO) Take 1 tablet daily by mouth.   Yes [provider]  desmopressin (DDAVP) 0.2 MG tablet Take 0.2 mg by mouth daily.   Yes [provider]  diclofenac sodium (VOLTAREN) 1 % GEL Apply 4 g topically 4 (four) times daily as needed (pain).    Yes [provider]  ferrous sulfate 325 (65 FE) MG tablet Take 325 mg daily with breakfast by mouth.   Yes [provider]  losartan (COZAAR) 25 MG tablet Take 25 mg by mouth daily.   Yes [provider]  nitroGLYCERIN (NITROSTAT) 0.4 MG SL tablet Place 1 tablet (0.4 mg total) under the tongue every 5 (five) minutes as needed for chest pain. 10/18/15  Yes Florencia Reasons, MD  omeprazole (PRILOSEC) 20 MG capsule Take 20 mg by mouth daily.   Yes [provider]  oxybutynin (DITROPAN-XL) 10 MG 24 hr tablet Take 10 mg by mouth 2 (two) times daily.   Yes [provider]  pregabalin (LYRICA) 25 MG capsule Take 1-2 capsules (25-50 mg total) by mouth at bedtime. 01/21/17  Yes Meredith Staggers, MD  sertraline (ZOLOFT) 50 MG tablet Take 75 mg by mouth every evening.    Yes [provider]  traMADol (ULTRAM) 50 MG tablet Take 50 mg every 8 (eight) hours as needed by mouth for moderate pain or severe pain.   Yes [provider]  trimethoprim (TRIMPEX) 100 MG tablet Take 100 mg by mouth every evening.    Yes [provider]  Wheat Dextrin (BENEFIBER DRINK MIX PO) Take 15 mLs daily by mouth.   Yes [provider]    Physical Exam: Vitals:   02/24/17 1430 02/24/17 1745 02/24/17 1800  BP: 100/69  101/64  Pulse: 79 82 81  Resp: 14 (!) 21 (!) 22  Temp: 98 F (36.7 C)    TempSrc: Axillary    SpO2: 100% 100% 100%      Constitutional: NAD, calm, in apparent discomfort Eyes: PERTLA, lids and conjunctivae normal ENMT: Mucous membranes are moist. Posterior pharynx clear of any exudate or lesions.   Neck: normal, supple, no masses, no thyromegaly Respiratory: clear to auscultation bilaterally, no wheezing, no crackles. Normal respiratory effort.   Cardiovascular: S1 & S2 heard, regular rate and rhythm. No significant JVD. Abdomen: No  distension, soft, tender in right abdomen without rebound pain or guarding. Bowel sounds normal.  Musculoskeletal: no clubbing / cyanosis. No joint deformity upper and lower extremities.   Skin: no significant rashes, lesions, ulcers. Warm, dry, well-perfused. Neurologic: Dysarthria. PERRL, EOMI. Alert.   Psychiatric: Oriented x 3.  Calm, cooperative.     Labs on Admission: I have personally reviewed following labs and imaging studies  CBC: Recent Labs  Lab 02/24/17 1456  WBC 23.1*  NEUTROABS 21.3*  HGB 15.1*  HCT 46.7*  MCV 90.7  PLT 263*   Basic Metabolic Panel: Recent Labs  Lab 02/24/17 1456  NA 146*  K 2.6*  CL 109  CO2 22  GLUCOSE 131*  BUN 30*  CREATININE 1.23*  CALCIUM 9.1   GFR: CrCl cannot be calculated (Unknown ideal weight.). Liver Function Tests: Recent Labs  Lab 02/24/17 1456  AST 15  ALT 14  ALKPHOS 98  BILITOT 1.3*  PROT 7.2  ALBUMIN 3.7   No results for input(s): LIPASE, AMYLASE in the last 168 hours. No results for input(s): AMMONIA in the last 168 hours. Coagulation Profile: No results for input(s): INR, PROTIME in the last 168 hours. Cardiac Enzymes: No results for input(s): CKTOTAL, CKMB, CKMBINDEX, TROPONINI in the last 168 hours. BNP (last 3 results) No results for input(s): PROBNP in the last 8760 hours. HbA1C: No results for input(s): HGBA1C in the last 72 hours. CBG: No results for input(s): GLUCAP in the last 168 hours. Lipid Profile: No results for input(s): CHOL, HDL, LDLCALC, TRIG, CHOLHDL, LDLDIRECT in the last 72 hours. Thyroid Function Tests: No results for input(s): TSH, T4TOTAL, FREET4, T3FREE, THYROIDAB in the last 72 hours. Anemia Panel: No results for input(s): VITAMINB12, FOLATE, FERRITIN, TIBC, IRON, RETICCTPCT in the last 72 hours. Urine analysis:    Component Value Date/Time   COLORURINE AMBER (A) 02/24/2017 1621   APPEARANCEUR TURBID (A) 02/24/2017 1621   LABSPEC 1.025 02/24/2017 1621   PHURINE 5.0  02/24/2017 1621   GLUCOSEU NEGATIVE 02/24/2017 1621   HGBUR LARGE (A) 02/24/2017 1621   BILIRUBINUR NEGATIVE 02/24/2017 1621   KETONESUR 20 (A) 02/24/2017 1621   PROTEINUR >=300 (A) 02/24/2017 1621   UROBILINOGEN 0.2 12/09/2013 1454   NITRITE POSITIVE (A) 02/24/2017 1621   LEUKOCYTESUR LARGE (A) 02/24/2017 1621   Sepsis Labs: @LABRCNTIP (procalcitonin:4,lacticidven:4) )No results found for this or any previous visit (from the past 240 hour(s)).   Radiological Exams on Admission: Ct Abdomen Pelvis W Contrast  Result Date: 02/24/2017 CLINICAL DATA:  Diarrhea. EXAM: CT ABDOMEN AND PELVIS WITH CONTRAST TECHNIQUE: Multidetector CT imaging of the abdomen and pelvis was performed using the standard protocol following bolus administration of intravenous contrast. CONTRAST:  123mL ISOVUE-300 IOPAMIDOL (ISOVUE-300) INJECTION 61% COMPARISON:  07/11/2014 FINDINGS: Lower chest: Subsegmental atelectasis or linear scarring in the lung bases. Hepatobiliary: Small area of low attenuation in the anterior liver, adjacent to the falciform ligament, is in a characteristic location for focal fatty deposition. Gallbladder distended with tiny layering gallstones evident. No intrahepatic or extrahepatic biliary dilation. Pancreas: Diffuse atrophy without ductal dilatation. Spleen: No splenomegaly. No focal mass lesion. Adrenals/Urinary Tract: No adrenal nodule or mass. Lower pole stone identified right kidney with right internal ureteral stent visualized in situ. Proximal loop of the stent is formed just above the UPJ. Wall thickening in the right renal pelvis presumably related to irritation from the stent Left kidney unremarkable. No hydroureteronephrosis in either kidney. The urinary bladder appears normal for the degree of distention. Stomach/Bowel: Stomach is nondistended. No gastric wall thickening. No evidence of outlet obstruction. Duodenum is normally positioned as is the ligament of Treitz. No small bowel wall  thickening. No small bowel dilatation. The terminal ileum is normal. The appendix is not visualized, but there is no edema or inflammation in the region of the cecum. Diverticular changes are noted in the left colon. There is diffuse wall thickening in the left colon. Vascular/Lymphatic: There is abdominal aortic atherosclerosis without aneurysm. There is no gastrohepatic or hepatoduodenal ligament lymphadenopathy. No intraperitoneal or retroperitoneal lymphadenopathy. The portal vein is patent. No pelvic sidewall lymphadenopathy. Reproductive: Uterus surgically absent.  There is no adnexal mass. Other: No intraperitoneal free fluid. Musculoskeletal: Bone  windows reveal no worrisome lytic or sclerotic osseous lesions. IMPRESSION: 1. Mild wall thickening left colon. Infectious or inflammatory colitis would be a consideration. 2. Right internal ureteral stent. There is wall thickening in the right renal pelvis, presumably related to irritation from the stent. Stone seen in the right renal pelvis on the prior study now is in the lower pole the right kidney. 3.  Aortic Atherosclerois (ICD10-170.0) 4. Cholelithiasis. Electronically Signed   By: Misty Stanley M.D.   On: 02/24/2017 19:30    EKG: Independently reviewed. Sinus rhythm, QTc 527 ms.   Assessment/Plan  1. Colitis  - Pt presents with watery diarrhea, noted to have wall-thickening of left colon and marked leukocytosis  - Check GI panel and C diff, maintain enteric precautions pending results  - Continue IVF hydration and electrolyte replacement    2. UTI  - UA is consistent with UTI, there is marked leukocytosis, and there is a right ureteral stent in place; no obstruction on CT  - No prior urine cultures with predominant organism  - Treated with empiric Rocephin in ED, will continue pending cultures    3. Acute kidney injury  - SCr is 1.23 on admission, up from 0.6 in September 2018  - Likely a prerenal azotemia in setting of watery diarrhea;  no hydronephrosis on CT  - Treated in ED with 2 liters NS  - Continue IVF hydration, hold losartan, repeat chemistries in am   4. Hypokalemia  - Serum potassium is 2.6 on admission, likely secondary to GI-losses  - Treated with 20 mEq oral and 60 mEq IV potassium, 1 g IV magnesium  - Continue cardiac monitoring, repeat chem panel in am    5. Hx of CVA  - Has resultant hemiparesis and dysarthria, no new focal deficits  - Continue Plavix, ASA, Lipitor    6. Hypertension  - BP at goal  - Continue Norvasc, hold losartan until renal fxn stabilizes     7. Prolonged QT interval  - QTc is 527 ms on admission, likely secondary to hypokalemia  - Treat hypokalemia as above, avoid offending agents, continue cardiac monitoring    DVT prophylaxis: sq heparin  Code Status: Full  Family Communication: Family updated at bedside Disposition Plan: Admit to telemetry Consults called: None Admission status: Inpatient    Vianne Bulls, MD [facility] Pager [phone]  If 7PM-7AM, please contact night-coverage [email or web address] Password [facility]  02/24/2017, 8:13 PM

## 2017-02-24 NOTE — ED Provider Notes (Signed)
Pt signed out from Dr. Rex Kras pending UA and CT abd/pelvis.  UA is +.  Culture is pending.  She was given IV Rocephin 2 g.  The pt's CT abd/pelvis shows possible colitis, so she was given additional Flagyl.  No diarrhea here to test for c.diff.  The pt's potassium was replaced.  Pt d/w Dr. Myna Hidalgo ([facility]) for admission.   Isla Pence, MD 02/24/17 2005

## 2017-02-24 NOTE — ED Triage Notes (Signed)
Per EMS:  Pt is coming from [facility].  Pt is c/o diarrhea and is worried about dehydration  Pt has had 2 episodes today. Pt has a hx of stroke and right sided paralysis.  Pt's family on the way Approximately 2 weeks ago she had a stent placed for kidney stones.  Pt is afebrile and has had no n/v with EMS.  Last Vitals: 114/70 HR 78 RR 16 97% SPO2 on RA CBG 182

## 2017-02-25 ENCOUNTER — Other Ambulatory Visit: Payer: Self-pay

## 2017-02-25 DIAGNOSIS — I69320 Aphasia following cerebral infarction: Secondary | ICD-10-CM

## 2017-02-25 DIAGNOSIS — E876 Hypokalemia: Secondary | ICD-10-CM

## 2017-02-25 DIAGNOSIS — R109 Unspecified abdominal pain: Secondary | ICD-10-CM

## 2017-02-25 DIAGNOSIS — G8110 Spastic hemiplegia affecting unspecified side: Secondary | ICD-10-CM

## 2017-02-25 DIAGNOSIS — R197 Diarrhea, unspecified: Secondary | ICD-10-CM

## 2017-02-25 LAB — COMPREHENSIVE METABOLIC PANEL
ALT: 11 U/L — ABNORMAL LOW (ref 14–54)
AST: 15 U/L (ref 15–41)
Albumin: 3 g/dL — ABNORMAL LOW (ref 3.5–5.0)
Alkaline Phosphatase: 77 U/L (ref 38–126)
Anion gap: 11 (ref 5–15)
BUN: 17 mg/dL (ref 6–20)
CO2: 23 mmol/L (ref 22–32)
Calcium: 8.2 mg/dL — ABNORMAL LOW (ref 8.9–10.3)
Chloride: 113 mmol/L — ABNORMAL HIGH (ref 101–111)
Creatinine, Ser: 0.74 mg/dL (ref 0.44–1.00)
GFR calc Af Amer: >60 mL/min (ref >60)
GFR calc non Af Amer: >60 mL/min (ref >60)
Glucose, Bld: 138 mg/dL — ABNORMAL HIGH (ref 65–99)
Potassium: 3 mmol/L — ABNORMAL LOW (ref 3.5–5.1)
Sodium: 147 mmol/L — ABNORMAL HIGH (ref 135–145)
Total Bilirubin: 0.9 mg/dL (ref 0.3–1.2)
Total Protein: 6 g/dL — ABNORMAL LOW (ref 6.5–8.1)

## 2017-02-25 LAB — GLUCOSE, CAPILLARY: Glucose-Capillary: 135 mg/dL — ABNORMAL HIGH (ref 65–99)

## 2017-02-25 LAB — MRSA PCR SCREENING: MRSA by PCR: NEGATIVE

## 2017-02-25 MED ORDER — SODIUM CHLORIDE 0.45 % IV SOLN
INTRAVENOUS | Status: DC
  Administered 2017-02-25 – 2017-02-26 (×2): via INTRAVENOUS
  Filled 2017-02-25 (×6): qty 1000

## 2017-02-25 MED ORDER — ENOXAPARIN SODIUM 40 MG/0.4ML ~~LOC~~ SOLN
40.0000 mg | Freq: Every day | SUBCUTANEOUS | Status: DC
  Administered 2017-02-25 – 2017-02-27 (×3): 40 mg via SUBCUTANEOUS
  Filled 2017-02-25 (×3): qty 0.4

## 2017-02-25 MED ORDER — POTASSIUM CHLORIDE CRYS ER 20 MEQ PO TBCR
20.0000 meq | EXTENDED_RELEASE_TABLET | Freq: Two times a day (BID) | ORAL | Status: DC
  Administered 2017-02-25: 20 meq via ORAL
  Filled 2017-02-25: qty 1

## 2017-02-25 MED ORDER — SODIUM CHLORIDE 0.45 % IV SOLN: INTRAVENOUS | Status: DC

## 2017-02-25 NOTE — Progress Notes (Signed)
INTERIM PROGRESS NOTE  Notified by RN of pt's husband's concern of right foot becoming cold. She had been in the recliner and brought back into bed by PT who noted redness in the toes. On my evaluation the patient reports this happens sometimes and resolves on its own, more frequently on her hemiparetic right side. No other issues to report.   The tips of the right and left foot are cold relative to the warmth of the rest of the feet, though the right foot does have some mild purple discoloration beginning at the midfoot which is blanchable. No wounds. DP pulses 1+ and symmetric bilaterally, cap refill brisk. No edema.   I suspect this is a benign finding, possibly reynaud's. If continues, would consider further evaluation with doppler studies, but there is no critical limb ischemia at this time.   Vance Gather, MD 02/25/2017, 6:16 PM

## 2017-02-25 NOTE — Care Management Note (Signed)
Case Management Note  Patient Details  Name: Kristin [name] MRN: [id number] Date of Birth: 1941/01/06  Subjective/Objective: 76 y/o f admitted w/AKI. From home.                   Action/Plan:dc plan home.   Expected Discharge Date:  (unknown)               Expected Discharge Plan:  Home/Self Care  In-House Referral:     Discharge planning Services  CM Consult  Post Acute Care Choice:    Choice offered to:     DME Arranged:    DME Agency:     HH Arranged:    HH Agency:     Status of Service:  In process, will continue to follow  If discussed at Long Length of Stay Meetings, dates discussed:    Additional Comments:  Dessa Phi, RN 02/25/2017, 1:42 PM

## 2017-02-25 NOTE — Progress Notes (Signed)
PROGRESS NOTE  Kristin [name]  NID:[id number] DOB: 1941/04/19 DOA: 02/24/2017 PCP: Leanna Battles, MD  Brief Narrative: with medical history significant for history of CVA with right-sided hemiparesis and dysarthria, hypertension, and nephrolithiasis status post placement of right ureteral stent 2 weeks ago, now presenting to the emergency department for evaluation of diarrhea and concern for dehydration.  Patient has been complaining of abdominal pain since the placement of her stent and has been managed with oxycodone initially, and later tramadol.  She had otherwise been in her usual state until the development of watery diarrhea with multiple episodes daily.  She reports constant, moderate, localized pain to the right abdomen since the placement of your ureteral stent.  This abdominal pain is stable and unchanged.  Upon arrival to the ED, patient is found to be afebrile, saturating well on room air, and with vitals otherwise stable.  EKG features a sinus rhythm with a QTc interval of 527 ms.  Chemistry panel is notable for a potassium of 2.6 and creatinine of 1.23, up from 0.6 last month.  CBC is notable for leukocytosis to 23,100, mild polycythemia, and thrombocytosis with platelets 120,000.  Lactic acid is normal.  Urinalysis is suggestive of infection and sample was sent for culture.  Liters of IV fluids, fentanyl, and IV Rocephin.  CT of the abdomen and pelvis demonstrates mild wall thickening of the left colon suggestive of an infectious or inflammatory colitis.  Right ureteral stent is also noted with ureteral thickening, likely irritation from the stent. She was admitted with continued IV fluids, ceftriaxone, and potassium supplementation.    Assessment & Plan: Principal Problem:   AKI (acute kidney injury) ([place]) Active Problems:   CVA 2001   Spastic hemiplegia affecting dominant side (HCC)   Aphasia as late effect of stroke   Prolonged QT interval   Essential hypertension    Nephrolithiasis   Acute lower UTI   Colitis   Hypokalemia   Acute kidney injury ([place])  Complicated UTI: With significant pyuria and leukocytosis with right ureteral stent in place. - Continue empiric ceftriaxone (no prior urine cultures with predominant organism available)  - Monitor urine culture  Urolithiasis: s/p laser lithotripsy, basket extraction and right double J ureteral stent placement 10/22 by Dr. Amalia Hailey. - Plan to follow up with urology as outpatient.  Possible colitis: With watery diarrhea PTA and diffuse colon wall thickening on CT. Abd exam benign. No further diarrhea since arrival, reassuringly.  - Continue contact precautions for now and send sample for GI pathogen panel, C. diff assay as able.   - Will hold targeted antibiotics for now.  Dehydration: Due to poor per oral intake and diarrhea. Improving with fluids, 2L LR/NS at admission.  - Continue IVF's for next 24 hours - Encourage per oral intake as tolerated  Acute kidney injury: Likely prerenal given history and improvement with IV fluids. Baseline Cr is normal (~0.60), 1.23 on admission and improving.  - No further investigation planned, will continue fluids and monitor BMP in AM.   - Holding ARB for now  Hypernatremia:  - Continue DDAVP (Tx for nocturia as outpatient) - Switch to 1/2 NS - Monitor in AM.  Hypokalemia: Improving with repletion, due to improvement in renal function and continued hypokalemia in setting of prolonged QT, will continue replacement in IVF's, also got 37mEq po this AM.  - KCl in IVF as above.  - Recheck with Mg in AM  History of CVA: With residual deficits (right hemiparesis, aphasia), which are stable.  -  PT consulted - Continue DAPT, statin  Hypertension: Chronic, stable.  - Continue norvasc, holding ARB as above.   Prolonged QT interval: QTc 530msec on admission, resolved with latest QTc on telemetry 430msec. Suspect hypokalemia-related. - Continue telemetry x24 hrs,  ok to DC if no further issues.  DVT prophylaxis: Subcutaneous heparin > will switch to lovenox for once daily dosing given improvement in renal function Code Status: Full Family Communication: Daughter at bedside Disposition Plan: Continue inpatient management. Therapy evaluation ordered. From facility.   Consultants:   None  Procedures:   None  Antimicrobials:  Flagyl 11/5  Ceftriaxone 11/5 >>    Subjective: Feels "better" from admission but unable to say exactly what's better. No significant abdominal pain. No diarrhea since admission. Urine output is dark.   Objective: BP 140/67 (BP Location: Left Arm)   Pulse 77   Temp 98.6 F (37 C) (Oral)   Resp 17   Ht 5\' 7"  (1.702 m)   Wt 74.7 kg (164 lb 10.9 oz)   SpO2 99%   BMI 25.79 kg/m   Gen: Pleasant elderly female in no distress Pulm: Non-labored breathing room air. Clear to auscultation anteriorly and laterally. CV: Regular rate and rhythm. No murmur, rub, or gallop. No JVD, no pedal edema. GI: Abdomen soft, mild suprapubic tenderness, non-distended, with normoactive bowel sounds. No organomegaly or masses felt. Ext: Warm, no deformities Skin: No significant wounds/rashes Neuro: Alert and oriented, expressive aphasia and dysarthria, right hemiparesis UE, LE. Left strength, sensation wnl. Psych: Judgement and insight appear normal. Mood & affect appropriate.   Data Reviewed: I have personally reviewed following labs and imaging studies  CBC: Recent Labs  Lab 02/24/17 1456 02/24/17 2333  WBC 23.1* 18.6*  NEUTROABS 21.3*  --   HGB 15.1* 13.6  HCT 46.7* 41.5  MCV 90.7 90.6  PLT 420* 967   Basic Metabolic Panel: Recent Labs  Lab 02/24/17 1456 02/25/17 0520  NA 146* 147*  K 2.6* 3.0*  CL 109 113*  CO2 22 23  GLUCOSE 131* 138*  BUN 30* 17  CREATININE 1.23* 0.74  CALCIUM 9.1 8.2*   GFR: Estimated Creatinine Clearance: 61.1 mL/min (by C-G formula based on SCr of 0.74 mg/dL). Liver Function  Tests: Recent Labs  Lab 02/24/17 1456 02/25/17 0520  AST 15 15  ALT 14 11*  ALKPHOS 98 77  BILITOT 1.3* 0.9  PROT 7.2 6.0*  ALBUMIN 3.7 3.0*   No results for input(s): LIPASE, AMYLASE in the last 168 hours. No results for input(s): AMMONIA in the last 168 hours. Coagulation Profile: No results for input(s): INR, PROTIME in the last 168 hours. Cardiac Enzymes: No results for input(s): CKTOTAL, CKMB, CKMBINDEX, TROPONINI in the last 168 hours. BNP (last 3 results) No results for input(s): PROBNP in the last 8760 hours. HbA1C: No results for input(s): HGBA1C in the last 72 hours. CBG: Recent Labs  Lab 02/25/17 0811  GLUCAP 135*   Lipid Profile: No results for input(s): CHOL, HDL, LDLCALC, TRIG, CHOLHDL, LDLDIRECT in the last 72 hours. Thyroid Function Tests: No results for input(s): TSH, T4TOTAL, FREET4, T3FREE, THYROIDAB in the last 72 hours. Anemia Panel: No results for input(s): VITAMINB12, FOLATE, FERRITIN, TIBC, IRON, RETICCTPCT in the last 72 hours. Urine analysis:    Component Value Date/Time   COLORURINE AMBER (A) 02/24/2017 1621   APPEARANCEUR TURBID (A) 02/24/2017 1621   LABSPEC 1.025 02/24/2017 1621   PHURINE 5.0 02/24/2017 1621   GLUCOSEU NEGATIVE 02/24/2017 1621   HGBUR LARGE (A) 02/24/2017  [place] 02/24/2017 1621   KETONESUR 20 (A) 02/24/2017 1621   PROTEINUR >=300 (A) 02/24/2017 1621   UROBILINOGEN 0.2 12/09/2013 1454   NITRITE POSITIVE (A) 02/24/2017 1621   LEUKOCYTESUR LARGE (A) 02/24/2017 1621   Recent Results (from the past 240 hour(s))  MRSA PCR Screening     Status: None   Collection Time: 02/25/17  4:50 AM  Result Value Ref Range Status   MRSA by PCR NEGATIVE NEGATIVE Final    Comment:        The GeneXpert MRSA Assay (FDA approved for NASAL specimens only), is one component of a comprehensive MRSA colonization surveillance program. It is not intended to diagnose MRSA infection nor to guide or monitor treatment  for MRSA infections.       Radiology Studies: Ct Abdomen Pelvis W Contrast  Result Date: 02/24/2017 CLINICAL DATA:  Diarrhea. EXAM: CT ABDOMEN AND PELVIS WITH CONTRAST TECHNIQUE: Multidetector CT imaging of the abdomen and pelvis was performed using the standard protocol following bolus administration of intravenous contrast. CONTRAST:  176mL ISOVUE-300 IOPAMIDOL (ISOVUE-300) INJECTION 61% COMPARISON:  07/11/2014 FINDINGS: Lower chest: Subsegmental atelectasis or linear scarring in the lung bases. Hepatobiliary: Small area of low attenuation in the anterior liver, adjacent to the falciform ligament, is in a characteristic location for focal fatty deposition. Gallbladder distended with tiny layering gallstones evident. No intrahepatic or extrahepatic biliary dilation. Pancreas: Diffuse atrophy without ductal dilatation. Spleen: No splenomegaly. No focal mass lesion. Adrenals/Urinary Tract: No adrenal nodule or mass. Lower pole stone identified right kidney with right internal ureteral stent visualized in situ. Proximal loop of the stent is formed just above the UPJ. Wall thickening in the right renal pelvis presumably related to irritation from the stent Left kidney unremarkable. No hydroureteronephrosis in either kidney. The urinary bladder appears normal for the degree of distention. Stomach/Bowel: Stomach is nondistended. No gastric wall thickening. No evidence of outlet obstruction. Duodenum is normally positioned as is the ligament of Treitz. No small bowel wall thickening. No small bowel dilatation. The terminal ileum is normal. The appendix is not visualized, but there is no edema or inflammation in the region of the cecum. Diverticular changes are noted in the left colon. There is diffuse wall thickening in the left colon. Vascular/Lymphatic: There is abdominal aortic atherosclerosis without aneurysm. There is no gastrohepatic or hepatoduodenal ligament lymphadenopathy. No intraperitoneal or  retroperitoneal lymphadenopathy. The portal vein is patent. No pelvic sidewall lymphadenopathy. Reproductive: Uterus surgically absent.  There is no adnexal mass. Other: No intraperitoneal free fluid. Musculoskeletal: Bone windows reveal no worrisome lytic or sclerotic osseous lesions. IMPRESSION: 1. Mild wall thickening left colon. Infectious or inflammatory colitis would be a consideration. 2. Right internal ureteral stent. There is wall thickening in the right renal pelvis, presumably related to irritation from the stent. Stone seen in the right renal pelvis on the prior study now is in the lower pole the right kidney. 3.  Aortic Atherosclerois (ICD10-170.0) 4. Cholelithiasis. Electronically Signed   By: Misty Stanley M.D.   On: 02/24/2017 19:30    Scheduled Meds: . amLODipine  2.5 mg Oral Daily  . aspirin EC  81 mg Oral Daily  . atorvastatin  20 mg Oral q1800  . clopidogrel  75 mg Oral Daily  . desmopressin  0.2 mg Oral QHS  . heparin  5,000 Units Subcutaneous Q8H  . oxybutynin  10 mg Oral BID  . potassium chloride  20 mEq Oral BID  . pregabalin  25-50 mg Oral  QHS   Continuous Infusions: . cefTRIAXone (ROCEPHIN)  IV       LOS: 1 day   Time spent: 25 minutes.  Vance Gather, MD [facility] Pager [phone]  If 7PM-7AM, please contact night-coverage [email or web address] Password [facility] 02/25/2017, 12:47 PM

## 2017-02-25 NOTE — Evaluation (Signed)
Physical Therapy Evaluation Patient Details Name: Kristin [name] MRN: [id number] DOB: 1940/06/04 Today's Date: 02/25/2017   History of Present Illness  76 y.o. female with medical history significant for history of CVA with right-sided hemiparesis and dysarthria, hypertension, and nephrolithiasis status post placement of right ureteral stent 2 weeks ago, now presenting to the emergency department for evaluation of diarrhea and concern for dehydration. Dx of AKI, complicated UTI, dehydration, possible colitis.    Clinical Impression  Pt admitted with above diagnosis. Pt currently with functional limitations due to the deficits listed below (see PT Problem List). Min/mod assist for bed mobility, min A to take several pivotal steps to recliner with LUE on rollator. At baseline pt ambulates in her apartment with rollator independently. Activity tolerance limited by fatigue today. R toes bright red, RN aware and will monitor.  Pt will benefit from skilled PT to increase their independence and safety with mobility to allow discharge to the venue listed below.       Follow Up Recommendations Home health PT    Equipment Recommendations  None recommended by PT    Recommendations for Other Services       Precautions / Restrictions Precautions Precautions: Fall Precaution Comments: pt/husband deny h/o falls in past 1 year Restrictions Weight Bearing Restrictions: No      Mobility  Bed Mobility Overal bed mobility: Needs Assistance Bed Mobility: Rolling;Sidelying to Sit Rolling: Mod assist Sidelying to sit: Min assist       General bed mobility comments: assist to initiate roll to R, min A to raise trunk and pivot hips to EOB  Transfers Overall transfer level: Needs assistance Equipment used: 4-wheeled walker Transfers: Sit to/from [facility] Sit to Stand: Min assist Stand pivot transfers: Min guard       General transfer comment: min A to rise, min/guard to take  several pivotal steps with rollator bed to recliner, pt holds rollator with LUE  Ambulation/Gait             General Gait Details: deferred 2* fatigue with transfer  Stairs            Wheelchair Mobility    Modified Rankin (Stroke Patients Only)       Balance Overall balance assessment: Modified Independent(steady in standing with LUE on rollator)                                           Pertinent Vitals/Pain Pain Assessment: Faces Faces Pain Scale: Hurts little more Pain Location: R toes with light touch Pain Descriptors / Indicators: Grimacing Pain Intervention(s): Limited activity within patient's tolerance;Monitored during session;Repositioned(RN notified R toes are bright red)    Home Living Family/patient expects to be discharged to:: Other (Comment) Living Arrangements: Spouse/significant other Available Help at Discharge: Family;Available 24 hours/day Type of Home: Independent living facility Home Access: Level entry     Home Layout: One level Home Equipment: Transport planner;Wheelchair - [employer] - 4 wheels;Shower seat Additional Comments: info provided by husband, pt has expressive aphasia    Prior Function           Comments: pt bathes/dresses independently, walks with rollator, uses WC for long distances     Hand Dominance        Extremity/Trunk Assessment   Upper Extremity Assessment Upper Extremity Assessment: RUE deficits/detail RUE Deficits / Details: flaccid 2* CVA    Lower Extremity  Assessment Lower Extremity Assessment: RLE deficits/detail RLE Deficits / Details: toes are bright red, RN notified, pt's spouse stated pt's foot sometimes turns red when it's been confined (her foot was resting agaist footplate of bed upon my arrival); no AROM R ankle, knee, trace hip flexion functionally when ambulating    Cervical / Trunk Assessment Cervical / Trunk Assessment: Normal  Communication   Communication:  Expressive difficulties  Cognition Arousal/Alertness: Awake/alert Behavior During Therapy: WFL for tasks assessed/performed Overall Cognitive Status: Difficult to assess                                 General Comments: pt has expressive aphasia 2* CVA 2001, can nod/shake head      General Comments General comments (skin integrity, edema, etc.): R toes are bright red, RN and NT notified, R foot was resting against footplate of bed upon my arrival, spouse states pt's foot sometimes turns red after it's been confined/compressed    Exercises     Assessment/Plan    PT Assessment Patient needs continued PT services  PT Problem List Decreased strength;Decreased activity tolerance;Decreased mobility;Pain       PT Treatment Interventions Gait training;Functional mobility training;Therapeutic activities;Therapeutic exercise;Balance training;Patient/family education    PT Goals (Current goals can be found in the Care Plan section)  Acute Rehab PT Goals Patient Stated Goal: return to walking independently with rollator PT Goal Formulation: With patient/family Time For Goal Achievement: 03/11/17 Potential to Achieve Goals: Good    Frequency Min 3X/week   Barriers to discharge        Co-evaluation               AM-PAC PT "6 Clicks" Daily Activity  Outcome Measure Difficulty turning over in bed (including adjusting bedclothes, sheets and blankets)?: A Lot Difficulty moving from lying on back to sitting on the side of the bed? : Unable Difficulty sitting down on and standing up from a chair with arms (e.g., wheelchair, bedside commode, etc,.)?: Unable Help needed moving to and from a bed to chair (including a wheelchair)?: A Little Help needed walking in hospital room?: Total Help needed climbing 3-5 steps with a railing? : Total 6 Click Score: 9    End of Session Equipment Utilized During Treatment: Gait belt Activity Tolerance: Patient tolerated treatment  well Patient left: in chair;with call bell/phone within reach;with family/visitor present Nurse Communication: Mobility status;Other (comment)(R toes bright red) PT Visit Diagnosis: Other abnormalities of gait and mobility (R26.89);Muscle weakness (generalized) (M62.81);Difficulty in walking, not elsewhere classified (R26.2);Pain Pain - Right/Left: Right Pain - part of body: Ankle and joints of foot    Time: 1357-1430 PT Time Calculation (min) (ACUTE ONLY): 33 min   Charges:   PT Evaluation $PT Eval Moderate Complexity: 1 Mod PT Treatments $Therapeutic Activity: 8-22 mins   PT G Codes:          Philomena Doheny 02/25/2017, 2:44 PM [phone]

## 2017-02-26 LAB — BASIC METABOLIC PANEL
Anion gap: 8 (ref 5–15)
BUN: 10 mg/dL (ref 6–20)
CO2: 24 mmol/L (ref 22–32)
Calcium: 8 mg/dL — ABNORMAL LOW (ref 8.9–10.3)
Chloride: 116 mmol/L — ABNORMAL HIGH (ref 101–111)
Creatinine, Ser: 0.62 mg/dL (ref 0.44–1.00)
GFR calc Af Amer: >60 mL/min (ref >60)
GFR calc non Af Amer: >60 mL/min (ref >60)
Glucose, Bld: 121 mg/dL — ABNORMAL HIGH (ref 65–99)
Potassium: 3.9 mmol/L (ref 3.5–5.1)
Sodium: 148 mmol/L — ABNORMAL HIGH (ref 135–145)

## 2017-02-26 LAB — CBC
HCT: 38.6 % (ref 36.0–46.0)
Hemoglobin: 12.4 g/dL (ref 12.0–15.0)
MCH: 29.7 pg (ref 26.0–34.0)
MCHC: 32.1 g/dL (ref 30.0–36.0)
MCV: 92.6 fL (ref 78.0–100.0)
Platelets: 346 10*3/uL (ref 150–400)
RBC: 4.17 MIL/uL (ref 3.87–5.11)
RDW: 14.9 % (ref 11.5–15.5)
WBC: 12.1 10*3/uL — ABNORMAL HIGH (ref 4.0–10.5)

## 2017-02-26 LAB — MAGNESIUM: Magnesium: 2 mg/dL (ref 1.7–2.4)

## 2017-02-26 LAB — URINE CULTURE
Culture: >=100000 — AB
Special Requests: NORMAL

## 2017-02-26 LAB — GLUCOSE, CAPILLARY: Glucose-Capillary: 87 mg/dL (ref 65–99)

## 2017-02-26 MED ORDER — DEXTROSE 5 % IV SOLN
2.0000 g | Freq: Two times a day (BID) | INTRAVENOUS | Status: DC
  Administered 2017-02-26 – 2017-02-27 (×3): 2 g via INTRAVENOUS
  Filled 2017-02-26 (×4): qty 2

## 2017-02-26 MED ORDER — PANTOPRAZOLE SODIUM 40 MG PO TBEC
40.0000 mg | DELAYED_RELEASE_TABLET | Freq: Every day | ORAL | Status: DC
  Administered 2017-02-26 – 2017-02-27 (×2): 40 mg via ORAL
  Filled 2017-02-26 (×2): qty 1

## 2017-02-26 MED ORDER — SERTRALINE HCL 50 MG PO TABS
75.0000 mg | ORAL_TABLET | Freq: Every day | ORAL | Status: DC
  Administered 2017-02-26: 22:00:00 75 mg via ORAL
  Filled 2017-02-26: qty 1

## 2017-02-26 MED ORDER — DEXTROSE-NACL 5-0.9 % IV SOLN
INTRAVENOUS | Status: DC
  Administered 2017-02-26 – 2017-02-27 (×2): via INTRAVENOUS

## 2017-02-26 NOTE — Care Management Note (Signed)
Case Management Note  Patient Details  Name: Kristin [name] MRN: [id number] Date of Birth: 1941-02-15  Subjective/Objective: 76 y/o f admitted w/AKI. From [facility]. PT recc HHPT.TC [facility] spoke to [name]-confirmed they use [facility] for [facility]-tel#[phone]. Fax#[phone](will fax once ready for d/c).                   Action/Plan:d/c home w/HHPT.   Expected Discharge Date:  (unknown)               Expected Discharge Plan:  [place]  In-House Referral:     Discharge planning Services  CM Consult  Post Acute Care Choice:    Choice offered to:     DME Arranged:    DME Agency:     HH Arranged:  PT([facility] has own contract w/[facility] for HHPT.) HH Agency:     Status of Service:  In process, will continue to follow  If discussed at Long Length of Stay Meetings, dates discussed:    Additional Comments:  Dessa Phi, RN 02/26/2017, 1:43 PM

## 2017-02-26 NOTE — Progress Notes (Signed)
PROGRESS NOTE    Kristin [name]  CWC:[id number] DOB: [birth year] DOA: 02/24/2017 PCP: Leanna Battles, MD    Brief Narrative: 76 year old lady with medical history significant forhistory of CVA with right-sided hemiparesis and dysarthria, hypertension, and nephrolithiasis status post placement of right ureteral stent 2 weeks ago, now presenting to the emergency department for evaluation of diarrhea and concern for dehydration. She was found to have pseudomonas UTI and currently getting treatment with cefepime.  CT of the abdomen and pelvis demonstrates mild wall thickening of the left colon suggestive of an infectious or inflammatory colitis. Right ureteral stent is also noted with ureteral thickening, likely irritation from the stent.      Assessment & Plan:   Principal Problem:   AKI (acute kidney injury) ([place]) Active Problems:   CVA 2001   Spastic hemiplegia affecting dominant side (H[facility])   Aphasia as late effect of stroke   Prolonged QT interval   Essential hypertension   Nephrolithiasis   Acute lower UTI   Colitis   Hypokalemia   Acute kidney injury ([place])   Complicated UTI:  S/P RIGHT URETERAL stent, 2 weeks ago.  Urine culture growing pseudomonas.  On cefepime.  Discussed with Dr Alinda Money regarding taking the stent out, he recommends to complete the treatment for the infection and follow up with urology.    Urolithiasis :  S/p right double J Ureteral stent.    Colitis: Mild.  Improved.    Dehydration:  Resume IV fluids.    Hypernatremia:  Would start her dextrose fluids.    Hypertension;  Well controlled.   Prolonged QT interval;  recheck EKG in am.      DVT prophylaxis:  Code Status: (full code.  Family Communication: (discussed with Dr Carlean Purl.  Disposition Plan: pending PT eval.   Consultants:   Spoke to Dr Alinda Money urology over the phone.    Procedures: none.   Antimicrobials:cefepime from 11/7  Subjective: Abdominal pain.    Objective: Vitals:   02/25/17 2054 02/26/17 0500 02/26/17 0555 02/26/17 1258  BP: 126/60  137/79 99/81  Pulse: 92  82 78  Resp: 18  18   Temp: 97.6 F (36.4 C)  97.8 F (36.6 C) 98.3 F (36.8 C)  TempSrc: Oral  Oral Oral  SpO2: 100%  100% 98%  Weight:  74.5 kg (164 lb 3.9 oz)    Height:        Intake/Output Summary (Last 24 hours) at 02/26/2017 1742 Last data filed at 02/26/2017 1126 Gross per 24 hour  Intake 1280.42 ml  Output -  Net 1280.42 ml   Filed Weights   02/24/17 2132 02/25/17 0450 02/26/17 0500  Weight: 74.8 kg (164 lb 14.5 oz) 74.7 kg (164 lb 10.9 oz) 74.5 kg (164 lb 3.9 oz)    Examination:  General exam: Appears calm and comfortable  Respiratory system: Clear to auscultation. Respiratory effort normal. Cardiovascular system: S1 & S2 heard, RRR. No JVD, murmurs, rubs, gallops or clicks. No pedal edema. Gastrointestinal system: Abdomen is nondistended, soft and nontender. No organomegaly or masses felt. Normal bowel sounds heard. Central nervous system: Alert and oriented. No focal neurological deficits. Extremities: Symmetric 5 x 5 power. Skin: No rashes, lesions or ulcers Psychiatry: Judgement and insight appear normal. Mood & affect appropriate.     Data Reviewed: I have personally reviewed following labs and imaging studies  CBC: Recent Labs  Lab 02/24/17 1456 02/24/17 2333 02/26/17 0521  WBC 23.1* 18.6* 12.1*  NEUTROABS 21.3*  --   --  HGB 15.1* 13.6 12.4  HCT 46.7* 41.5 38.6  MCV 90.7 90.6 92.6  PLT 420* 380 401   Basic Metabolic Panel: Recent Labs  Lab 02/24/17 1456 02/25/17 0520 02/26/17 0521  NA 146* 147* 148*  K 2.6* 3.0* 3.9  CL 109 113* 116*  CO2 22 23 24   GLUCOSE 131* 138* 121*  BUN 30* 17 10  CREATININE 1.23* 0.74 0.62  CALCIUM 9.1 8.2* 8.0*  MG  --   --  2.0   GFR: Estimated Creatinine Clearance: 61.1 mL/min (by C-G formula based on SCr of 0.62 mg/dL). Liver Function Tests: Recent Labs  Lab 02/24/17 1456  02/25/17 0520  AST 15 15  ALT 14 11*  ALKPHOS 98 77  BILITOT 1.3* 0.9  PROT 7.2 6.0*  ALBUMIN 3.7 3.0*   No results for input(s): LIPASE, AMYLASE in the last 168 hours. No results for input(s): AMMONIA in the last 168 hours. Coagulation Profile: No results for input(s): INR, PROTIME in the last 168 hours. Cardiac Enzymes: No results for input(s): CKTOTAL, CKMB, CKMBINDEX, TROPONINI in the last 168 hours. BNP (last 3 results) No results for input(s): PROBNP in the last 8760 hours. HbA1C: No results for input(s): HGBA1C in the last 72 hours. CBG: Recent Labs  Lab 02/25/17 0811 02/26/17 0734  GLUCAP 135* 87   Lipid Profile: No results for input(s): CHOL, HDL, LDLCALC, TRIG, CHOLHDL, LDLDIRECT in the last 72 hours. Thyroid Function Tests: No results for input(s): TSH, T4TOTAL, FREET4, T3FREE, THYROIDAB in the last 72 hours. Anemia Panel: No results for input(s): VITAMINB12, FOLATE, FERRITIN, TIBC, IRON, RETICCTPCT in the last 72 hours. Sepsis Labs: Recent Labs  Lab 02/24/17 1509  LATICACIDVEN 1.76    Recent Results (from the past 240 hour(s))  Urine culture     Status: Abnormal   Collection Time: 02/24/17  4:21 PM  Result Value Ref Range Status   Specimen Description URINE, CATHETERIZED  Final   Special Requests Normal  Final   Culture >=100,000 COLONIES/mL PSEUDOMONAS AERUGINOSA (A)  Final   Report Status 02/26/2017 FINAL  Final   Organism ID, Bacteria PSEUDOMONAS AERUGINOSA (A)  Final      Susceptibility   Pseudomonas aeruginosa - MIC*    CEFTAZIDIME 2 SENSITIVE Sensitive     CIPROFLOXACIN <=0.25 SENSITIVE Sensitive     GENTAMICIN 4 SENSITIVE Sensitive     IMIPENEM 2 SENSITIVE Sensitive     PIP/TAZO <=4 SENSITIVE Sensitive     CEFEPIME 8 SENSITIVE Sensitive     * >=100,000 COLONIES/mL PSEUDOMONAS AERUGINOSA  Culture, blood (routine x 2)     Status: None (Preliminary result)   Collection Time: 02/24/17 11:33 PM  Result Value Ref Range Status   Specimen  Description BLOOD LEFT ANTECUBITAL  Final   Special Requests   Final    BOTTLES DRAWN AEROBIC AND ANAEROBIC Blood Culture adequate volume   Culture   Final    NO GROWTH 1 DAY Performed at [facility], [address] [place], [place] [address]    Report Status PENDING  Incomplete  Culture, blood (routine x 2)     Status: None (Preliminary result)   Collection Time: 02/24/17 11:33 PM  Result Value Ref Range Status   Specimen Description BLOOD LEFT HAND  Final   Special Requests IN PEDIATRIC BOTTLE Blood Culture adequate volume  Final   Culture   Final    NO GROWTH 1 DAY Performed at [facility], [place] [address], [place], [place] [address]  Report Status PENDING  Incomplete  MRSA PCR Screening     Status: None   Collection Time: 02/25/17  4:50 AM  Result Value Ref Range Status   MRSA by PCR NEGATIVE NEGATIVE Final    Comment:        The GeneXpert MRSA Assay (FDA approved for NASAL specimens only), is one component of a comprehensive MRSA colonization surveillance program. It is not intended to diagnose MRSA infection nor to guide or monitor treatment for MRSA infections.          Radiology Studies: Ct Abdomen Pelvis W Contrast  Result Date: 02/24/2017 CLINICAL DATA:  Diarrhea. EXAM: CT ABDOMEN AND PELVIS WITH CONTRAST TECHNIQUE: Multidetector CT imaging of the abdomen and pelvis was performed using the standard protocol following bolus administration of intravenous contrast. CONTRAST:  162mL ISOVUE-300 IOPAMIDOL (ISOVUE-300) INJECTION 61% COMPARISON:  07/11/2014 FINDINGS: Lower chest: Subsegmental atelectasis or linear scarring in the lung bases. Hepatobiliary: Small area of low attenuation in the anterior liver, adjacent to the falciform ligament, is in a characteristic location for focal fatty deposition. Gallbladder distended with tiny layering gallstones evident. No intrahepatic or extrahepatic biliary dilation. Pancreas: Diffuse atrophy without ductal  dilatation. Spleen: No splenomegaly. No focal mass lesion. Adrenals/Urinary Tract: No adrenal nodule or mass. Lower pole stone identified right kidney with right internal ureteral stent visualized in situ. Proximal loop of the stent is formed just above the UPJ. Wall thickening in the right renal pelvis presumably related to irritation from the stent Left kidney unremarkable. No hydroureteronephrosis in either kidney. The urinary bladder appears normal for the degree of distention. Stomach/Bowel: Stomach is nondistended. No gastric wall thickening. No evidence of outlet obstruction. Duodenum is normally positioned as is the ligament of Treitz. No small bowel wall thickening. No small bowel dilatation. The terminal ileum is normal. The appendix is not visualized, but there is no edema or inflammation in the region of the cecum. Diverticular changes are noted in the left colon. There is diffuse wall thickening in the left colon. Vascular/Lymphatic: There is abdominal aortic atherosclerosis without aneurysm. There is no gastrohepatic or hepatoduodenal ligament lymphadenopathy. No intraperitoneal or retroperitoneal lymphadenopathy. The portal vein is patent. No pelvic sidewall lymphadenopathy. Reproductive: Uterus surgically absent.  There is no adnexal mass. Other: No intraperitoneal free fluid. Musculoskeletal: Bone windows reveal no worrisome lytic or sclerotic osseous lesions. IMPRESSION: 1. Mild wall thickening left colon. Infectious or inflammatory colitis would be a consideration. 2. Right internal ureteral stent. There is wall thickening in the right renal pelvis, presumably related to irritation from the stent. Stone seen in the right renal pelvis on the prior study now is in the lower pole the right kidney. 3.  Aortic Atherosclerois (ICD10-170.0) 4. Cholelithiasis. Electronically Signed   By: Misty Stanley M.D.   On: 02/24/2017 19:30        Scheduled Meds: . amLODipine  2.5 mg Oral Daily  . aspirin EC   81 mg Oral Daily  . atorvastatin  20 mg Oral q1800  . clopidogrel  75 mg Oral Daily  . desmopressin  0.2 mg Oral QHS  . enoxaparin (LOVENOX) injection  40 mg Subcutaneous Daily  . oxybutynin  10 mg Oral BID  . pantoprazole  40 mg Oral Daily  . pregabalin  25-50 mg Oral QHS  . sertraline  75 mg Oral QHS   Continuous Infusions: . ceFEPime (MAXIPIME) IV Stopped (02/26/17 1126)  . dextrose 5 % and 0.9% NaCl 75 mL/hr at 02/26/17 1047  . sodium chloride  0.45 % with kcl 100 mL/hr at 02/26/17 0049     LOS: 2 days    Time spent: 35 minutes.     Hosie Poisson, MD [facility] Pager [phone]   If 7PM-7AM, please contact night-coverage [email or web address] Password [email or web address] 02/26/2017, 5:42 PM

## 2017-02-26 NOTE — Progress Notes (Addendum)
Physical Therapy Treatment Patient Details Name: Kristin [name] MRN: [id number] DOB: [birth year] Today's Date: 02/26/2017    History of Present Illness 76 y.o. female with medical history significant for history of CVA with right-sided hemiparesis and dysarthria, hypertension, and nephrolithiasis status post placement of right ureteral stent 2 weeks ago, now presenting to the emergency department for evaluation of diarrhea and concern for dehydration. Dx of AKI, complicated UTI, dehydration, possible colitis.      PT Comments    Patient progressing with mobility and able to walk in hallway this session with shoes and R AFO.  She seems still somewhat deconditioned but progressing towards goals and should be able to go home with spouse assist and HHPT.    Follow Up Recommendations  Home health PT     Equipment Recommendations  None recommended by PT    Recommendations for Other Services       Precautions / Restrictions Precautions Precautions: Fall    Mobility  Bed Mobility Overal bed mobility: Needs Assistance     Sidelying to sit: HOB elevated;Min assist       General bed mobility comments: assist for R UE and scooting  Transfers Overall transfer level: Needs assistance Equipment used: 4-wheeled walker Transfers: Sit to/from Stand Sit to Stand: Mod assist         General transfer comment: pulls up on bed rail and lifting assist to stand with shoes donned and R AFO  Ambulation/Gait Ambulation/Gait assistance: Min assist Ambulation Distance (Feet): 90 Feet Assistive device: 4-wheeled walker(R AFO) Gait Pattern/deviations: Step-to pattern;Decreased stride length;Wide base of support     General Gait Details: holds walker with L hand only, assist for walker management with turning, stopped twice to rest with some SOB, but HR 88, SpO2 92   Stairs            Wheelchair Mobility    Modified Rankin (Stroke Patients Only)       Balance Overall balance  assessment: Needs assistance   Sitting balance-Leahy Scale: Fair       Standing balance-Leahy Scale: Poor Standing balance comment: UE and external support for balance                            Cognition Arousal/Alertness: Awake/alert Behavior During Therapy: WFL for tasks assessed/performed Overall Cognitive Status: Difficult to assess                                 General Comments: pt has expressive aphasia 2* CVA 2001, can nod/shake head      Exercises      General Comments General comments (skin integrity, edema, etc.): purewick leaking in bed and pt soiled with urine, assist for hygiene in standing initially and donned briefs to walk      Pertinent Vitals/Pain Faces Pain Scale: Hurts a little bit Pain Location: R toes with light touch Pain Descriptors / Indicators: Grimacing Pain Intervention(s): Monitored during session    Home Living                      Prior Function            PT Goals (current goals can now be found in the care plan section) Progress towards PT goals: Progressing toward goals    Frequency    Min 3X/week      PT Plan Current plan remains  appropriate    Co-evaluation              AM-PAC PT "6 Clicks" Daily Activity  Outcome Measure  Difficulty turning over in bed (including adjusting bedclothes, sheets and blankets)?: A Lot Difficulty moving from lying on back to sitting on the side of the bed? : Unable Difficulty sitting down on and standing up from a chair with arms (e.g., wheelchair, bedside commode, etc,.)?: Unable Help needed moving to and from a bed to chair (including a wheelchair)?: A Little Help needed walking in hospital room?: A Little Help needed climbing 3-5 steps with a railing? : Total 6 Click Score: 11    End of Session Equipment Utilized During Treatment: Gait belt Activity Tolerance: Patient tolerated treatment well Patient left: in chair;with call bell/phone  within reach   PT Visit Diagnosis: Other abnormalities of gait and mobility (R26.89);Muscle weakness (generalized) (M62.81);Difficulty in walking, not elsewhere classified (R26.2)     Time: 5809-9833 PT Time Calculation (min) (ACUTE ONLY): 29 min  Charges:  $Gait Training: 8-22 mins $Therapeutic Activity: 8-22 mins                    G CodesMagda Kiel, [place] [phone] 02/26/2017    Reginia Naas 02/26/2017, 6:33 PM

## 2017-02-27 LAB — CBC
HCT: 38.8 % (ref 36.0–46.0)
Hemoglobin: 12.5 g/dL (ref 12.0–15.0)
MCH: 29.6 pg (ref 26.0–34.0)
MCHC: 32.2 g/dL (ref 30.0–36.0)
MCV: 91.9 fL (ref 78.0–100.0)
Platelets: 333 10*3/uL (ref 150–400)
RBC: 4.22 MIL/uL (ref 3.87–5.11)
RDW: 14.7 % (ref 11.5–15.5)
WBC: 9.5 10*3/uL (ref 4.0–10.5)

## 2017-02-27 LAB — BASIC METABOLIC PANEL
Anion gap: 8 (ref 5–15)
BUN: 7 mg/dL (ref 6–20)
CO2: 24 mmol/L (ref 22–32)
Calcium: 7.9 mg/dL — ABNORMAL LOW (ref 8.9–10.3)
Chloride: 110 mmol/L (ref 101–111)
Creatinine, Ser: 0.51 mg/dL (ref 0.44–1.00)
GFR calc Af Amer: >60 mL/min (ref >60)
GFR calc non Af Amer: >60 mL/min (ref >60)
Glucose, Bld: 132 mg/dL — ABNORMAL HIGH (ref 65–99)
Potassium: 3.5 mmol/L (ref 3.5–5.1)
Sodium: 142 mmol/L (ref 135–145)

## 2017-02-27 MED ORDER — CIPROFLOXACIN HCL 500 MG PO TABS: 500.0000 mg | ORAL_TABLET | Freq: Two times a day (BID) | ORAL | 0 refills | Status: AC

## 2017-02-27 NOTE — Discharge Summary (Signed)
Physician Discharge Summary  Kristin [name] JSH:[id number] DOB: 06-Nov-1940 DOA: 02/24/2017  PCP: Leanna Battles, MD  Admit date: 02/24/2017 Discharge date: 02/27/2017  Admitted From: [facility].  Disposition:  Independent house  Recommendations for Outpatient Follow-up:  1. Follow up with PCP in 1-2 weeks 2. Please obtain BMP/CBC in one week Please follow up with urology in one week.   Discharge Condition:stable.  CODE STATUS: full code.  Diet recommendation: Heart Healthy  Brief/Interim Summary: 76 year old lady with medical history significant forhistory of CVA with right-sided hemiparesis and dysarthria, hypertension, and nephrolithiasis status post placement of right ureteral stent 2 weeks ago, now presenting to the emergency department for evaluation of diarrhea and concern for dehydration. She was found to have pseudomonas UTI and currently getting treatment with cefepime. CT of the abdomen and pelvis demonstrates mild wall thickening of the left colon suggestive of an infectious or inflammatory colitis. Right ureteral stent is also noted with ureteral thickening, likely irritation from the stent.    Discharge Diagnoses:  Principal Problem:   AKI (acute kidney injury) ([place]) Active Problems:   CVA 2001   Spastic hemiplegia affecting dominant side (HCC)   Aphasia as late effect of stroke   Prolonged QT interval   Essential hypertension   Nephrolithiasis   Acute lower UTI   Colitis   Hypokalemia   Acute kidney injury ([place])  Complicated UTI:  S/P RIGHT URETERAL stent, 2 weeks ago.  Urine culture growing pseudomonas.  On cefepime for 2 days, and discharged on oral ciprofloxacin to complete the course.  Discussed with Dr Alinda Money regarding taking the stent out, he recommends to complete the treatment for the infection and follow up with urology as outpatient.    Urolithiasis :  S/p right double J Ureteral stent.  Follow up with urology as outpatient.     Colitis: Mild.  Improved.    Dehydration:  Resolved with fluids.    Hypernatremia:  Dextrose fluids for 24 hours and her sodium improved. .    Hypertension;  Well controlled.      Discharge Instructions  Discharge Instructions    Diet - low sodium heart healthy   Complete by:  As directed    Discharge instructions   Complete by:  As directed    FOLLOW UP WITH UROLOGY IN ONE WEEK, BEFORE THE ANTIBIOTICS ARE COMPLETED.     Allergies as of 02/27/2017   No Known Allergies     Medication List    STOP taking these medications   trimethoprim 100 MG tablet Commonly known as:  TRIMPEX     TAKE these medications   amLODipine 2.5 MG tablet Commonly known as:  NORVASC Take 2.5 mg by mouth daily.   aspirin 81 MG tablet Take 81 mg by mouth daily.   atorvastatin 20 MG tablet Commonly known as:  LIPITOR Take 20 mg daily at 6 PM by mouth.   BENEFIBER DRINK MIX PO Take 15 mLs daily by mouth.   ciprofloxacin 500 MG tablet Commonly known as:  CIPRO Take 1 tablet (500 mg total) 2 (two) times daily for 7 days by mouth.   clopidogrel 75 MG tablet Commonly known as:  PLAVIX Take 75 mg by mouth daily.   COQ-10 PO Take 1 tablet daily by mouth.   desmopressin 0.2 MG tablet Commonly known as:  DDAVP Take 0.2 mg by mouth daily.   diclofenac sodium 1 % Gel Commonly known as:  VOLTAREN Apply 4 g topically 4 (four) times daily as needed (  pain).   ferrous sulfate 325 (65 FE) MG tablet Take 325 mg daily with breakfast by mouth.   losartan 25 MG tablet Commonly known as:  COZAAR Take 25 mg by mouth daily.   nitroGLYCERIN 0.4 MG SL tablet Commonly known as:  NITROSTAT Place 1 tablet (0.4 mg total) under the tongue every 5 (five) minutes as needed for chest pain.   oxybutynin 10 MG 24 hr tablet Commonly known as:  DITROPAN-XL Take 10 mg by mouth 2 (two) times daily.   pregabalin 25 MG capsule Commonly known as:  LYRICA Take 1-2 capsules (25-50 mg  total) by mouth at bedtime.   PRILOSEC 20 MG capsule Generic drug:  omeprazole Take 20 mg by mouth daily.   sertraline 50 MG tablet Commonly known as:  ZOLOFT Take 75 mg by mouth every evening.   traMADol 50 MG tablet Commonly known as:  ULTRAM Take 50 mg every 8 (eight) hours as needed by mouth for moderate pain or severe pain.      [facility]. Follow up.   Specialty:  Physical Therapy Why:  [facility] Contact information: [address] [place] [address] [phone]          No Known Allergies  Consultations:  NONE.    Procedures/Studies: Ct Abdomen Pelvis W Contrast  Result Date: 02/24/2017 CLINICAL DATA:  Diarrhea. EXAM: CT ABDOMEN AND PELVIS WITH CONTRAST TECHNIQUE: Multidetector CT imaging of the abdomen and pelvis was performed using the standard protocol following bolus administration of intravenous contrast. CONTRAST:  124mL ISOVUE-300 IOPAMIDOL (ISOVUE-300) INJECTION 61% COMPARISON:  07/11/2014 FINDINGS: Lower chest: Subsegmental atelectasis or linear scarring in the lung bases. Hepatobiliary: Small area of low attenuation in the anterior liver, adjacent to the falciform ligament, is in a characteristic location for focal fatty deposition. Gallbladder distended with tiny layering gallstones evident. No intrahepatic or extrahepatic biliary dilation. Pancreas: Diffuse atrophy without ductal dilatation. Spleen: No splenomegaly. No focal mass lesion. Adrenals/Urinary Tract: No adrenal nodule or mass. Lower pole stone identified right kidney with right internal ureteral stent visualized in situ. Proximal loop of the stent is formed just above the UPJ. Wall thickening in the right renal pelvis presumably related to irritation from the stent Left kidney unremarkable. No hydroureteronephrosis in either kidney. The urinary bladder appears normal for the degree of distention. Stomach/Bowel: Stomach is  nondistended. No gastric wall thickening. No evidence of outlet obstruction. Duodenum is normally positioned as is the ligament of Treitz. No small bowel wall thickening. No small bowel dilatation. The terminal ileum is normal. The appendix is not visualized, but there is no edema or inflammation in the region of the cecum. Diverticular changes are noted in the left colon. There is diffuse wall thickening in the left colon. Vascular/Lymphatic: There is abdominal aortic atherosclerosis without aneurysm. There is no gastrohepatic or hepatoduodenal ligament lymphadenopathy. No intraperitoneal or retroperitoneal lymphadenopathy. The portal vein is patent. No pelvic sidewall lymphadenopathy. Reproductive: Uterus surgically absent.  There is no adnexal mass. Other: No intraperitoneal free fluid. Musculoskeletal: Bone windows reveal no worrisome lytic or sclerotic osseous lesions. IMPRESSION: 1. Mild wall thickening left colon. Infectious or inflammatory colitis would be a consideration. 2. Right internal ureteral stent. There is wall thickening in the right renal pelvis, presumably related to irritation from the stent. Stone seen in the right renal pelvis on the prior study now is in the lower pole the right kidney. 3.  Aortic Atherosclerois (ICD10-170.0) 4. Cholelithiasis. Electronically  Signed   By: Misty Stanley M.D.   On: 02/24/2017 19:30       Subjective: NO NEW COMPLAINTS.   Discharge Exam: Vitals:   02/27/17 0700 02/27/17 1410  BP: 126/80 124/65  Pulse: 80 88  Resp: 20   Temp: 98 F (36.7 C) 98.1 F (36.7 C)  SpO2: 99% 94%   Vitals:   02/26/17 1258 02/26/17 2119 02/27/17 0700 02/27/17 1410  BP: 99/81 125/73 126/80 124/65  Pulse: 78 78 80 88  Resp:  20 20   Temp: 98.3 F (36.8 C) 98 F (36.7 C) 98 F (36.7 C) 98.1 F (36.7 C)  TempSrc: Oral Oral Oral Oral  SpO2: 98% 100% 99% 94%  Weight:   79.2 kg (174 lb 8 oz)   Height:        General: Pt is alert, awake, not in acute  distress Cardiovascular: RRR, S1/S2 +, no rubs, no gallops Respiratory: CTA bilaterally, no wheezing, no rhonchi Abdominal: Soft, NT, ND, bowel sounds + Extremities: no edema, no cyanosis    The results of significant diagnostics from this hospitalization (including imaging, microbiology, ancillary and laboratory) are listed below for reference.     Microbiology: Recent Results (from the past 240 hour(s))  Urine culture     Status: Abnormal   Collection Time: 02/24/17  4:21 PM  Result Value Ref Range Status   Specimen Description URINE, CATHETERIZED  Final   Special Requests Normal  Final   Culture >=100,000 COLONIES/mL PSEUDOMONAS AERUGINOSA (A)  Final   Report Status 02/26/2017 FINAL  Final   Organism ID, Bacteria PSEUDOMONAS AERUGINOSA (A)  Final      Susceptibility   Pseudomonas aeruginosa - MIC*    CEFTAZIDIME 2 SENSITIVE Sensitive     CIPROFLOXACIN <=0.25 SENSITIVE Sensitive     GENTAMICIN 4 SENSITIVE Sensitive     IMIPENEM 2 SENSITIVE Sensitive     PIP/TAZO <=4 SENSITIVE Sensitive     CEFEPIME 8 SENSITIVE Sensitive     * >=100,000 COLONIES/mL PSEUDOMONAS AERUGINOSA  Culture, blood (routine x 2)     Status: None (Preliminary result)   Collection Time: 02/24/17 11:33 PM  Result Value Ref Range Status   Specimen Description BLOOD LEFT ANTECUBITAL  Final   Special Requests   Final    BOTTLES DRAWN AEROBIC AND ANAEROBIC Blood Culture adequate volume   Culture   Final    NO GROWTH 2 DAYS Performed at [facility], [address], [place], [place] [address]    Report Status PENDING  Incomplete  Culture, blood (routine x 2)     Status: None (Preliminary result)   Collection Time: 02/24/17 11:33 PM  Result Value Ref Range Status   Specimen Description BLOOD LEFT HAND  Final   Special Requests IN PEDIATRIC BOTTLE Blood Culture adequate volume  Final   Culture   Final    NO GROWTH 2 DAYS Performed at [facility], [place] [address] [place], [place] [address]     Report Status PENDING  Incomplete  MRSA PCR Screening     Status: None   Collection Time: 02/25/17  4:50 AM  Result Value Ref Range Status   MRSA by PCR NEGATIVE NEGATIVE Final    Comment:        The GeneXpert MRSA Assay (FDA approved for NASAL specimens only), is one component of a comprehensive MRSA colonization surveillance program. It is not intended to diagnose MRSA infection nor to guide or monitor treatment for MRSA infections.  Labs: BNP (last 3 results) No results for input(s): BNP in the last 8760 hours. Basic Metabolic Panel: Recent Labs  Lab 02/24/17 1456 02/25/17 0520 02/26/17 0521 02/27/17 0514  NA 146* 147* 148* 142  K 2.6* 3.0* 3.9 3.5  CL 109 113* 116* 110  CO2 22 23 24 24   GLUCOSE 131* 138* 121* 132*  BUN 30* 17 10 7   CREATININE 1.23* 0.74 0.62 0.51  CALCIUM 9.1 8.2* 8.0* 7.9*  MG  --   --  2.0  --    Liver Function Tests: Recent Labs  Lab 02/24/17 1456 02/25/17 0520  AST 15 15  ALT 14 11*  ALKPHOS 98 77  BILITOT 1.3* 0.9  PROT 7.2 6.0*  ALBUMIN 3.7 3.0*   No results for input(s): LIPASE, AMYLASE in the last 168 hours. No results for input(s): AMMONIA in the last 168 hours. CBC: Recent Labs  Lab 02/24/17 1456 02/24/17 2333 02/26/17 0521 02/27/17 0514  WBC 23.1* 18.6* 12.1* 9.5  NEUTROABS 21.3*  --   --   --   HGB 15.1* 13.6 12.4 12.5  HCT 46.7* 41.5 38.6 38.8  MCV 90.7 90.6 92.6 91.9  PLT 420* 380 346 333   Cardiac Enzymes: No results for input(s): CKTOTAL, CKMB, CKMBINDEX, TROPONINI in the last 168 hours. BNP: Invalid input(s): POCBNP CBG: Recent Labs  Lab 02/25/17 0811 02/26/17 0734  GLUCAP 135* 87   D-Dimer No results for input(s): DDIMER in the last 72 hours. Hgb A1c No results for input(s): HGBA1C in the last 72 hours. Lipid Profile No results for input(s): CHOL, HDL, LDLCALC, TRIG, CHOLHDL, LDLDIRECT in the last 72 hours. Thyroid function studies No results for input(s): TSH, T4TOTAL, T3FREE, THYROIDAB in  the last 72 hours.  Invalid input(s): FREET3 Anemia work up No results for input(s): VITAMINB12, FOLATE, FERRITIN, TIBC, IRON, RETICCTPCT in the last 72 hours. Urinalysis    Component Value Date/Time   COLORURINE AMBER (A) 02/24/2017 1621   APPEARANCEUR TURBID (A) 02/24/2017 1621   LABSPEC 1.025 02/24/2017 1621   PHURINE 5.0 02/24/2017 1621   GLUCOSEU NEGATIVE 02/24/2017 1621   HGBUR LARGE (A) 02/24/2017 1621   BILIRUBINUR NEGATIVE 02/24/2017 1621   KETONESUR 20 (A) 02/24/2017 1621   PROTEINUR >=300 (A) 02/24/2017 1621   UROBILINOGEN 0.2 12/09/2013 1454   NITRITE POSITIVE (A) 02/24/2017 1621   LEUKOCYTESUR LARGE (A) 02/24/2017 1621   Sepsis Labs Invalid input(s): PROCALCITONIN,  WBC,  LACTICIDVEN Microbiology Recent Results (from the past 240 hour(s))  Urine culture     Status: Abnormal   Collection Time: 02/24/17  4:21 PM  Result Value Ref Range Status   Specimen Description URINE, CATHETERIZED  Final   Special Requests Normal  Final   Culture >=100,000 COLONIES/mL PSEUDOMONAS AERUGINOSA (A)  Final   Report Status 02/26/2017 FINAL  Final   Organism ID, Bacteria PSEUDOMONAS AERUGINOSA (A)  Final      Susceptibility   Pseudomonas aeruginosa - MIC*    CEFTAZIDIME 2 SENSITIVE Sensitive     CIPROFLOXACIN <=0.25 SENSITIVE Sensitive     GENTAMICIN 4 SENSITIVE Sensitive     IMIPENEM 2 SENSITIVE Sensitive     PIP/TAZO <=4 SENSITIVE Sensitive     CEFEPIME 8 SENSITIVE Sensitive     * >=100,000 COLONIES/mL PSEUDOMONAS AERUGINOSA  Culture, blood (routine x 2)     Status: None (Preliminary result)   Collection Time: 02/24/17 11:33 PM  Result Value Ref Range Status   Specimen Description BLOOD LEFT ANTECUBITAL  Final   Special Requests  Final    BOTTLES DRAWN AEROBIC AND ANAEROBIC Blood Culture adequate volume   Culture   Final    NO GROWTH 2 DAYS Performed at [facility], [place] [address] [place], [place] [address]    Report Status PENDING  Incomplete  Culture, blood  (routine x 2)     Status: None (Preliminary result)   Collection Time: 02/24/17 11:33 PM  Result Value Ref Range Status   Specimen Description BLOOD LEFT HAND  Final   Special Requests IN PEDIATRIC BOTTLE Blood Culture adequate volume  Final   Culture   Final    NO GROWTH 2 DAYS Performed at [facility], [place] [address], [place], [place] [address]    Report Status PENDING  Incomplete  MRSA PCR Screening     Status: None   Collection Time: 02/25/17  4:50 AM  Result Value Ref Range Status   MRSA by PCR NEGATIVE NEGATIVE Final    Comment:        The GeneXpert MRSA Assay (FDA approved for NASAL specimens only), is one component of a comprehensive MRSA colonization surveillance program. It is not intended to diagnose MRSA infection nor to guide or monitor treatment for MRSA infections.      Time coordinating discharge: Over 30 minutes  SIGNED:   Hosie Poisson, MD  [facility] 02/27/2017, 2:15 PM Pager   If 7PM-7AM, please contact night-coverage [email or web address] Password [email or web address]

## 2017-02-27 NOTE — Progress Notes (Signed)
Patient and her family given discharge, follow up, and medication instructions, verbalized understanding, IV removed, family to transport back to Independent living facility

## 2017-02-27 NOTE — Care Management Important Message (Signed)
Important Message  Patient Details  Name: Kristin [name] MRN: [id number] Date of Birth: [birth year]   Medicare Important Message Given:  Yes    Kerin Salen 02/27/2017, 10:11 AMImportant Message  Patient Details  Name: Kristin [name] MRN: [id number] Date of Birth: February 10, 1941   Medicare Important Message Given:  Yes    Kerin Salen 02/27/2017, 10:11 AM

## 2017-02-27 NOTE — Care Management Note (Signed)
Case Management Note  Patient Details  Name: Ronni Osterberg MRN: [id number] Date of Birth: [birth year]  Subjective/Objective: Faxed to [facility] w/confirmation HHPT order,& demographic sheet. D/c plan return back to [facility].                   Action/Plan:d/c home w/HHPT.   Expected Discharge Date:  (unknown)               Expected Discharge Plan:  [facility]  In-House Referral:     Discharge planning Services  CM Consult  Post Acute Care Choice:    Choice offered to:     DME Arranged:    DME Agency:     HH Arranged:  PT([facility] has own contract w/[facility] for HHPT.) HH Agency:     Status of Service:  Completed, signed off  If discussed at [employer], dates discussed:    Additional Comments:  Dessa Phi, RN 02/27/2017, 12:02 PM

## 2017-03-02 LAB — CULTURE, BLOOD (ROUTINE X 2)
Culture: NO GROWTH
Culture: NO GROWTH
Special Requests: ADEQUATE
Special Requests: ADEQUATE

## 2017-05-27 ENCOUNTER — Encounter: Payer: Medicare Other | Attending: Physical Medicine & Rehabilitation | Admitting: Physical Medicine & Rehabilitation

## 2017-05-27 ENCOUNTER — Encounter: Payer: Self-pay | Admitting: Physical Medicine & Rehabilitation

## 2017-05-27 VITALS — BP 124/80 | HR 68

## 2017-05-27 DIAGNOSIS — Z87442 Personal history of urinary calculi: Secondary | ICD-10-CM | POA: Diagnosis not present

## 2017-05-27 DIAGNOSIS — I69320 Aphasia following cerebral infarction: Secondary | ICD-10-CM

## 2017-05-27 DIAGNOSIS — I129 Hypertensive chronic kidney disease with stage 1 through stage 4 chronic kidney disease, or unspecified chronic kidney disease: Secondary | ICD-10-CM | POA: Diagnosis not present

## 2017-05-27 DIAGNOSIS — G4733 Obstructive sleep apnea (adult) (pediatric): Secondary | ICD-10-CM | POA: Diagnosis not present

## 2017-05-27 DIAGNOSIS — N189 Chronic kidney disease, unspecified: Secondary | ICD-10-CM | POA: Diagnosis not present

## 2017-05-27 DIAGNOSIS — Z951 Presence of aortocoronary bypass graft: Secondary | ICD-10-CM | POA: Insufficient documentation

## 2017-05-27 DIAGNOSIS — M7582 Other shoulder lesions, left shoulder: Secondary | ICD-10-CM | POA: Insufficient documentation

## 2017-05-27 DIAGNOSIS — M7581 Other shoulder lesions, right shoulder: Secondary | ICD-10-CM | POA: Insufficient documentation

## 2017-05-27 DIAGNOSIS — M1711 Unilateral primary osteoarthritis, right knee: Secondary | ICD-10-CM

## 2017-05-27 DIAGNOSIS — F329 Major depressive disorder, single episode, unspecified: Secondary | ICD-10-CM | POA: Diagnosis not present

## 2017-05-27 DIAGNOSIS — E785 Hyperlipidemia, unspecified: Secondary | ICD-10-CM | POA: Insufficient documentation

## 2017-05-27 DIAGNOSIS — F419 Anxiety disorder, unspecified: Secondary | ICD-10-CM | POA: Diagnosis not present

## 2017-05-27 DIAGNOSIS — I2510 Atherosclerotic heart disease of native coronary artery without angina pectoris: Secondary | ICD-10-CM | POA: Diagnosis not present

## 2017-05-27 DIAGNOSIS — K5900 Constipation, unspecified: Secondary | ICD-10-CM | POA: Insufficient documentation

## 2017-05-27 DIAGNOSIS — G8110 Spastic hemiplegia affecting unspecified side: Secondary | ICD-10-CM

## 2017-05-27 DIAGNOSIS — G4700 Insomnia, unspecified: Secondary | ICD-10-CM | POA: Diagnosis not present

## 2017-05-27 DIAGNOSIS — R4701 Aphasia: Secondary | ICD-10-CM | POA: Diagnosis present

## 2017-05-27 DIAGNOSIS — R079 Chest pain, unspecified: Secondary | ICD-10-CM | POA: Insufficient documentation

## 2017-05-27 DIAGNOSIS — M792 Neuralgia and neuritis, unspecified: Secondary | ICD-10-CM | POA: Diagnosis not present

## 2017-05-27 DIAGNOSIS — G8111 Spastic hemiplegia affecting right dominant side: Secondary | ICD-10-CM | POA: Insufficient documentation

## 2017-05-27 DIAGNOSIS — Z8673 Personal history of transient ischemic attack (TIA), and cerebral infarction without residual deficits: Secondary | ICD-10-CM | POA: Diagnosis present

## 2017-05-27 DIAGNOSIS — K219 Gastro-esophageal reflux disease without esophagitis: Secondary | ICD-10-CM | POA: Diagnosis not present

## 2017-05-27 DIAGNOSIS — M25511 Pain in right shoulder: Secondary | ICD-10-CM | POA: Insufficient documentation

## 2017-05-27 MED ORDER — GABAPENTIN 100 MG PO CAPS: 100.0000 mg | ORAL_CAPSULE | Freq: Three times a day (TID) | ORAL | 3 refills | Status: DC

## 2017-05-27 NOTE — Progress Notes (Signed)
Subjective:    Patient ID: Kristin [name], female    DOB: 11-14-1940, 77 y.o.   MRN: [id number]  HPI   Kristin [name] is here in follow up of her left CVA.  She was in the hospital in November with diarrhea and became somewhat debilitated.  She did receive a course of therapy at home which helped a bit with strengthening although she still feels a bit weaker than she did prior.  She still is having discomfort from the right arm to the right leg.  I asked her if she felt that her knee was specifically tender but she continues to demonstrate that the pain runs from her arm down to her foot.  We weaned off Lyrica last visit and there really were not many differences in her pain levels.  She has she has been trying to walk more but denies that the increased walking is causing more pain.  Pain Inventory Average Pain 7 Pain Right Now 6 My pain is sharp, tingling and .  In the last 24 hours, has pain interfered with the following? General activity 4 Relation with others 4 Enjoyment of life 7 What TIME of day is your pain at its worst? evening Sleep (in general) Poor  Pain is worse with: unsure Pain improves with: medication Relief from Meds: .  Mobility use a walker ability to climb steps?  no do you drive?  no  Function disabled: date disabled 2001  Neuro/Psych bladder control problems weakness trouble walking  Prior Studies Any changes since last visit?  no  Physicians involved in your care Any changes since last visit?  no   Family History  Problem Relation Age of Onset  . Diabetes Mother   . Heart disease Mother   . Heart attack Father   . Breast cancer Sister    Social History   Socioeconomic History  . Marital status: Married    Spouse name: Not on file  . Number of children: 2  . Years of education: Not on file  . Highest education level: Not on file  Social Needs  . Financial resource strain: Not on file  . Food insecurity - worry: Not on file  . Food insecurity -  inability: Not on file  . Transportation needs - medical: Not on file  . Transportation needs - non-medical: Not on file  Occupational History  . Occupation: retired Education officer, museum  Tobacco Use  . Smoking status: Never Smoker  . Smokeless tobacco: Never Used  Substance and Sexual Activity  . Alcohol use: Yes    Alcohol/week: 0.0 oz    Comment: glass of wine a day  . Drug use: No  . Sexual activity: No  Other Topics Concern  . Not on file  Social History Narrative  . Not on file   Past Surgical History:  Procedure Laterality Date  . ABDOMINAL HYSTERECTOMY    . CAROTID STENT    . cataracts    . COLONOSCOPY N/A 10/05/2015   Procedure: COLONOSCOPY;  Surgeon: Manus Gunning, MD;  Location: [facility];  Service: Gastroenterology;  Laterality: N/A;  . CORONARY ARTERY BYPASS GRAFT N/A 12/17/2013   Procedure: CORONARY ARTERY BYPASS GRAFTING (CABG);  Surgeon: Gaye Pollack, MD;  Location: [facility];  Service: Open Heart Surgery;  Laterality: N/A;  Times 2 using left internal mammary artery and endoscopically harvested right saphenous vein  . ESOPHAGOGASTRODUODENOSCOPY    . ESOPHAGOGASTRODUODENOSCOPY N/A 10/05/2015   Procedure: ESOPHAGOGASTRODUODENOSCOPY (EGD);  Surgeon: Manus Gunning, MD;  Location: [facility];  Service: Gastroenterology;  Laterality: N/A;  . INTRAOPERATIVE TRANSESOPHAGEAL ECHOCARDIOGRAM N/A 12/17/2013   Procedure: INTRAOPERATIVE TRANSESOPHAGEAL ECHOCARDIOGRAM;  Surgeon: Gaye Pollack, MD;  Location: [facility];  Service: Open Heart Surgery;  Laterality: N/A;  . lasix    . LEFT HEART CATHETERIZATION WITH CORONARY ANGIOGRAM N/A 12/13/2013   Procedure: LEFT HEART CATHETERIZATION WITH CORONARY ANGIOGRAM;  Surgeon: Jettie Booze, MD;  Location: [facility];  Service: Cardiovascular;  Laterality: N/A;  . retina injection     Past Medical History:  Diagnosis Date  . Arthritis    hands  . CAD (coronary artery disease)    a. 2010: stents pRCA and dRCA. b. LHC  12/13/13: Minimal distal LM dz. LAD 95% ostial w/ L-->R collat. LCx mild dz. RCA 95% mid-dRCA. Patent stents proximal and distal RCA. EF not assessed, Nl by nuclear study.     . Central retinal vein occlusion, right eye   . Chronic kidney disease    hx kidney stone  . Depression   . Diplopia   . Ear pain    right  . Eosinophilic esophagitis   . Essential hypertension   . GERD (gastroesophageal reflux disease)   . H/O: whooping cough   . Hyperlipidemia with target LDL less than 70   . Incontinence    urine  . Stroke [facility]) 2001   reesultant Rt spastic hemplegia and aphasia    There were no vitals taken for this visit.  Opioid Risk Score:   Fall Risk Score:  `1  Depression screen PHQ 2/9  Depression screen PHQ 2/9 11/11/2014  Decreased Interest 0  Down, Depressed, Hopeless 0  PHQ - 2 Score 0    Review of Systems  Constitutional: Negative.   HENT: Negative.   Eyes: Negative.   Respiratory: Negative.   Cardiovascular: Negative.   Gastrointestinal: Negative.   Endocrine: Negative.   Genitourinary: Negative.   Musculoskeletal: Negative.   Skin: Negative.   Allergic/Immunologic: Negative.   Neurological: Negative.   Hematological: Negative.   Psychiatric/Behavioral: Negative.   All other systems reviewed and are negative.      Objective:   Physical Exam General: Alert , No apparent distress  HEENT:PERRL Neck:Supple without JVD or lymphadenopathy  Heart:Regular rate Chest:Normal effort Abdomen:Soft, non-tender, non-distended, bowel sounds positive.  Extremities:No clubbing, cyanosis, or edema. Pulses are 2+  Skin:intact  Neuro:ongoing aphasia, expressive--occasionally able to convey thoughts through her words.  Uses a lot of yes and noes as well as facial expressions to communicate thoughts. Very alert. Follows simple commands. . Has2-3/5 strength right HF, 3 to 3+ out of 5 KE and trace distally at the ankle..  Musculoskeletal:s She has  1 cm sublux of  the right humerus from the glenoid fossa--only mild pain with movement today.   .  Right knee without crepitus today.  There is minimal tenderness with palpation of the patella and joint lines.  Did not have pain with resisted extension or flexion either Psych:Pt's affect is pleasant  Assessment & Plan:  1. Hx of left MCA infarct with spastic right hemiparesis, expressive greater than receptive aphasia  2. . Primary osteoarthritis right knee 4. Insomnia, anxiety  5. Constipation 6. Right shoulder pain related to chronic subluxation, mild adhesive capsulitis, chronic spasticity  7. OSA.  8. Left rotator cuff tendonitis/bursitis;biciptial tendonitis --generally improved.  9. Right rotator cuff tendonitis/right shoulder sublux 10. ?neuropathic pain right side--- still favor her right sided pain to be neuropathic in nature 11. Chest pain: likley  musculoskeletal   Plan:  1.  Made a referral for outpatient therapy at her facility to address right knee control and gait.  She might do well with a new orthotic or at least adjustments to it to help control the knee.  Also will have OT to work on right upper extremity range of motion and performance of ADLs.  2.  Begin a trial of gabapentin 100 mg titrating up to 3 times daily over 8 days time 3. Continue right shoulder harness for support.  4. Pacing and preservation for LUE. She seems to be doing well with these 5.   No interventions directly for knee today.   6. I will see her back in 63months. 18minutes of face to face patient care time were spent during this visit. All questions were encouraged and answered.

## 2017-05-27 NOTE — Patient Instructions (Addendum)
PLEASE FEEL FREE TO CALL OUR OFFICE WITH ANY PROBLEMS OR QUESTIONS ([phone])   GABAPENTIN :  100MG  AT NIGHT FOR 4 DAYS THEN TWICE DAILY FOR DAYS AND THEN THREE X DAILY.

## 2017-05-29 LAB — COMPREHENSIVE METABOLIC PANEL
Albumin: 3.8 (ref 3.5–5.0)
Calcium: 8.7 (ref 8.7–10.7)

## 2017-05-29 LAB — BASIC METABOLIC PANEL
BUN: 13 (ref 4–21)
CO2: 27 — AB (ref 13–22)
Chloride: 104 (ref 99–108)
Creatinine: 0.7 (ref 0.5–1.1)
Glucose: 99
Potassium: 4 (ref 3.4–5.3)
Sodium: 140 (ref 137–147)

## 2017-05-29 LAB — CBC AND DIFFERENTIAL
HCT: 44 (ref 36–46)
Hemoglobin: 14.7 (ref 12.0–16.0)
Platelets: 215 (ref 150–399)
WBC: 4.8

## 2017-05-29 LAB — LIPID PANEL
Cholesterol: 167 (ref 0–200)
HDL: 43 (ref 35–70)
LDL Cholesterol: 107
Triglycerides: 86 (ref 40–160)

## 2017-05-29 LAB — HEPATIC FUNCTION PANEL
ALT: 7 (ref 7–35)
AST: 14 (ref 13–35)
Alkaline Phosphatase: 72 (ref 25–125)
Bilirubin, Total: 0.9

## 2017-05-29 LAB — HEMOGLOBIN A1C: Hemoglobin A1C: 5.2

## 2017-06-04 ENCOUNTER — Ambulatory Visit: Payer: Medicare Other | Attending: Internal Medicine | Admitting: Physical Therapy

## 2017-06-04 DIAGNOSIS — I69351 Hemiplegia and hemiparesis following cerebral infarction affecting right dominant side: Secondary | ICD-10-CM | POA: Insufficient documentation

## 2017-06-04 DIAGNOSIS — R2689 Other abnormalities of gait and mobility: Secondary | ICD-10-CM | POA: Diagnosis present

## 2017-06-05 ENCOUNTER — Other Ambulatory Visit: Payer: Self-pay

## 2017-06-05 ENCOUNTER — Encounter: Payer: Self-pay | Admitting: Physical Therapy

## 2017-06-05 NOTE — Therapy (Signed)
Kristin [name] [address] [place], [place], [address] Phone: [phone]   Fax:  [phone]  Physical Therapy Evaluation  Patient Details  Name: Kristin [name] MRN: [id number] Date of Birth: [birth year] Referring Provider: Rob Bunting, MD   Encounter Date: 06/04/2017  PT End of Session - 06/05/17 2125    Visit Number  1    Number of Visits  1    Authorization Type  [employer] Medicare    PT Start Time  1225    PT Stop Time  1323    PT Time Calculation (min)  58 min       Past Medical History:  Diagnosis Date  . Arthritis    hands  . CAD (coronary artery disease)    a. 2010: stents pRCA and dRCA. b. LHC 12/13/13: Minimal distal LM dz. LAD 95% ostial w/ L-->R collat. LCx mild dz. RCA 95% mid-dRCA. Patent stents proximal and distal RCA. EF not assessed, Nl by nuclear study.     . Central retinal vein occlusion, right eye   . Chronic kidney disease    hx kidney stone  . Depression   . Diplopia   . Ear pain    right  . Eosinophilic esophagitis   . Essential hypertension   . GERD (gastroesophageal reflux disease)   . H/O: whooping cough   . Hyperlipidemia with target LDL less than 70   . Incontinence    urine  . Stroke [facility]) 2001   reesultant Rt spastic hemplegia and aphasia     Past Surgical History:  Procedure Laterality Date  . ABDOMINAL HYSTERECTOMY    . CAROTID STENT    . cataracts    . COLONOSCOPY N/A 10/05/2015   Procedure: COLONOSCOPY;  Surgeon: Manus Gunning, MD;  Location: [facility];  Service: Gastroenterology;  Laterality: N/A;  . CORONARY ARTERY BYPASS GRAFT N/A 12/17/2013   Procedure: CORONARY ARTERY BYPASS GRAFTING (CABG);  Surgeon: Gaye Pollack, MD;  Location: [facility];  Service: Open Heart Surgery;  Laterality: N/A;  Times 2 using left internal mammary artery and endoscopically harvested right saphenous vein  . ESOPHAGOGASTRODUODENOSCOPY    . ESOPHAGOGASTRODUODENOSCOPY N/A 10/05/2015   Procedure: ESOPHAGOGASTRODUODENOSCOPY (EGD);  Surgeon: Manus Gunning, MD;  Location: [facility];  Service: Gastroenterology;  Laterality: N/A;  . INTRAOPERATIVE TRANSESOPHAGEAL ECHOCARDIOGRAM N/A 12/17/2013   Procedure: INTRAOPERATIVE TRANSESOPHAGEAL ECHOCARDIOGRAM;  Surgeon: Gaye Pollack, MD;  Location: [facility];  Service: Open Heart Surgery;  Laterality: N/A;  . lasix    . LEFT HEART CATHETERIZATION WITH CORONARY ANGIOGRAM N/A 12/13/2013   Procedure: LEFT HEART CATHETERIZATION WITH CORONARY ANGIOGRAM;  Surgeon: Jettie Booze, MD;  Location: [facility] CATH LAB;  Service: Cardiovascular;  Laterality: N/A;  . retina injection      There were no vitals filed for this visit.   Subjective Assessment - 06/05/17 2120    Subjective  Pt presents for power wheelchair eval - currently using seat on Nitro walker to roll from lobby to treatment room    Patient is accompained by:  Family member    Patient Stated Goals  obtain power wheelchair    Currently in Pain?  No/denies         [facility] PT Assessment - 06/05/17 0001      Assessment   Medical Diagnosis  Lt CVA with Rt spastic hemiplegia    Referring Provider  Rob Bunting, MD    Onset Date/Surgical Date  -- 2001       Precautions  Precautions  Fall      Prior Function   Level of Independence  Needs assistance with ADLs;Needs assistance with homemaking;Needs assistance with gait             Objective measurements completed on examination: See above findings.        Power wheelchair eval completed with Felton Clinton, ATP with [facility] - LMN to be completed Group 2 power wheelchair recommended                   Plan - 06/05/17 2127    Clinical Impression Statement  Pt is a 77 yr old lady with Rt spastic hemiplegia due to residual effect of Lt CVA in 2001.  Pt presents for power wheelchair eval using seat of Nitro RW as a wheelchair.  Eval completed with Josh Cadle, ATP with [facility]    History and Personal  Factors relevant to plan of care:  Lt CVA 2001    Clinical Presentation  Evolving    Clinical Decision Making  Moderate    PT Frequency  One time visit    PT Treatment/Interventions  -- wheelchair management    Consulted and Agree with Plan of Care  Patient;Family member/caregiver    Family Member Consulted  son-in-law       Patient will benefit from skilled therapeutic intervention in order to improve the following deficits and impairments:  Abnormal gait, Decreased strength, Decreased balance  Visit Diagnosis: Hemiplegia and hemiparesis following cerebral infarction affecting right dominant side ([place]) - Plan: PT plan of care cert/re-cert  Other abnormalities of gait and mobility - Plan: PT plan of care cert/re-cert     Problem List Patient Active Problem List   Diagnosis Date Noted  . Nephrolithiasis 02/24/2017  . Acute lower UTI 02/24/2017  . Colitis 02/24/2017  . Hypokalemia 02/24/2017  . AKI (acute kidney injury) ([place]) 02/24/2017  . Acute kidney injury ([place]) 02/24/2017  . Acute cystitis with hematuria   . Chest pain syndrome   . Chest pain 10/17/2015  . IDA (iron deficiency anemia) 10/04/2015  . Right leg swelling 08/14/2015  . Right rotator cuff tendinitis 08/14/2015  . Biceps tendonitis on left 03/13/2015  . Injury of left rotator cuff 08/17/2014  . Shoulder subluxation, right 07/06/2014  . S/P CABG x 2 12/17/2013  . Essential hypertension   . Unstable angina ([place]) 12/09/2013  . Atherosclerotic heart disease of native coronary artery with unstable angina pectoris: H/o PCI to RCA; Ostial LAD &dRCA severe stenosis. 12/09/2013  . Dyslipidemia, goal LDL below 70 12/09/2013  . Prolonged QT interval 12/09/2013  . Open wound of scalp, without mention of complication 19/41/7408  . CVA 2001 06/01/2012  . Unilateral primary osteoarthritis, right knee 06/01/2012  . Lumbago 06/01/2012  . Spastic hemiplegia affecting dominant side ([place]) 06/01/2012  . Aphasia as late effect  of stroke 06/01/2012    Alda Lea, PT 06/05/2017, 9:35 PM  [facility] [address] [place], [place], [address] Phone: [phone]   Fax:  [phone]  Name: Kristin [name] MRN: [id number] Date of Birth: 1940/05/12

## 2017-07-21 ENCOUNTER — Ambulatory Visit: Payer: Medicare Other | Admitting: Podiatry

## 2017-07-23 ENCOUNTER — Encounter (INDEPENDENT_AMBULATORY_CARE_PROVIDER_SITE_OTHER): Payer: Medicare Other | Admitting: Ophthalmology

## 2017-07-23 DIAGNOSIS — I10 Essential (primary) hypertension: Secondary | ICD-10-CM

## 2017-07-23 DIAGNOSIS — H43813 Vitreous degeneration, bilateral: Secondary | ICD-10-CM | POA: Diagnosis not present

## 2017-07-23 DIAGNOSIS — H35033 Hypertensive retinopathy, bilateral: Secondary | ICD-10-CM

## 2017-07-23 DIAGNOSIS — H348112 Central retinal vein occlusion, right eye, stable: Secondary | ICD-10-CM | POA: Diagnosis not present

## 2017-07-28 ENCOUNTER — Ambulatory Visit: Payer: Medicare Other | Admitting: Podiatry

## 2017-07-28 ENCOUNTER — Ambulatory Visit (INDEPENDENT_AMBULATORY_CARE_PROVIDER_SITE_OTHER): Payer: Medicare Other

## 2017-07-28 ENCOUNTER — Encounter: Payer: Self-pay | Admitting: Podiatry

## 2017-07-28 DIAGNOSIS — M25572 Pain in left ankle and joints of left foot: Secondary | ICD-10-CM | POA: Diagnosis not present

## 2017-07-28 DIAGNOSIS — M779 Enthesopathy, unspecified: Secondary | ICD-10-CM

## 2017-07-28 DIAGNOSIS — M79674 Pain in right toe(s): Secondary | ICD-10-CM | POA: Diagnosis not present

## 2017-07-28 DIAGNOSIS — M79675 Pain in left toe(s): Secondary | ICD-10-CM

## 2017-07-28 DIAGNOSIS — B351 Tinea unguium: Secondary | ICD-10-CM | POA: Diagnosis not present

## 2017-08-01 NOTE — Progress Notes (Signed)
Subjective: 77 y.o. returns the office today for painful, elongated, thickened toenails which she cannot trim herself. Denies any redness or drainage around the nails.  She also points on the inside aspect of her leg ankle that is been hurting for the last several months.  She denies any recent injury or falls and she denies any significant swelling or redness.  She said no recent treatment for this.  Denies any acute changes since last appointment and no new complaints today. Denies any systemic complaints such as fevers, chills, nausea, vomiting.   PCP: Leanna Battles, MD   Objective: NAD DP/PT pulses palpable, CRT less than 3 seconds Nails hypertrophic, dystrophic, elongated, brittle, discolored 10. There is tenderness overlying the nails 1-5 bilaterally. There is no surrounding erythema or drainage along the nail sites. No open lesions or pre-ulcerative lesions are identified. There is mild discomfort on the medial aspect the ankle this is mostly on the course of the posterior tibial tendon.  Minimal discomfort to the medial malleolus however more is on the tendon.  There is no pain with ankle joint range of motion there is no pain to the talus, lateral malleolus, other areas of the foot. No other areas of tenderness bilateral lower extremities. No overlying edema, erythema, increased warmth. No pain with calf compression, swelling, warmth, erythema. Presents in wheelchair   Assessment: Patient presents with symptomatic onychomycosis; likely tendinitis left ankle  Plan: -Treatment options including alternatives, risks, complications were discussed -Nails sharply debrided 10 without complication/bleeding. -X-rays were obtained and reviewed.  No definitive evidence of acute fracture identified.  I dispensed an ankle brace for her to try as well as ice the area.  There is no improvement in the next 2 weeks recommend her to follow-up for repeat x-rays or sooner if any issues are to  arise. -Discussed daily foot inspection. If there are any changes, to call the office immediately.  -Follow-up in 3 months for routine care or sooner if any problems are to arise. In the meantime, encouraged to call the office with any questions, concerns, changes symptoms.  Celesta Gentile, DPM

## 2017-08-26 ENCOUNTER — Encounter: Payer: Medicare Other | Attending: Physical Medicine & Rehabilitation | Admitting: Physical Medicine & Rehabilitation

## 2017-08-26 ENCOUNTER — Encounter: Payer: Self-pay | Admitting: Physical Medicine & Rehabilitation

## 2017-08-26 VITALS — BP 115/73 | HR 67 | Resp 14

## 2017-08-26 DIAGNOSIS — M25511 Pain in right shoulder: Secondary | ICD-10-CM | POA: Diagnosis not present

## 2017-08-26 DIAGNOSIS — N189 Chronic kidney disease, unspecified: Secondary | ICD-10-CM | POA: Diagnosis not present

## 2017-08-26 DIAGNOSIS — E785 Hyperlipidemia, unspecified: Secondary | ICD-10-CM | POA: Insufficient documentation

## 2017-08-26 DIAGNOSIS — G8111 Spastic hemiplegia affecting right dominant side: Secondary | ICD-10-CM | POA: Diagnosis not present

## 2017-08-26 DIAGNOSIS — K5900 Constipation, unspecified: Secondary | ICD-10-CM | POA: Insufficient documentation

## 2017-08-26 DIAGNOSIS — M7582 Other shoulder lesions, left shoulder: Secondary | ICD-10-CM | POA: Diagnosis not present

## 2017-08-26 DIAGNOSIS — R079 Chest pain, unspecified: Secondary | ICD-10-CM | POA: Insufficient documentation

## 2017-08-26 DIAGNOSIS — M7581 Other shoulder lesions, right shoulder: Secondary | ICD-10-CM | POA: Insufficient documentation

## 2017-08-26 DIAGNOSIS — Z8673 Personal history of transient ischemic attack (TIA), and cerebral infarction without residual deficits: Secondary | ICD-10-CM | POA: Insufficient documentation

## 2017-08-26 DIAGNOSIS — K219 Gastro-esophageal reflux disease without esophagitis: Secondary | ICD-10-CM | POA: Diagnosis not present

## 2017-08-26 DIAGNOSIS — R4701 Aphasia: Secondary | ICD-10-CM | POA: Insufficient documentation

## 2017-08-26 DIAGNOSIS — G8110 Spastic hemiplegia affecting unspecified side: Secondary | ICD-10-CM

## 2017-08-26 DIAGNOSIS — I69320 Aphasia following cerebral infarction: Secondary | ICD-10-CM | POA: Diagnosis not present

## 2017-08-26 DIAGNOSIS — I2510 Atherosclerotic heart disease of native coronary artery without angina pectoris: Secondary | ICD-10-CM | POA: Insufficient documentation

## 2017-08-26 DIAGNOSIS — G4700 Insomnia, unspecified: Secondary | ICD-10-CM | POA: Diagnosis not present

## 2017-08-26 DIAGNOSIS — M792 Neuralgia and neuritis, unspecified: Secondary | ICD-10-CM

## 2017-08-26 DIAGNOSIS — Z87442 Personal history of urinary calculi: Secondary | ICD-10-CM | POA: Insufficient documentation

## 2017-08-26 DIAGNOSIS — I129 Hypertensive chronic kidney disease with stage 1 through stage 4 chronic kidney disease, or unspecified chronic kidney disease: Secondary | ICD-10-CM | POA: Insufficient documentation

## 2017-08-26 DIAGNOSIS — M1711 Unilateral primary osteoarthritis, right knee: Secondary | ICD-10-CM | POA: Diagnosis not present

## 2017-08-26 DIAGNOSIS — Z951 Presence of aortocoronary bypass graft: Secondary | ICD-10-CM | POA: Insufficient documentation

## 2017-08-26 DIAGNOSIS — F329 Major depressive disorder, single episode, unspecified: Secondary | ICD-10-CM | POA: Diagnosis not present

## 2017-08-26 DIAGNOSIS — G4733 Obstructive sleep apnea (adult) (pediatric): Secondary | ICD-10-CM | POA: Insufficient documentation

## 2017-08-26 DIAGNOSIS — F419 Anxiety disorder, unspecified: Secondary | ICD-10-CM | POA: Insufficient documentation

## 2017-08-26 MED ORDER — GABAPENTIN 100 MG PO CAPS: 100.0000 mg | ORAL_CAPSULE | Freq: Three times a day (TID) | ORAL | 6 refills | Status: DC

## 2017-08-26 NOTE — Progress Notes (Signed)
Subjective:    Patient ID: Kristin [name], female    DOB: Jan 07, 1941, 77 y.o.   MRN: [id number]  HPI Kristin [name] is here for follow-up of her spastic right-sided hemiparesis.  Her last visit in February I prescribed physical therapy and began her on trial of gabapentin.  She feels that the gabapentin helped her right sided arm and leg pain quite a bit.  [facility] then [facility]  They worked on her gait as well as knee control there.  She is now doing some exercises on her own at home.  Her daughter states that she developed a "bursitis" in her left ankle and was seen by a podiatrist.  Podiatrist recommended a knee sleeve which she still is wearing.  Her daughter noticed some bruising over the shin and questions whether she may have bumped the leg/ankle against something.  Currently she is having minimal pain at the left ankle or foot.  Continues to use a right-sided AFO for ankle control during transfers and gait.  Pain Inventory Average Pain 4 Pain Right Now 7 My pain is sharp, burning, dull and aching  In the last 24 hours, has pain interfered with the following? General activity 2 Relation with others 2 Enjoyment of life 4 What TIME of day is your pain at its worst? night Sleep (in general) Fair  Pain is worse with: unsure Pain improves with: medication Relief from Meds: 1  Mobility walk with assistance use a walker ability to climb steps?  no do you drive?  no use a wheelchair transfers alone Do you have any goals in this area?  no  Function disabled: date disabled . I need assistance with the following:  meal prep, household duties and shopping  Neuro/Psych bladder control problems trouble walking  Prior Studies Any changes since last visit?  no  Physicians involved in your care Any changes since last visit?  no   Family History  Problem Relation Age of Onset  . Diabetes Mother   . Heart disease Mother    . Heart attack Father   . Breast cancer Sister    Social History   Socioeconomic History  . Marital status: Married    Spouse name: Not on file  . Number of children: 2  . Years of education: Not on file  . Highest education level: Not on file  Occupational History  . Occupation: retired Education officer, museum  Social Needs  . Financial resource strain: Not on file  . Food insecurity:    Worry: Not on file    Inability: Not on file  . Transportation needs:    Medical: Not on file    Non-medical: Not on file  Tobacco Use  . Smoking status: Never Smoker  . Smokeless tobacco: Never Used  Substance and Sexual Activity  . Alcohol use: Yes    Alcohol/week: 0.0 oz    Comment: glass of wine a day  . Drug use: No  . Sexual activity: Never  Lifestyle  . Physical activity:    Days per week: Not on file    Minutes per session: Not on file  . Stress: Not on file  Relationships  . Social connections:    Talks on phone: Not on file    Gets together: Not on file    Attends religious service: Not on file    Active member of club or organization: Not on file    Attends meetings  of clubs or organizations: Not on file    Relationship status: Not on file  Other Topics Concern  . Not on file  Social History Narrative  . Not on file   Past Surgical History:  Procedure Laterality Date  . ABDOMINAL HYSTERECTOMY    . CAROTID STENT    . cataracts    . COLONOSCOPY N/A 10/05/2015   Procedure: COLONOSCOPY;  Surgeon: Manus Gunning, MD;  Location: [facility];  Service: Gastroenterology;  Laterality: N/A;  . CORONARY ARTERY BYPASS GRAFT N/A 12/17/2013   Procedure: CORONARY ARTERY BYPASS GRAFTING (CABG);  Surgeon: Gaye Pollack, MD;  Location: [facility];  Service: Open Heart Surgery;  Laterality: N/A;  Times 2 using left internal mammary artery and endoscopically harvested right saphenous vein  . ESOPHAGOGASTRODUODENOSCOPY    . ESOPHAGOGASTRODUODENOSCOPY N/A 10/05/2015   Procedure:  ESOPHAGOGASTRODUODENOSCOPY (EGD);  Surgeon: Manus Gunning, MD;  Location: [facility];  Service: Gastroenterology;  Laterality: N/A;  . INTRAOPERATIVE TRANSESOPHAGEAL ECHOCARDIOGRAM N/A 12/17/2013   Procedure: INTRAOPERATIVE TRANSESOPHAGEAL ECHOCARDIOGRAM;  Surgeon: Gaye Pollack, MD;  Location: [facility];  Service: Open Heart Surgery;  Laterality: N/A;  . lasix    . LEFT HEART CATHETERIZATION WITH CORONARY ANGIOGRAM N/A 12/13/2013   Procedure: LEFT HEART CATHETERIZATION WITH CORONARY ANGIOGRAM;  Surgeon: Jettie Booze, MD;  Location: [facility] CATH LAB;  Service: Cardiovascular;  Laterality: N/A;  . retina injection     Past Medical History:  Diagnosis Date  . Arthritis    hands  . CAD (coronary artery disease)    a. 2010: stents pRCA and dRCA. b. LHC 12/13/13: Minimal distal LM dz. LAD 95% ostial w/ L-->R collat. LCx mild dz. RCA 95% mid-dRCA. Patent stents proximal and distal RCA. EF not assessed, Nl by nuclear study.     . Central retinal vein occlusion, right eye   . Chronic kidney disease    hx kidney stone  . Depression   . Diplopia   . Ear pain    right  . Eosinophilic esophagitis   . Essential hypertension   . GERD (gastroesophageal reflux disease)   . H/O: whooping cough   . Hyperlipidemia with target LDL less than 70   . Incontinence    urine  . Stroke ([place]) 2001   reesultant Rt spastic hemplegia and aphasia    BP 115/73   Pulse 67   Resp 14   SpO2 92%   Opioid Risk Score:   Fall Risk Score:  `1  Depression screen PHQ 2/9  Depression screen [facility] 2/9 08/26/2017 11/11/2014  Decreased Interest 0 0  Down, Depressed, Hopeless 0 0  PHQ - 2 Score 0 0    Review of Systems  Constitutional: Negative.   HENT: Negative.   Eyes: Negative.   Cardiovascular: Negative.   Gastrointestinal: Negative.   Endocrine: Negative.   Genitourinary: Positive for difficulty urinating.  Musculoskeletal: Positive for arthralgias and gait problem.  Skin: Negative.     Allergic/Immunologic: Negative.   Hematological: Negative.   Psychiatric/Behavioral: Negative.        Objective:   Physical Exam  General: No acute distress HEENT: EOMI, oral membranes moist Cards: reg rate  Chest: normal effort Abdomen: Soft, NT, ND Skin: dry, intact Extremities: no edema Neuro:Patient communicates through short phrases and words as well as head nods.  Primarily expressive aphasia.  Right upper extremity with minimal voluntary movement.  She has 2+ to 3 out of 5 hip flexor and knee extensor strength right lower extremity.  Right ankle  in splint with minimal active movement.  Left upper extremity and left lower extremity grossly 4+ to 5 out of 5.  Musculoskeletal:s She has 1 cm sublux of the right humerus from the glenoid fossa--mild pain with movement today..   Minimal pain with right knee range of motion today, passive or active..  Did not have pain with resisted extension or flexion either Psych:Pt's affect is pleasant  Assessment & Plan:  1. Hx of left MCA infarct with spastic right hemiparesis, expressive greater than receptive aphasia  2. . Primary osteoarthritis right knee 4. Insomnia, anxiety  5. Constipation 6. Right shoulder pain related to chronic subluxation, mild adhesive capsulitis, chronic spasticity  7. OSA.  8. Left rotator cuff tendonitis/bursitis;biciptial tendonitis--generally improved. 9. Right rotator cuff tendonitis/right shoulder sublux 10.  Right sided neuropathic pain   Plan:  1.    Continue with home exercise program as we described.  Focus should be on strengthening of her hip flexors hip extensors as well as her knee flexors and extensors.  When she walks she needs to focus on knee control also. 2. Continue gabapentin 100 mg 3 times daily.  This is working well for her  3. Continue right shoulder harness for support.  4. Pacing and preservation for LUE are ongoing considerations.   5.  No interventions required for  right knee today. 6. I will see her back in6 months. 65minutes of face to face patient care time were spent during this visit. All questions were encouraged and answered.

## 2017-08-26 NOTE — Patient Instructions (Signed)
PLEASE FEEL FREE TO CALL OUR OFFICE WITH ANY PROBLEMS OR QUESTIONS ([phone])      

## 2017-09-01 ENCOUNTER — Ambulatory Visit: Payer: Medicare Other | Admitting: Podiatry

## 2017-09-30 ENCOUNTER — Encounter: Payer: Self-pay | Admitting: Podiatry

## 2017-09-30 ENCOUNTER — Ambulatory Visit: Payer: Medicare Other | Admitting: Podiatry

## 2017-09-30 DIAGNOSIS — M79675 Pain in left toe(s): Secondary | ICD-10-CM

## 2017-09-30 DIAGNOSIS — M79674 Pain in right toe(s): Secondary | ICD-10-CM

## 2017-09-30 DIAGNOSIS — B351 Tinea unguium: Secondary | ICD-10-CM | POA: Diagnosis not present

## 2017-09-30 NOTE — Patient Instructions (Signed)
Posterior Tibialis Tendinosis Rehab Ask your health care provider which exercises are safe for you. Do exercises exactly as told by your health care provider and adjust them as directed. It is normal to feel mild stretching, pulling, tightness, or discomfort as you do these exercises, but you should stop right away if you feel sudden pain or your pain gets worse.Do not begin these exercises until told by your health care provider. Stretching and range of motion exercises These exercises warm up your muscles and joints and improve the movement and flexibility in your ankle and foot. These exercises may also help to relieve pain. Exercise A: Standing wall calf stretch, knee straight  1. Stand with your hands against a wall. 2. Extend your __________ leg behind you, and bend your front knee slightly. Keep both of your heels on the floor. 3. Point the toes of your back foot slightly inward. 4. Keeping your heels on the floor and your back knee straight, shift your weight toward the wall. Do not allow your back to arch. You should feel a gentle stretch in the back of your lower leg (calf). 5. Hold this position for __________ seconds. Repeat __________ times. Complete this stretch __________ times a day. Exercise B: Standing wall calf stretch, knee bent 1. Stand with your hands against a wall. 2. Extend your __________ leg behind you, and bend your front knee slightly. Keep both of your heels on the floor. 3. Point the toes of your back foot slightly inward. 4. Unlock your back knee so it is bent. Keep your heels on the floor. You should feel a gentle stretch deep in your calf. 5. Hold this position for __________ seconds. Repeat __________ times. Complete this exercise __________ times a day. Strengthening exercises These exercises build strength and endurance in your ankle and foot. Endurance is the ability to use your muscles for a long time, even after they get tired. Exercise C: Ankle inversion  with band 1. Secure one end of an exercise band or tubing to a fixed object, such as a table leg or a pole, that will stay still when the band is pulled. to an object that will not move if it is pulled on, like a table leg. 2. Loop the other end of the band around the middle of your left / right foot. 3. Sit on the floor facing the object with your __________ leg extended. The band or tube should be slightly tense when your foot is relaxed. 4. Leading with your big toe, slowly bring your __________ foot and ankle inward, toward your other foot. 5. Hold this position for __________ seconds. 6. Slowly return your foot to the starting position. Repeat __________ times. Complete this exercise __________ times a day. Exercise D: Towel curls  1. Sit in a chair on a non-carpeted surface, and put your feet on the floor. 2. Place a towel in front of your feet. If told by your health care provider, add __________ at the end of the towel. 3. Keeping your heel on the floor, put your __________ foot on the towel. 4. Pull the towel toward you by grabbing the towel with your toes and curling them under. Keep your heel on the floor. 5. Let your toes relax. 6. Grab the towel with your toes again. Keep going until the towel is completely underneath your foot. Repeat __________ times. Complete this exercise __________ times a day. Balance exercises These exercises improve or maintain your balance. Balance is important in preventing falls.   Exercise E: Single leg stand 1. Without shoes, stand near a railing or in a doorway. You can hold on to the railing or door frame as needed for balance. 2. Stand on your __________ foot. Keep your big toe down on the floor and try to keep your arch lifted. If balancing in this position is too easy, try the exercise with your eyes closed or while standing on a pillow. 3. Hold this position for __________ seconds. Repeat __________ times. Complete this exercise __________ times a  day. This information is not intended to replace advice given to you by your health care provider. Make sure you discuss any questions you have with your health care provider. Document Released: 04/08/2005 Document Revised: 12/12/2015 Document Reviewed: 12/23/2014 Elsevier Interactive Patient Education  2018 [employer] Inc[employer]  

## 2017-10-05 NOTE — Progress Notes (Signed)
Subjective: 77 y.o. returns the office today for painful, elongated, thickened toenails which she cannot trim herself. Denies any redness or drainage around the nails.  She states the ankle is doing better.  No recent falls and no increase in swelling they have noticed.  Denies any acute changes since last appointment and no new complaints today. Denies any systemic complaints such as fevers, chills, nausea, vomiting.   PCP: Leanna Battles, MD  Objective: AAO 3, NAD-presents with family member  DP/PT pulses palpable, CRT less than 3 seconds Nails hypertrophic, dystrophic, elongated, brittle, discolored 10. There is tenderness overlying the nails 1-5 bilaterally. There is no surrounding erythema or drainage along the nail sites. There is decreased tenderness palpation along the medial aspect left ankle.  Overall the flexor tendons appear to be intact.  There is no area pinpoint bony tenderness or pain to vibratory sensation. No open lesions or pre-ulcerative lesions are identified. No other areas of tenderness bilateral lower extremities. No overlying edema, erythema, increased warmth. No pain with calf compression, swelling, warmth, erythema.  Assessment: Patient presents with symptomatic onychomycosis; improving ankle pain  Plan: -Treatment options including alternatives, risks, complications were discussed -Nails sharply debrided 10 without complication/bleeding. -Continue supportive shoes also discussed some gentle range of motion exercises she can work on the home to help with the pain as well.  Overall her symptoms are much improved we will continue to monitor. -Discussed daily foot inspection. If there are any changes, to call the office immediately.  -Follow-up in 3 months or sooner if any problems are to arise. In the meantime, encouraged to call the office with any questions, concerns, changes symptoms.  Celesta Gentile, DPM

## 2017-10-27 ENCOUNTER — Ambulatory Visit: Payer: Medicare Other | Admitting: Podiatry

## 2018-01-06 ENCOUNTER — Ambulatory Visit: Payer: Medicare Other | Admitting: Orthotics

## 2018-01-06 ENCOUNTER — Ambulatory Visit: Payer: Medicare Other | Admitting: Podiatry

## 2018-01-06 ENCOUNTER — Encounter: Payer: Self-pay | Admitting: Podiatry

## 2018-01-06 DIAGNOSIS — M79604 Pain in right leg: Secondary | ICD-10-CM

## 2018-01-06 DIAGNOSIS — B351 Tinea unguium: Secondary | ICD-10-CM

## 2018-01-06 DIAGNOSIS — M79674 Pain in right toe(s): Secondary | ICD-10-CM | POA: Diagnosis not present

## 2018-01-06 DIAGNOSIS — M79675 Pain in left toe(s): Secondary | ICD-10-CM

## 2018-01-06 DIAGNOSIS — M79605 Pain in left leg: Principal | ICD-10-CM

## 2018-01-06 DIAGNOSIS — L84 Corns and callosities: Secondary | ICD-10-CM

## 2018-01-06 NOTE — Progress Notes (Signed)
Picked out shoes to go with AFO (hanger) brace:  [id number]

## 2018-01-07 NOTE — Progress Notes (Signed)
Subjective: 77 y.o. returns the office today for painful, elongated, thickened toenails which she cannot trim herself. They deny any redness or drainage around the nails. Denies any acute changes since last appointment and no new complaints today. Denies any systemic complaints such as fevers, chills, nausea, vomiting.   PCP: Leanna Battles, MD  Objective: NAD-presents with family member  DP/PT pulses palpable, CRT less than 3 seconds Nails hypertrophic, dystrophic, elongated, brittle, discolored 10. There is tenderness overlying the nails 1-5 bilaterally. There is no surrounding erythema or drainage along the nail sites. No open lesions or pre-ulcerative lesions are identified. No other areas of tenderness bilateral lower extremities. No overlying edema, erythema, increased warmth. No pain with calf compression, swelling, warmth, erythema.  Assessment: Patient presents with symptomatic onychomycosis; improving ankle pain  Plan: -Treatment options including alternatives, risks, complications were discussed -Nails sharply debrided 10 without complication/bleeding. -Discussed daily foot inspection. If there are any changes, to call the office immediately.  -Follow-up in 3 months or sooner if any problems are to arise. In the meantime, encouraged to call the office with any questions, concerns, changes symptoms.  Celesta Gentile, DPM

## 2018-01-19 ENCOUNTER — Ambulatory Visit: Payer: Medicare Other | Admitting: Orthotics

## 2018-01-19 DIAGNOSIS — L84 Corns and callosities: Secondary | ICD-10-CM

## 2018-01-19 DIAGNOSIS — M79675 Pain in left toe(s): Secondary | ICD-10-CM

## 2018-01-19 DIAGNOSIS — M79604 Pain in right leg: Secondary | ICD-10-CM

## 2018-01-19 DIAGNOSIS — M79674 Pain in right toe(s): Secondary | ICD-10-CM

## 2018-01-19 DIAGNOSIS — M79605 Pain in left leg: Principal | ICD-10-CM

## 2018-01-19 NOTE — Progress Notes (Signed)
redordered shoe size larger.

## 2018-02-09 ENCOUNTER — Ambulatory Visit: Payer: Medicare Other | Admitting: Orthotics

## 2018-02-09 DIAGNOSIS — L84 Corns and callosities: Secondary | ICD-10-CM

## 2018-02-09 DIAGNOSIS — M79604 Pain in right leg: Secondary | ICD-10-CM

## 2018-02-09 DIAGNOSIS — M79605 Pain in left leg: Principal | ICD-10-CM

## 2018-02-09 NOTE — Progress Notes (Signed)
Needs split pair of shoes...ordering size 11xw rt and 8 w left.

## 2018-02-24 ENCOUNTER — Encounter: Payer: Self-pay | Admitting: Physical Medicine & Rehabilitation

## 2018-02-24 ENCOUNTER — Encounter: Payer: Medicare Other | Attending: Physical Medicine & Rehabilitation | Admitting: Physical Medicine & Rehabilitation

## 2018-02-24 VITALS — BP 112/70 | HR 62 | Ht 67.0 in

## 2018-02-24 DIAGNOSIS — I69351 Hemiplegia and hemiparesis following cerebral infarction affecting right dominant side: Secondary | ICD-10-CM | POA: Insufficient documentation

## 2018-02-24 DIAGNOSIS — M7591 Shoulder lesion, unspecified, right shoulder: Secondary | ICD-10-CM | POA: Insufficient documentation

## 2018-02-24 DIAGNOSIS — X58XXXA Exposure to other specified factors, initial encounter: Secondary | ICD-10-CM | POA: Insufficient documentation

## 2018-02-24 DIAGNOSIS — Z8249 Family history of ischemic heart disease and other diseases of the circulatory system: Secondary | ICD-10-CM | POA: Diagnosis not present

## 2018-02-24 DIAGNOSIS — N189 Chronic kidney disease, unspecified: Secondary | ICD-10-CM | POA: Diagnosis not present

## 2018-02-24 DIAGNOSIS — G8110 Spastic hemiplegia affecting unspecified side: Secondary | ICD-10-CM | POA: Diagnosis not present

## 2018-02-24 DIAGNOSIS — K5900 Constipation, unspecified: Secondary | ICD-10-CM | POA: Insufficient documentation

## 2018-02-24 DIAGNOSIS — Y838 Other surgical procedures as the cause of abnormal reaction of the patient, or of later complication, without mention of misadventure at the time of the procedure: Secondary | ICD-10-CM | POA: Insufficient documentation

## 2018-02-24 DIAGNOSIS — G629 Polyneuropathy, unspecified: Secondary | ICD-10-CM | POA: Insufficient documentation

## 2018-02-24 DIAGNOSIS — G4700 Insomnia, unspecified: Secondary | ICD-10-CM | POA: Insufficient documentation

## 2018-02-24 DIAGNOSIS — I2510 Atherosclerotic heart disease of native coronary artery without angina pectoris: Secondary | ICD-10-CM | POA: Insufficient documentation

## 2018-02-24 DIAGNOSIS — T8189XA Other complications of procedures, not elsewhere classified, initial encounter: Secondary | ICD-10-CM | POA: Insufficient documentation

## 2018-02-24 DIAGNOSIS — Z87442 Personal history of urinary calculi: Secondary | ICD-10-CM | POA: Insufficient documentation

## 2018-02-24 DIAGNOSIS — F329 Major depressive disorder, single episode, unspecified: Secondary | ICD-10-CM | POA: Insufficient documentation

## 2018-02-24 DIAGNOSIS — S91302A Unspecified open wound, left foot, initial encounter: Secondary | ICD-10-CM

## 2018-02-24 DIAGNOSIS — Z79899 Other long term (current) drug therapy: Secondary | ICD-10-CM | POA: Diagnosis not present

## 2018-02-24 DIAGNOSIS — S43001A Unspecified subluxation of right shoulder joint, initial encounter: Secondary | ICD-10-CM | POA: Insufficient documentation

## 2018-02-24 DIAGNOSIS — I69320 Aphasia following cerebral infarction: Secondary | ICD-10-CM | POA: Diagnosis not present

## 2018-02-24 DIAGNOSIS — E785 Hyperlipidemia, unspecified: Secondary | ICD-10-CM | POA: Insufficient documentation

## 2018-02-24 DIAGNOSIS — M7592 Shoulder lesion, unspecified, left shoulder: Secondary | ICD-10-CM | POA: Diagnosis not present

## 2018-02-24 DIAGNOSIS — K219 Gastro-esophageal reflux disease without esophagitis: Secondary | ICD-10-CM | POA: Insufficient documentation

## 2018-02-24 DIAGNOSIS — M1711 Unilateral primary osteoarthritis, right knee: Secondary | ICD-10-CM | POA: Diagnosis not present

## 2018-02-24 DIAGNOSIS — M792 Neuralgia and neuritis, unspecified: Secondary | ICD-10-CM | POA: Diagnosis not present

## 2018-02-24 DIAGNOSIS — Z951 Presence of aortocoronary bypass graft: Secondary | ICD-10-CM | POA: Diagnosis not present

## 2018-02-24 DIAGNOSIS — G4733 Obstructive sleep apnea (adult) (pediatric): Secondary | ICD-10-CM | POA: Diagnosis not present

## 2018-02-24 DIAGNOSIS — I129 Hypertensive chronic kidney disease with stage 1 through stage 4 chronic kidney disease, or unspecified chronic kidney disease: Secondary | ICD-10-CM | POA: Insufficient documentation

## 2018-02-24 MED ORDER — GABAPENTIN 100 MG PO CAPS: 100.0000 mg | ORAL_CAPSULE | Freq: Three times a day (TID) | ORAL | 3 refills | Status: DC

## 2018-02-24 MED ORDER — COLLAGENASE 250 UNIT/GM EX OINT: 1.0000 "application " | TOPICAL_OINTMENT | Freq: Every day | CUTANEOUS | 2 refills | Status: DC

## 2018-02-24 NOTE — Patient Instructions (Addendum)
PLEASE FEEL FREE TO CALL OUR OFFICE WITH ANY PROBLEMS OR QUESTIONS ([phone])   APPLY SANTYL WITH SLIGHTLY MOISTENED GAUZE TO LEFT ANKLE WOUND DAILY.   GENTLY SCRUB AREA PRIOR TO APPLICATION TO REMOVE ANY EXCESS DEBRIS.

## 2018-02-24 NOTE — Progress Notes (Signed)
Subjective:    Patient ID: Kristin [name], female    DOB: 25-Feb-1941, 77 y.o.   MRN: [id number]  HPI   Patient is here in follow-up of her chronic right hemiparesis and deficits related to her left CVA.  Things have been fairly stable from a standpoint of her right shoulder and knee.  Pain is fairly manageable.  Right shoulder is limited due to subluxation and weakness.  She has had a nonhealing wound of her left ankle after a tumor was excised there months ago.  They are not applying any focal dressing to the wound other than some Vaseline occasionally.  She remains on gabapentin 100 mg 3 times daily for nerve pain.  She does exercise programs at the facility which she stays at.  Appetite has been good.  Bowel and bladder function have been regular.  Pain Inventory Average Pain 4 Pain Right Now 3 My pain is sharp, burning, stabbing and tingling  In the last 24 hours, has pain interfered with the following? General activity 5 Relation with others 5 Enjoyment of life 5 What TIME of day is your pain at its worst? daytime Sleep (in general) Fair  Pain is worse with: bending, sitting and standing Pain improves with: rest and medication Relief from Meds: .  Mobility use a walker ability to climb steps?  no do you drive?  no use a wheelchair  Function not employed: date last employed . disabled: date disabled . retired  Neuro/Psych bladder control problems weakness trouble walking  Prior Studies Any changes since last visit?  no  Physicians involved in your care Any changes since last visit?  no   Family History  Problem Relation Age of Onset  . Diabetes Mother   . Heart disease Mother   . Heart attack Father   . Breast cancer Sister    Social History   Socioeconomic History  . Marital status: Married    Spouse name: Not on file  . Number of children: 2  . Years of education: Not on file  . Highest education level: Not on file  Occupational History  .  Occupation: retired Education officer, [employer]  Social Needs  . Financial resource strain: Not on file  . Food insecurity:    Worry: Not on file    Inability: Not on file  . Transportation needs:    Medical: Not on file    Non-medical: Not on file  Tobacco Use  . Smoking status: Never Smoker  . Smokeless tobacco: Never Used  Substance and Sexual Activity  . Alcohol use: Yes    Alcohol/week: 0.0 standard drinks    Comment: glass of wine a day  . Drug use: No  . Sexual activity: Never  Lifestyle  . Physical activity:    Days per week: Not on file    Minutes per session: Not on file  . Stress: Not on file  Relationships  . Social connections:    Talks on phone: Not on file    Gets together: Not on file    Attends religious service: Not on file    Active member of club or organization: Not on file    Attends meetings of clubs or organizations: Not on file    Relationship status: Not on file  Other Topics Concern  . Not on file  Social History Narrative  . Not on file   Past Surgical History:  Procedure Laterality Date  . ABDOMINAL HYSTERECTOMY    . CAROTID STENT    .  cataracts    . COLONOSCOPY N/A 10/05/2015   Procedure: COLONOSCOPY;  Surgeon: Manus Gunning, MD;  Location: [facility];  Service: Gastroenterology;  Laterality: N/A;  . CORONARY ARTERY BYPASS GRAFT N/A 12/17/2013   Procedure: CORONARY ARTERY BYPASS GRAFTING (CABG);  Surgeon: Gaye Pollack, MD;  Location: [facility];  Service: Open Heart Surgery;  Laterality: N/A;  Times 2 using left internal mammary artery and endoscopically harvested right saphenous vein  . ESOPHAGOGASTRODUODENOSCOPY    . ESOPHAGOGASTRODUODENOSCOPY N/A 10/05/2015   Procedure: ESOPHAGOGASTRODUODENOSCOPY (EGD);  Surgeon: Manus Gunning, MD;  Location: [facility];  Service: Gastroenterology;  Laterality: N/A;  . INTRAOPERATIVE TRANSESOPHAGEAL ECHOCARDIOGRAM N/A 12/17/2013   Procedure: INTRAOPERATIVE TRANSESOPHAGEAL ECHOCARDIOGRAM;  Surgeon:  Gaye Pollack, MD;  Location: [facility];  Service: Open Heart Surgery;  Laterality: N/A;  . lasix    . LEFT HEART CATHETERIZATION WITH CORONARY ANGIOGRAM N/A 12/13/2013   Procedure: LEFT HEART CATHETERIZATION WITH CORONARY ANGIOGRAM;  Surgeon: Jettie Booze, MD;  Location: [facility] CATH [facility];  Service: Cardiovascular;  Laterality: N/A;  . retina injection     Past Medical History:  Diagnosis Date  . Arthritis    hands  . CAD (coronary artery disease)    a. 2010: stents pRCA and dRCA. b. LHC 12/13/13: Minimal distal LM dz. LAD 95% ostial w/ L-->R collat. LCx mild dz. RCA 95% mid-dRCA. Patent stents proximal and distal RCA. EF not assessed, Nl by nuclear study.     . Central retinal vein occlusion, right eye   . Chronic kidney disease    hx kidney stone  . Depression   . Diplopia   . Ear pain    right  . Eosinophilic esophagitis   . Essential hypertension   . GERD (gastroesophageal reflux disease)   . H/O: whooping cough   . Hyperlipidemia with target LDL less than 70   . Incontinence    urine  . Stroke ([place]) 2001   reesultant Rt spastic hemplegia and aphasia    Ht 5\' 7"  (1.702 m)   BMI 27.33 kg/m   Opioid Risk Score:   Fall Risk Score:  `1  Depression screen PHQ 2/9  Depression screen [facility] 2/9 08/26/2017 11/11/2014  Decreased Interest 0 0  Down, Depressed, Hopeless 0 0  PHQ - 2 Score 0 0     Review of Systems  Constitutional: Negative.   HENT: Negative.   Eyes: Negative.   Respiratory: Negative.   Cardiovascular: Negative.   Gastrointestinal: Negative.   Endocrine: Negative.   Genitourinary: Positive for difficulty urinating.  Musculoskeletal: Positive for gait problem.  Skin: Negative.   Allergic/Immunologic: Negative.   Neurological: Positive for weakness.  Hematological: Negative.   Psychiatric/Behavioral: Negative.   All other systems reviewed and are negative.      Objective:   Physical Exam  General: No acute distress HEENT: EOMI, oral membranes  moist Cards: reg rate  Chest: normal effort Abdomen: Soft, NT, ND Skin: dry, intact Extremities: no edema Neuro:Patient communicates through short phrases and words as well as head nods.  Primarily expressive aphasia.  Right upper extremity with minimal voluntary movement.  She has 2+ to 3 out of 5 hip flexor and knee extensor strength right lower extremity.  Right ankle in splint with minimal active movement.  Left upper extremity and left lower extremity grossly 4+ to 5 out of 5.  Musculoskeletal:ongoing sublux.  Minimal pain with right knee range of motion today, passive or active.. Right hamstrings tight.  Psych:Pt's affect is pleasant  Assessment & Plan:  1. Hx of left MCA infarct with spastic right hemiparesis, expressive greater than receptive aphasia  2. .Primary osteoarthritis right knee 4. Insomnia, anxiety  5. Constipation 6. Right shoulder pain related to chronic subluxation, mild adhesive capsulitis, chronic spasticity  7. OSA.  8. Left rotator cuff tendonitis/bursitis;biciptial tendonitis--generally improved. 9. Right rotator cuff tendonitis/right shoulder sublux 10.  Right sided neuropathic pain 11. NON-healing left medial ankle wound.    Plan:  1.  can continue daily ambulation and exercise at facility.  Focus should be on mechanics and strengthening of the lower extremities, particularly the right side.  Also needs to improve hamstring flexibility. 2.Continue gabapentin 100 mg 3 times daily.  [place]  3. Continue right shoulder harness for support.  4. Santyl and moist dressing to left ankle. Directions were provided.   5.no interventions for knee. 6. I will see her back in6 months. 21minutes of face to face patient care time were spent during this visit. All questions were encouraged and answered.

## 2018-03-10 ENCOUNTER — Encounter: Payer: Self-pay | Admitting: Podiatry

## 2018-03-10 ENCOUNTER — Ambulatory Visit: Payer: Medicare Other | Admitting: Podiatry

## 2018-03-10 DIAGNOSIS — M79674 Pain in right toe(s): Secondary | ICD-10-CM

## 2018-03-10 DIAGNOSIS — M79675 Pain in left toe(s): Secondary | ICD-10-CM

## 2018-03-10 DIAGNOSIS — B351 Tinea unguium: Secondary | ICD-10-CM | POA: Diagnosis not present

## 2018-03-12 NOTE — Progress Notes (Signed)
Subjective: 77 y.o. returns the office today for painful, elongated, thickened toenails which she cannot trim herself. They deny any redness or drainage around the nails. Denies any acute changes since last appointment and no new complaints today. She also presents today to pick up a 2nd pair of diabetic shoes that she wanted to get. Denies any systemic complaints such as fevers, chills, nausea, vomiting.   PCP: Leanna Battles, MD  Objective: NAD-presents with family member  DP/PT pulses palpable, CRT less than 3 seconds Nails hypertrophic, dystrophic, elongated, brittle, discolored 10. There is tenderness overlying the nails 1-5 bilaterally. There is no surrounding erythema or drainage along the nail sites. No open lesions or pre-ulcerative lesions are identified. No other areas of tenderness bilateral lower extremities. No overlying edema, erythema, increased warmth. No pain with calf compression, swelling, warmth, erythema.  Assessment: Patient presents with symptomatic onychomycosis; improving ankle pain  Plan: -Treatment options including alternatives, risks, complications were discussed -Nails sharply debrided 10 without complication/bleeding. -Discussed daily foot inspection. If there are any changes, to call the office immediately.  -Shoes were dispensed today.  Break-in instructions were discussed.  They appear to be fitting well and she states they were comfortable.  Monitoring skin breakdown or irritation. -Follow-up in 3 months or sooner if any problems are to arise. In the meantime, encouraged to call the office with any questions, concerns, changes symptoms.  Celesta Gentile, DPM

## 2018-04-27 ENCOUNTER — Encounter (INDEPENDENT_AMBULATORY_CARE_PROVIDER_SITE_OTHER): Payer: Medicare Other | Admitting: Ophthalmology

## 2018-04-27 DIAGNOSIS — H348112 Central retinal vein occlusion, right eye, stable: Secondary | ICD-10-CM

## 2018-04-27 DIAGNOSIS — H43813 Vitreous degeneration, bilateral: Secondary | ICD-10-CM

## 2018-04-27 DIAGNOSIS — H35033 Hypertensive retinopathy, bilateral: Secondary | ICD-10-CM

## 2018-04-27 DIAGNOSIS — I10 Essential (primary) hypertension: Secondary | ICD-10-CM | POA: Diagnosis not present

## 2018-06-02 LAB — BASIC METABOLIC PANEL WITH GFR
BUN: 13 (ref 4–21)
CO2: 29 — AB (ref 13–22)
Chloride: 106 (ref 99–108)
Creatinine: 0.7 (ref 0.5–1.1)
Glucose: 93
Potassium: 4 (ref 3.4–5.3)
Sodium: 143 (ref 137–147)

## 2018-06-02 LAB — HEPATIC FUNCTION PANEL
ALT: 11 (ref 7–35)
AST: 15 (ref 13–35)
Alkaline Phosphatase: 78 (ref 25–125)
Bilirubin, Total: 0.8

## 2018-06-02 LAB — CBC: RBC: 5 (ref 3.87–5.11)

## 2018-06-02 LAB — MICROALBUMIN, URINE: Microalb, Ur: 53

## 2018-06-02 LAB — LIPID PANEL
Cholesterol: 145 (ref 0–200)
HDL: 42 (ref 35–70)
LDL Cholesterol: 89
Triglycerides: 71 (ref 40–160)

## 2018-06-02 LAB — CBC AND DIFFERENTIAL
Hemoglobin: 14.6 (ref 12.0–16.0)
Platelets: 202 (ref 150–399)
WBC: 4

## 2018-06-02 LAB — HEMOGLOBIN A1C: Hemoglobin A1C: 5.4

## 2018-06-02 LAB — COMPREHENSIVE METABOLIC PANEL
Albumin: 3.9 (ref 3.5–5.0)
Calcium: 8.9 (ref 8.7–10.7)

## 2018-06-11 ENCOUNTER — Ambulatory Visit: Payer: Medicare Other | Admitting: Podiatry

## 2018-06-18 ENCOUNTER — Other Ambulatory Visit: Payer: Self-pay

## 2018-06-18 ENCOUNTER — Ambulatory Visit: Payer: Medicare Other | Admitting: Podiatry

## 2018-06-18 DIAGNOSIS — M79674 Pain in right toe(s): Secondary | ICD-10-CM | POA: Diagnosis not present

## 2018-06-18 DIAGNOSIS — M79675 Pain in left toe(s): Secondary | ICD-10-CM | POA: Diagnosis not present

## 2018-06-18 DIAGNOSIS — B351 Tinea unguium: Secondary | ICD-10-CM | POA: Diagnosis not present

## 2018-06-18 NOTE — Patient Instructions (Signed)
Onychomycosis/Fungal Toenails  WHAT IS IT? An infection that lies within the keratin of your nail plate that is caused by a fungus.  WHY ME? Fungal infections affect all ages, sexes, races, and creeds.  There may be many factors that predispose you to a fungal infection such as age, coexisting medical conditions such as diabetes, or an autoimmune disease; stress, medications, fatigue, genetics, etc.  Bottom line: fungus thrives in a warm, moist environment and your shoes offer such a location.  IS IT CONTAGIOUS? Theoretically, yes.  You do not want to share shoes, nail clippers or files with someone who has fungal toenails.  Walking around barefoot in the same room or sleeping in the same bed is unlikely to transfer the organism.  It is important to realize, however, that fungus can spread easily from one nail to the next on the same foot.  HOW DO WE TREAT THIS?  There are several ways to treat this condition.  Treatment may depend on many factors such as age, medications, pregnancy, liver and kidney conditions, etc.  It is best to ask your doctor which options are available to you.  1. No treatment.   Unlike many other medical concerns, you can live with this condition.  However for many people this can be a painful condition and may lead to ingrown toenails or a bacterial infection.  It is recommended that you keep the nails cut short to help reduce the amount of fungal nail. 2. Topical treatment.  These range from herbal remedies to prescription strength nail lacquers.  About 40-50% effective, topicals require twice daily application for approximately 9 to 12 months or until an entirely new nail has grown out.  The most effective topicals are medical grade medications available through physicians offices. 3. Oral antifungal medications.  With an 80-90% cure rate, the most common oral medication requires 3 to 4 months of therapy and stays in your system for a year as the new nail grows out.  Oral  antifungal medications do require blood work to make sure it is a safe drug for you.  A liver function panel will be performed prior to starting the medication and after the first month of treatment.  It is important to have the blood work performed to avoid any harmful side effects.  In general, this medication safe but blood work is required. 4. Laser Therapy.  This treatment is performed by applying a specialized laser to the affected nail plate.  This therapy is noninvasive, fast, and non-painful.  It is not covered by insurance and is therefore, out of pocket.  The results have been very good with a 80-95% cure rate.  The [facility] is the only practice in the area to offer this therapy. 5. Permanent Nail Avulsion.  Removing the entire nail so that a new nail will not grow back.  Corns and Calluses Corns are small areas of thickened skin that occur on the top, sides, or tip of a toe. They contain a cone-shaped core with a point that can press on a nerve below. This causes pain.  Calluses are areas of thickened skin that can occur anywhere on the body, including the hands, fingers, palms, soles of the feet, and heels. Calluses are usually larger than corns. What are the causes? Corns and calluses are caused by rubbing (friction) or pressure, such as from shoes that are too tight or do not fit properly. What increases the risk? Corns are more likely to develop in people   who have misshapen toes (toe deformities), such as hammer toes. Calluses can occur with friction to any area of the skin. They are more likely to develop in people who:  Work with their hands.  Wear shoes that fit poorly, are too tight, or are high-heeled.  Have toe deformities. What are the signs or symptoms? Symptoms of a corn or callus include:  A hard growth on the skin.  Pain or tenderness under the skin.  Redness and swelling.  Increased discomfort while wearing tight-fitting shoes, if your feet are  affected. If a corn or callus becomes infected, symptoms may include:  Redness and swelling that gets worse.  Pain.  Fluid, blood, or pus draining from the corn or callus. How is this diagnosed? Corns and calluses may be diagnosed based on your symptoms, your medical history, and a physical exam. How is this treated? Treatment for corns and calluses may include:  Removing the cause of the friction or pressure. This may involve: ? Changing your shoes. ? Wearing shoe inserts (orthotics) or other protective layers in your shoes, such as a corn pad. ? Wearing gloves.  Applying medicine to the skin (topical medicine) to help soften skin in the hardened, thickened areas.  Removing layers of dead skin with a file to reduce the size of the corn or callus.  Removing the corn or callus with a scalpel or laser.  Taking antibiotic medicines, if your corn or callus is infected.  Having surgery, if a toe deformity is the cause. Follow these instructions at home:   Take over-the-counter and prescription medicines only as told by your health care provider.  If you were prescribed an antibiotic, take it as told by your health care provider. Do not stop taking it even if your condition starts to improve.  Wear shoes that fit well. Avoid wearing high-heeled shoes and shoes that are too tight or too loose.  Wear any padding, protective layers, gloves, or orthotics as told by your health care provider.  Soak your hands or feet and then use a file or pumice stone to soften your corn or callus. Do this as told by your health care provider.  Check your corn or callus every day for symptoms of infection. Contact a health care provider if you:  Notice that your symptoms do not improve with treatment.  Have redness or swelling that gets worse.  Notice that your corn or callus becomes painful.  Have fluid, blood, or pus coming from your corn or callus.  Have new symptoms. Summary  Corns are  small areas of thickened skin that occur on the top, sides, or tip of a toe.  Calluses are areas of thickened skin that can occur anywhere on the body, including the hands, fingers, palms, and soles of the feet. Calluses are usually larger than corns.  Corns and calluses are caused by rubbing (friction) or pressure, such as from shoes that are too tight or do not fit properly.  Treatment may include wearing any padding, protective layers, gloves, or orthotics as told by your health care provider. This information is not intended to replace advice given to you by your health care provider. Make sure you discuss any questions you have with your health care provider. Document Released: 01/13/2004 Document Revised: 02/19/2017 Document Reviewed: 02/19/2017 Elsevier Interactive Patient Education  2019 [employer]  

## 2018-06-27 ENCOUNTER — Encounter: Payer: Self-pay | Admitting: Podiatry

## 2018-06-27 NOTE — Progress Notes (Signed)
Subjective: Kristin [name] is a 78 y.o. y.o. female who presents today with painful, discolored, thick toenails  which interfere with daily activities. Pain is aggravated when wearing enclosed shoe gear. Pain is relieved with periodic professional debridement.   Patient's PCP is Kristin Battles, MD .  Kristin [name] remains on Plavix.   Current Outpatient Medications:  .  amLODipine (NORVASC) 2.5 MG tablet, Take 2.5 mg by mouth daily., Disp: , Rfl:  .  amoxicillin (AMOXIL) 500 MG capsule, , Disp: , Rfl:  .  aspirin 81 MG tablet, Take 81 mg by mouth daily., Disp: , Rfl:  .  atorvastatin (LIPITOR) 20 MG tablet, Take 20 mg daily at 6 PM by mouth. , Disp: , Rfl:  .  clopidogrel (PLAVIX) 75 MG tablet, Take 75 mg by mouth daily., Disp: , Rfl:  .  Co-Enzyme Q-10 30 MG CAPS, Take by mouth., Disp: , Rfl:  .  Coenzyme Q10 (COQ-10 PO), Take 1 tablet daily by mouth., Disp: , Rfl:  .  collagenase (SANTYL) ointment, Apply 1 application topically daily. To left ankle daily to twice daily, Disp: 15 g, Rfl: 2 .  desmopressin (DDAVP) 0.2 MG tablet, Take 0.2 mg by mouth daily., Disp: , Rfl:  .  diclofenac sodium (VOLTAREN) 1 % GEL, Apply 4 g topically 4 (four) times daily as needed (pain). , Disp: , Rfl:  .  ferrous sulfate 325 (65 FE) MG tablet, Take 325 mg daily with breakfast by mouth., Disp: , Rfl:  .  gabapentin (NEURONTIN) 100 MG capsule, Take 1 capsule (100 mg total) by mouth 3 (three) times daily., Disp: 270 capsule, Rfl: 3 .  losartan (COZAAR) 25 MG tablet, Take 25 mg by mouth daily., Disp: , Rfl:  .  mirabegron ER (MYRBETRIQ) 50 MG TB24 tablet, Take by mouth., Disp: , Rfl:  .  nitroGLYCERIN (NITROSTAT) 0.4 MG SL tablet, Place 1 tablet (0.4 mg total) under the tongue every 5 (five) minutes as needed for chest pain., Disp: 30 tablet, Rfl: 0 .  omeprazole (PRILOSEC) 20 MG capsule, Take 20 mg by mouth daily., Disp: , Rfl:  .  sertraline (ZOLOFT) 50 MG tablet, Take 75 mg by mouth every evening. , Disp: , Rfl:  .   traMADol (ULTRAM) 50 MG tablet, , Disp: , Rfl:  .  TURMERIC PO, Take by mouth., Disp: , Rfl:  .  Wheat Dextrin (BENEFIBER DRINK MIX PO), Take 15 mLs daily by mouth., Disp: , Rfl:    No Known Allergies   Objective: Vascular Examination: Capillary refill time <3 seconds x 10 digits.  Dorsalis pedis pulses palpable b/l.  Posterior tibial pulses palpable b/l.  No digital hair x 10 digits.  Skin temperature gradient WNL b/l.  Dermatological Examination: Skin with normal turgor, texture and tone b/l.  Toenails 1-5 b/l discolored, thick, dystrophic with subungual debris and pain with palpation to nailbeds due to thickness of nails.  Right great toe nailplate embedded distally with erythema noted at distal tip. No drainage, no warmth, no flocculence.  Musculoskeletal: Muscle strength 5/5 to all LE muscle groups.  AFO right LE.  Neurological: Sensation intact with 10 gram monofilament.  Vibratory sensation intact.  Assessment: Painful onychomycosis toenails 1-5 b/l in patient on blood thinner.   Plan: 1. Toenails 1-5 b/l were debrided in length and girth without iatrogenic bleeding. Distal edge of nail plate freed using currette.  2. Patient to continue soft, supportive shoe gear. 3. Patient to report any pedal injuries to medical professional immediately. 4. Avoid  self trimming due to use of blood thinner. 5. Follow up 3 months.  6. Patient/POA to call should there be a concern in the interim.

## 2018-08-25 ENCOUNTER — Ambulatory Visit: Payer: Medicare Other | Admitting: Physical Medicine & Rehabilitation

## 2018-09-15 ENCOUNTER — Encounter: Payer: Self-pay | Admitting: Physical Medicine & Rehabilitation

## 2018-09-15 ENCOUNTER — Other Ambulatory Visit: Payer: Self-pay

## 2018-09-15 ENCOUNTER — Encounter: Payer: Medicare Other | Attending: Physical Medicine & Rehabilitation | Admitting: Physical Medicine & Rehabilitation

## 2018-09-15 VITALS — BP 101/71 | HR 69 | Temp 98.4°F

## 2018-09-15 DIAGNOSIS — G8110 Spastic hemiplegia affecting unspecified side: Secondary | ICD-10-CM

## 2018-09-15 DIAGNOSIS — M792 Neuralgia and neuritis, unspecified: Secondary | ICD-10-CM

## 2018-09-15 DIAGNOSIS — M1711 Unilateral primary osteoarthritis, right knee: Secondary | ICD-10-CM | POA: Diagnosis not present

## 2018-09-15 NOTE — Progress Notes (Signed)
Subjective:    Patient ID: Kristin [name], female    DOB: Aug 20, 1940, 78 y.o.   MRN: [id number]  HPI   Due to national recommendations of social distancing because of COVID 24, an audio/video tele-health visit is felt to be the most appropriate encounter for this patient at this time. See MyChart message from today for the patient's consent to a tele-health encounter with [facility]. This is a follow up tele-visit via Webex. The patient is at home. MD is at office.    I am meeting with the patient today regarding left MCA infarct and associated deficits.  Her daughter reports that she has not been moving as much as she had been previously prior to COVID due to some of the restrictions going on.  She is not doing as many classes etc.  She has been getting up some.  However, daughter has seen some decline in her functional mobility.  Pain wise she is doing fairly well.  Still has some issues with the left shoulder as well as pain in the right arm and right leg..  The patient demonstrated some of the stretches she is doing on her own.  She remains on gabapentin for neuropathic pain as well as Voltaren gel and occasionally tramadol.  Her left heel wound has resolved.  Mood has been good.  Apparently she is eating well.    Pain Inventory Average Pain 3 Pain Right Now 3 My pain is stabbing and aching  In the last 24 hours, has pain interfered with the following? General activity 3 Relation with others 3 Enjoyment of life 3 What TIME of day is your pain at its worst? night Sleep (in general) Fair  Pain is worse with: sitting Pain improves with: medication Relief from Meds: 3  Mobility use a walker use a wheelchair transfers alone  Function retired  Neuro/Psych bladder control problems weakness trouble walking  Prior Studies Any changes since last visit?  no  Physicians involved in your care Any changes since last visit?  no   Family History   Problem Relation Age of Onset  . Diabetes Mother   . Heart disease Mother   . Heart attack Father   . Breast cancer Sister    Social History   Socioeconomic History  . Marital status: Married    Spouse name: Not on file  . Number of children: 2  . Years of education: Not on file  . Highest education level: Not on file  Occupational History  . Occupation: retired Education officer, museum  Social Needs  . Financial resource strain: Not on file  . Food insecurity:    Worry: Not on file    Inability: Not on file  . Transportation needs:    Medical: Not on file    Non-medical: Not on file  Tobacco Use  . Smoking status: Never Smoker  . Smokeless tobacco: Never Used  Substance and Sexual Activity  . Alcohol use: Yes    Alcohol/week: 0.0 standard drinks    Comment: glass of wine a day  . Drug use: No  . Sexual activity: Never  Lifestyle  . Physical activity:    Days per week: Not on file    Minutes per session: Not on file  . Stress: Not on file  Relationships  . Social connections:    Talks on phone: Not on file    Gets together: Not on file    Attends religious service: Not on file  Active member of club or organization: Not on file    Attends meetings of clubs or organizations: Not on file    Relationship status: Not on file  Other Topics Concern  . Not on file  Social History Narrative  . Not on file   Past Surgical History:  Procedure Laterality Date  . ABDOMINAL HYSTERECTOMY    . CAROTID STENT    . cataracts    . COLONOSCOPY N/A 10/05/2015   Procedure: COLONOSCOPY;  Surgeon: Manus Gunning, MD;  Location: [facility];  Service: Gastroenterology;  Laterality: N/A;  . CORONARY ARTERY BYPASS GRAFT N/A 12/17/2013   Procedure: CORONARY ARTERY BYPASS GRAFTING (CABG);  Surgeon: Gaye Pollack, MD;  Location: [facility];  Service: Open Heart Surgery;  Laterality: N/A;  Times 2 using left internal mammary artery and endoscopically harvested right saphenous vein  .  ESOPHAGOGASTRODUODENOSCOPY    . ESOPHAGOGASTRODUODENOSCOPY N/A 10/05/2015   Procedure: ESOPHAGOGASTRODUODENOSCOPY (EGD);  Surgeon: Manus Gunning, MD;  Location: [facility];  Service: Gastroenterology;  Laterality: N/A;  . INTRAOPERATIVE TRANSESOPHAGEAL ECHOCARDIOGRAM N/A 12/17/2013   Procedure: INTRAOPERATIVE TRANSESOPHAGEAL ECHOCARDIOGRAM;  Surgeon: Gaye Pollack, MD;  Location: [facility];  Service: Open Heart Surgery;  Laterality: N/A;  . lasix    . LEFT HEART CATHETERIZATION WITH CORONARY ANGIOGRAM N/A 12/13/2013   Procedure: LEFT HEART CATHETERIZATION WITH CORONARY ANGIOGRAM;  Surgeon: Jettie Booze, MD;  Location: [facility];  Service: Cardiovascular;  Laterality: N/A;  . retina injection     Past Medical History:  Diagnosis Date  . Arthritis    hands  . CAD (coronary artery disease)    a. 2010: stents pRCA and dRCA. b. LHC 12/13/13: Minimal distal LM dz. LAD 95% ostial w/ L-->R collat. LCx mild dz. RCA 95% mid-dRCA. Patent stents proximal and distal RCA. EF not assessed, Nl by nuclear study.     . Central retinal vein occlusion, right eye   . Chronic kidney disease    hx kidney stone  . Depression   . Diplopia   . Ear pain    right  . Eosinophilic esophagitis   . Essential hypertension   . GERD (gastroesophageal reflux disease)   . H/O: whooping cough   . Hyperlipidemia with target LDL less than 70   . Incontinence    urine  . Stroke ([place]) 2001   reesultant Rt spastic hemplegia and aphasia    BP 101/71   Pulse 69   Temp 98.4 F (36.9 C)   SpO2 95%   Opioid Risk Score:   Fall Risk Score:  `1  Depression screen PHQ 2/9  Depression screen [facility] 2/9 08/26/2017 11/11/2014  Decreased Interest 0 0  Down, Depressed, Hopeless 0 0  PHQ - 2 Score 0 0     Review of Systems  Constitutional: Negative.   HENT: Negative.   Eyes: Negative.   Respiratory: Negative.   Cardiovascular: Negative.   Gastrointestinal: Negative.   Endocrine: Negative.   Genitourinary:  Positive for difficulty urinating.  Musculoskeletal: Positive for arthralgias, gait problem and myalgias.  Skin: Negative.   Allergic/Immunologic: Negative.   Neurological: Positive for weakness.  Hematological: Negative.   Psychiatric/Behavioral: Negative.   All other systems reviewed and are negative.      Assessment & Plan:  1. Hx of left MCA infarct with spastic right hemiparesis, expressive greater than receptive aphasia  2. .Primary osteoarthritis right knee 4. Insomnia, anxiety  5. Constipation 6. Right shoulder pain related to chronic subluxation, mild adhesive capsulitis, chronic  spasticity  7. OSA.  8. Left rotator cuff tendonitis/bursitis;biciptial tendonitis--occasional pain. 9. Right rotator cuff tendonitis/right shoulder sublux 10.Right sided neuropathic pain 11.   left medial ankle wound: healed   Plan:  1. can continue daily ambulation and exercise at facility.  Should be trying to walk as much as possible. Also discussed left shoulder/scapular ROM exercises as well 2.Continuegabapentin 100 mg3 times daily. no RF today  3. Continue right shoulder harness for support.  4. Left ankle healed per daughter 5.no interventions for knee at present. 6. I will see her back in6 mos.10 min of face to face patient care time were spent during this visit. All questions were encouraged and answered.

## 2018-09-17 ENCOUNTER — Ambulatory Visit: Payer: Medicare Other | Admitting: Podiatry

## 2018-09-17 ENCOUNTER — Other Ambulatory Visit: Payer: Self-pay

## 2018-09-17 ENCOUNTER — Encounter: Payer: Self-pay | Admitting: Podiatry

## 2018-09-17 VITALS — Temp 97.3°F

## 2018-09-17 DIAGNOSIS — G5791 Unspecified mononeuropathy of right lower limb: Secondary | ICD-10-CM

## 2018-09-17 DIAGNOSIS — M79675 Pain in left toe(s): Secondary | ICD-10-CM | POA: Diagnosis not present

## 2018-09-17 DIAGNOSIS — M79674 Pain in right toe(s): Secondary | ICD-10-CM | POA: Diagnosis not present

## 2018-09-17 DIAGNOSIS — L84 Corns and callosities: Secondary | ICD-10-CM | POA: Diagnosis not present

## 2018-09-17 DIAGNOSIS — B351 Tinea unguium: Secondary | ICD-10-CM

## 2018-09-17 NOTE — Patient Instructions (Signed)

## 2018-09-20 ENCOUNTER — Encounter: Payer: Self-pay | Admitting: Podiatry

## 2018-09-20 NOTE — Progress Notes (Signed)
Subjective:  Kristin [name] presents to clinic today with cc of  painful, thick, discolored, elongated toenails 1-5 b/l that become tender and cannot cut because of thickness. Pain is aggravated when wearing enclosed shoe gear.  She has spastic hemiplegia of her right side secondary to CVA. She takes gabapentin for neuropathic pain of right lower extremity.  Patient's daughter is present during the visit.  Leanna Battles, MD is her PCP.    Current Outpatient Medications:  .  amLODipine (NORVASC) 2.5 MG tablet, Take 2.5 mg by mouth daily., Disp: , Rfl:  .  amoxicillin (AMOXIL) 500 MG capsule, , Disp: , Rfl:  .  aspirin 81 MG tablet, Take 81 mg by mouth daily., Disp: , Rfl:  .  atorvastatin (LIPITOR) 20 MG tablet, Take 20 mg daily at 6 PM by mouth. , Disp: , Rfl:  .  clopidogrel (PLAVIX) 75 MG tablet, Take 75 mg by mouth daily., Disp: , Rfl:  .  Co-Enzyme Q-10 30 MG CAPS, Take by mouth., Disp: , Rfl:  .  Coenzyme Q10 (COQ-10 PO), Take 1 tablet daily by mouth., Disp: , Rfl:  .  collagenase (SANTYL) ointment, Apply 1 application topically daily. To left ankle daily to twice daily, Disp: 15 g, Rfl: 2 .  desmopressin (DDAVP) 0.2 MG tablet, Take 0.2 mg by mouth daily., Disp: , Rfl:  .  diclofenac sodium (VOLTAREN) 1 % GEL, Apply 4 g topically 4 (four) times daily as needed (pain). , Disp: , Rfl:  .  ferrous sulfate 325 (65 FE) MG tablet, Take 325 mg daily with breakfast by mouth., Disp: , Rfl:  .  gabapentin (NEURONTIN) 100 MG capsule, Take 1 capsule (100 mg total) by mouth 3 (three) times daily., Disp: 270 capsule, Rfl: 3 .  losartan (COZAAR) 25 MG tablet, Take 25 mg by mouth daily., Disp: , Rfl:  .  mirabegron ER (MYRBETRIQ) 50 MG TB24 tablet, Take by mouth., Disp: , Rfl:  .  nitroGLYCERIN (NITROSTAT) 0.4 MG SL tablet, Place 1 tablet (0.4 mg total) under the tongue every 5 (five) minutes as needed for chest pain., Disp: 30 tablet, Rfl: 0 .  omeprazole (PRILOSEC) 20 MG capsule, Take 20 mg by mouth  daily., Disp: , Rfl:  .  sertraline (ZOLOFT) 50 MG tablet, Take 75 mg by mouth every evening. , Disp: , Rfl:  .  traMADol (ULTRAM) 50 MG tablet, , Disp: , Rfl:  .  TURMERIC PO, Take by mouth., Disp: , Rfl:  .  Wheat Dextrin (BENEFIBER DRINK MIX PO), Take 15 mLs daily by mouth., Disp: , Rfl:    No Known Allergies   Objective: Vitals:   09/17/18 1351  Temp: (!) 97.3 F (36.3 C)    Physical Examination:  Vascular Examination: Capillary refill time <3 seconds x 10 digits.  Palpable DP/PT pulses b/l.  Digital hair absent b/l.  No edema noted b/l.  Skin temperature gradient WNL b/l.  Dermatological Examination: Skin with normal turgor, texture and tone b/l.  Partial thickness lesion distal tip right 2nd toe. No erythema, no edema, no drainage, no flocculence.   No interdigital macerations noted b/l.  Elongated, thick, discolored brittle toenails with subungual debris and pain on dorsal palpation of nailbeds 1-5 b/l.  Musculoskeletal Examination: Muscle strength 5/5 to all muscle groups LLE. Clawtoes noted 1-5 right.   AFO right lower extremity  Spasticity RLE.  No pain, crepitus or joint discomfort with active/passive ROM.  Neurological Examination: Sensation intact 5/5 b/l with 10 gram monofilament.  Vibratory  sensation intact b/l.  Proprioceptive sensation intact b/l.  Assessment: Mycotic nail infection with pain 1-5 b/l Preulcerative corn right 2nd digit Neuropathy right lower extremity secondary to CVA  Plan: 1. Toenails 1-5 b/l were debrided in length and girth without iatrogenic laceration. 2. Debride preulcerative corn distal tip right 2nd digit. Cleansed with wound cleanser. Antibiotic ointment and bandaid applied with instructions for her to change once daily. Offloaded AFO with felt pad for 2nd digit. Extra felt/foam pads given for AFO. She has silicone toe pads for digit. 3. Continue soft, supportive shoe gear daily. 4. Report any pedal injuries to  medical professional. 5. Follow up 3 months. Call if any problems arise with right 2nd digit. 6. Patient/POA to call should there be a question/concern in there interim.

## 2018-10-29 LAB — BASIC METABOLIC PANEL
BUN: 21 (ref 4–21)
CO2: 24 — AB (ref 13–22)
Chloride: 107 (ref 99–108)
Creatinine: 0.7 (ref 0.5–1.1)
Glucose: 105
Potassium: 4.1 (ref 3.4–5.3)
Sodium: 141 (ref 137–147)

## 2018-10-29 LAB — CBC AND DIFFERENTIAL
HCT: 47 — AB (ref 36–46)
Hemoglobin: 15.1 (ref 12.0–16.0)
Platelets: 224 (ref 150–399)
WBC: 6

## 2018-10-29 LAB — HEPATIC FUNCTION PANEL
ALT: 10 (ref 7–35)
AST: 14 (ref 13–35)
Alkaline Phosphatase: 80 (ref 25–125)
Bilirubin, Total: 0.6

## 2018-10-29 LAB — CBC: RBC: 5.1 (ref 3.87–5.11)

## 2018-10-29 LAB — COMPREHENSIVE METABOLIC PANEL: Albumin: 4.1 (ref 3.5–5.0)

## 2018-10-29 LAB — TSH: TSH: 1.95 (ref 0.41–5.90)

## 2018-12-22 ENCOUNTER — Ambulatory Visit: Payer: Medicare Other | Admitting: Podiatry

## 2018-12-31 ENCOUNTER — Other Ambulatory Visit: Payer: Self-pay | Admitting: Physical Medicine & Rehabilitation

## 2018-12-31 DIAGNOSIS — M792 Neuralgia and neuritis, unspecified: Secondary | ICD-10-CM

## 2019-01-27 ENCOUNTER — Encounter (INDEPENDENT_AMBULATORY_CARE_PROVIDER_SITE_OTHER): Payer: Medicare Other | Admitting: Ophthalmology

## 2019-02-16 ENCOUNTER — Encounter: Payer: Self-pay | Admitting: Podiatry

## 2019-02-16 ENCOUNTER — Ambulatory Visit: Payer: Medicare Other | Admitting: Podiatry

## 2019-02-16 ENCOUNTER — Other Ambulatory Visit: Payer: Self-pay

## 2019-02-16 DIAGNOSIS — L84 Corns and callosities: Secondary | ICD-10-CM | POA: Diagnosis not present

## 2019-02-16 DIAGNOSIS — B351 Tinea unguium: Secondary | ICD-10-CM

## 2019-02-16 DIAGNOSIS — G5791 Unspecified mononeuropathy of right lower limb: Secondary | ICD-10-CM | POA: Diagnosis not present

## 2019-02-16 DIAGNOSIS — M79675 Pain in left toe(s): Secondary | ICD-10-CM | POA: Diagnosis not present

## 2019-02-16 DIAGNOSIS — M79674 Pain in right toe(s): Secondary | ICD-10-CM

## 2019-02-16 NOTE — Patient Instructions (Signed)

## 2019-02-19 NOTE — Progress Notes (Signed)
Subjective: Kristin [name] presents to clinic with cc of painful mycotic toenails and corns b/l feet. She has h/o neuropathy RLE secondary to CVA. Daughter relates improvement in corns due to felt padding applied to AFO.   Current Outpatient Medications on File Prior to Visit  Medication Sig Dispense Refill  . amLODipine (NORVASC) 2.5 MG tablet Take 2.5 mg by mouth daily.    Marland Kitchen amoxicillin (AMOXIL) 500 MG capsule     . aspirin 81 MG tablet Take 81 mg by mouth daily.    Marland Kitchen atorvastatin (LIPITOR) 20 MG tablet Take 20 mg daily at 6 PM by mouth.     . clopidogrel (PLAVIX) 75 MG tablet Take 75 mg by mouth daily.    Marland Kitchen Co-Enzyme Q-10 30 MG CAPS Take by mouth.    . Coenzyme Q10 (COQ-10 PO) Take 1 tablet daily by mouth.    . collagenase (SANTYL) ointment Apply 1 application topically daily. To left ankle daily to twice daily 15 g 2  . desmopressin (DDAVP) 0.2 MG tablet Take 0.2 mg by mouth daily.    . diclofenac sodium (VOLTAREN) 1 % GEL Apply 4 g topically 4 (four) times daily as needed (pain).     Marland Kitchen doxycycline (VIBRAMYCIN) 100 MG capsule Take 100 mg by mouth 2 (two) times daily.    . ferrous sulfate 325 (65 FE) MG tablet Take 325 mg daily with breakfast by mouth.    . gabapentin (NEURONTIN) 100 MG capsule TAKE (1) CAPSULE THREE TIMES DAILY. 270 capsule 0  . losartan (COZAAR) 25 MG tablet Take 25 mg by mouth daily.    . mirabegron ER (MYRBETRIQ) 50 MG TB24 tablet Take by mouth.    . nitroGLYCERIN (NITROSTAT) 0.4 MG SL tablet Place 1 tablet (0.4 mg total) under the tongue every 5 (five) minutes as needed for chest pain. 30 tablet 0  . omeprazole (PRILOSEC) 20 MG capsule Take 20 mg by mouth daily.    . sertraline (ZOLOFT) 50 MG tablet Take 75 mg by mouth every evening.     . traMADol (ULTRAM) 50 MG tablet     . TURMERIC PO Take by mouth.    . Wheat Dextrin (BENEFIBER DRINK MIX PO) Take 15 mLs daily by mouth.     No current facility-administered medications on file prior to visit.    No Known Allergies    Objective: There were no vitals filed for this visit.  Physical Examination:  Vascular  Examination: Capillary refill time <3 seconds b/l.  Palpable DP/PT pulses b/l.  Digital hair absent b/l.   No edema noted b/l.  Skin temperature gradient WNL b/l.  Dermatological Examination: Skin with normal turgor, texture and tone b/l.  No open wounds b/l.  No interdigital macerations noted b/l.  Elongated, thick, discolored brittle toenails with subungual debris and pain on dorsal palpation of nailbeds 1-5 b/l.  Hyperkeratotic lesion distal tip right 2nd digit and distal tip left 3rd digit with tenderness to palpation. No edema, no erythema, no drainage, no flocculence.    Musculoskeletal Examination: Muscle strength 5/5 to all muscle groups of LLE. Spastic hemiplegia RLE.  AFO RLE.  Clawtoes noted 1-5 right foot.  Neurological Examination: Sensation intact 5/5 LLE with 10 gram monofilament; decreased RLE.  Vibratory sensation intact b/l.  Proprioceptive sensation intact b/l.  Assessment: 1. Mycotic nail infection with pain 1-5 b/l 2. Corn right 2nd, left 3rd digit 3. Neuropathy RLE secondary to CVA  Plan: 1. Toenails 1-5 b/l were debrided in length and girth without  iatrogenic laceration. 2. Corn(s) pared left 3rd digit utilizing sterile scalpel blade without incident. Iatrogenic laceration sustained during debridement. Treated with Lumicain Hemostatic Solution and alcohol. Light dressing applied. Patient instructed to apply triple antibiotic ointment to right 2nd digit once daily for one week.  3. Continue felt padding for AFO RLE. Continue silicone pads for corns daily.  4. Continue soft, supportive shoe gear daily. 5. Report any pedal injuries to medical professional. 6. Follow up 3 months. 7. Patient/POA to call should there be a question/concern in there interim.

## 2019-02-25 ENCOUNTER — Encounter (INDEPENDENT_AMBULATORY_CARE_PROVIDER_SITE_OTHER): Payer: Medicare Other | Admitting: Ophthalmology

## 2019-02-25 DIAGNOSIS — H43813 Vitreous degeneration, bilateral: Secondary | ICD-10-CM | POA: Diagnosis not present

## 2019-02-25 DIAGNOSIS — H35033 Hypertensive retinopathy, bilateral: Secondary | ICD-10-CM

## 2019-02-25 DIAGNOSIS — I10 Essential (primary) hypertension: Secondary | ICD-10-CM | POA: Diagnosis not present

## 2019-02-25 DIAGNOSIS — H348112 Central retinal vein occlusion, right eye, stable: Secondary | ICD-10-CM | POA: Diagnosis not present

## 2019-03-04 ENCOUNTER — Other Ambulatory Visit: Payer: Self-pay | Admitting: Urology

## 2019-03-04 DIAGNOSIS — N200 Calculus of kidney: Secondary | ICD-10-CM

## 2019-03-04 DIAGNOSIS — R3129 Other microscopic hematuria: Secondary | ICD-10-CM

## 2019-03-16 ENCOUNTER — Ambulatory Visit
Admission: RE | Admit: 2019-03-16 | Discharge: 2019-03-16 | Disposition: A | Discharge status: Home / Self Care | Payer: Medicare Other | Source: Ambulatory Visit | Attending: Urology | Admitting: Urology

## 2019-03-16 ENCOUNTER — Ambulatory Visit: Payer: Medicare Other | Admitting: Physical Medicine & Rehabilitation

## 2019-03-16 DIAGNOSIS — R3129 Other microscopic hematuria: Secondary | ICD-10-CM

## 2019-03-16 DIAGNOSIS — N200 Calculus of kidney: Secondary | ICD-10-CM

## 2019-03-16 DIAGNOSIS — R310 Gross hematuria: Secondary | ICD-10-CM | POA: Insufficient documentation

## 2019-03-16 MED ORDER — IOPAMIDOL (ISOVUE-300) INJECTION 61%
125.0000 mL | Freq: Once | INTRAVENOUS | Status: AC | PRN
  Administered 2019-03-16: 125 mL via INTRAVENOUS

## 2019-04-04 ENCOUNTER — Other Ambulatory Visit: Payer: Self-pay | Admitting: Physical Medicine & Rehabilitation

## 2019-04-04 DIAGNOSIS — M792 Neuralgia and neuritis, unspecified: Secondary | ICD-10-CM

## 2019-04-08 ENCOUNTER — Encounter: Payer: Self-pay | Admitting: Internal Medicine

## 2019-04-08 ENCOUNTER — Other Ambulatory Visit: Payer: Self-pay

## 2019-04-08 ENCOUNTER — Ambulatory Visit (INDEPENDENT_AMBULATORY_CARE_PROVIDER_SITE_OTHER): Payer: Medicare Other | Admitting: Internal Medicine

## 2019-04-08 VITALS — BP 100/70 | HR 66 | Temp 97.7°F | Ht 66.0 in | Wt 152.0 lb

## 2019-04-08 DIAGNOSIS — I69351 Hemiplegia and hemiparesis following cerebral infarction affecting right dominant side: Secondary | ICD-10-CM | POA: Diagnosis not present

## 2019-04-08 DIAGNOSIS — M4802 Spinal stenosis, cervical region: Secondary | ICD-10-CM

## 2019-04-08 DIAGNOSIS — I69320 Aphasia following cerebral infarction: Secondary | ICD-10-CM

## 2019-04-08 NOTE — Progress Notes (Signed)
Provider:  Dr. Hollace Kinnier Location:   [facility]   Place of Service:     PCP: Gayland Curry, DO Patient Care Team: Gayland Curry, DO as PCP - General ([place]) Maudie Mercury, [place] (Inactive) as Referring Physician (Nurse Practitioner)  Extended Emergency Contact Information Primary Emergency Contact: Adah Salvage, [place] [address] [place] Phone: [phone] Relation: Daughter Secondary Emergency Contact: Jonna Munro [place] Mobile Phone: [phone] Relation: Relative   Goals of Care: Advanced Directive information Advanced Directives 04/08/2019  Does Patient Have a Medical Advance Directive? Yes  Type of Paramedic of [place];Living will;Out of facility DNR (pink MOST or yellow form)  Does patient want to make changes to medical advance directive? No - Patient declined  Copy of [place] in Chart? No - copy requested  Would patient like information on creating a medical advance directive? -  Pre-existing out of facility DNR order (yellow form or pink MOST form) Yellow form placed in chart (order not valid for inpatient use)      Chief Complaint  Patient presents with  . Establish Care    new patient    HPI: Patient is a 78 y.o. female seen today to establish new patient care. She was being seen by Valetta Fuller with [facility] prior to this appointment.   Daughter present during visit today.   She currently resides at [facility] independent living. Has lived there with her husband since 2013.   In 2001 she suffered a stroke and suffered several deficits like aphasia, right sided weakness, overactive bladder and limited mobility. In her apartment she ambulates using a walker. She will use a scoter or wheelchair for long distances or trips. Brace on her right lower leg present today. No recent injuries or falls reported.   She eats about 3  meals a day. No dietary restictions. Tries to hydrate and drink throughout the day. Denies trouble swallowing.   Her main complaint is right sided pain that started about 8 weeks ago. The pain starts at her right temporal area and radiates to her right foot. All areas are tender to touch. She currently takes gabapentin 100 mg three times a day. She will also take tylenol Prn for pain. The pain is consistent throughout the day. She describes the pain as constant.   She has right sided pain in the past that was related to a kidney stone. She is recently been seen by urology and a CT of abdomen was done. CT confirmed a stone.   Her overactive bladder is controlled with Myrbetriq during the day and DDVAP at night. She will have incontinent episodes occasionally, but states the medication decreases incontinent episodes.       Past Medical History:  Diagnosis Date  . Arthritis    hands  . CAD (coronary artery disease)    a. 2010: stents pRCA and dRCA. b. LHC 12/13/13: Minimal distal LM dz. LAD 95% ostial w/ L-->R collat. LCx mild dz. RCA 95% mid-dRCA. Patent stents proximal and distal RCA. EF not assessed, Nl by nuclear study.     . Central retinal vein occlusion, right eye   . Chronic kidney disease    hx kidney stone  . Depression   . Diplopia   . Ear pain    right  . Eosinophilic esophagitis   . Essential hypertension   . GERD (gastroesophageal reflux disease)   .  H/O: whooping cough   . Hyperlipidemia with target LDL less than 70   . Incontinence    urine  . Stroke [facility]) 2001   reesultant Rt spastic hemplegia and aphasia    Past Surgical History:  Procedure Laterality Date  . ABDOMINAL HYSTERECTOMY    . CAROTID STENT    . cataracts    . COLONOSCOPY N/A 10/05/2015   Procedure: COLONOSCOPY;  Surgeon: Manus Gunning, MD;  Location: [facility];  Service: Gastroenterology;  Laterality: N/A;  . CORONARY ARTERY BYPASS GRAFT N/A 12/17/2013   Procedure: CORONARY ARTERY BYPASS  GRAFTING (CABG);  Surgeon: Gaye Pollack, MD;  Location: [place];  Service: Open Heart Surgery;  Laterality: N/A;  Times 2 using left internal mammary artery and endoscopically harvested right saphenous vein  . ESOPHAGOGASTRODUODENOSCOPY    . ESOPHAGOGASTRODUODENOSCOPY N/A 10/05/2015   Procedure: ESOPHAGOGASTRODUODENOSCOPY (EGD);  Surgeon: Manus Gunning, MD;  Location: [facility];  Service: Gastroenterology;  Laterality: N/A;  . INTRAOPERATIVE TRANSESOPHAGEAL ECHOCARDIOGRAM N/A 12/17/2013   Procedure: INTRAOPERATIVE TRANSESOPHAGEAL ECHOCARDIOGRAM;  Surgeon: Gaye Pollack, MD;  Location: [place] OR;  Service: Open Heart Surgery;  Laterality: N/A;  . lasix    . LEFT HEART CATHETERIZATION WITH CORONARY ANGIOGRAM N/A 12/13/2013   Procedure: LEFT HEART CATHETERIZATION WITH CORONARY ANGIOGRAM;  Surgeon: Jettie Booze, MD;  Location: [facility] CATH LAB;  Service: Cardiovascular;  Laterality: N/A;  . retina injection      reports that she has never smoked. She has never used smokeless tobacco. She reports current alcohol use. She reports that she does not use drugs. Social History   Socioeconomic History  . Marital status: Married    Spouse name: Not on file  . Number of children: 2  . Years of education: Not on file  . Highest education level: Not on file  Occupational History  . Occupation: retired Education officer, museum  Tobacco Use  . Smoking status: Never Smoker  . Smokeless tobacco: Never Used  Substance and Sexual Activity  . Alcohol use: Yes    Alcohol/week: 0.0 standard drinks    Comment: glass of wine a day  . Drug use: No  . Sexual activity: Never  Other Topics Concern  . Not on file  Social History Narrative  . Not on file   Social Determinants of Health   Financial Resource Strain:   . Difficulty of Paying Living Expenses: Not on file  Food Insecurity:   . Worried About Charity fundraiser in the Last Year: Not on file  . Ran Out of Food in the Last Year: Not on file    Transportation Needs:   . Lack of Transportation (Medical): Not on file  . Lack of Transportation (Non-Medical): Not on file  Physical Activity:   . Days of Exercise per Week: Not on file  . Minutes of Exercise per Session: Not on file  Stress:   . Feeling of Stress : Not on file  Social Connections:   . Frequency of Communication with Friends and Family: Not on file  . Frequency of Social Gatherings with Friends and Family: Not on file  . Attends Religious Services: Not on file  . Active Member of Clubs or Organizations: Not on file  . Attends Archivist Meetings: Not on file  . Marital Status: Not on file  Intimate Partner Violence:   . Fear of Current or Ex-Partner: Not on file  . Emotionally Abused: Not on file  . Physically Abused: Not on file  .  Sexually Abused: Not on file    Functional Status Survey:    Family History  Problem Relation Age of Onset  . Diabetes Mother   . Heart disease Mother   . Heart attack Father   . Breast cancer Sister     Health Maintenance  Topic Date Due  . TETANUS/TDAP  04/24/1957  . DEXA SCAN  04/25/2003  . PNA vac Low Risk Adult (1 of 2 - PCV13) 04/25/2003  . INFLUENZA VACCINE  Completed    No Known Allergies  Outpatient Encounter Medications as of 04/08/2019  Medication Sig  . aspirin 81 MG tablet Take 81 mg by mouth daily.  Marland Kitchen atorvastatin (LIPITOR) 20 MG tablet Take 20 mg daily at 6 PM by mouth.   . clopidogrel (PLAVIX) 75 MG tablet Take 75 mg by mouth daily.  . Coenzyme Q10 (COQ-10 PO) Take 1 tablet daily by mouth.  . desmopressin (DDAVP) 0.2 MG tablet Take 0.2 mg by mouth daily.  . diclofenac sodium (VOLTAREN) 1 % GEL Apply 4 g topically 4 (four) times daily as needed (pain).   Marland Kitchen estradiol (ESTRACE) 0.1 MG/GM vaginal cream Place 1 Applicatorful vaginally 3 (three) times a week.   . gabapentin (NEURONTIN) 100 MG capsule TAKE (1) CAPSULE THREE TIMES DAILY.  Marland Kitchen losartan (COZAAR) 25 MG tablet Take 25 mg by mouth daily.   . mirabegron ER (MYRBETRIQ) 50 MG TB24 tablet Take 50 mg by mouth daily.   . nitroGLYCERIN (NITROSTAT) 0.4 MG SL tablet Place 1 tablet (0.4 mg total) under the tongue every 5 (five) minutes as needed for chest pain.  Marland Kitchen omeprazole (PRILOSEC) 20 MG capsule Take 20 mg by mouth daily.  . sertraline (ZOLOFT) 50 MG tablet Take 75 mg by mouth every evening.   . TURMERIC PO Take 1 tablet by mouth daily.   . Wheat Dextrin (BENEFIBER DRINK MIX PO) Take 15 mLs daily by mouth.  . [DISCONTINUED] amLODipine (NORVASC) 2.5 MG tablet Take 2.5 mg by mouth daily.  . [DISCONTINUED] amoxicillin (AMOXIL) 500 MG capsule   . [DISCONTINUED] Co-Enzyme Q-10 30 MG CAPS Take by mouth.  . [DISCONTINUED] collagenase (SANTYL) ointment Apply 1 application topically daily. To left ankle daily to twice daily  . [DISCONTINUED] doxycycline (VIBRAMYCIN) 100 MG capsule Take 100 mg by mouth 2 (two) times daily.  . [DISCONTINUED] ferrous sulfate 325 (65 FE) MG tablet Take 325 mg daily with breakfast by mouth.  . [DISCONTINUED] traMADol (ULTRAM) 50 MG tablet    No facility-administered encounter medications on file as of 04/08/2019.    Review of Systems  Constitutional: Negative for activity change, appetite change and fatigue.  HENT: Negative for dental problem, hearing loss and trouble swallowing.        Aphasia  Eyes: Negative for photophobia and visual disturbance.  Respiratory: Negative for cough, shortness of breath and wheezing.   Cardiovascular: Negative for chest pain and leg swelling.  Gastrointestinal: Negative for abdominal pain, constipation, diarrhea and nausea.  Genitourinary: Positive for frequency and urgency.       Overactive bladder and incontinence   Musculoskeletal:       Right sided shoulder and knee pain  Skin: Negative.   Neurological: Positive for weakness. Negative for dizziness and headaches.       Right sided weakness  Psychiatric/Behavioral: Negative for dysphoric mood and sleep disturbance.  The patient is not nervous/anxious.     Vitals:   04/08/19 1327  BP: 100/70  Pulse: 66  Temp: 97.7 F (36.5 C)  TempSrc:  Oral  SpO2: 97%  Weight: 152 lb (68.9 kg)  Height: 5\' 6"  (1.676 m)   Body mass index is 24.53 kg/m. Physical Exam Vitals reviewed.  Constitutional:      General: She is not in acute distress.    Appearance: Normal appearance. She is normal weight.  HENT:     Head: Normocephalic.  Cardiovascular:     Rate and Rhythm: Normal rate and regular rhythm.     Pulses: Normal pulses.     Heart sounds: Normal heart sounds. No murmur.  Pulmonary:     Effort: Pulmonary effort is normal. No respiratory distress.     Breath sounds: Normal breath sounds. No wheezing.  Abdominal:     General: Abdomen is flat. Bowel sounds are normal.     Palpations: Abdomen is soft.  Musculoskeletal:     Right shoulder: Tenderness present. Decreased strength.     Cervical back: Normal range of motion. Tenderness present. Pain with movement present.     Thoracic back: Tenderness present.     Lumbar back: Tenderness present.     Right lower leg: No edema.     Left lower leg: No edema.  Skin:    General: Skin is warm and dry.     Capillary Refill: Capillary refill takes less than 2 seconds.  Neurological:     Mental Status: She is alert.     Sensory: Sensation is intact.     Motor: Weakness present.     Comments: Aphasia during examination. Able to shrug right shoulder, cannot move right arm past elbow to hand.   Psychiatric:        Mood and Affect: Mood normal.        Behavior: Behavior normal.        Thought Content: Thought content normal.        Judgment: Judgment normal.     Labs reviewed: Basic Metabolic Panel: No results for input(s): NA, K, CL, CO2, GLUCOSE, BUN, CREATININE, CALCIUM, MG, PHOS in the last 8760 hours. Liver Function Tests: No results for input(s): AST, ALT, ALKPHOS, BILITOT, PROT, ALBUMIN in the last 8760 hours. No results for input(s): LIPASE,  AMYLASE in the last 8760 hours. No results for input(s): AMMONIA in the last 8760 hours. CBC: No results for input(s): WBC, NEUTROABS, HGB, HCT, MCV, PLT in the last 8760 hours. Cardiac Enzymes: No results for input(s): CKTOTAL, CKMB, CKMBINDEX, TROPONINI in the last 8760 hours. BNP: Invalid input(s): POCBNP Lab Results  Component Value Date   HGBA1C 5.8 (H) 12/16/2013   No results found for: TSH Lab Results  Component Value Date   VITAMINB12 254 09/04/2015   Lab Results  Component Value Date   FOLATE >23.3 09/04/2015   Lab Results  Component Value Date   IRON 31 (L) 09/04/2015   FERRITIN 7.3 (L) 09/04/2015    Imaging and Procedures obtained prior to SNF admission: CT ABDOMEN PELVIS W WO CONTRAST  Result Date: 03/16/2019 CLINICAL DATA:  Microscopic hematuria. Nephrolithiasis. EXAM: CT ABDOMEN AND PELVIS WITHOUT AND WITH CONTRAST TECHNIQUE: Multidetector CT imaging of the abdomen and pelvis was performed following the standard protocol before and following the bolus administration of intravenous contrast. CONTRAST:  155mL ISOVUE-300 IOPAMIDOL (ISOVUE-300) INJECTION 61% COMPARISON:  None. FINDINGS: Lower chest: Lung bases are clear. Hepatobiliary: No focal hepatic lesion. Several small gallstones in the gallbladder. Pancreas: Pancreas is normal. No ductal dilatation. No pancreatic inflammation. Spleen: Normal spleen Adrenals/urinary tract: Adrenal glands normal. Bulky calcifications in the mid to lower pole  RIGHT kidney measures 13 mm in length and 6 mm in width. No ureterolithiasis. No obstructive uropathy. No bladder calculi, enhancing bladder lesions, or filling defect within the bladder. Stomach/Bowel: Stomach, small bowel, appendix, and cecum are normal. Multiple diverticula of the descending colon and sigmoid colon without acute inflammation. Vascular/Lymphatic: Abdominal aorta is normal caliber with atherosclerotic calcification. There is no retroperitoneal or periportal  lymphadenopathy. No pelvic lymphadenopathy. Reproductive: Post hysterectomy Other: No free fluid. Musculoskeletal: No aggressive osseous lesion. IMPRESSION: 1. Elongated calculus within the RIGHT kidney. No ureterolithiasis or obstructive uropathy. 2. No bladder stones or filling defects in the bladder which does not excluded a bladder lesion. 3. LEFT colon diverticulosis without diverticulitis. 4. Cholelithiasis. 5. Aortic Atherosclerosis (ICD10-I70.0). Electronically Signed   By: Suzy Bouchard M.D.   On: 03/16/2019 13:45    Assessment/Plan 1. Spastic hemiplegia of right dominant side as late effect of cerebral infarction ([place]) - suspect right sided pain may be associated with stroke - Wean zoloft by 25 mg per week for three weeks - Start Cymbalta 20 mg daily after weaned off zoloft - continue gabapentin 100 mg three times daily - may use tylenol PRN for increased pain  2. Aphasia as late effect of stroke - she has not reported any issues with swallowing and eats 3 meals daily - she is at risk for aspiration - educate patient and family on signs of aspiration  3. Degenerative cervical spinal stenosis - suspect right sided pain could stem from cervical compression - will wean off zoloft and start cymbalta   Family/ staff Communication:   Labs/tests ordered: none   Follow up: 8 Week follow up

## 2019-04-08 NOTE — Patient Instructions (Signed)
Wean zoloft by 25mg  per week x three weeks, then start cymbalta 20mg  daily.

## 2019-04-13 ENCOUNTER — Encounter: Payer: Self-pay | Admitting: Internal Medicine

## 2019-04-20 ENCOUNTER — Other Ambulatory Visit: Payer: Self-pay

## 2019-04-20 ENCOUNTER — Encounter: Payer: Self-pay | Admitting: Physical Medicine and Rehabilitation

## 2019-04-20 ENCOUNTER — Encounter
Payer: Medicare Other | Attending: Physical Medicine and Rehabilitation | Admitting: Physical Medicine and Rehabilitation

## 2019-04-20 VITALS — BP 126/70 | HR 67 | Temp 97.5°F | Ht 66.0 in | Wt 152.0 lb

## 2019-04-20 DIAGNOSIS — G8110 Spastic hemiplegia affecting unspecified side: Secondary | ICD-10-CM | POA: Diagnosis not present

## 2019-04-20 MED ORDER — GABAPENTIN 300 MG PO CAPS: 300.0000 mg | ORAL_CAPSULE | Freq: Three times a day (TID) | ORAL | 1 refills | Status: DC

## 2019-04-20 NOTE — Progress Notes (Signed)
Subjective:    Patient ID: Kristin [name], female    DOB: March 01, 1941, 78 y.o.   MRN: [id number]  HPI I am meeting with the patient today regarding left MCA infarct and associated deficits.  Her daughter reports that she has not been moving as much as she had been previously prior to COVID due to some of the restrictions going on.  She is not doing as many classes etc.  She has been getting up some.  However, daughter has seen some decline in her functional mobility.  Pain wise she is doing fairly well.  Still has some issues with the left shoulder as well as pain in the right arm and right leg..  The patient demonstrated some of the stretches she is doing on her own.  She remains on gabapentin for neuropathic pain as well as Voltaren gel and occasionally tramadol. Her Gabapentin dose is very low--100mg  TID. This does not make her drowsy.  Her left heel wound has resolved. Mood has been good.  She is not eating as well as usual since she has less communal dining events at her center.  She is not involved in any physical therapy but tries to stretch out her hand herself. She is able to ambulate within her room and use the rest room. She has a lot of joy in her life in living in the center. She loves listening to music. Her husband who lives in the center was unfortunately admitted to the hospital yesterday for fevers due to his bladder cancer. His daughter felt his prognosis is not good.  [name] has continued outpatient follow-up with urology and continues on DDAVP and Myrbetriq for urinary incontinence. Her sleep continues to be disturbed as a result of this.   Pain Inventory Average Pain 5 Pain Right Now 6 My pain is constant, sharp, dull and aching  In the last 24 hours, has pain interfered with the following? General activity 6 Relation with others 6 Enjoyment of life 6 What TIME of day is your pain at its worst? night Sleep (in general) Poor  Pain is worse with: walking, sitting and  standing Pain improves with: medication Relief from Meds: 2  Mobility walk with assistance use a walker ability to climb steps?  no do you drive?  no transfers alone Do you have any goals in this area?  no  Function disabled: date disabled . I need assistance with the following:  meal prep, household duties and shopping  Neuro/Psych bladder control problems trouble walking  Prior Studies Any changes since last visit?  no  Physicians involved in your care Any changes since last visit?  no   Family History  Problem Relation Age of Onset  . Diabetes Mother   . Heart disease Mother   . Heart attack Father   . Breast cancer Sister    Social History   Socioeconomic History  . Marital status: Married    Spouse name: Not on file  . Number of children: 2  . Years of education: Not on file  . Highest education level: Not on file  Occupational History  . Occupation: retired Education officer, museum  Tobacco Use  . Smoking status: Never Smoker  . Smokeless tobacco: Never Used  Substance and Sexual Activity  . Alcohol use: Yes    Alcohol/week: 0.0 standard drinks    Comment: glass of wine a day  . Drug use: No  . Sexual activity: Never  Other Topics Concern  . Not on file  Social History Narrative  . Not on file   Social Determinants of Health   Financial Resource Strain:   . Difficulty of Paying Living Expenses: Not on file  Food Insecurity:   . Worried About Charity fundraiser in the Last Year: Not on file  . Ran Out of Food in the Last Year: Not on file  Transportation Needs:   . Lack of Transportation (Medical): Not on file  . Lack of Transportation (Non-Medical): Not on file  Physical Activity:   . Days of Exercise per Week: Not on file  . Minutes of Exercise per Session: Not on file  Stress:   . Feeling of Stress : Not on file  Social Connections:   . Frequency of Communication with Friends and Family: Not on file  . Frequency of Social Gatherings with  Friends and Family: Not on file  . Attends Religious Services: Not on file  . Active Member of Clubs or Organizations: Not on file  . Attends Archivist Meetings: Not on file  . Marital Status: Not on file   Past Surgical History:  Procedure Laterality Date  . ABDOMINAL HYSTERECTOMY    . CAROTID STENT    . cataracts    . COLONOSCOPY N/A 10/05/2015   Procedure: COLONOSCOPY;  Surgeon: Manus Gunning, MD;  Location: [facility];  Service: Gastroenterology;  Laterality: N/A;  . CORONARY ARTERY BYPASS GRAFT N/A 12/17/2013   Procedure: CORONARY ARTERY BYPASS GRAFTING (CABG);  Surgeon: Gaye Pollack, MD;  Location: [place];  Service: Open Heart Surgery;  Laterality: N/A;  Times 2 using left internal mammary artery and endoscopically harvested right saphenous vein  . ESOPHAGOGASTRODUODENOSCOPY    . ESOPHAGOGASTRODUODENOSCOPY N/A 10/05/2015   Procedure: ESOPHAGOGASTRODUODENOSCOPY (EGD);  Surgeon: Manus Gunning, MD;  Location: [facility];  Service: Gastroenterology;  Laterality: N/A;  . INTRAOPERATIVE TRANSESOPHAGEAL ECHOCARDIOGRAM N/A 12/17/2013   Procedure: INTRAOPERATIVE TRANSESOPHAGEAL ECHOCARDIOGRAM;  Surgeon: Gaye Pollack, MD;  Location: [place] OR;  Service: Open Heart Surgery;  Laterality: N/A;  . lasix    . LEFT HEART CATHETERIZATION WITH CORONARY ANGIOGRAM N/A 12/13/2013   Procedure: LEFT HEART CATHETERIZATION WITH CORONARY ANGIOGRAM;  Surgeon: Jettie Booze, MD;  Location: [facility] CATH LAB;  Service: Cardiovascular;  Laterality: N/A;  . retina injection     Past Medical History:  Diagnosis Date  . Arthritis    hands  . CAD (coronary artery disease)    a. 2010: stents pRCA and dRCA. b. LHC 12/13/13: Minimal distal LM dz. LAD 95% ostial w/ L-->R collat. LCx mild dz. RCA 95% mid-dRCA. Patent stents proximal and distal RCA. EF not assessed, Nl by nuclear study.     . Central retinal vein occlusion, right eye   . Chronic kidney disease    hx kidney stone  .  Depression   . Diplopia   . Ear pain    right  . Eosinophilic esophagitis   . Essential hypertension   . GERD (gastroesophageal reflux disease)   . H/O: whooping cough   . Hyperlipidemia with target LDL less than 70   . Incontinence    urine  . Stroke ([place]) 2001   reesultant Rt spastic hemplegia and aphasia    BP 126/70   Pulse 67   Temp (!) 97.5 F (36.4 C)   Ht 5\' 6"  (1.676 m)   Wt 152 lb (68.9 kg)   SpO2 94%   BMI 24.53 kg/m   Opioid Risk Score:   Fall Risk Score:  `  1  Depression screen PHQ 2/9  Depression screen [facility] 2/9 04/08/2019 08/26/2017 11/11/2014  Decreased Interest 0 0 0  Down, Depressed, Hopeless 0 0 0  PHQ - 2 Score 0 0 0    Review of Systems  HENT: Negative.   Eyes: Negative.   Respiratory: Negative.   Cardiovascular: Negative.   Gastrointestinal: Negative.   Genitourinary:       Incontinence  Musculoskeletal: Positive for arthralgias and gait problem.  Skin: Negative.   Neurological: Positive for speech difficulty and weakness.  Psychiatric/Behavioral: Negative.   All other systems reviewed and are negative.      Objective:   Physical Exam General: No acute distress HEENT: EOMI, oral membranes moist Cards: reg rate  Chest: normal effort Abdomen: Soft, NT, ND Skin: dry, intact Extremities: no edema Neuro:Patient communicates through short phrases and words as well as head nods. Primarily expressive aphasia; she understands very well and follows commands well. Right upper extremity with minimal voluntary movement. She has 2+ to 3 out of 5 hip flexor and knee extensor strength right lower extremity. Right ankle in splint with minimal active movement. Left upper extremity and left lower extremity grossly 4+ to 5 out of 5. Musculoskeletal:ongoing sublux. Minimal pain with right knee range of motion today, passive or active.. Right hamstrings tight.  Psych:Pt's affect is pleasant       Assessment & Plan:  1. Hx of left MCA infarct  with spastic right hemiparesis, expressive greater than receptive aphasia  2. .Primary osteoarthritis right knee 4. Insomnia, anxiety  5. Constipation 6. Right shoulder pain related to chronic subluxation, mild adhesive capsulitis, chronic spasticity  7. OSA.  8. Left rotator cuff tendonitis/bursitis;biciptial tendonitis--generally improved. 9. Right rotator cuff tendonitis/right shoulder sublux 10.Right sided neuropathic pain 11. NON-healing left medial ankle wound.    Plan:  1. can continue daily ambulation and exercise at facility.  Focus should be on mechanics and strengthening of the lower extremities, particularly the right side.  Also needs to improve hamstring flexibility. Provided with PT script for daily ROM exercises as well as cervical postural correction and shoulder ROM exercises.  2.Increased Gabapentin dose to 300mg  TID for better pain control.  3. Continue right shoulder harness for support.  4. I will see her back in6 months.

## 2019-04-26 ENCOUNTER — Other Ambulatory Visit: Payer: Self-pay | Admitting: Internal Medicine

## 2019-04-26 DIAGNOSIS — M792 Neuralgia and neuritis, unspecified: Secondary | ICD-10-CM

## 2019-04-26 MED ORDER — DULOXETINE HCL 20 MG PO CPEP: 20.0000 mg | ORAL_CAPSULE | Freq: Every day | ORAL | 1 refills | Status: DC

## 2019-05-25 ENCOUNTER — Ambulatory Visit: Payer: Medicare Other | Admitting: Podiatry

## 2019-06-01 ENCOUNTER — Ambulatory Visit: Payer: Medicare PPO | Admitting: Podiatry

## 2019-06-01 ENCOUNTER — Other Ambulatory Visit: Payer: Self-pay

## 2019-06-01 ENCOUNTER — Encounter: Payer: Self-pay | Admitting: Podiatry

## 2019-06-01 DIAGNOSIS — M79675 Pain in left toe(s): Secondary | ICD-10-CM

## 2019-06-01 DIAGNOSIS — B351 Tinea unguium: Secondary | ICD-10-CM

## 2019-06-01 DIAGNOSIS — M79674 Pain in right toe(s): Secondary | ICD-10-CM

## 2019-06-01 DIAGNOSIS — L84 Corns and callosities: Secondary | ICD-10-CM

## 2019-06-01 DIAGNOSIS — G5791 Unspecified mononeuropathy of right lower limb: Secondary | ICD-10-CM

## 2019-06-01 NOTE — Patient Instructions (Signed)

## 2019-06-03 ENCOUNTER — Ambulatory Visit (INDEPENDENT_AMBULATORY_CARE_PROVIDER_SITE_OTHER): Payer: Medicare PPO | Admitting: Internal Medicine

## 2019-06-03 ENCOUNTER — Other Ambulatory Visit: Payer: Self-pay

## 2019-06-03 ENCOUNTER — Encounter: Payer: Self-pay | Admitting: Internal Medicine

## 2019-06-03 VITALS — BP 110/58 | HR 65 | Temp 97.9°F | Ht 66.0 in | Wt 166.0 lb

## 2019-06-03 DIAGNOSIS — M792 Neuralgia and neuritis, unspecified: Secondary | ICD-10-CM | POA: Diagnosis not present

## 2019-06-03 DIAGNOSIS — G8110 Spastic hemiplegia affecting unspecified side: Secondary | ICD-10-CM | POA: Diagnosis not present

## 2019-06-03 DIAGNOSIS — K219 Gastro-esophageal reflux disease without esophagitis: Secondary | ICD-10-CM

## 2019-06-03 DIAGNOSIS — I69320 Aphasia following cerebral infarction: Secondary | ICD-10-CM

## 2019-06-03 DIAGNOSIS — I10 Essential (primary) hypertension: Secondary | ICD-10-CM

## 2019-06-03 DIAGNOSIS — M4802 Spinal stenosis, cervical region: Secondary | ICD-10-CM | POA: Diagnosis not present

## 2019-06-03 NOTE — Progress Notes (Signed)
Location:  [facility] clinic Provider:  Haden Suder L. Mariea Clonts, D.O., C.M.D.  Code Status: Has been full code---need to discuss how she feels about CPR Goals of Care:  Advanced Directives 06/03/2019  Does Patient Have a Medical Advance Directive? Yes  Type of Advance Directive [place]  Does patient want to make changes to medical advance directive? No - Patient declined  Copy of [place] in Chart? -  Would patient like information on creating a medical advance directive? -  Pre-existing out of facility DNR order (yellow form or pink MOST form) -     Chief Complaint  Patient presents with  . Medical Management of Chronic Issues    8 week follow up for rt side pain. Neoma Laming daughter is  with her     HPI: Patient is a 79 y.o. female seen today for follow-up on right side pain.  Her stepdaughter, [name], is with her.    Pain is better after some PT and going up on gabapentin with PMR.  There was music at PT last time and she loves music.  He's working her tight trapezius muscles.  Transition to duloxetine has gone fine from zoloft.    covid vaccine #1 so far.  Second is next week from [facility] at [address].    No reflux symptoms recently.    She is feeling down a bit about her husband being ill.  Mood has been ok though overall per [name].  Her daughter from [place] came and a granddaughter coming next week--she's excited about those things.    Past Medical History:  Diagnosis Date  . Arthritis    hands  . CAD (coronary artery disease)    a. 2010: stents pRCA and dRCA. b. LHC 12/13/13: Minimal distal LM dz. LAD 95% ostial w/ L-->R collat. LCx mild dz. RCA 95% mid-dRCA. Patent stents proximal and distal RCA. EF not assessed, Nl by nuclear study.     . Central retinal vein occlusion, right eye   . Chronic kidney disease    hx kidney stone  . Depression   . Diplopia   . Ear pain    right  . Eosinophilic esophagitis   . Essential hypertension   .  GERD (gastroesophageal reflux disease)   . H/O: whooping cough   . Hyperlipidemia with target LDL less than 70   . Incontinence    urine  . Stroke [facility]) 2001   reesultant Rt spastic hemplegia and aphasia     Past Surgical History:  Procedure Laterality Date  . ABDOMINAL HYSTERECTOMY    . CAROTID STENT    . cataracts    . COLONOSCOPY N/A 10/05/2015   Procedure: COLONOSCOPY;  Surgeon: Manus Gunning, MD;  Location: [facility];  Service: Gastroenterology;  Laterality: N/A;  . CORONARY ARTERY BYPASS GRAFT N/A 12/17/2013   Procedure: CORONARY ARTERY BYPASS GRAFTING (CABG);  Surgeon: Gaye Pollack, MD;  Location: [place] [facility];  Service: Open Heart Surgery;  Laterality: N/A;  Times 2 using left internal mammary artery and endoscopically harvested right saphenous vein  . ESOPHAGOGASTRODUODENOSCOPY    . ESOPHAGOGASTRODUODENOSCOPY N/A 10/05/2015   Procedure: ESOPHAGOGASTRODUODENOSCOPY (EGD);  Surgeon: Manus Gunning, MD;  Location: [facility];  Service: Gastroenterology;  Laterality: N/A;  . INTRAOPERATIVE TRANSESOPHAGEAL ECHOCARDIOGRAM N/A 12/17/2013   Procedure: INTRAOPERATIVE TRANSESOPHAGEAL ECHOCARDIOGRAM;  Surgeon: Gaye Pollack, MD;  Location: [facility] OR;  Service: Open Heart Surgery;  Laterality: N/A;  . lasix    . LEFT HEART CATHETERIZATION WITH CORONARY  ANGIOGRAM N/A 12/13/2013   Procedure: LEFT HEART CATHETERIZATION WITH CORONARY ANGIOGRAM;  Surgeon: Jettie Booze, MD;  Location: [facility] CATH LAB;  Service: Cardiovascular;  Laterality: N/A;  . retina injection      No Known Allergies  Outpatient Encounter Medications as of 06/03/2019  Medication Sig  . aspirin 81 MG tablet Take 81 mg by mouth daily.  Marland Kitchen atorvastatin (LIPITOR) 20 MG tablet Take 20 mg daily at 6 PM by mouth.   . clopidogrel (PLAVIX) 75 MG tablet Take 75 mg by mouth daily.  . Coenzyme Q10 (COQ-10 PO) Take 1 tablet daily by mouth.  . desmopressin (DDAVP) 0.2 MG tablet Take 0.2 mg by mouth daily.  . diclofenac  sodium (VOLTAREN) 1 % GEL Apply 4 g topically 4 (four) times daily as needed (pain).   . DULoxetine (CYMBALTA) 20 MG capsule Take 1 capsule (20 mg total) by mouth daily.  Marland Kitchen estradiol (ESTRACE) 0.1 MG/GM vaginal cream Place 1 Applicatorful vaginally 3 (three) times a week.   . gabapentin (NEURONTIN) 300 MG capsule Take 1 capsule (300 mg total) by mouth 3 (three) times daily.  Marland Kitchen losartan (COZAAR) 25 MG tablet Take 25 mg by mouth daily.  . mirabegron ER (MYRBETRIQ) 50 MG TB24 tablet Take 50 mg by mouth daily.   . nitroGLYCERIN (NITROSTAT) 0.4 MG SL tablet Place 1 tablet (0.4 mg total) under the tongue every 5 (five) minutes as needed for chest pain.  Marland Kitchen omeprazole (PRILOSEC) 20 MG capsule Take 20 mg by mouth daily.  . TURMERIC PO Take 1 tablet by mouth daily.   . Wheat Dextrin (BENEFIBER DRINK MIX PO) Take 15 mLs daily by mouth.  . [DISCONTINUED] sertraline (ZOLOFT) 50 MG tablet Take 75 mg by mouth every evening.    No facility-administered encounter medications on file as of 06/03/2019.    Review of Systems:  Review of Systems  Constitutional: Negative for chills, fever and malaise/fatigue.  HENT: Negative for congestion.   Eyes: Negative for pain.  Respiratory: Negative for cough and shortness of breath.   Cardiovascular: Positive for leg swelling. Negative for chest pain and palpitations.       Right swells  Gastrointestinal: Negative for abdominal pain and heartburn.  Genitourinary: Negative for dysuria.  Musculoskeletal: Positive for joint pain and myalgias. Negative for falls.  Skin: Negative for itching and rash.  Neurological: Positive for sensory change, speech change and focal weakness. Negative for dizziness and loss of consciousness.       All from stroke many years ago  Endo/Heme/Allergies: Bruises/bleeds easily.  Psychiatric/Behavioral: Negative for depression. The patient is not nervous/anxious and does not have insomnia.        Spirits ok    Health Maintenance  Topic  Date Due  . DEXA SCAN  04/25/2003  . PNA vac Low Risk Adult (2 of 2 - PCV13) 05/12/2014  . TETANUS/TDAP  05/13/2023  . INFLUENZA VACCINE  Completed    Physical Exam: Vitals:   06/03/19 1505  BP: (!) 110/58  Pulse: 65  Temp: 97.9 F (36.6 C)  TempSrc: Temporal  SpO2: 99%  Weight: 166 lb (75.3 kg)  Height: 5\' 6"  (1.676 m)   Body mass index is 26.79 kg/m. Physical Exam Vitals reviewed.  Constitutional:      General: She is not in acute distress.    Appearance: Normal appearance. She is not ill-appearing or toxic-appearing.  HENT:     Head: Normocephalic and atraumatic.  Cardiovascular:     Rate and Rhythm: Normal rate  and regular rhythm.     Pulses: Normal pulses.  Pulmonary:     Effort: Pulmonary effort is normal.     Breath sounds: Normal breath sounds.  Abdominal:     Palpations: Abdomen is soft.  Musculoskeletal:        General: Tenderness present.     Right lower leg: Edema present.     Comments: Chronic tenderness and spasticity of right side, right trapezius, right hemiparesis  Skin:    General: Skin is warm and dry.  Neurological:     Mental Status: She is alert and oriented to person, place, and time. Mental status is at baseline.     Cranial Nerves: Cranial nerve deficit present.     Sensory: Sensory deficit present.     Motor: Weakness present.     Coordination: Coordination abnormal.     Gait: Gait abnormal.     Comments: RLE foot drop; right spastic hemiparesis; expressive aphasia  Psychiatric:        Mood and Affect: Mood normal.        Behavior: Behavior normal.     Labs reviewed: Basic Metabolic Panel: Recent Labs    10/29/18 0000  NA 141  K 4.1  CL 107  CO2 24*  BUN 21  CREATININE 0.7  TSH 1.95   Liver Function Tests: Recent Labs    10/29/18 0000  AST 14  ALT 10  ALKPHOS 80  ALBUMIN 4.1   No results for input(s): LIPASE, AMYLASE in the last 8760 hours. No results for input(s): AMMONIA in the last 8760 hours. CBC: Recent  Labs    10/29/18 0000  WBC 6.0  HGB 15.1  HCT 47*  PLT 224   Lipid Panel: No results for input(s): CHOL, HDL, LDLCALC, TRIG, CHOLHDL, LDLDIRECT in the last 8760 hours. Lab Results  Component Value Date   HGBA1C 5.4 06/02/2018    Assessment/Plan 1. Spastic hemiplegia affecting dominant side (HCC) -continue increased dose of gabapentin, PT for right side pain especially right shoulder -also uses voltaren gel for sore areas  2. Neuropathic pain -cont increased gabapentin, also cymbalta which she's tolerated well  3. Degenerative cervical spinal stenosis -causing some of her discomfort, cont gabapentin and therapy  4. Aphasia as late effect of stroke -longstanding, communicates some with her hands and will say a word or two occasionally--her stepdaughter helps to translate  5. Essential hypertension -bp is well controlled with current mgt  6. Gastroesophageal reflux disease, unspecified whether esophagitis present -no recent symptoms, managing on prilosec well  Labs/tests ordered:  Will plan on cbc, cmp, flp, hba1c at CPE in 4 mos Next appt:  10/21/2019 CPE, come fasting for labs  Stephany Poorman L. Kyndahl Jablon, D.O. [facility] [address], [place] [address] Cell Phone (Mon-Fri 8am-5pm):  [phone] On Call:  [phone] & follow prompts after 5pm & weekends Office Phone:  [phone] Office Fax:  [phone]

## 2019-06-04 NOTE — Progress Notes (Signed)
Subjective: Kristin [name] presents today for follow up of corn(s) b/l feet and painful mycotic toenails b/l that are difficult to trim. Pain interferes with ambulation. Aggravating factors include wearing enclosed shoe gear. Pain is relieved with periodic professional debridement..   No Known Allergies   Objective: There were no vitals filed for this visit.  Vascular Examination:  Capillary refill time to digits immediate b/l, palpable DP pulses b/l, palpable PT pulses b/l, pedal hair absent b/l and skin temperature gradient within normal limits b/l  Dermatological Examination: Pedal skin with normal turgor, texture and tone bilaterally, no open wounds bilaterally, toenails 1-5 b/l elongated, dystrophic, thickened, crumbly with subungual debris and hyperkeratotic lesion(s) distal tip right 3rd digit.  No erythema, no edema, no drainage, no flocculence  Musculoskeletal: Normal muscle strength 5/5 to all lower extremity muscle groups bilaterally, no pain crepitus or joint limitation noted with ROM b/l and clawtoes 1-5 right foot. Wearing AFO RLE.  Neurological: Protective sensation intact 5/5 LLE; decreased RLE  Assessment: 1. Pain due to onychomycosis of toenails of both feet   2. Corns   3. Neuropathy of right lower extremity    Plan: -Toenails 1-5 b/l were debrided in length and girth without iatrogenic bleeding. -corn(s) debrided right 3rd digit without complication or incident. Total number debrided=1. -Patient to continue soft, supportive shoe gear daily. -Patient to report any pedal injuries to medical professional immediately. -Patient/POA to call should there be question/concern in the interim.  Return in about 3 months (around 08/29/2019) for nail and callus trim/ Plavix.

## 2019-06-17 ENCOUNTER — Encounter: Payer: Self-pay | Admitting: Internal Medicine

## 2019-06-17 DIAGNOSIS — M792 Neuralgia and neuritis, unspecified: Secondary | ICD-10-CM

## 2019-06-17 MED ORDER — DULOXETINE HCL 20 MG PO CPEP: 20.0000 mg | ORAL_CAPSULE | Freq: Every day | ORAL | 1 refills | Status: DC

## 2019-07-02 ENCOUNTER — Other Ambulatory Visit: Payer: Self-pay | Admitting: Internal Medicine

## 2019-07-02 NOTE — Telephone Encounter (Signed)
Ok to fill not on med list

## 2019-07-02 NOTE — Telephone Encounter (Signed)
No, please refuse.  She is now on cymbalta 20mg  instead.

## 2019-07-05 ENCOUNTER — Other Ambulatory Visit: Payer: Self-pay | Admitting: Internal Medicine

## 2019-07-05 NOTE — Telephone Encounter (Signed)
Spoke with daughter and pt is no longer taking this.

## 2019-07-05 NOTE — Telephone Encounter (Signed)
Medication was not on med list

## 2019-07-07 ENCOUNTER — Inpatient Hospital Stay (HOSPITAL_COMMUNITY)
Admission: EM | Admit: 2019-07-07 | Discharge: 2019-07-08 | DRG: 690 | Disposition: A | Discharge status: Home Health | Payer: Medicare PPO | Attending: Internal Medicine | Admitting: Internal Medicine

## 2019-07-07 ENCOUNTER — Encounter (HOSPITAL_COMMUNITY): Payer: Self-pay

## 2019-07-07 ENCOUNTER — Emergency Department (HOSPITAL_COMMUNITY): Payer: Medicare PPO

## 2019-07-07 ENCOUNTER — Inpatient Hospital Stay (HOSPITAL_COMMUNITY): Payer: Medicare PPO

## 2019-07-07 ENCOUNTER — Other Ambulatory Visit: Payer: Self-pay

## 2019-07-07 DIAGNOSIS — Z79899 Other long term (current) drug therapy: Secondary | ICD-10-CM

## 2019-07-07 DIAGNOSIS — I350 Nonrheumatic aortic (valve) stenosis: Secondary | ICD-10-CM | POA: Diagnosis not present

## 2019-07-07 DIAGNOSIS — Z20822 Contact with and (suspected) exposure to covid-19: Secondary | ICD-10-CM | POA: Diagnosis present

## 2019-07-07 DIAGNOSIS — N189 Chronic kidney disease, unspecified: Secondary | ICD-10-CM | POA: Diagnosis present

## 2019-07-07 DIAGNOSIS — E785 Hyperlipidemia, unspecified: Secondary | ICD-10-CM | POA: Diagnosis present

## 2019-07-07 DIAGNOSIS — G8110 Spastic hemiplegia affecting unspecified side: Secondary | ICD-10-CM | POA: Diagnosis not present

## 2019-07-07 DIAGNOSIS — Z7982 Long term (current) use of aspirin: Secondary | ICD-10-CM

## 2019-07-07 DIAGNOSIS — I69351 Hemiplegia and hemiparesis following cerebral infarction affecting right dominant side: Secondary | ICD-10-CM

## 2019-07-07 DIAGNOSIS — Z833 Family history of diabetes mellitus: Secondary | ICD-10-CM

## 2019-07-07 DIAGNOSIS — Z803 Family history of malignant neoplasm of breast: Secondary | ICD-10-CM | POA: Diagnosis not present

## 2019-07-07 DIAGNOSIS — Z87440 Personal history of urinary (tract) infections: Secondary | ICD-10-CM | POA: Diagnosis not present

## 2019-07-07 DIAGNOSIS — Z951 Presence of aortocoronary bypass graft: Secondary | ICD-10-CM

## 2019-07-07 DIAGNOSIS — Z9861 Coronary angioplasty status: Secondary | ICD-10-CM

## 2019-07-07 DIAGNOSIS — I10 Essential (primary) hypertension: Secondary | ICD-10-CM

## 2019-07-07 DIAGNOSIS — Z8249 Family history of ischemic heart disease and other diseases of the circulatory system: Secondary | ICD-10-CM

## 2019-07-07 DIAGNOSIS — M940 Chondrocostal junction syndrome [Tietze]: Secondary | ICD-10-CM | POA: Diagnosis present

## 2019-07-07 DIAGNOSIS — N200 Calculus of kidney: Secondary | ICD-10-CM | POA: Diagnosis present

## 2019-07-07 DIAGNOSIS — I129 Hypertensive chronic kidney disease with stage 1 through stage 4 chronic kidney disease, or unspecified chronic kidney disease: Secondary | ICD-10-CM | POA: Diagnosis present

## 2019-07-07 DIAGNOSIS — Z87442 Personal history of urinary calculi: Secondary | ICD-10-CM | POA: Diagnosis not present

## 2019-07-07 DIAGNOSIS — I69320 Aphasia following cerebral infarction: Secondary | ICD-10-CM | POA: Diagnosis not present

## 2019-07-07 DIAGNOSIS — R5081 Fever presenting with conditions classified elsewhere: Secondary | ICD-10-CM

## 2019-07-07 DIAGNOSIS — N390 Urinary tract infection, site not specified: Principal | ICD-10-CM | POA: Diagnosis present

## 2019-07-07 DIAGNOSIS — E876 Hypokalemia: Secondary | ICD-10-CM | POA: Diagnosis not present

## 2019-07-07 DIAGNOSIS — R509 Fever, unspecified: Secondary | ICD-10-CM

## 2019-07-07 DIAGNOSIS — Z90710 Acquired absence of both cervix and uterus: Secondary | ICD-10-CM

## 2019-07-07 DIAGNOSIS — I2510 Atherosclerotic heart disease of native coronary artery without angina pectoris: Secondary | ICD-10-CM | POA: Diagnosis present

## 2019-07-07 LAB — CBC WITH DIFFERENTIAL/PLATELET
Abs Immature Granulocytes: 0.04 10*3/uL (ref 0.00–0.07)
Basophils Absolute: 0 10*3/uL (ref 0.0–0.1)
Basophils Relative: 0 %
Eosinophils Absolute: 0.6 10*3/uL — ABNORMAL HIGH (ref 0.0–0.5)
Eosinophils Relative: 6 %
HCT: 46.9 % — ABNORMAL HIGH (ref 36.0–46.0)
Hemoglobin: 15 g/dL (ref 12.0–15.0)
Immature Granulocytes: 0 %
Lymphocytes Relative: 4 %
Lymphs Abs: 0.4 10*3/uL — ABNORMAL LOW (ref 0.7–4.0)
MCH: 29.9 pg (ref 26.0–34.0)
MCHC: 32 g/dL (ref 30.0–36.0)
MCV: 93.6 fL (ref 80.0–100.0)
Monocytes Absolute: 0.6 10*3/uL (ref 0.1–1.0)
Monocytes Relative: 6 %
Neutro Abs: 8.6 10*3/uL — ABNORMAL HIGH (ref 1.7–7.7)
Neutrophils Relative %: 84 %
Platelets: 201 10*3/uL (ref 150–400)
RBC: 5.01 MIL/uL (ref 3.87–5.11)
RDW: 12.3 % (ref 11.5–15.5)
WBC: 10.3 10*3/uL (ref 4.0–10.5)
nRBC: 0 % (ref 0.0–0.2)

## 2019-07-07 LAB — ECHOCARDIOGRAM COMPLETE
Height: 70 in
Weight: 2480 oz

## 2019-07-07 LAB — COMPREHENSIVE METABOLIC PANEL
ALT: 43 U/L (ref 0–44)
AST: 54 U/L — ABNORMAL HIGH (ref 15–41)
Albumin: 4 g/dL (ref 3.5–5.0)
Alkaline Phosphatase: 86 U/L (ref 38–126)
Anion gap: 9 (ref 5–15)
BUN: 19 mg/dL (ref 8–23)
CO2: 27 mmol/L (ref 22–32)
Calcium: 8.8 mg/dL — ABNORMAL LOW (ref 8.9–10.3)
Chloride: 101 mmol/L (ref 98–111)
Creatinine, Ser: 0.61 mg/dL (ref 0.44–1.00)
GFR calc Af Amer: >60 mL/min (ref >60)
GFR calc non Af Amer: >60 mL/min (ref >60)
Glucose, Bld: 143 mg/dL — ABNORMAL HIGH (ref 70–99)
Potassium: 4.7 mmol/L (ref 3.5–5.1)
Sodium: 137 mmol/L (ref 135–145)
Total Bilirubin: 1.6 mg/dL — ABNORMAL HIGH (ref 0.3–1.2)
Total Protein: 7.5 g/dL (ref 6.5–8.1)

## 2019-07-07 LAB — URINALYSIS, ROUTINE W REFLEX MICROSCOPIC
Bilirubin Urine: NEGATIVE
Glucose, UA: NEGATIVE mg/dL
Ketones, ur: 5 mg/dL — AB
Leukocytes,Ua: NEGATIVE
Nitrite: NEGATIVE
Protein, ur: 30 mg/dL — AB
RBC / HPF: >50 RBC/hpf — ABNORMAL HIGH (ref 0–5)
Specific Gravity, Urine: 1.021 (ref 1.005–1.030)
pH: 5 (ref 5.0–8.0)

## 2019-07-07 LAB — PROTIME-INR
INR: 1 (ref 0.8–1.2)
Prothrombin Time: 13.1 seconds (ref 11.4–15.2)

## 2019-07-07 LAB — APTT: aPTT: 34 seconds (ref 24–36)

## 2019-07-07 LAB — RESPIRATORY PANEL BY RT PCR (FLU A&B, COVID)
Influenza A by PCR: NEGATIVE
Influenza B by PCR: NEGATIVE
SARS Coronavirus 2 by RT PCR: NEGATIVE

## 2019-07-07 LAB — LIPASE, BLOOD: Lipase: 17 U/L (ref 11–51)

## 2019-07-07 LAB — LACTIC ACID, PLASMA: Lactic Acid, Venous: 1.4 mmol/L (ref 0.5–1.9)

## 2019-07-07 LAB — TROPONIN I (HIGH SENSITIVITY): Troponin I (High Sensitivity): 35 ng/L — ABNORMAL HIGH (ref <18)

## 2019-07-07 MED ORDER — SODIUM CHLORIDE 0.9 % IV BOLUS
30.0000 mL/kg | Freq: Once | INTRAVENOUS | Status: AC
  Administered 2019-07-07: 2250 mL via INTRAVENOUS

## 2019-07-07 MED ORDER — SODIUM CHLORIDE 0.45 % IV SOLN: INTRAVENOUS | Status: DC

## 2019-07-07 MED ORDER — ASPIRIN EC 81 MG PO TBEC
81.0000 mg | DELAYED_RELEASE_TABLET | Freq: Every day | ORAL | Status: DC
  Administered 2019-07-08: 81 mg via ORAL
  Filled 2019-07-07: qty 1

## 2019-07-07 MED ORDER — DESMOPRESSIN ACETATE 0.1 MG PO TABS
0.2000 mg | ORAL_TABLET | Freq: Every day | ORAL | Status: DC
  Administered 2019-07-07: 0.2 mg via ORAL
  Filled 2019-07-07: qty 2
  Filled 2019-07-07: qty 1

## 2019-07-07 MED ORDER — SODIUM CHLORIDE 0.9% FLUSH
3.0000 mL | Freq: Once | INTRAVENOUS | Status: AC
  Administered 2019-07-07: 3 mL via INTRAVENOUS

## 2019-07-07 MED ORDER — ATORVASTATIN CALCIUM 20 MG PO TABS: 20.0000 mg | ORAL_TABLET | Freq: Every evening | ORAL | Status: DC

## 2019-07-07 MED ORDER — POLYETHYLENE GLYCOL 3350 17 G PO PACK: 17.0000 g | PACK | Freq: Every day | ORAL | Status: DC | PRN

## 2019-07-07 MED ORDER — CIPROFLOXACIN IN D5W 400 MG/200ML IV SOLN
400.0000 mg | Freq: Two times a day (BID) | INTRAVENOUS | Status: DC
  Administered 2019-07-07: 400 mg via INTRAVENOUS
  Filled 2019-07-07: qty 200

## 2019-07-07 MED ORDER — ACETAMINOPHEN 650 MG RE SUPP
650.0000 mg | Freq: Once | RECTAL | Status: AC
  Administered 2019-07-07: 650 mg via RECTAL
  Filled 2019-07-07: qty 1

## 2019-07-07 MED ORDER — ALBUTEROL SULFATE HFA 108 (90 BASE) MCG/ACT IN AERS
2.0000 | INHALATION_SPRAY | RESPIRATORY_TRACT | Status: DC | PRN
  Administered 2019-07-07: 2 via RESPIRATORY_TRACT
  Filled 2019-07-07: qty 6.7

## 2019-07-07 MED ORDER — DULOXETINE HCL 20 MG PO CPEP
20.0000 mg | ORAL_CAPSULE | Freq: Every day | ORAL | Status: DC
  Administered 2019-07-08: 20 mg via ORAL
  Filled 2019-07-07 (×2): qty 1

## 2019-07-07 MED ORDER — GABAPENTIN 300 MG PO CAPS
300.0000 mg | ORAL_CAPSULE | Freq: Three times a day (TID) | ORAL | Status: DC
  Administered 2019-07-07 – 2019-07-08 (×3): 300 mg via ORAL
  Filled 2019-07-07 (×3): qty 1

## 2019-07-07 MED ORDER — MIRABEGRON ER 25 MG PO TB24
50.0000 mg | ORAL_TABLET | Freq: Every day | ORAL | Status: DC
  Administered 2019-07-08: 50 mg via ORAL
  Filled 2019-07-07 (×2): qty 2

## 2019-07-07 MED ORDER — ACETAMINOPHEN 325 MG PO TABS: 650.0000 mg | ORAL_TABLET | Freq: Four times a day (QID) | ORAL | Status: DC | PRN

## 2019-07-07 MED ORDER — LOSARTAN POTASSIUM 25 MG PO TABS
25.0000 mg | ORAL_TABLET | Freq: Every morning | ORAL | Status: DC
  Administered 2019-07-08: 25 mg via ORAL
  Filled 2019-07-07: qty 1

## 2019-07-07 MED ORDER — SODIUM CHLORIDE 0.9 % IV SOLN
2.0000 g | INTRAVENOUS | Status: DC
  Administered 2019-07-07 – 2019-07-08 (×2): 2 g via INTRAVENOUS
  Filled 2019-07-07 (×2): qty 2

## 2019-07-07 MED ORDER — SODIUM CHLORIDE (PF) 0.9 % IJ SOLN
INTRAMUSCULAR | Status: AC
  Filled 2019-07-07: qty 50

## 2019-07-07 MED ORDER — KETOROLAC TROMETHAMINE 15 MG/ML IJ SOLN: 15.0000 mg | Freq: Four times a day (QID) | INTRAMUSCULAR | Status: DC | PRN

## 2019-07-07 MED ORDER — PANTOPRAZOLE SODIUM 40 MG PO TBEC
40.0000 mg | DELAYED_RELEASE_TABLET | Freq: Every day | ORAL | Status: DC
  Administered 2019-07-08: 40 mg via ORAL
  Filled 2019-07-07: qty 1

## 2019-07-07 MED ORDER — ACETAMINOPHEN 650 MG RE SUPP: 650.0000 mg | Freq: Four times a day (QID) | RECTAL | Status: DC | PRN

## 2019-07-07 MED ORDER — CLOPIDOGREL BISULFATE 75 MG PO TABS
75.0000 mg | ORAL_TABLET | Freq: Every morning | ORAL | Status: DC
  Administered 2019-07-08: 75 mg via ORAL
  Filled 2019-07-07: qty 1

## 2019-07-07 MED ORDER — ENOXAPARIN SODIUM 40 MG/0.4ML ~~LOC~~ SOLN
40.0000 mg | SUBCUTANEOUS | Status: DC
  Administered 2019-07-07 – 2019-07-08 (×2): 40 mg via SUBCUTANEOUS
  Filled 2019-07-07: qty 0.4

## 2019-07-07 MED ORDER — IOHEXOL 300 MG/ML  SOLN
100.0000 mL | Freq: Once | INTRAMUSCULAR | Status: AC | PRN
  Administered 2019-07-07: 100 mL via INTRAVENOUS

## 2019-07-07 NOTE — ED Triage Notes (Signed)
Per ems: pt coming from [place] c/o low oxygen levels, shakiness and chills. Seen 1 week ago for uti and treated for it. C/o abdominal pain and has not had a bowel movement in a couple days. Pt cant communicate due to previous stroke.   99.6 temp 110 HR 160/94 92 % room air

## 2019-07-07 NOTE — Sepsis Progress Note (Signed)
Notified bedside nurse of need to order antibiotics. 

## 2019-07-07 NOTE — H&P (Signed)
History and Physical    Kristin [name] [id number] DOB: Jul 24, 1940 DOA: 07/07/2019  PCP: Gayland Curry, DO (Confirm with patient/family/NH records and if not entered, this has to be entered at [facility] point of entry) Patient coming from: [place]  I have personally briefly reviewed patient's old medical records in [place]  Chief Complaint: Abdominal pain and fever  HPI: Kristin [name] is a 79 y.o. female with medical history significant of CVA with residual right hemiplegia and expressive aphasia, CAD s/p PCI/Stenting, s/p CABG, HTN, Diploplia, CKD, nephrolithiasis. She is known to have multiple small stones in the right renal pelvis up to 4 mm in size with no ureteral stones at last urology evaluation. April 8th she was started on a course of macrobid for UTI based on positive urine cultures.  Tuesday 3/16 she had a bout of marked abdominal pain and started running a fever. She had been constipated. She also had mild increase work of breathing. Because of abdominal pain and fever she presented to [facility] for evaluation    (For level 3, the HPI must include 4+ descriptors: Location, Quality, Severity, Duration, Timing, Context, modifying factors, associated signs/symptoms and/or status of 3+ chronic problems.)  (Please avoid self-populating past medical history here) (The initial 2-3 lines should be focused and good to copy and paste in the HPI section of the daily progress note).  ED Course: On arrival she was tachycardic, tachypneic and had fever to 104.3. BP was stable to elevated. Lab revealed normal WBC with minor left shift with 84% segs. Lipase 17.  U/A positive for blood and >50 RBC, 5 WBCs, rare bacteria. CXR negative for active disease. EKG with sinus tach, PVCs, possible left anterior fasicular block. CT positive for cluster of small stones right kidney lower pole w/o ureteral stones. Patient referred to [facility] for admission for further evaluation and treatment.   Review of Systems:  As per HPI otherwise 10 point review of systems negative. Patient does report sharp chest pain at the sternum.  Unacceptable ROS statements: "10 systems reviewed," "Extensive" (without elaboration).  Acceptable ROS statements: "All others negative," "All others reviewed and are negative," and "All others unremarkable," with at [place] documented Can't double dip - if using for HPI can't use for ROS  Past Medical History:  Diagnosis Date  . Arthritis    hands  . CAD (coronary artery disease)    a. 2010: stents pRCA and dRCA. b. LHC 12/13/13: Minimal distal LM dz. LAD 95% ostial w/ L-->R collat. LCx mild dz. RCA 95% mid-dRCA. Patent stents proximal and distal RCA. EF not assessed, Nl by nuclear study.     . Central retinal vein occlusion, right eye   . Chronic kidney disease    hx kidney stone  . Depression   . Diplopia   . Ear pain    right  . Eosinophilic esophagitis   . Essential hypertension   . GERD (gastroesophageal reflux disease)   . H/O: whooping cough   . Hyperlipidemia with target LDL less than 70   . Incontinence    urine  . Stroke [facility]) 2001   reesultant Rt spastic hemplegia and aphasia     Past Surgical History:  Procedure Laterality Date  . ABDOMINAL HYSTERECTOMY    . CAROTID STENT    . cataracts    . COLONOSCOPY N/A 10/05/2015   Procedure: COLONOSCOPY;  Surgeon: Manus Gunning, MD;  Location: [facility];  Service: Gastroenterology;  Laterality: N/A;  . CORONARY ARTERY  BYPASS GRAFT N/A 12/17/2013   Procedure: CORONARY ARTERY BYPASS GRAFTING (CABG);  Surgeon: Gaye Pollack, MD;  Location: [facility];  Service: Open Heart Surgery;  Laterality: N/A;  Times 2 using left internal mammary artery and endoscopically harvested right saphenous vein  . ESOPHAGOGASTRODUODENOSCOPY    . ESOPHAGOGASTRODUODENOSCOPY N/A 10/05/2015   Procedure: ESOPHAGOGASTRODUODENOSCOPY (EGD);  Surgeon: Manus Gunning, MD;  Location: [facility];  Service: Gastroenterology;   Laterality: N/A;  . INTRAOPERATIVE TRANSESOPHAGEAL ECHOCARDIOGRAM N/A 12/17/2013   Procedure: INTRAOPERATIVE TRANSESOPHAGEAL ECHOCARDIOGRAM;  Surgeon: Gaye Pollack, MD;  Location: [facility];  Service: Open Heart Surgery;  Laterality: N/A;  . lasix    . LEFT HEART CATHETERIZATION WITH CORONARY ANGIOGRAM N/A 12/13/2013   Procedure: LEFT HEART CATHETERIZATION WITH CORONARY ANGIOGRAM;  Surgeon: Jettie Booze, MD;  Location: [facility] CATH LAB;  Service: Cardiovascular;  Laterality: N/A;  . retina injection     Soc Hx - 1st marriage ended in divorce. Patient remarried and has just been widowed after 30 years. She lives at [employer] with her husband until he died last week. She has devoted step-daughters. She is a retired Pharmacist, hospital. She had been independent in ADLs with the help of her husband.    reports that she has never smoked. She has never used smokeless tobacco. She reports current alcohol use. She reports that she does not use drugs.  No Known Allergies  Family History  Problem Relation Age of Onset  . Diabetes Mother   . Heart disease Mother   . Heart attack Father   . Breast cancer Sister    Unacceptable: Noncontributory, unremarkable, or negative. Acceptable: Family history reviewed and not pertinent (If you reviewed it)  Prior to Admission medications   Medication Sig Start Date End Date Taking? Authorizing Provider  aspirin 81 MG tablet Take 81 mg by mouth daily.   Yes [provider]  atorvastatin (LIPITOR) 20 MG tablet TAKE 1 TABLET EACH DAY. Patient taking differently: Take 20 mg by mouth every evening.  07/02/19  Yes Reed, Tiffany L, DO  clopidogrel (PLAVIX) 75 MG tablet TAKE 1 TABLET EACH DAY. Patient taking differently: Take 75 mg by mouth in the morning.  07/02/19  Yes Reed, Tiffany L, DO  Coenzyme Q10 (COQ-10 PO) Take 1 tablet daily by mouth.   Yes [provider]  desmopressin (DDAVP) 0.2 MG tablet Take 0.2 mg by mouth at bedtime.    Yes [provider]  diclofenac sodium (VOLTAREN) 1 % GEL Apply 4 g topically 4 (four) times daily as needed (pain).    Yes [provider]  DULoxetine (CYMBALTA) 20 MG capsule Take 1 capsule (20 mg total) by mouth daily. 06/17/19  Yes Reed, Tiffany L, DO  gabapentin (NEURONTIN) 300 MG capsule Take 1 capsule (300 mg total) by mouth 3 (three) times daily. 04/20/19  Yes Raulkar, Clide Deutscher, MD  losartan (COZAAR) 25 MG tablet TAKE 1 TABLET ONCE DAILY. Patient taking differently: Take 25 mg by mouth in the morning.  07/02/19  Yes Reed, Tiffany L, DO  mirabegron ER (MYRBETRIQ) 50 MG TB24 tablet Take 50 mg by mouth daily.  08/20/16  Yes [provider]  nitrofurantoin, macrocrystal-monohydrate, (MACROBID) 100 MG capsule Take 100 mg by mouth 2 (two) times daily. 06/28/19  Yes [provider]  omeprazole (PRILOSEC) 20 MG capsule TAKE 1 CAPSULE EVERY DAY. Patient taking differently: Take 20 mg by mouth in the morning.  07/02/19  Yes Reed, Tiffany L, DO  TURMERIC PO Take 1 tablet  by mouth daily.    Yes [provider]  nitroGLYCERIN (NITROSTAT) 0.4 MG SL tablet Place 1 tablet (0.4 mg total) under the tongue every 5 (five) minutes as needed for chest pain. 10/18/15   Florencia Reasons, MD    Physical Exam: Vitals:   07/07/19 0430 07/07/19 0445 07/07/19 0500 07/07/19 0515  BP: 140/75 (!) 144/75 135/70 134/68  Pulse: 98 95 95 96  Resp: (!) 31 (!) 29 (!) 30 (!) 30  Temp:      TempSrc:      SpO2: 93% 94% 93% 95%    Constitutional: NAD, calm, comfortable Vitals:   07/07/19 0430 07/07/19 0445 07/07/19 0500 07/07/19 0515  BP: 140/75 (!) 144/75 135/70 134/68  Pulse: 98 95 95 96  Resp: (!) 31 (!) 29 (!) 30 (!) 30  Temp:      TempSrc:      SpO2: 93% 94% 93% 95%   General -  WNWD elderly woman in no acute distress. Is able to communicate non-verbally with the help of a step-daughter. Eyes: PERRL, lids and conjunctivae normal ENMT: Mucous membranes are dry. Posterior pharynx clear of any exudate or  lesions.Normal dentition.  Neck: normal, supple, no masses, no thyromegaly Respiratory: Mild increased WOB, shallow inspirations, feint basilar rales, no wheezing. Cardiovascular: Regular tachycardia with infrequent premature beats. Holosystolic mm best at RSB graded II/VI, LSB also with holosystolic mm.  No extremity edema. 2+ pedal pulses. No carotid bruits. Chest wall - erythema at sternotomy scar. Very tender to palpation at the costo-sternal border bilaterally, worse on the right.  Abdomen: mild  Tenderness RLQ, no masses palpated. No hepatosplenomegaly. Bowel sounds hypoactive.  Musculoskeletal: no clubbing / cyanosis. No joint deformity upper and lower extremities. Good ROM actively left, passively right UE, no contractures. Decreased muscle tone.  Skin: no rashes, lesions, ulcers. No induration Neurologic: CN 2-12: normal facial symmetry, no tongue fasiculation or deviation, normal facial movement. Expressive aphasia.  Strength 4/5 left UE/LE. Flaccid right UE.  Psychiatric: Normal judgment and insight. Alert and oriented x 3. Normal mood.   (Anything < 9 systems with 2 bullets each down codes to level 1) (If patient refuses exam can't bill higher level) (Make sure to document decubitus ulcers present on admission -- if possible -- and whether patient has chronic indwelling catheter at time of admission)  Labs on Admission: I have personally reviewed following labs and imaging studies  CBC: Recent Labs  Lab 07/07/19 0128  WBC 10.3  NEUTROABS 8.6*  HGB 15.0  HCT 46.9*  MCV 93.6  PLT 123456   Basic Metabolic Panel: Recent Labs  Lab 07/07/19 0124  NA 137  K 4.7  CL 101  CO2 27  GLUCOSE 143*  BUN 19  CREATININE 0.61  CALCIUM 8.8*   GFR: CrCl cannot be calculated (Unknown ideal weight.). Liver Function Tests: Recent Labs  Lab 07/07/19 0124  AST 54*  ALT 43  ALKPHOS 86  BILITOT 1.6*  PROT 7.5  ALBUMIN 4.0   Recent Labs  Lab 07/07/19 0124  LIPASE 17   No  results for input(s): AMMONIA in the last 168 hours. Coagulation Profile: Recent Labs  Lab 07/07/19 0128  INR 1.0   Cardiac Enzymes: No results for input(s): CKTOTAL, CKMB, CKMBINDEX, TROPONINI in the last 168 hours. BNP (last 3 results) No results for input(s): PROBNP in the last 8760 hours. HbA1C: No results for input(s): HGBA1C in the last 72 hours. CBG: No results for input(s): GLUCAP in the last 168 hours. Lipid  Profile: No results for input(s): CHOL, HDL, LDLCALC, TRIG, CHOLHDL, LDLDIRECT in the last 72 hours. Thyroid Function Tests: No results for input(s): TSH, T4TOTAL, FREET4, T3FREE, THYROIDAB in the last 72 hours. Anemia Panel: No results for input(s): VITAMINB12, FOLATE, FERRITIN, TIBC, IRON, RETICCTPCT in the last 72 hours. Urine analysis:    Component Value Date/Time   COLORURINE YELLOW 07/07/2019 0124   APPEARANCEUR CLEAR 07/07/2019 0124   LABSPEC 1.021 07/07/2019 0124   PHURINE 5.0 07/07/2019 0124   GLUCOSEU NEGATIVE 07/07/2019 0124   HGBUR LARGE (A) 07/07/2019 0124   BILIRUBINUR NEGATIVE 07/07/2019 0124   KETONESUR 5 (A) 07/07/2019 0124   PROTEINUR 30 (A) 07/07/2019 0124   UROBILINOGEN 0.2 12/09/2013 1454   NITRITE NEGATIVE 07/07/2019 0124   LEUKOCYTESUR NEGATIVE 07/07/2019 0124    Radiological Exams on Admission: CT ABDOMEN PELVIS W CONTRAST  Result Date: 07/07/2019 CLINICAL DATA:  Fever with lower abdominal pain EXAM: CT ABDOMEN AND PELVIS WITH CONTRAST TECHNIQUE: Multidetector CT imaging of the abdomen and pelvis was performed using the standard protocol following bolus administration of intravenous contrast. CONTRAST:  160mL OMNIPAQUE IOHEXOL 300 MG/ML  SOLN COMPARISON:  03/16/2019 FINDINGS: Lower chest: Extensive coronary atherosclerosis. Septal thickening at both lung bases with patchy atelectasis. Hepatobiliary: Tiny low-density in the inferior right liver that is too small for densitometry.Cholelithiasis without signs of cholecystitis. Pancreas:  Generalized atrophy Spleen: Unremarkable. Adrenals/Urinary Tract: Negative adrenals. Lobulated right renal cortex from scarring. Also on the right is multiple stones clustered at the lower pole and measuring up to 4 mm individually. No hydronephrosis or ureteral stone. The urothelium at the right renal pelvis appears thickened and indistinct diffusely. Unremarkable bladder. Stomach/Bowel: No obstruction. Extensive distal colonic diverticulosis without diverticulitis. No visualized appendix. No pericecal inflammation. Vascular/Lymphatic: No acute vascular abnormality. Diffuse atherosclerotic calcification. No mass or adenopathy. Reproductive:Hysterectomy Other: No ascites or pneumoperitoneum. Fatty high left lumbar hernia. Musculoskeletal: No acute abnormalities. Advanced spinal degeneration with lumbar scoliosis. IMPRESSION: 1. Urothelial thickening at the right renal pelvis where there are multiple calculi and cortical scarring, are there symptoms of urinary tract infection? 2. Colonic diverticulosis without visible diverticulitis. 3. Septal thickening at the lung bases consistent with mild edema. 4. Fatty high left lumbar hernia. 5.  Aortic Atherosclerosis (ICD10-I70.0). 6. Cholelithiasis. Electronically Signed   By: Monte Fantasia M.D.   On: 07/07/2019 04:11   DG Chest Port 1 View  Result Date: 07/07/2019 CLINICAL DATA:  Fever, tachypnea EXAM: PORTABLE CHEST 1 VIEW COMPARISON:  Radiograph 12/25/2015 FINDINGS: Chronically coarsened interstitial changes and bandlike area of scarring in the left lung base. No consolidation, features of edema, pneumothorax, or effusion. Postsurgical changes related to prior CABG including intact and aligned sternotomy wires and multiple surgical clips projecting over the mediastinum. The aorta is calcified. The remaining cardiomediastinal contours are unremarkable. IMPRESSION: Chronic changes as above.  No acute cardiopulmonary abnormality. Electronically Signed   By: Lovena Le M.D.   On: 07/07/2019 02:23    EKG: Independently reviewed. Sinus tach, PVCs, LA FB  Assessment/Plan Active Problems:   Nephrolithiasis   Acute lower UTI   Fever   Acute costochondritis   Essential hypertension   Spastic hemiplegia affecting dominant side (HCC)   Aphasia as late effect of stroke  (please populate well all problems here in Problem List. (For example, if patient is on BP meds at home and you resume or decide to hold them, it is a problem that needs to be her. Same for CAD, COPD, HLD and so on)  1. Acute lower UTI - patient with recent positive urine culture for which she was started on Macrobid 06/28/19. Question of efficacy in the elderly although no pyuria on U/A - may be incompletely treated. Two previous cultures positive for Klebsiella Oxytoca which is a resistant organism   Plan  Ciprofloxacin 400 mg IV q12 with switch to oral when afebrile  2. Nephrolithiasis - patient with known cluster of small stones. With severe pain and hematuria, w/o ureteral stone on CT or hydronephrosis suspect she may have passed a stone.  Plan Ketorolac for pain  3. HTN - continue home meds  4. Cardiac mm -  Last echo 2015 - unable to pull up report. Per step-daughter and patient no prior h/o mm.  Plan 2D echo  5. Chest wall pain - patient with sharp chest pain with marked tenderness on exam. Unlike previous angina. EKG w/o acute change  Plan Troponins  Ketorolac for pain  6. Hyperglycemia - last A1C 06/02/18 was 5.4%   DVT prophylaxis: lovenox (Lovenox/Heparin/SCD's/anticoagulated/None (if comfort care) Code Status: full code (Full/Partial (specify details) Family Communication: step daughter present at exam (Specify name, relationship. Do not write "discussed with patient". Specify tel # if discussed over the phone) Disposition Plan: return to [facility] when stable (specify when and where you expect patient to be discharged) Consults called: none (with  names) Admission status: inpatient (inpatient / obs / tele / medical floor / SDU)   Adella Hare MD [facility] Pager [phone]  If 7PM-7AM, please contact night-coverage [email or web address] Password [facility]  07/07/2019, 5:56 AM

## 2019-07-07 NOTE — ED Notes (Signed)
Attempted to call report x2. RN unavailable at moment and stated she will call back before shift change.

## 2019-07-07 NOTE — ED Notes (Signed)
X-ray at bedside

## 2019-07-07 NOTE — ED Provider Notes (Signed)
[facility] DEPT Provider Note: Georgena Spurling, MD, FACEP  CSN: [id number] MRN: [id number] ARRIVAL: 07/07/19 at [place]: [facility]  Abdominal Pain  Level 5 caveat: Aphasia HISTORY OF PRESENT ILLNESS  07/07/19 1:25 AM Kristin [name] is a 79 y.o. female who was sent from her nursing home for suspected abdominal pain and lack of a bowel movement in "a couple of" days.  She was noted to meet sepsis criteria on arrival (temperature 104.3, pulse rate 110, respiration 38, oxygen saturation 92%) and sepsis protocol was initiated.  The patient is aphasic and it is unclear if she is having abdominal pain.  Her daughter reports chills, shortness of breath and lower abdominal pain yesterday.  She was recently treated for urinary tract infection.  She has right hemiparesis from a previous stroke.   Past Medical History:  Diagnosis Date  . Arthritis    hands  . CAD (coronary artery disease)    a. 2010: stents pRCA and dRCA. b. LHC 12/13/13: Minimal distal LM dz. LAD 95% ostial w/ L-->R collat. LCx mild dz. RCA 95% mid-dRCA. Patent stents proximal and distal RCA. EF not assessed, Nl by nuclear study.     . Central retinal vein occlusion, right eye   . Chronic kidney disease    hx kidney stone  . Depression   . Diplopia   . Ear pain    right  . Eosinophilic esophagitis   . Essential hypertension   . GERD (gastroesophageal reflux disease)   . H/O: whooping cough   . Hyperlipidemia with target LDL less than 70   . Incontinence    urine  . Stroke [facility]) 2001   reesultant Rt spastic hemplegia and aphasia     Past Surgical History:  Procedure Laterality Date  . ABDOMINAL HYSTERECTOMY    . CAROTID STENT    . cataracts    . COLONOSCOPY N/A 10/05/2015   Procedure: COLONOSCOPY;  Surgeon: Manus Gunning, MD;  Location: [facility];  Service: Gastroenterology;  Laterality: N/A;  . CORONARY ARTERY BYPASS GRAFT N/A 12/17/2013   Procedure: CORONARY ARTERY BYPASS GRAFTING  (CABG);  Surgeon: Gaye Pollack, MD;  Location: [place];  Service: Open Heart Surgery;  Laterality: N/A;  Times 2 using left internal mammary artery and endoscopically harvested right saphenous vein  . ESOPHAGOGASTRODUODENOSCOPY    . ESOPHAGOGASTRODUODENOSCOPY N/A 10/05/2015   Procedure: ESOPHAGOGASTRODUODENOSCOPY (EGD);  Surgeon: Manus Gunning, MD;  Location: [facility];  Service: Gastroenterology;  Laterality: N/A;  . INTRAOPERATIVE TRANSESOPHAGEAL ECHOCARDIOGRAM N/A 12/17/2013   Procedure: INTRAOPERATIVE TRANSESOPHAGEAL ECHOCARDIOGRAM;  Surgeon: Gaye Pollack, MD;  Location: [place] [facility];  Service: Open Heart Surgery;  Laterality: N/A;  . lasix    . LEFT HEART CATHETERIZATION WITH CORONARY ANGIOGRAM N/A 12/13/2013   Procedure: LEFT HEART CATHETERIZATION WITH CORONARY ANGIOGRAM;  Surgeon: Jettie Booze, MD;  Location: [facility] CATH LAB;  Service: Cardiovascular;  Laterality: N/A;  . retina injection      Family History  Problem Relation Age of Onset  . Diabetes Mother   . Heart disease Mother   . Heart attack Father   . Breast cancer Sister     Social History   Tobacco Use  . Smoking status: Never Smoker  . Smokeless tobacco: Never Used  Substance Use Topics  . Alcohol use: Yes    Alcohol/week: 0.0 standard drinks    Comment: glass of wine a day  . Drug use: No    Prior to Admission medications  Medication Sig Start Date End Date Taking? Authorizing Provider  aspirin 81 MG tablet Take 81 mg by mouth daily.   Yes [provider]  atorvastatin (LIPITOR) 20 MG tablet TAKE 1 TABLET EACH DAY. Patient taking differently: Take 20 mg by mouth every evening.  07/02/19  Yes Reed, Tiffany L, DO  clopidogrel (PLAVIX) 75 MG tablet TAKE 1 TABLET EACH DAY. Patient taking differently: Take 75 mg by mouth in the morning.  07/02/19  Yes Reed, Tiffany L, DO  Coenzyme Q10 (COQ-10 PO) Take 1 tablet daily by mouth.   Yes [provider]  desmopressin (DDAVP) 0.2 MG tablet Take  0.2 mg by mouth at bedtime.    Yes [provider]  diclofenac sodium (VOLTAREN) 1 % GEL Apply 4 g topically 4 (four) times daily as needed (pain).    Yes [provider]  DULoxetine (CYMBALTA) 20 MG capsule Take 1 capsule (20 mg total) by mouth daily. 06/17/19  Yes Reed, Tiffany L, DO  gabapentin (NEURONTIN) 300 MG capsule Take 1 capsule (300 mg total) by mouth 3 (three) times daily. 04/20/19  Yes Raulkar, Clide Deutscher, MD  losartan (COZAAR) 25 MG tablet TAKE 1 TABLET ONCE DAILY. Patient taking differently: Take 25 mg by mouth in the morning.  07/02/19  Yes Reed, Tiffany L, DO  mirabegron ER (MYRBETRIQ) 50 MG TB24 tablet Take 50 mg by mouth daily.  08/20/16  Yes [provider]  nitrofurantoin, macrocrystal-monohydrate, (MACROBID) 100 MG capsule Take 100 mg by mouth 2 (two) times daily. 06/28/19  Yes [provider]  omeprazole (PRILOSEC) 20 MG capsule TAKE 1 CAPSULE EVERY DAY. Patient taking differently: Take 20 mg by mouth in the morning.  07/02/19  Yes Reed, Tiffany L, DO  TURMERIC PO Take 1 tablet by mouth daily.    Yes [provider]  nitroGLYCERIN (NITROSTAT) 0.4 MG SL tablet Place 1 tablet (0.4 mg total) under the tongue every 5 (five) minutes as needed for chest pain. 10/18/15   Florencia Reasons, MD    Allergies Patient has no known allergies.   REVIEW OF SYSTEMS  Negative except as noted here or in the History of Present Illness.   PHYSICAL EXAMINATION  Initial Vital Signs Blood pressure (!) 159/89, pulse (!) 110, temperature (!) 104.3 F (40.2 C), temperature source Rectal, resp. rate (!) 38, SpO2 92 %.  Examination General: Well-developed, well-nourished female in mild respiratory distress; appearance consistent with age of record HENT: normocephalic; atraumatic Eyes: pupils equal, round and reactive to light; extraocular muscles intact; bilateral pseudophakia Neck: supple Heart: regular rate and rhythm; tachycardia Lungs: Clear but decreased  air movement bilaterally; tachypnea; accessory muscle use Abdomen: soft; nondistended; difficult to assess abdominal tenderness; bowel sounds present Extremities: No deformity; full range of motion; pulses normal Neurologic: Awake, alert; expressive aphasia; right hemiparesis Skin: Warm and dry Psychiatric: Anxious   RESULTS  Summary of this visit's results, reviewed and interpreted by myself:   EKG Interpretation  Date/Time:  Wednesday July 07 2019 01:58:36 EDT Ventricular Rate:  108 PR Interval:    QRS Duration: 84 QT Interval:  341 QTC Calculation: 457 R Axis:   -57 Text Interpretation: Sinus tachycardia Multiform ventricular premature complexes LAE, consider biatrial enlargement LAD, consider left anterior fascicular block Artifact Confirmed by Jennet Scroggin, Jenny Reichmann [phone]) on 07/07/2019 2:32:40 AM      Laboratory Studies: Results for orders placed or performed during the hospital encounter of 07/07/19 (from the past 24 hour(s))  Lipase, blood     Status: None  Collection Time: 07/07/19  1:24 AM  Result Value Ref Range   Lipase 17 11 - 51 U/L  Comprehensive metabolic panel     Status: Abnormal   Collection Time: 07/07/19  1:24 AM  Result Value Ref Range   Sodium 137 135 - 145 mmol/L   Potassium 4.7 3.5 - 5.1 mmol/L   Chloride 101 98 - 111 mmol/L   CO2 27 22 - 32 mmol/L   Glucose, Bld 143 (H) 70 - 99 mg/dL   BUN 19 8 - 23 mg/dL   Creatinine, Ser 0.61 0.44 - 1.00 mg/dL   Calcium 8.8 (L) 8.9 - 10.3 mg/dL   Total Protein 7.5 6.5 - 8.1 g/dL   Albumin 4.0 3.5 - 5.0 g/dL   AST 54 (H) 15 - 41 U/L   ALT 43 0 - 44 U/L   Alkaline Phosphatase 86 38 - 126 U/L   Total Bilirubin 1.6 (H) 0.3 - 1.2 mg/dL   GFR calc non Af Amer >60 >60 mL/min   GFR calc Af Amer >60 >60 mL/min   Anion gap 9 5 - 15  Urinalysis, Routine w reflex microscopic     Status: Abnormal   Collection Time: 07/07/19  1:24 AM  Result Value Ref Range   Color, Urine YELLOW YELLOW   APPearance CLEAR CLEAR   Specific  Gravity, Urine 1.021 1.005 - 1.030   pH 5.0 5.0 - 8.0   Glucose, UA NEGATIVE NEGATIVE mg/dL   Hgb urine dipstick LARGE (A) NEGATIVE   Bilirubin Urine NEGATIVE NEGATIVE   Ketones, ur 5 (A) NEGATIVE mg/dL   Protein, ur 30 (A) NEGATIVE mg/dL   Nitrite NEGATIVE NEGATIVE   Leukocytes,Ua NEGATIVE NEGATIVE   RBC / HPF >50 (H) 0 - 5 RBC/hpf   WBC, UA 0-5 0 - 5 WBC/hpf   Bacteria, UA RARE (A) NONE SEEN   Mucus PRESENT   CBC with Differential/Platelet     Status: Abnormal   Collection Time: 07/07/19  1:28 AM  Result Value Ref Range   WBC 10.3 4.0 - 10.5 K/uL   RBC 5.01 3.87 - 5.11 MIL/uL   Hemoglobin 15.0 12.0 - 15.0 g/dL   HCT 46.9 (H) 36.0 - 46.0 %   MCV 93.6 80.0 - 100.0 fL   MCH 29.9 26.0 - 34.0 pg   MCHC 32.0 30.0 - 36.0 g/dL   RDW 12.3 11.5 - 15.5 %   Platelets 201 150 - 400 K/uL   nRBC 0.0 0.0 - 0.2 %   Neutrophils Relative % 84 %   Neutro Abs 8.6 (H) 1.7 - 7.7 K/uL   Lymphocytes Relative 4 %   Lymphs Abs 0.4 (L) 0.7 - 4.0 K/uL   Monocytes Relative 6 %   Monocytes Absolute 0.6 0.1 - 1.0 K/uL   Eosinophils Relative 6 %   Eosinophils Absolute 0.6 (H) 0.0 - 0.5 K/uL   Basophils Relative 0 %   Basophils Absolute 0.0 0.0 - 0.1 K/uL   Immature Granulocytes 0 %   Abs Immature Granulocytes 0.04 0.00 - 0.07 K/uL  APTT     Status: None   Collection Time: 07/07/19  1:28 AM  Result Value Ref Range   aPTT 34 24 - 36 seconds  Protime-INR     Status: None   Collection Time: 07/07/19  1:28 AM  Result Value Ref Range   Prothrombin Time 13.1 11.4 - 15.2 seconds   INR 1.0 0.8 - 1.2  Lactic acid, plasma     Status: None  Collection Time: 07/07/19  1:29 AM  Result Value Ref Range   Lactic Acid, Venous 1.4 0.5 - 1.9 mmol/L  Respiratory Panel by RT PCR (Flu A&B, Covid) - Nasopharyngeal Swab     Status: None   Collection Time: 07/07/19  2:41 AM   Specimen: Nasopharyngeal Swab  Result Value Ref Range   SARS Coronavirus 2 by RT PCR NEGATIVE NEGATIVE   Influenza A by PCR NEGATIVE NEGATIVE    Influenza B by PCR NEGATIVE NEGATIVE   Imaging Studies: CT ABDOMEN PELVIS W CONTRAST  Result Date: 07/07/2019 CLINICAL DATA:  Fever with lower abdominal pain EXAM: CT ABDOMEN AND PELVIS WITH CONTRAST TECHNIQUE: Multidetector CT imaging of the abdomen and pelvis was performed using the standard protocol following bolus administration of intravenous contrast. CONTRAST:  183mL OMNIPAQUE IOHEXOL 300 MG/ML  SOLN COMPARISON:  03/16/2019 FINDINGS: Lower chest: Extensive coronary atherosclerosis. Septal thickening at both lung bases with patchy atelectasis. Hepatobiliary: Tiny low-density in the inferior right liver that is too small for densitometry.Cholelithiasis without signs of cholecystitis. Pancreas: Generalized atrophy Spleen: Unremarkable. Adrenals/Urinary Tract: Negative adrenals. Lobulated right renal cortex from scarring. Also on the right is multiple stones clustered at the lower pole and measuring up to 4 mm individually. No hydronephrosis or ureteral stone. The urothelium at the right renal pelvis appears thickened and indistinct diffusely. Unremarkable bladder. Stomach/Bowel: No obstruction. Extensive distal colonic diverticulosis without diverticulitis. No visualized appendix. No pericecal inflammation. Vascular/Lymphatic: No acute vascular abnormality. Diffuse atherosclerotic calcification. No mass or adenopathy. Reproductive:Hysterectomy Other: No ascites or pneumoperitoneum. Fatty high left lumbar hernia. Musculoskeletal: No acute abnormalities. Advanced spinal degeneration with lumbar scoliosis. IMPRESSION: 1. Urothelial thickening at the right renal pelvis where there are multiple calculi and cortical scarring, are there symptoms of urinary tract infection? 2. Colonic diverticulosis without visible diverticulitis. 3. Septal thickening at the lung bases consistent with mild edema. 4. Fatty high left lumbar hernia. 5.  Aortic Atherosclerosis (ICD10-I70.0). 6. Cholelithiasis. Electronically Signed    By: Monte Fantasia M.D.   On: 07/07/2019 04:11   DG Chest Port 1 View  Result Date: 07/07/2019 CLINICAL DATA:  Fever, tachypnea EXAM: PORTABLE CHEST 1 VIEW COMPARISON:  Radiograph 12/25/2015 FINDINGS: Chronically coarsened interstitial changes and bandlike area of scarring in the left lung base. No consolidation, features of edema, pneumothorax, or effusion. Postsurgical changes related to prior CABG including intact and aligned sternotomy wires and multiple surgical clips projecting over the mediastinum. The aorta is calcified. The remaining cardiomediastinal contours are unremarkable. IMPRESSION: Chronic changes as above.  No acute cardiopulmonary abnormality. Electronically Signed   By: Lovena Le M.D.   On: 07/07/2019 02:23    ED COURSE and MDM  Nursing notes, initial and subsequent vitals signs, including pulse oximetry, reviewed and interpreted by myself.  Vitals:   07/07/19 0245 07/07/19 0315 07/07/19 0330 07/07/19 0415  BP: (!) 169/84 (!) 153/82 (!) 161/84 (!) 152/71  Pulse:  (!) 102 (!) 101 99  Resp: (!) 30 (!) 30 (!) 30 (!) 25  Temp:      TempSrc:      SpO2:  96% 94% 97%   Medications  sodium chloride flush (NS) 0.9 % injection 3 mL (has no administration in time range)  albuterol (VENTOLIN HFA) 108 (90 Base) MCG/ACT inhaler 2 puff (2 puffs Inhalation Given 07/07/19 0236)  sodium chloride (PF) 0.9 % injection (has no administration in time range)  sodium chloride 0.9 % bolus 2,250 mL (2,250 mLs Intravenous New Bag/Given 07/07/19 0156)  acetaminophen (TYLENOL) suppository 650  mg (650 mg Rectal Given 07/07/19 0236)  iohexol (OMNIPAQUE) 300 MG/ML solution 100 mL (100 mLs Intravenous Contrast Given 07/07/19 0352)   4:22 AM No source for fever found.  Patient has no leukocytosis, is negative for influenza and COVID-19, urinalysis is not consistent with a urinary tract infection and lungs do not show pneumonia.  Discussed with Dr. Linda Hedges of hospitalist service.  He advises no  antibiotics at this time, just antipyretics.   PROCEDURES  Procedures   ED DIAGNOSES     ICD-10-CM   1. Acute febrile illness  R50.9        Jamyria Ozanich, Jenny Reichmann, MD 07/07/19 [phone]

## 2019-07-07 NOTE — ED Notes (Signed)
Bethena Roys (step daughter) [phone] would like to be called with updates at any time of the day.

## 2019-07-07 NOTE — ED Notes (Signed)
Urine and culture sent to lab  

## 2019-07-07 NOTE — ED Notes (Signed)
Pt repositioned for comfort

## 2019-07-07 NOTE — Progress Notes (Signed)
  Echocardiogram 2D Echocardiogram has been performed.  Jurnie Garritano A Claris Pech 07/07/2019, 1:35 PM

## 2019-07-07 NOTE — Progress Notes (Signed)
Same day note  Patient seen and examined at bedside.  Patient was admitted to the hospital for UTI.  Presented with abdominal pain and fever.  At the time of my evaluation, patient complains of right shoulder pain and spasm.  Physical examination reveals right-sided spastic weakness, aphasic  Laboratory data and imaging was reviewed  Assessment and Plan.  Acute lower UTI.  Incompletely treated as outpatient.  History of Klebsiella infection.  On ciprofloxacin at this time.  Will change to rocephin based on previous culture. Follow urine cultures and blood cultures..  Continue on gentle IV fluid hydration today.  CVA with residual right-sided hemiplegia and expressive aphasia, coronary artery disease status post PCI stent, status post CABG, hypertension.  Continue current level of care.  On dysphagia 3 diet.  Continue aspirin Lipitor Plavix.  Continue losartan.  History of nephrolithiasis.  CT scan with right renal pelvis calculi and cortical scarring.  Disposition.  Patient Is from assisted living facility.  Spoke with the patient's daughter at bedside.  No Charge  Signed,  Delila Pereyra, MD [facility]

## 2019-07-08 LAB — BASIC METABOLIC PANEL
Anion gap: 7 (ref 5–15)
BUN: 10 mg/dL (ref 8–23)
CO2: 24 mmol/L (ref 22–32)
Calcium: 8.1 mg/dL — ABNORMAL LOW (ref 8.9–10.3)
Chloride: 105 mmol/L (ref 98–111)
Creatinine, Ser: 0.42 mg/dL — ABNORMAL LOW (ref 0.44–1.00)
GFR calc Af Amer: >60 mL/min (ref >60)
GFR calc non Af Amer: >60 mL/min (ref >60)
Glucose, Bld: 108 mg/dL — ABNORMAL HIGH (ref 70–99)
Potassium: 3 mmol/L — ABNORMAL LOW (ref 3.5–5.1)
Sodium: 136 mmol/L (ref 135–145)

## 2019-07-08 LAB — CBC
HCT: 39.2 % (ref 36.0–46.0)
Hemoglobin: 12.9 g/dL (ref 12.0–15.0)
MCH: 30.5 pg (ref 26.0–34.0)
MCHC: 32.9 g/dL (ref 30.0–36.0)
MCV: 92.7 fL (ref 80.0–100.0)
Platelets: 162 10*3/uL (ref 150–400)
RBC: 4.23 MIL/uL (ref 3.87–5.11)
RDW: 12.2 % (ref 11.5–15.5)
WBC: 4.4 10*3/uL (ref 4.0–10.5)
nRBC: 0 % (ref 0.0–0.2)

## 2019-07-08 LAB — URINE CULTURE: Culture: NO GROWTH

## 2019-07-08 LAB — MAGNESIUM: Magnesium: 1.9 mg/dL (ref 1.7–2.4)

## 2019-07-08 MED ORDER — POTASSIUM CHLORIDE CRYS ER 20 MEQ PO TBCR
40.0000 meq | EXTENDED_RELEASE_TABLET | Freq: Two times a day (BID) | ORAL | Status: DC
  Administered 2019-07-08: 40 meq via ORAL
  Filled 2019-07-08: qty 2

## 2019-07-08 MED ORDER — CEFDINIR 300 MG PO CAPS: 300.0000 mg | ORAL_CAPSULE | Freq: Two times a day (BID) | ORAL | 0 refills | Status: AC

## 2019-07-08 MED ORDER — POTASSIUM CHLORIDE CRYS ER 20 MEQ PO TBCR
40.0000 meq | EXTENDED_RELEASE_TABLET | Freq: Once | ORAL | Status: AC
  Administered 2019-07-08: 40 meq via ORAL
  Filled 2019-07-08: qty 2

## 2019-07-08 NOTE — Progress Notes (Signed)
PROGRESS NOTE  Kristin [name] [id number] DOB: [birth year] DOA: 07/07/2019 PCP: Gayland Curry, DO   LOS: 1 day   Brief narrative: As per HPI,  Kristin [name] is a 79 y.o. 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  Patient did have prior history of UTI and nephrolithiasis and had received Macrobid in the past.  In the ED, On arrival she was tachycardic, tachypneic and had fever to 104.3.  Lab revealed normal WBC with minor left shift with 84% segs. Lipase 17.  U/A positive for blood and >50 RBC, 5 WBCs, rare bacteria. CXR negative for active disease. EKG with sinus tachy, PVCs, possible left anterior fasicular block. CT positive for cluster of small stones right kidney lower pole w/o ureteral stones. Patient was then admitted hospital for further evaluation and treatment.  Assessment/Plan:  Active Problems:   Spastic hemiplegia affecting dominant side (HCC)   Aphasia as late effect of stroke   Essential hypertension   Nephrolithiasis   Acute lower UTI   Fever   Acute costochondritis   Acute UTI   Acute lower UTI.  Incompletely treated as outpatient.  History of Klebsiella infection.    On IV rocephin based on previous culture.   Urine culture with no growth so far.  Blood culture negative for growth in less than 1 day.   Continue gentle IV fluids.  T-max 100.7.  No leukocytosis.  Hypokalemia.  Potassium 3.0.  Will replenish today.  Check levels in a.m.  CVA with residual right-sided hemiplegia and expressive aphasia.  Continue aspirin Lipitor Plavix.  Stable at this time.  coronary artery disease status post PCI stent, status post CABG, essential hypertension.    On dysphagia 3 diet.  Continue aspirin, Lipitor Plavix, losartan.  History of nephrolithiasis.  CT scan with right renal pelvis calculi and  cortical scarring.  Will need to follow-up with urology as outpatient.  VTE Prophylaxis: Lovenox subcu  Code Status: Full code  Family Communication: None today.  Disposition Plan:  . Patient is from assisted living facility  . likely disposition to assisted living facility by tomorrow . Barriers to discharge: Continue IV antibiotics, follow fever curve, follow blood cultures, replace potassium   Consultants:  None  Procedures:  None  Antibiotics:  . Rocephin IV  Anti-infectives (From admission, onward)   Start     Dose/Rate Route Frequency Ordered Stop   07/07/19 1330  cefTRIAXone (ROCEPHIN) 2 g in sodium chloride 0.9 % 100 mL IVPB     2 g 200 mL/hr over 30 Minutes Intravenous Every 24 hours 07/07/19 1248     07/07/19 0730  ciprofloxacin (CIPRO) IVPB 400 mg  Status:  Discontinued     400 mg 200 mL/hr over 60 Minutes Intravenous Every 12 hours 07/07/19 0624 07/07/19 1247      Subjective: Today, patient was seen and examined at bedside.  Has any fever, chills or rigor.  Denies any abdominal pain nausea vomiting.  Has expressive dysarthria.  Objective: Vitals:   07/07/19 2044 07/08/19 0510  BP: (!) 159/74 (!) 146/81  Pulse: 78 72  Resp: 20 18  Temp: 99 F (37.2 C) 98.3 F (36.8 C)  SpO2: 99% 94%    Intake/Output Summary (Last 24 hours) at 07/08/2019 1058 Last data filed at 07/08/2019 0909 Gross per 24 hour  Intake 1420.54 ml  Output 1200 ml  Net  220.54 ml   Filed Weights   07/07/19 0634  Weight: 70.3 kg   Body mass index is 22.24 kg/m.   Physical Exam: GENERAL: Patient is alert awake and communicative.  Has expressive dysarthria. HENT: No scleral pallor or icterus. Pupils equally reactive to light. Oral mucosa is moist NECK: is supple, no gross swelling noted. CHEST: Clear to auscultation. No crackles or wheezes.  Diminished breath sounds bilaterally. CVS: S1 and S2 heard, no murmur. Regular rate and rhythm.  ABDOMEN: Soft, non-tender, bowel sounds  are present. EXTREMITIES: No edema. CNS: Expressive dysarthria.  Spastic right hemiplegia.   SKIN: warm and dry without rashes.  Data Review: I have personally reviewed the following laboratory data and studies,  CBC: Recent Labs  Lab 07/07/19 0128 07/08/19 0505  WBC 10.3 4.4  NEUTROABS 8.6*  --   HGB 15.0 12.9  HCT 46.9* 39.2  MCV 93.6 92.7  PLT 201 0000000   Basic Metabolic Panel: Recent Labs  Lab 07/07/19 0124 07/08/19 0505  NA 137 136  K 4.7 3.0*  CL 101 105  CO2 27 24  GLUCOSE 143* 108*  BUN 19 10  CREATININE 0.61 0.42*  CALCIUM 8.8* 8.1*  MG  --  1.9   Liver Function Tests: Recent Labs  Lab 07/07/19 0124  AST 54*  ALT 43  ALKPHOS 86  BILITOT 1.6*  PROT 7.5  ALBUMIN 4.0   Recent Labs  Lab 07/07/19 0124  LIPASE 17   No results for input(s): AMMONIA in the last 168 hours. Cardiac Enzymes: No results for input(s): CKTOTAL, CKMB, CKMBINDEX, TROPONINI in the last 168 hours. BNP (last 3 results) No results for input(s): BNP in the last 8760 hours.  ProBNP (last 3 results) No results for input(s): PROBNP in the last 8760 hours.  CBG: No results for input(s): GLUCAP in the last 168 hours. Recent Results (from the past 240 hour(s))  Urine culture     Status: None   Collection Time: 07/07/19  1:27 AM   Specimen: Urine, Random  Result Value Ref Range Status   Specimen Description   Final    URINE, RANDOM Performed at [facility] [address], [place], [place] [address]    Special Requests   Final    NONE Performed at [facility], [place] [address], [place], [place] [address]    Culture   Final    NO GROWTH Performed at [facility], [place] [address] [place], [place] [address]    Report Status 07/08/2019 FINAL  Final  Blood culture (routine x 2)     Status: None (Preliminary result)   Collection Time: 07/07/19  1:43 AM   Specimen: BLOOD  Result Value Ref Range Status   Specimen Description   Final     BLOOD LEFT ANTECUBITAL Performed at [facility] [address], [place], [place] [address]    Special Requests   Final    BOTTLES DRAWN AEROBIC AND ANAEROBIC Blood Culture results may not be optimal due to an excessive volume of blood received in culture bottles Performed at [facility] [address] [place], [place] [address]    Culture   Final    NO GROWTH 1 DAY Performed at [facility], [place] [address] [place], [place] [address]    Report Status PENDING  Incomplete  Blood culture (routine x 2)     Status: None (Preliminary result)   Collection Time: 07/07/19  1:43 AM  Specimen: BLOOD  Result Value Ref Range Status   Specimen Description   Final    BLOOD RIGHT HAND Performed at [facility] [address], [place], [place] [address]    Special Requests   Final    BOTTLES DRAWN AEROBIC AND ANAEROBIC Blood Culture adequate volume Performed at [facility] [address], [place], [place] [address]    Culture   Final    NO GROWTH 1 DAY Performed at [facility], [place] [address], [place], [place] [address]    Report Status PENDING  Incomplete  Respiratory Panel by RT PCR (Flu A&B, Covid) - Nasopharyngeal Swab     Status: None   Collection Time: 07/07/19  2:41 AM   Specimen: Nasopharyngeal Swab  Result Value Ref Range Status   SARS Coronavirus 2 by RT PCR NEGATIVE NEGATIVE Final    Comment: (NOTE) SARS-CoV-2 target nucleic acids are NOT DETECTED. The SARS-CoV-2 RNA is generally detectable in upper respiratoy specimens during the acute phase of infection. The lowest concentration of SARS-CoV-2 viral copies this assay can detect is 131 copies/mL. A negative result does not preclude SARS-Cov-2 infection and should not be used as the sole basis for treatment or other patient management decisions. A negative result may occur with  improper specimen collection/handling, submission of  specimen other than nasopharyngeal swab, presence of viral mutation(s) within the areas targeted by this assay, and inadequate number of viral copies (<131 copies/mL). A negative result must be combined with clinical observations, patient history, and epidemiological information. The expected result is Negative. Fact Sheet for Patients:  PinkCheek.be Fact Sheet for Healthcare Providers:  GravelBags.it This test is not yet ap proved or cleared by the [place] FDA and  has been authorized for detection and/or diagnosis of SARS-CoV-2 by FDA under an Emergency Use Authorization (EUA). This EUA will remain  in effect (meaning this test can be used) for the duration of the COVID-19 declaration under Section 564(b)(1) of the Act, 21 U.S.C. section 360bbb-3(b)(1), unless the authorization is terminated or revoked sooner.    Influenza A by PCR NEGATIVE NEGATIVE Final   Influenza B by PCR NEGATIVE NEGATIVE Final    Comment: (NOTE) The Xpert Xpress SARS-CoV-2/FLU/RSV assay is intended as an aid in  the diagnosis of influenza from Nasopharyngeal swab specimens and  should not be used as a sole basis for treatment. Nasal washings and  aspirates are unacceptable for Xpert Xpress SARS-CoV-2/FLU/RSV  testing. Fact Sheet for Patients: PinkCheek.be Fact Sheet for Healthcare Providers: GravelBags.it This test is not yet approved or cleared by the [place] FDA and  has been authorized for detection and/or diagnosis of SARS-CoV-2 by  FDA under an Emergency Use Authorization (EUA). This EUA will remain  in effect (meaning this test can be used) for the duration of the  Covid-19 declaration under Section 564(b)(1) of the Act, 21  U.S.C. section 360bbb-3(b)(1), unless the authorization is  terminated or revoked. Performed at [facility], [place] [address], [place], [place] [address]      Studies: CT ABDOMEN PELVIS W CONTRAST  Result Date: 07/07/2019 CLINICAL DATA:  Fever with lower abdominal pain EXAM: CT ABDOMEN AND PELVIS WITH CONTRAST TECHNIQUE: Multidetector CT imaging of the abdomen and pelvis was performed using the standard protocol following bolus administration of intravenous contrast. CONTRAST:  159mL OMNIPAQUE IOHEXOL 300 MG/ML  SOLN COMPARISON:  03/16/2019 FINDINGS: Lower chest: Extensive coronary atherosclerosis. Septal thickening at both lung bases with patchy  atelectasis. Hepatobiliary: Tiny low-density in the inferior right liver that is too small for densitometry.Cholelithiasis without signs of cholecystitis. Pancreas: Generalized atrophy Spleen: Unremarkable. Adrenals/Urinary Tract: Negative adrenals. Lobulated right renal cortex from scarring. Also on the right is multiple stones clustered at the lower pole and measuring up to 4 mm individually. No hydronephrosis or ureteral stone. The urothelium at the right renal pelvis appears thickened and indistinct diffusely. Unremarkable bladder. Stomach/Bowel: No obstruction. Extensive distal colonic diverticulosis without diverticulitis. No visualized appendix. No pericecal inflammation. Vascular/Lymphatic: No acute vascular abnormality. Diffuse atherosclerotic calcification. No mass or adenopathy. Reproductive:Hysterectomy Other: No ascites or pneumoperitoneum. Fatty high left lumbar hernia. Musculoskeletal: No acute abnormalities. Advanced spinal degeneration with lumbar scoliosis. IMPRESSION: 1. Urothelial thickening at the right renal pelvis where there are multiple calculi and cortical scarring, are there symptoms of urinary tract infection? 2. Colonic diverticulosis without visible diverticulitis. 3. Septal thickening at the lung bases consistent with mild edema. 4. Fatty high left lumbar hernia. 5.  Aortic Atherosclerosis (ICD10-I70.0). 6. Cholelithiasis. Electronically Signed   By: Monte Fantasia M.D.   On: 07/07/2019 04:11   DG Chest Port 1 View  Result Date: 07/07/2019 CLINICAL DATA:  Fever, tachypnea EXAM: PORTABLE CHEST 1 VIEW COMPARISON:  Radiograph 12/25/2015 FINDINGS: Chronically coarsened interstitial changes and bandlike area of scarring in the left lung base. No consolidation, features of edema, pneumothorax, or effusion. Postsurgical changes related to prior CABG including intact and aligned sternotomy wires and multiple surgical clips projecting over the mediastinum. The aorta is calcified. The remaining cardiomediastinal contours are unremarkable. IMPRESSION: Chronic changes as above.  No acute cardiopulmonary abnormality. Electronically Signed   By: Lovena Le M.D.   On: 07/07/2019 02:23   ECHOCARDIOGRAM COMPLETE  Result Date: 07/07/2019    ECHOCARDIOGRAM REPORT   Patient Name:   JOHANY BOGDANSKI Date of Exam: 07/07/2019 Medical Rec #:  [id number]  Height:       70.0 in Accession #:    [id number] Weight:       155.0 lb Date of Birth:  1940-11-21   BSA:          1.873 m Patient Age:    1 years   BP:           132/73 mmHg Patient Gender: F          HR:           89 bpm. Exam Location:  Inpatient Procedure: 2D Echo Indications:    Murmur 785.2 / R01.1  History:        Patient has prior history of Echocardiogram examinations, most                 recent 12/10/2013. Prior CABG, Stroke, Signs/Symptoms:Chest Pain;                 Risk Factors:Hypertension and Dyslipidemia. Acute kidney injury.  Sonographer:    Vikki Ports Turrentine Referring Phys: 40 MICHAEL E NORINS  Sonographer Comments: Patient skin sensitivty to pedoff probe. IMPRESSIONS  1. Left ventricular ejection fraction, by estimation, is 60 to 65%. The left ventricle has normal function. The left ventricle has no regional wall motion abnormalities. Left ventricular diastolic parameters are indeterminate.  2. Right ventricular systolic function is normal. The right ventricular size is normal. There is mildly elevated pulmonary artery  systolic pressure. The estimated right ventricular systolic pressure is [id number] mmHg.  3. The mitral valve is normal in structure. No evidence of mitral valve regurgitation.  4. The aortic valve was not  well visualized. Aortic valve regurgitation is not visualized. Mild aortic valve stenosis. Vmax 2.2 m/s, MG 12 mmHg, AVA 1.6 cm^2, DI 0.6  5. The inferior vena cava is normal in size with greater than 50% respiratory variability, suggesting right atrial pressure of 3 mmHg. FINDINGS  Left Ventricle: Left ventricular ejection fraction, by estimation, is 60 to 65%. The left ventricle has normal function. The left ventricle has no regional wall motion abnormalities. The left ventricular internal cavity size was normal in size. There is  no left ventricular hypertrophy. Left ventricular diastolic parameters are indeterminate. Right Ventricle: The right ventricular size is normal. No increase in right ventricular wall thickness. Right ventricular systolic function is normal. There is mildly elevated pulmonary artery systolic pressure. The tricuspid regurgitant velocity is 2.71  m/s, and with an assumed right atrial pressure of 3 mmHg, the estimated right ventricular systolic pressure is [id number] mmHg. Left Atrium: Left atrial size was normal in size. Right Atrium: Right atrial size was normal in size. Pericardium: There is no evidence of pericardial effusion. Mitral Valve: The mitral valve is normal in structure. No evidence of mitral valve regurgitation. Tricuspid Valve: The tricuspid valve is normal in structure. Tricuspid valve regurgitation is trivial. Aortic Valve: The aortic valve was not well visualized. Aortic valve regurgitation is not visualized. Mild aortic stenosis is present. Aortic valve mean gradient measures 10.2 mmHg. Aortic valve peak gradient measures 17.6 mmHg. Aortic valve area, by VTI  measures 1.66 cm. Pulmonic Valve: The pulmonic valve was not well visualized. Pulmonic valve regurgitation is not  visualized. Aorta: The aortic root is normal in size and structure. Venous: The inferior vena cava is normal in size with greater than 50% respiratory variability, suggesting right atrial pressure of 3 mmHg. IAS/Shunts: The interatrial septum was not well visualized.  LEFT VENTRICLE PLAX 2D LVIDd:         4.30 cm  Diastology LVIDs:         2.90 cm  LV e' lateral:   12.20 cm/s LV PW:         0.90 cm  LV E/e' lateral: 10.7 LV IVS:        0.90 cm  LV e' medial:    5.98 cm/s LVOT diam:     1.80 cm  LV E/e' medial:  21.7 LV SV:         66 LV SV Index:   35 LVOT Area:     2.54 cm  RIGHT VENTRICLE RV S prime:     10.10 cm/s TAPSE (M-mode): 1.2 cm LEFT ATRIUM             Index       RIGHT ATRIUM           Index LA diam:        4.00 cm 2.14 cm/m  RA Area:     12.60 cm LA Vol (A2C):   38.0 ml 20.29 ml/m RA Volume:   27.00 ml  14.42 ml/m LA Vol (A4C):   54.8 ml 29.26 ml/m LA Biplane Vol: 45.8 ml 24.45 ml/m  AORTIC VALVE AV Area (Vmax):    1.49 cm AV Area (Vmean):   1.59 cm AV Area (VTI):     1.66 cm AV Vmax:           209.80 cm/s AV Vmean:          148.800 cm/s AV VTI:            0.400 m AV Peak Grad:  17.6 mmHg AV Mean Grad:      10.2 mmHg LVOT Vmax:         123.00 cm/s LVOT Vmean:        92.800 cm/s LVOT VTI:          0.261 m LVOT/AV VTI ratio: 0.65  AORTA Ao Root diam: 3.10 cm MITRAL VALVE                TRICUSPID VALVE MV Area (PHT): 3.63 cm     TR Peak grad:   29.4 mmHg MV Decel Time: 209 msec     TR Vmax:        271.00 cm/s MV E velocity: 130.00 cm/s MV A velocity: 133.00 cm/s  SHUNTS MV E/A ratio:  0.98         Systemic VTI:  0.26 m                             Systemic Diam: 1.80 cm Oswaldo Milian MD Electronically signed by Oswaldo Milian MD Signature Date/Time: 07/07/2019/5:01:48 PM    Final       Flora Lipps, MD  [facility] 07/08/2019

## 2019-07-08 NOTE — TOC Initial Note (Addendum)
Transition of Care [facility]) - Initial/Assessment Note    Patient Details  Name: Kristin [name] MRN: [id number] Date of Birth: March 05, 1941  [facility] CM/SW Contact:    Lia Hopping, [place] Phone Number: 07/08/2019, 10:10 AM  Clinical Narrative:                 Daughter [name] returned call. She reports the plan is for the patient to return to [facility], at discharge. The patient is apart of the [facility] and receives PT/OT through [employer] group. Daughter reports the patient is independent with her ADL's. She can bathe, dress and toilet herself. Daughter states, "on paper it may not appear she cannot care for herself, but she is independent." Daughter reports the patient spouse died last week and they plan to have the funeral tomorrow. The family would like for the patient to attend if medically stable.  Per nurse, the patient is still receiving IV antibiotics.   TOC staff will continue to follow for discharge needs.  Family will transport the patient at discharge.   Expected Discharge Plan: Independent/Assisted Living Barriers to Discharge: Continued Medical Work up   Patient Goals and CMS Choice     Choice offered to / list presented to : NA  Expected Discharge Plan and Services Expected Discharge Plan: Independent/Assisted Living In-house Referral: Clinical Social Work Discharge Planning Services: NA Post Acute Care Choice: NA Living arrangements for the past 2 months: [place]                 DME Arranged: N/A DME Agency: NA                  Prior Living Arrangements/Services Living arrangements for the past 2 months: Independent/Assisted Living Facility Lives with:: Self Patient language and need for interpreter reviewed:: No Do you feel safe going back to the place where you live?: Yes      Need for Family Participation in Patient Care: Yes (Comment) Care giver support system in place?: Yes  (comment) Current home services: Home OT, Home PT, DME, Homehealth aide Criminal Activity/Legal Involvement Pertinent to Current Situation/Hospitalization: No - Comment as needed  Activities of Daily Living Home Assistive Devices/Equipment: Transport planner, Environmental consultant (specify type), Built-in shower seat, Other (Comment)(front wheeled walker, walk-in shower) ADL Screening (condition at time of admission) Patient's cognitive ability adequate to safely complete daily activities?: Yes Is the patient deaf or have difficulty hearing?: No Does the patient have difficulty seeing, even when wearing glasses/contacts?: No Does the patient have difficulty concentrating, remembering, or making decisions?: Yes Patient able to express need for assistance with ADLs?: Yes Does the patient have difficulty dressing or bathing?: No Independently performs ADLs?: No Communication: Independent Dressing (OT): Independent Grooming: Independent Feeding: Independent Bathing: Independent Toileting: Independent In/Out Bed: Independent Walks in Home: Independent with device (comment) Does the patient have difficulty walking or climbing stairs?: Yes Weakness of Legs: Right Weakness of Arms/Hands: Right  Permission Sought/Granted      Share Information with NAME: Tor Netters  Permission granted to share info w AGENCY: [facility]  Permission granted to share info w Relationship: Daughter  Permission granted to share info w Contact Information: [phone]  Emotional Assessment Appearance:: Appears stated age   Affect (typically observed): Calm Orientation: : Oriented to Self, Oriented to Place, Oriented to  Time, Oriented to Situation Alcohol / Substance Use: Not Applicable Psych Involvement: No (comment)  Admission diagnosis:  Acute UTI [N39.0] Acute febrile illness [R50.9]  Patient Active Problem List   Diagnosis Date Noted  . Fever 07/07/2019  . Acute costochondritis 07/07/2019  . Acute UTI  07/07/2019  . Nephrolithiasis 02/24/2017  . Acute lower UTI 02/24/2017  . Colitis 02/24/2017  . Hypokalemia 02/24/2017  . AKI (acute kidney injury) ([place]) 02/24/2017  . Acute kidney injury ([place]) 02/24/2017  . Acute cystitis with hematuria   . History of stroke with residual deficit 02/04/2017  . Female stress incontinence 01/09/2017  . Ureteral calculus, right 01/09/2017  . Nocturia 12/26/2016  . Impacted cerumen of left ear 11/07/2016  . Left ear pain 11/07/2016  . Urinary urgency 08/20/2016  . Chest pain syndrome   . Chest pain 10/17/2015  . IDA (iron deficiency anemia) 10/04/2015  . Right leg swelling 08/14/2015  . Right rotator cuff tendinitis 08/14/2015  . Biceps tendonitis on left 03/13/2015  . Injury of left rotator cuff 08/17/2014  . Shoulder subluxation, right 07/06/2014  . S/P CABG x 2 12/17/2013  . Essential hypertension   . Unstable angina ([place]) 12/09/2013  . Atherosclerotic heart disease of native coronary artery with unstable angina pectoris: H/o PCI to RCA; Ostial LAD &dRCA severe stenosis. 12/09/2013  . Dyslipidemia, goal LDL below 70 12/09/2013  . Prolonged QT interval 12/09/2013  . Open wound of scalp, without mention of complication [id number]  . CVA 2001 06/01/2012  . Unilateral primary osteoarthritis, right knee 06/01/2012  . Lumbago 06/01/2012  . Spastic hemiplegia affecting dominant side ([place]) 06/01/2012  . Aphasia as late effect of stroke 06/01/2012   PCP:  Gayland Curry, DO Pharmacy:   [facility], [place] [address] Phone: [phone] Fax: [phone]     Social Determinants of Health (SDOH) Interventions    Readmission Risk Interventions No flowsheet data found.

## 2019-07-08 NOTE — Discharge Summary (Signed)
Physician Discharge Summary  Kristin [name] [id number] DOB: 1941/01/12 DOA: 07/07/2019  PCP: Gayland Curry, DO  Admit date: 07/07/2019 Discharge date: 07/08/2019  Admitted From: ALF  Discharge disposition: ALF   Recommendations for Outpatient Follow-Up:   . Follow up with your primary care provider in one week.  . Check CBC, BMP in 3-5 days, potassium was slightly low prior to discharge. . Continue the course of antibiotic on discharge. . Follow-up with urology for kidney stones.  Discharge Diagnosis:   Active Problems:   Spastic hemiplegia affecting dominant side (HCC)   Aphasia as late effect of stroke   Essential hypertension   Nephrolithiasis   Acute lower UTI   Fever   Acute costochondritis   Acute UTI   Discharge Condition: Improved.  Diet recommendation: Low sodium, heart healthy.  Dysphagia 3 diet.  Wound care: None.  Code status: Full.   History of Present Illness:  Kristin [name] a 79 y.o.femalewith medical history significant ofCVA with residual right hemiplegia and expressive aphasia, CAD s/p PCI/Stenting, s/p CABG, HTN, Diploplia, CKD, nephrolithiasis presented to the hospital with abdominal pain and fever.  Patient did have prior history of UTI and nephrolithiasis and had received Macrobid in the past. In the ED, On arrival she was tachycardic, tachypneic and had fever to 104.3.  Lab revealed normal WBC with minor left shift with 84% segs. Lipase 17. U/A positive for blood and >50 RBC, 5 WBCs, rare bacteria. CXR negative for active disease. EKG with sinus tachy, PVCs, possible left anterior fasicular block. CT positive for cluster of small stones right kidney lower pole w/o ureteral stones. Patient was then admitted hospital for further evaluation and treatment.  Hospital Course:   Following conditions were addressed during hospitalization as listed below,  Acute lower complicated UTI.  With nephrolithiasis. Incompletely treated as outpatient.  History of Klebsiella infection.    Patient was given IV rocephin.  Will continue Omnicef for the next 5 days to complete total of 7-day course of antibiotics.  Urine culture with no growth so far.  Blood culture negative for growth in less than 1 day.   Patient was initially febrile but has remained afebrile over the last 30 hours.   Received gentle IV fluids.  No leukocytosis.  Hypokalemia.  Potassium 3.0.  Patient was given a 40 mEq of potassium in the morning today and will give additional 40 meq prior to discharge.  Patient will be given prescription for potassium for the next 5 days.  Recommend checking potassium levels in 3 to 5 days.  CVA with residual right-sided hemiplegia and expressive aphasia.  Continue aspirin, Lipitor Plavix.  Stable at this time.  Coronary artery disease status post PCI stent, status post CABG, essential hypertension.  On dysphagia 3 diet. Continue aspirin, Lipitor Plavix, losartan.  No acute issues.  History of nephrolithiasis. CT scan with right renal pelvis calculi and cortical scarring.  Will need to follow-up with urology as outpatient.  Disposition.  At this time, patient is stable for disposition to assisted living facility.  Patient's husband's funeral is scheduled for 07/09/2019 and at this time, the family has strongly requested for discharge.  Spoke with Dr. Carlean Purl, who is a relative of the patient regarding disposition of the patient.  Medical Consultants:    None  Procedures:    None  Subjective:   Today, patient denies any abdominal pain, nausea or vomiting.  Has expressive dysarthria.  No fever noted over the last 30 hours or so.  Discharge Exam:   Vitals:   07/08/19 0510 07/08/19 1337  BP: (!) 146/81 129/64  Pulse: 72 67  Resp: 18 17  Temp: 98.3 F (36.8 C) 98.6 F (37 C)  SpO2: 94% 92%   Vitals:   07/07/19 1410 07/07/19 2044 07/08/19 0510 07/08/19 1337  BP: (!) 139/93 (!) 159/74 (!) 146/81 129/64  Pulse: 83 78 72  67  Resp: 18 20 18 17   Temp: 100.1 F (37.8 C) 99 F (37.2 C) 98.3 F (36.8 C) 98.6 F (37 C)  TempSrc: Oral Oral Oral   SpO2:  99% 94% 92%  Weight:      Height:       GENERAL: Patient is alert awake and communicative.  Has expressive dysarthria. HENT: No scleral pallor or icterus. Pupils equally reactive to light. Oral mucosa is moist NECK: is supple, no gross swelling noted. CHEST: Clear to auscultation. No crackles or wheezes.  Diminished breath sounds bilaterally. CVS: S1 and S2 heard, no murmur. Regular rate and rhythm.  ABDOMEN: Soft, non-tender, bowel sounds are present. EXTREMITIES: No edema. CNS: Expressive dysarthria.  Spastic right hemiplegia.   SKIN: warm and dry without rashes.   The results of significant diagnostics from this hospitalization (including imaging, microbiology, ancillary and laboratory) are listed below for reference.     Diagnostic Studies:   CT ABDOMEN PELVIS W CONTRAST  Result Date: 07/07/2019 CLINICAL DATA:  Fever with lower abdominal pain EXAM: CT ABDOMEN AND PELVIS WITH CONTRAST TECHNIQUE: Multidetector CT imaging of the abdomen and pelvis was performed using the standard protocol following bolus administration of intravenous contrast. CONTRAST:  174mL OMNIPAQUE IOHEXOL 300 MG/ML  SOLN COMPARISON:  03/16/2019 FINDINGS: Lower chest: Extensive coronary atherosclerosis. Septal thickening at both lung bases with patchy atelectasis. Hepatobiliary: Tiny low-density in the inferior right liver that is too small for densitometry.Cholelithiasis without signs of cholecystitis. Pancreas: Generalized atrophy Spleen: Unremarkable. Adrenals/Urinary Tract: Negative adrenals. Lobulated right renal cortex from scarring. Also on the right is multiple stones clustered at the lower pole and measuring up to 4 mm individually. No hydronephrosis or ureteral stone. The urothelium at the right renal pelvis appears thickened and indistinct diffusely. Unremarkable bladder.  Stomach/Bowel: No obstruction. Extensive distal colonic diverticulosis without diverticulitis. No visualized appendix. No pericecal inflammation. Vascular/Lymphatic: No acute vascular abnormality. Diffuse atherosclerotic calcification. No mass or adenopathy. Reproductive:Hysterectomy Other: No ascites or pneumoperitoneum. Fatty high left lumbar hernia. Musculoskeletal: No acute abnormalities. Advanced spinal degeneration with lumbar scoliosis. IMPRESSION: 1. Urothelial thickening at the right renal pelvis where there are multiple calculi and cortical scarring, are there symptoms of urinary tract infection? 2. Colonic diverticulosis without visible diverticulitis. 3. Septal thickening at the lung bases consistent with mild edema. 4. Fatty high left lumbar hernia. 5.  Aortic Atherosclerosis (ICD10-I70.0). 6. Cholelithiasis. Electronically Signed   By: Monte Fantasia M.D.   On: 07/07/2019 04:11   DG Chest Port 1 View  Result Date: 07/07/2019 CLINICAL DATA:  Fever, tachypnea EXAM: PORTABLE CHEST 1 VIEW COMPARISON:  Radiograph 12/25/2015 FINDINGS: Chronically coarsened interstitial changes and bandlike area of scarring in the left lung base. No consolidation, features of edema, pneumothorax, or effusion. Postsurgical changes related to prior CABG including intact and aligned sternotomy wires and multiple surgical clips projecting over the mediastinum. The aorta is calcified. The remaining cardiomediastinal contours are unremarkable. IMPRESSION: Chronic changes as above.  No acute cardiopulmonary abnormality. Electronically Signed   By: Lovena Le M.D.   On: 07/07/2019 02:23   ECHOCARDIOGRAM COMPLETE  Result Date: 07/07/2019  ECHOCARDIOGRAM REPORT   Patient Name:   ANJANETTE RIPPEY Date of Exam: 07/07/2019 Medical Rec #:  [id number]  Height:       70.0 in Accession #:    [id number] Weight:       155.0 lb Date of Birth:  1940-12-25   BSA:          1.873 m Patient Age:    31 years   BP:           132/73 mmHg Patient  Gender: F          HR:           89 bpm. Exam Location:  Inpatient Procedure: 2D Echo Indications:    Murmur 785.2 / R01.1  History:        Patient has prior history of Echocardiogram examinations, most                 recent 12/10/2013. Prior CABG, Stroke, Signs/Symptoms:Chest Pain;                 Risk Factors:Hypertension and Dyslipidemia. Acute kidney injury.  Sonographer:    Vikki Ports Turrentine Referring Phys: 34 MICHAEL E NORINS  Sonographer Comments: Patient skin sensitivty to pedoff probe. IMPRESSIONS  1. Left ventricular ejection fraction, by estimation, is 60 to 65%. The left ventricle has normal function. The left ventricle has no regional wall motion abnormalities. Left ventricular diastolic parameters are indeterminate.  2. Right ventricular systolic function is normal. The right ventricular size is normal. There is mildly elevated pulmonary artery systolic pressure. The estimated right ventricular systolic pressure is [id number] mmHg.  3. The mitral valve is normal in structure. No evidence of mitral valve regurgitation.  4. The aortic valve was not well visualized. Aortic valve regurgitation is not visualized. Mild aortic valve stenosis. Vmax 2.2 m/s, MG 12 mmHg, AVA 1.6 cm^2, DI 0.6  5. The inferior vena cava is normal in size with greater than 50% respiratory variability, suggesting right atrial pressure of 3 mmHg. FINDINGS  Left Ventricle: Left ventricular ejection fraction, by estimation, is 60 to 65%. The left ventricle has normal function. The left ventricle has no regional wall motion abnormalities. The left ventricular internal cavity size was normal in size. There is  no left ventricular hypertrophy. Left ventricular diastolic parameters are indeterminate. Right Ventricle: The right ventricular size is normal. No increase in right ventricular wall thickness. Right ventricular systolic function is normal. There is mildly elevated pulmonary artery systolic pressure. The tricuspid regurgitant velocity  is 2.71  m/s, and with an assumed right atrial pressure of 3 mmHg, the estimated right ventricular systolic pressure is [id number] mmHg. Left Atrium: Left atrial size was normal in size. Right Atrium: Right atrial size was normal in size. Pericardium: There is no evidence of pericardial effusion. Mitral Valve: The mitral valve is normal in structure. No evidence of mitral valve regurgitation. Tricuspid Valve: The tricuspid valve is normal in structure. Tricuspid valve regurgitation is trivial. Aortic Valve: The aortic valve was not well visualized. Aortic valve regurgitation is not visualized. Mild aortic stenosis is present. Aortic valve mean gradient measures 10.2 mmHg. Aortic valve peak gradient measures 17.6 mmHg. Aortic valve area, by VTI  measures 1.66 cm. Pulmonic Valve: The pulmonic valve was not well visualized. Pulmonic valve regurgitation is not visualized. Aorta: The aortic root is normal in size and structure. Venous: The inferior vena cava is normal in size with greater than 50% respiratory variability, suggesting right atrial pressure of 3 mmHg. IAS/Shunts: The  interatrial septum was not well visualized.  LEFT VENTRICLE PLAX 2D LVIDd:         4.30 cm  Diastology LVIDs:         2.90 cm  LV e' lateral:   12.20 cm/s LV PW:         0.90 cm  LV E/e' lateral: 10.7 LV IVS:        0.90 cm  LV e' medial:    5.98 cm/s LVOT diam:     1.80 cm  LV E/e' medial:  21.7 LV SV:         66 LV SV Index:   35 LVOT Area:     2.54 cm  RIGHT VENTRICLE RV S prime:     10.10 cm/s TAPSE (M-mode): 1.2 cm LEFT ATRIUM             Index       RIGHT ATRIUM           Index LA diam:        4.00 cm 2.14 cm/m  RA Area:     12.60 cm LA Vol (A2C):   38.0 ml 20.29 ml/m RA Volume:   27.00 ml  14.42 ml/m LA Vol (A4C):   54.8 ml 29.26 ml/m LA Biplane Vol: 45.8 ml 24.45 ml/m  AORTIC VALVE AV Area (Vmax):    1.49 cm AV Area (Vmean):   1.59 cm AV Area (VTI):     1.66 cm AV Vmax:           209.80 cm/s AV Vmean:          148.800 cm/s AV VTI:             0.400 m AV Peak Grad:      17.6 mmHg AV Mean Grad:      10.2 mmHg LVOT Vmax:         123.00 cm/s LVOT Vmean:        92.800 cm/s LVOT VTI:          0.261 m LVOT/AV VTI ratio: 0.65  AORTA Ao Root diam: 3.10 cm MITRAL VALVE                TRICUSPID VALVE MV Area (PHT): 3.63 cm     TR Peak grad:   29.4 mmHg MV Decel Time: 209 msec     TR Vmax:        271.00 cm/s MV E velocity: 130.00 cm/s MV A velocity: 133.00 cm/s  SHUNTS MV E/A ratio:  0.98         Systemic VTI:  0.26 m                             Systemic Diam: 1.80 cm Oswaldo Milian MD Electronically signed by Oswaldo Milian MD Signature Date/Time: 07/07/2019/5:01:48 PM    Final      Labs:   Basic Metabolic Panel: Recent Labs  Lab 07/07/19 0124 07/08/19 0505  NA 137 136  K 4.7 3.0*  CL 101 105  CO2 27 24  GLUCOSE 143* 108*  BUN 19 10  CREATININE 0.61 0.42*  CALCIUM 8.8* 8.1*  MG  --  1.9   GFR Estimated Creatinine Clearance: 59.6 mL/min (A) (by C-G formula based on SCr of 0.42 mg/dL (L)). Liver Function Tests: Recent Labs  Lab 07/07/19 0124  AST 54*  ALT 43  ALKPHOS 86  BILITOT 1.6*  PROT 7.5  ALBUMIN 4.0  Recent Labs  Lab 07/07/19 0124  LIPASE 17   No results for input(s): AMMONIA in the last 168 hours. Coagulation profile Recent Labs  Lab 07/07/19 0128  INR 1.0    CBC: Recent Labs  Lab 07/07/19 0128 07/08/19 0505  WBC 10.3 4.4  NEUTROABS 8.6*  --   HGB 15.0 12.9  HCT 46.9* 39.2  MCV 93.6 92.7  PLT 201 162   Cardiac Enzymes: No results for input(s): CKTOTAL, CKMB, CKMBINDEX, TROPONINI in the last 168 hours. BNP: Invalid input(s): POCBNP CBG: No results for input(s): GLUCAP in the last 168 hours. D-Dimer No results for input(s): DDIMER in the last 72 hours. Hgb A1c No results for input(s): HGBA1C in the last 72 hours. Lipid Profile No results for input(s): CHOL, HDL, LDLCALC, TRIG, CHOLHDL, LDLDIRECT in the last 72 hours. Thyroid function studies No results for input(s):  TSH, T4TOTAL, T3FREE, THYROIDAB in the last 72 hours.  Invalid input(s): FREET3 Anemia work up No results for input(s): VITAMINB12, FOLATE, FERRITIN, TIBC, IRON, RETICCTPCT in the last 72 hours. Microbiology Recent Results (from the past 240 hour(s))  Urine culture     Status: None   Collection Time: 07/07/19  1:27 AM   Specimen: Urine, Random  Result Value Ref Range Status   Specimen Description   Final    URINE, RANDOM Performed at [facility] [address] [place], [place] [address]    Special Requests   Final    NONE Performed at [facility], [place] [address] [place], [place] [address]    Culture   Final    NO GROWTH Performed at [facility], [place] [address] [place], [place] [address]    Report Status 07/08/2019 FINAL  Final  Blood culture (routine x 2)     Status: None (Preliminary result)   Collection Time: 07/07/19  1:43 AM   Specimen: BLOOD  Result Value Ref Range Status   Specimen Description   Final    BLOOD LEFT ANTECUBITAL Performed at [facility] [address], [place], [place] [address]    Special Requests   Final    BOTTLES DRAWN AEROBIC AND ANAEROBIC Blood Culture results may not be optimal due to an excessive volume of blood received in culture bottles Performed at [facility] [address] [place], [place] [address]    Culture   Final    NO GROWTH 1 DAY Performed at [facility], [place] [address] [place], [place] [address]    Report Status PENDING  Incomplete  Blood culture (routine x 2)     Status: None (Preliminary result)   Collection Time: 07/07/19  1:43 AM   Specimen: BLOOD  Result Value Ref Range Status   Specimen Description   Final    BLOOD RIGHT HAND Performed at [facility] [address] [place], [place] [address]    Special Requests   Final    BOTTLES DRAWN AEROBIC AND ANAEROBIC Blood Culture adequate volume Performed  at [facility] [address] [place], [place] [address]    Culture   Final    NO GROWTH 1 DAY Performed at [facility], [place] [address], [place], [place] [address]    Report Status PENDING  Incomplete  Respiratory Panel by RT PCR (Flu A&B, Covid) - Nasopharyngeal Swab     Status: None   Collection Time: 07/07/19  2:41 AM   Specimen: Nasopharyngeal Swab  Result Value Ref Range Status   SARS Coronavirus 2 by RT PCR NEGATIVE NEGATIVE Final    Comment: (NOTE) SARS-CoV-2 target nucleic acids are NOT DETECTED. The SARS-CoV-2 RNA is generally detectable in upper respiratoy specimens during the acute phase of infection. The lowest concentration of SARS-CoV-2 viral copies this assay can detect is 131 copies/mL. A negative result does not preclude SARS-Cov-2 infection and should not be used as the sole basis for treatment or other patient management decisions. A negative result may occur with  improper specimen collection/handling, submission of specimen other than nasopharyngeal swab, presence of viral mutation(s) within the areas targeted by this assay, and inadequate number of viral copies (<131 copies/mL). A negative result must be combined with clinical observations, patient history, and epidemiological information. The expected result is Negative. Fact Sheet for Patients:  PinkCheek.be Fact Sheet for Healthcare Providers:  GravelBags.it This test is not yet ap proved or cleared by the [place] FDA and  has been authorized for detection and/or diagnosis of SARS-CoV-2 by FDA under an Emergency Use Authorization (EUA). This EUA will remain  in effect (meaning this test can be used) for the duration of the COVID-19 declaration under Section 564(b)(1) of the Act, 21 U.S.C. section 360bbb-3(b)(1), unless the authorization is terminated or revoked sooner.    Influenza A by PCR NEGATIVE NEGATIVE Final    Influenza B by PCR NEGATIVE NEGATIVE Final    Comment: (NOTE) The Xpert Xpress SARS-CoV-2/FLU/RSV assay is intended as an aid in  the diagnosis of influenza from Nasopharyngeal swab specimens and  should not be used as a sole basis for treatment. Nasal washings and  aspirates are unacceptable for Xpert Xpress SARS-CoV-2/FLU/RSV  testing. Fact Sheet for Patients: PinkCheek.be Fact Sheet for Healthcare Providers: GravelBags.it This test is not yet approved or cleared by the [place] FDA and  has been authorized for detection and/or diagnosis of SARS-CoV-2 by  FDA under an Emergency Use Authorization (EUA). This EUA will remain  in effect (meaning this test can be used) for the duration of the  Covid-19 declaration under Section 564(b)(1) of the Act, 21  U.S.C. section 360bbb-3(b)(1), unless the authorization is  terminated or revoked. Performed at [facility], [place] [address], [place], [place] [address]      Discharge Instructions:   Discharge Instructions    Diet - low sodium heart healthy   Complete by: As directed    Dysphagia III diet   Discharge instructions   Complete by: As directed    Follow-up with your primary care physician within 1 week.  Complete the course of antibiotic.  Please take potassium as prescribed.  You will need to check your blood work in 3 to 5 days.   Increase activity slowly   Complete by: As directed      Allergies as of 07/08/2019   No Known Allergies     Medication List    STOP taking these medications   nitrofurantoin (macrocrystal-monohydrate) 100 MG capsule Commonly known as: MACROBID     TAKE these medications   aspirin 81 MG tablet Take 81 mg by mouth daily.   atorvastatin 20 MG tablet Commonly known as: LIPITOR TAKE 1 TABLET EACH DAY. What changed: See the new instructions.   cefdinir 300 MG capsule Commonly known as: OMNICEF Take 1 capsule  (300 mg total) by mouth 2 (two) times daily for 5 days.   clopidogrel 75 MG tablet Commonly known as: PLAVIX TAKE 1 TABLET EACH DAY. What changed:  See the new instructions.   COQ-10 PO Take 1 tablet daily by mouth.   desmopressin 0.2 MG tablet Commonly known as: DDAVP Take 0.2 mg by mouth at bedtime.   diclofenac sodium 1 % Gel Commonly known as: VOLTAREN Apply 4 g topically 4 (four) times daily as needed (pain).   DULoxetine 20 MG capsule Commonly known as: Cymbalta Take 1 capsule (20 mg total) by mouth daily.   gabapentin 300 MG capsule Commonly known as: Neurontin Take 1 capsule (300 mg total) by mouth 3 (three) times daily.   losartan 25 MG tablet Commonly known as: COZAAR TAKE 1 TABLET ONCE DAILY. What changed: when to take this   mirabegron ER 50 MG Tb24 tablet Commonly known as: MYRBETRIQ Take 50 mg by mouth daily.   nitroGLYCERIN 0.4 MG SL tablet Commonly known as: Nitrostat Place 1 tablet (0.4 mg total) under the tongue every 5 (five) minutes as needed for chest pain.   omeprazole 20 MG capsule Commonly known as: PRILOSEC TAKE 1 CAPSULE EVERY DAY. What changed: when to take this   TURMERIC PO Take 1 tablet by mouth daily.        Time coordinating discharge: 39 minutes  Signed:  Patrik Turnbaugh  [facility] 07/08/2019, 3:07 PM

## 2019-07-08 NOTE — TOC Transition Note (Signed)
Transition of Care [facility]) - CM/SW Discharge Note   Patient Details  Name: Kristin [name] MRN: [id number] Date of Birth: 13-Dec-1940  Transition of [facility] CM/SW Contact:  Lia Hopping, [place] Phone Number: 07/08/2019, 3:25 PM   Clinical Narrative:    Patient will return to her Independent Living today. CSW faxed Home Health PT/OT orders to facility Rep. Regina [phone]. Family will transport the patient.   Final next level of care: Independent/Assisted Living Barriers to Discharge: Barriers Resolved   Patient Goals and CMS Choice     Choice offered to / list presented to : NA  Discharge Placement                       Discharge Plan and Services In-house Referral: Clinical Social Work Discharge Planning Services: NA Post Acute Care Choice: NA          DME Arranged: N/A DME Agency: NA                  Social Determinants of Health (SDOH) Interventions     Readmission Risk Interventions No flowsheet data found.

## 2019-07-08 NOTE — Progress Notes (Signed)
Assessment unchanged. Pt nodded understanding and daughter verbalized understanding of dc instructions. pt discharged via wc to [facility] entrance accompanied NT and daughter.

## 2019-07-12 ENCOUNTER — Telehealth: Payer: Self-pay | Admitting: *Deleted

## 2019-07-12 LAB — CULTURE, BLOOD (ROUTINE X 2)
Culture: NO GROWTH
Culture: NO GROWTH
Special Requests: ADEQUATE

## 2019-07-12 NOTE — Telephone Encounter (Signed)
I have made the 1st attempt to contact the patient or family member in charge, in order to follow up from recently being discharged from the hospital. I left a message on voicemail but I will make another attempt at a different time.  

## 2019-07-13 NOTE — Telephone Encounter (Signed)
I have made the 2nd attempt to contact the patient or family member in charge, in order to follow up from recently being discharged from the hospital. I left a message on voicemail but I will make another attempt at a different time.  

## 2019-07-14 NOTE — Telephone Encounter (Signed)
Step-daughter [name] called and scheduled appt, she states it 's too late for [facility] call, I advised that Rodena Piety has tried to call twice.

## 2019-07-20 ENCOUNTER — Encounter: Payer: Self-pay | Admitting: Family

## 2019-07-20 ENCOUNTER — Other Ambulatory Visit: Payer: Self-pay

## 2019-07-20 ENCOUNTER — Ambulatory Visit (INDEPENDENT_AMBULATORY_CARE_PROVIDER_SITE_OTHER): Payer: Medicare PPO | Admitting: Family

## 2019-07-20 VITALS — BP 112/70 | HR 69 | Temp 97.9°F | Ht 66.0 in | Wt 156.0 lb

## 2019-07-20 DIAGNOSIS — I10 Essential (primary) hypertension: Secondary | ICD-10-CM

## 2019-07-20 DIAGNOSIS — G8110 Spastic hemiplegia affecting unspecified side: Secondary | ICD-10-CM | POA: Diagnosis not present

## 2019-07-20 DIAGNOSIS — H6122 Impacted cerumen, left ear: Secondary | ICD-10-CM

## 2019-07-20 DIAGNOSIS — I69320 Aphasia following cerebral infarction: Secondary | ICD-10-CM | POA: Diagnosis not present

## 2019-07-20 DIAGNOSIS — E876 Hypokalemia: Secondary | ICD-10-CM | POA: Diagnosis not present

## 2019-07-20 NOTE — Patient Instructions (Signed)
-  lab work drawn today will call with results.  - continue current medication

## 2019-07-20 NOTE — Progress Notes (Addendum)
Provider: Genni Buske FNP-C  Gayland Curry, DO  Patient Care Team: Gayland Curry, DO as PCP - General (Geriatric Medicine) Armbruster, Carlota Raspberry, MD as Consulting Physician (Gastroenterology) Meredith Staggers, MD as Consulting Physician (Physical Medicine and Rehabilitation) Register, Luetta Nutting, PA-C as Physician Assistant (Dermatology) Marzetta Board, DPM as Consulting Physician (Podiatry) Hayden Pedro, MD as Consulting Physician (Ophthalmology) Domingo Pulse, MD (Urology)  Extended Emergency Contact Information Primary Emergency Contact: Adah Salvage, [place] [address] Johnnette Litter of [place] Phone: [phone] Relation: Daughter Secondary Emergency Contact: Jonna Munro [place] of [place] Mobile Phone: [phone] Relation: Relative  Code Status:  DNR Goals of care: Advanced Directive information Advanced Directives 07/07/2019  Does Patient Have a Medical Advance Directive? Yes  Type of Paramedic of [place];Living will  Does patient want to make changes to medical advance directive? No - Patient declined  Copy of [place] in Chart? No - copy requested  Would patient like information on creating a medical advance directive? -  Pre-existing out of facility DNR order (yellow form or pink MOST form) -     Chief Complaint  Patient presents with  . Hospitalization Follow-up    discharged 07/08/2019    HPI:  Pt is a 79 y.o. female seen today for an acute visit for hospital follow up.she is status post hospitalization 07/07/2019 - 07/08/2019 for abdominal pain and fever.she was tachycardic,tachypnenic and temp 104.3.Her labs showed no leukocytosis,lipase 17,K+ and urine analysis showed large hgb,ketones5,protein 30,> 50 RBC and rare bacteria.Urine culture was negative.she was treated with I.V Rocephin.I.V. 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 She denies any acute issues during visit. Daughter states patient was seen by Urologist Dr.Larocco Staci Acosta 07/13/2019 over the counter cranberry tablet recommended to prevent UTI's.Trimethoprim 100 mg tablet daily was restarted.  She resides at [employer] apartment.Daughter visit often.   Past Medical History:  Diagnosis Date  . Arthritis    hands  . CAD (coronary artery disease)    a. 2010: stents pRCA and dRCA. b. LHC 12/13/13: Minimal distal LM dz. LAD 95% ostial w/ L-->R collat. LCx mild dz. RCA 95% mid-dRCA. Patent stents proximal and distal RCA. EF not assessed, Nl by nuclear study.     . Central retinal vein occlusion, right eye   . Chronic kidney disease    hx kidney stone  . Depression   . Diplopia   . Ear pain    right  . Eosinophilic esophagitis   . Essential hypertension   . GERD (gastroesophageal reflux disease)   . H/O: whooping cough   . Hyperlipidemia with target LDL less than 70   . Incontinence    urine  . Stroke [facility]) 2001   reesultant Rt spastic hemplegia and aphasia    Past  Surgical History:  Procedure Laterality Date  . ABDOMINAL HYSTERECTOMY    . CAROTID STENT    . cataracts    . COLONOSCOPY N/A 10/05/2015   Procedure: COLONOSCOPY;  Surgeon: Manus Gunning, MD;  Location: [facility];  Service: Gastroenterology;  Laterality: N/A;  . CORONARY ARTERY BYPASS GRAFT N/A 12/17/2013   Procedure: CORONARY ARTERY BYPASS GRAFTING (CABG);  Surgeon: Gaye Pollack, MD;  Location: [facility];  Service: Open Heart Surgery;  Laterality: N/A;  Times 2 using left internal mammary artery and endoscopically harvested right saphenous vein  . ESOPHAGOGASTRODUODENOSCOPY    . ESOPHAGOGASTRODUODENOSCOPY N/A 10/05/2015   Procedure: ESOPHAGOGASTRODUODENOSCOPY (EGD);  Surgeon: Manus Gunning, MD;  Location: [facility];  Service: Gastroenterology;  Laterality: N/A;  . INTRAOPERATIVE TRANSESOPHAGEAL ECHOCARDIOGRAM N/A 12/17/2013   Procedure: INTRAOPERATIVE TRANSESOPHAGEAL ECHOCARDIOGRAM;  Surgeon: Gaye Pollack, MD;  Location: [facility];  Service: Open Heart Surgery;  Laterality: N/A;  . lasix    . LEFT HEART CATHETERIZATION WITH CORONARY ANGIOGRAM N/A 12/13/2013   Procedure: LEFT HEART CATHETERIZATION WITH CORONARY ANGIOGRAM;  Surgeon: Jettie Booze, MD;  [facility] C[facility] LAB;  Service: Cardiovascular;  Laterality: N/A;  . retina injection      No Known Allergies  Outpatient Encounter Medications as of 07/20/2019  Medication Sig  . aspirin 81 MG tablet Take 81 mg by mouth daily.  Marland Kitchen atorvastatin (LIPITOR) 20 MG tablet TAKE 1 TABLET EACH DAY.  Marland Kitchen clopidogrel (PLAVIX) 75 MG tablet TAKE 1 TABLET EACH DAY.  Marland Kitchen Coenzyme Q10 (COQ-10 PO) Take 1 tablet daily by mouth.  . desmopressin (DDAVP) 0.2 MG tablet Take 0.2 mg by mouth at bedtime.   . diclofenac sodium (VOLTAREN) 1 % GEL Apply 4 g topically 4 (four) times daily as needed (pain).   . DULoxetine (CYMBALTA) 20 MG capsule Take 1 capsule (20 mg total) by mouth daily.  Marland Kitchen gabapentin (NEURONTIN) 300 MG capsule Take 1 capsule (300 mg total) by mouth 3 (three) times daily.  Marland Kitchen losartan (COZAAR) 25 MG tablet TAKE 1 TABLET ONCE DAILY.  . mirabegron ER (MYRBETRIQ) 50 MG TB24 tablet Take 50 mg by mouth daily.   . nitroGLYCERIN (NITROSTAT) 0.4 MG SL  tablet Place 1 tablet (0.4 mg total) under the tongue every 5 (five) minutes as needed for chest pain.  Marland Kitchen omeprazole (PRILOSEC) 20 MG capsule TAKE 1 CAPSULE EVERY DAY.  Marland Kitchen trimethoprim (TRIMPEX) 100 MG tablet Take 1 tablet by mouth daily.  . TURMERIC PO Take 1 tablet by mouth daily.    No facility-administered encounter medications on file as of 07/20/2019.    Review of Systems  Constitutional: Negative for appetite change, chills, fatigue and fever.  HENT: Negative for congestion, postnasal drip, rhinorrhea, sinus pressure, sinus pain, sneezing, sore throat and trouble swallowing.        Aphasia   Eyes: Negative for discharge, redness, itching and visual disturbance.  Respiratory: Negative for cough, chest tightness, shortness of breath and wheezing.   Cardiovascular: Positive for leg swelling. Negative for chest pain and palpitations.  Gastrointestinal: Negative for abdominal distention, abdominal pain, constipation, diarrhea, nausea and vomiting.  Endocrine: Negative for cold intolerance, heat intolerance, polydipsia, polyphagia and polyuria.  Genitourinary: Negative for difficulty urinating, dysuria, flank pain, frequency and urgency.       Status post Hospital for UTI treated with I.V Rocephin and discharged on Omnicef. Urologist restarted trimethoprim   Musculoskeletal: Positive for gait problem. Negative for joint swelling and myalgias.  Skin: Negative for color change, pallor and  rash.  Neurological: Negative for dizziness, speech difficulty, light-headedness and headaches.       Right hemiplegia,Aphasia   Hematological: Does not bruise/bleed easily.  Psychiatric/Behavioral: Negative for agitation and sleep disturbance. The patient is not nervous/anxious.     Immunization History  Administered Date(s) Administered  . Influenza,inj,Quad PF,6+ Mos 01/27/2018  . Influenza-Unspecified 01/21/2012, 02/05/2013, 02/21/2014, 01/26/2015, 01/23/2016, 01/23/2017, 01/13/2018, 01/21/2019  .  Moderna SARS-COVID-2 Vaccination 05/13/2019, 06/10/2019  . Pneumococcal Polysaccharide-23 05/07/2012, 05/12/2013  . Td 05/12/2013  . Zoster 05/07/2012   Pertinent  Health Maintenance Due  Topic Date Due  . DEXA SCAN  Never done  . PNA vac Low Risk Adult (2 of 2 - PCV13) 05/12/2014  . INFLUENZA VACCINE  Completed   Fall Risk  07/20/2019 06/03/2019 04/20/2019 04/08/2019 02/24/2018  Falls in the past year? 0 0 0 0 0  Comment - - - - -  Number falls in past yr: 0 0 - 0 -  Injury with Fall? 0 0 - 0 -  Risk for fall due to : - - - - -    Vitals:   07/20/19 1325  BP: 112/70  Pulse: 69  Temp: 97.9 F (36.6 C)  TempSrc: Tympanic  SpO2: 95%  Weight: 156 lb (70.8 kg)  Height: 5' 6"  (1.676 m)   Body mass index is 25.18 kg/m. Physical Exam Vitals reviewed.  Constitutional:      General: She is not in acute distress.    Appearance: She is overweight. She is not ill-appearing.  HENT:     Head: Normocephalic.     Right Ear: Tympanic membrane, ear canal and external ear normal. There is no impacted cerumen.     Left Ear: There is impacted cerumen.     Ears:     Comments: Left ear cerumen lavaged with warm water and peroxide by CMA moderates amounts of cerumen obtained.TM clear no signs of infection.Alligator forceps used by provider  to remove cerumen also.Patient tolerated procedure well.    Nose: Nose normal. No congestion or rhinorrhea.     Mouth/Throat:     Mouth: Mucous membranes are moist.     Pharynx: Oropharynx is clear. No oropharyngeal exudate or posterior oropharyngeal erythema.  Eyes:     General: No scleral icterus.       Right eye: No discharge.        Left eye: No discharge.     Extraocular Movements: Extraocular movements intact.     Conjunctiva/sclera: Conjunctivae normal.     Pupils: Pupils are equal, round, and reactive to light.  Neck:     Vascular: No carotid bruit.  Cardiovascular:     Rate and Rhythm: Normal rate and regular rhythm.     Pulses: Normal  pulses.     Heart sounds: Normal heart sounds. No murmur. No friction rub. No gallop.   Pulmonary:     Effort: Pulmonary effort is normal. No respiratory distress.     Breath sounds: Normal breath sounds. No wheezing, rhonchi or rales.  Chest:     Chest wall: No tenderness.  Abdominal:     General: Bowel sounds are normal. There is no distension.     Palpations: Abdomen is soft. There is no mass.     Tenderness: There is no abdominal tenderness. There is no right CVA tenderness, left CVA tenderness, guarding or rebound.  Musculoskeletal:        General: No swelling or tenderness.     Cervical back: Normal range of motion. No  rigidity or tenderness.     Right lower leg: Edema present.     Left lower leg: Edema present.     Comments: Unsteady gait on wheelchair during visit   Lymphadenopathy:     Cervical: No cervical adenopathy.  Skin:    General: Skin is warm and dry.     Coloration: Skin is not pale.     Findings: No bruising, erythema or rash.  Neurological:     Mental Status: She is alert and oriented to person, place, and time.     Gait: Gait abnormal.     Comments: Right side hemiplegia.Aphasia noted   Psychiatric:        Mood and Affect: Mood normal.        Behavior: Behavior normal.        Thought Content: Thought content normal.        Judgment: Judgment normal.    Labs reviewed: Recent Labs    10/29/18 0000 07/07/19 0124 07/08/19 0505  NA 141 137 136  K 4.1 4.7 3.0*  CL 107 101 105  CO2 24* 27 24  GLUCOSE  --  143* 108*  BUN 21 19 10   CREATININE 0.7 0.61 0.42*  CALCIUM  --  8.8* 8.1*  MG  --   --  1.9   Recent Labs    10/29/18 0000 07/07/19 0124  AST 14 54*  ALT 10 43  ALKPHOS 80 86  BILITOT  --  1.6*  PROT  --  7.5  ALBUMIN 4.1 4.0   Recent Labs    10/29/18 0000 07/07/19 0128 07/08/19 0505  WBC 6.0 10.3 4.4  NEUTROABS  --  8.6*  --   HGB 15.1 15.0 12.9  HCT 47* 46.9* 39.2  MCV  --  93.6 92.7  PLT 224 201 162   Lab Results  Component  Value Date   TSH 1.95 10/29/2018   Lab Results  Component Value Date   HGBA1C 5.4 06/02/2018   Lab Results  Component Value Date   CHOL 145 06/02/2018   HDL 42 06/02/2018   LDLCALC 89 06/02/2018   TRIG 71 06/02/2018    Significant Diagnostic Results in last 30 days:  CT ABDOMEN PELVIS W CONTRAST  Result Date: 07/07/2019 CLINICAL DATA:  Fever with lower abdominal pain EXAM: CT ABDOMEN AND PELVIS WITH CONTRAST TECHNIQUE: Multidetector CT imaging of the abdomen and pelvis was performed using the standard protocol following bolus administration of intravenous contrast. CONTRAST:  140m OMNIPAQUE IOHEXOL 300 MG/ML  SOLN COMPARISON:  03/16/2019 FINDINGS: Lower chest: Extensive coronary atherosclerosis. Septal thickening at both lung bases with patchy atelectasis. Hepatobiliary: Tiny low-density in the inferior right liver that is too small for densitometry.Cholelithiasis without signs of cholecystitis. Pancreas: Generalized atrophy Spleen: Unremarkable. Adrenals/Urinary Tract: Negative adrenals. Lobulated right renal cortex from scarring. Also on the right is multiple stones clustered at the lower pole and measuring up to 4 mm individually. No hydronephrosis or ureteral stone. The urothelium at the right renal pelvis appears thickened and indistinct diffusely. Unremarkable bladder. Stomach/Bowel: No obstruction. Extensive distal colonic diverticulosis without diverticulitis. No visualized appendix. No pericecal inflammation. Vascular/Lymphatic: No acute vascular abnormality. Diffuse atherosclerotic calcification. No mass or adenopathy. Reproductive:Hysterectomy Other: No ascites or pneumoperitoneum. Fatty high left lumbar hernia. Musculoskeletal: No acute abnormalities. Advanced spinal degeneration with lumbar scoliosis. IMPRESSION: 1. Urothelial thickening at the right renal pelvis where there are multiple calculi and cortical scarring, are there symptoms of urinary tract infection? 2. Colonic  diverticulosis without visible diverticulitis. 3.  Septal thickening at the lung bases consistent with mild edema. 4. Fatty high left lumbar hernia. 5.  Aortic Atherosclerosis (ICD10-I70.0). 6. Cholelithiasis. Electronically Signed   By: Monte Fantasia M.D.   On: 07/07/2019 04:11   DG Chest Port 1 View  Result Date: 07/07/2019 CLINICAL DATA:  Fever, tachypnea EXAM: PORTABLE CHEST 1 VIEW COMPARISON:  Radiograph 12/25/2015 FINDINGS: Chronically coarsened interstitial changes and bandlike area of scarring in the left lung base. No consolidation, features of edema, pneumothorax, or effusion. Postsurgical changes related to prior CABG including intact and aligned sternotomy wires and multiple surgical clips projecting over the mediastinum. The aorta is calcified. The remaining cardiomediastinal contours are unremarkable. IMPRESSION: Chronic changes as above.  No acute cardiopulmonary abnormality. Electronically Signed   By: Lovena Le M.D.   On: 07/07/2019 02:23   ECHOCARDIOGRAM COMPLETE  Result Date: 07/07/2019    ECHOCARDIOGRAM REPORT   Patient Name:   BREYONNA NAULT Date of Exam: 07/07/2019 Medical Rec #:  [id number]  Height:       70.0 in Accession #:    [id number] Weight:       155.0 lb Date of Birth:  July 06, 1940   BSA:          1.873 m Patient Age:    17 years   BP:           132/73 mmHg Patient Gender: F          HR:           89 bpm. Exam Location:  Inpatient Procedure: 2D Echo Indications:    Murmur 785.2 / R01.1  History:        Patient has prior history of Echocardiogram examinations, most                 recent 12/10/2013. Prior CABG, Stroke, Signs/Symptoms:Chest Pain;                 Risk Factors:Hypertension and Dyslipidemia. Acute kidney injury.  Sonographer:    Vikki Ports Turrentine Referring Phys: [id number] MICHAEL E NORINS  Sonographer Comments: Patient skin sensitivty to pedoff probe. IMPRESSIONS  1. Left ventricular ejection fraction, by estimation, is 60 to 65%. The left ventricle has normal function. The  left ventricle has no regional wall motion abnormalities. Left ventricular diastolic parameters are indeterminate.  2. Right ventricular systolic function is normal. The right ventricular size is normal. There is mildly elevated pulmonary artery systolic pressure. The estimated right ventricular systolic pressure is 49.7 mmHg.  3. The mitral valve is normal in structure. No evidence of mitral valve regurgitation.  4. The aortic valve was not well visualized. Aortic valve regurgitation is not visualized. Mild aortic valve stenosis. Vmax 2.2 m/s, MG 12 mmHg, AVA 1.6 cm^2, DI 0.6  5. The inferior vena cava is normal in size with greater than 50% respiratory variability, suggesting right atrial pressure of 3 mmHg. FINDINGS  Left Ventricle: Left ventricular ejection fraction, by estimation, is 60 to 65%. The left ventricle has normal function. The left ventricle has no regional wall motion abnormalities. The left ventricular internal cavity size was normal in size. There is  no left ventricular hypertrophy. Left ventricular diastolic parameters are indeterminate. Right Ventricle: The right ventricular size is normal. No increase in right ventricular wall thickness. Right ventricular systolic function is normal. There is mildly elevated pulmonary artery systolic pressure. The tricuspid regurgitant velocity is 2.71  m/s, and with an assumed right atrial pressure of 3 mmHg, the estimated right ventricular systolic pressure is  32.4 mmHg. Left Atrium: Left atrial size was normal in size. Right Atrium: Right atrial size was normal in size. Pericardium: There is no evidence of pericardial effusion. Mitral Valve: The mitral valve is normal in structure. No evidence of mitral valve regurgitation. Tricuspid Valve: The tricuspid valve is normal in structure. Tricuspid valve regurgitation is trivial. Aortic Valve: The aortic valve was not well visualized. Aortic valve regurgitation is not visualized. Mild aortic stenosis is present.  Aortic valve mean gradient measures 10.2 mmHg. Aortic valve peak gradient measures 17.6 mmHg. Aortic valve area, by VTI  measures 1.66 cm. Pulmonic Valve: The pulmonic valve was not well visualized. Pulmonic valve regurgitation is not visualized. Aorta: The aortic root is normal in size and structure. Venous: The inferior vena cava is normal in size with greater than 50% respiratory variability, suggesting right atrial pressure of 3 mmHg. IAS/Shunts: The interatrial septum was not well visualized.  LEFT VENTRICLE PLAX 2D LVIDd:         4.30 cm  Diastology LVIDs:         2.90 cm  LV e' lateral:   12.20 cm/s LV PW:         0.90 cm  LV E/e' lateral: 10.7 LV IVS:        0.90 cm  LV e' medial:    5.98 cm/s LVOT diam:     1.80 cm  LV E/e' medial:  21.7 LV SV:         66 LV SV Index:   35 LVOT Area:     2.54 cm  RIGHT VENTRICLE RV S prime:     10.10 cm/s TAPSE (M-mode): 1.2 cm LEFT ATRIUM             Index       RIGHT ATRIUM           Index LA diam:        4.00 cm 2.14 cm/m  RA Area:     12.60 cm LA Vol (A2C):   38.0 ml 20.29 ml/m RA Volume:   27.00 ml  14.42 ml/m LA Vol (A4C):   54.8 ml 29.26 ml/m LA Biplane Vol: 45.8 ml 24.45 ml/m  AORTIC VALVE AV Area (Vmax):    1.49 cm AV Area (Vmean):   1.59 cm AV Area (VTI):     1.66 cm AV Vmax:           209.80 cm/s AV Vmean:          148.800 cm/s AV VTI:            0.400 m AV Peak Grad:      17.6 mmHg AV Mean Grad:      10.2 mmHg LVOT Vmax:         123.00 cm/s LVOT Vmean:        92.800 cm/s LVOT VTI:          0.261 m LVOT/AV VTI ratio: 0.65  AORTA Ao Root diam: 3.10 cm MITRAL VALVE                TRICUSPID VALVE MV Area (PHT): 3.63 cm     TR Peak grad:   29.4 mmHg MV Decel Time: 209 msec     TR Vmax:        271.00 cm/s MV E velocity: 130.00 cm/s MV A velocity: 133.00 cm/s  SHUNTS MV E/A ratio:  0.98         Systemic VTI:  0.26 m  Systemic Diam: 1.80 cm Oswaldo Milian MD Electronically signed by Oswaldo Milian MD Signature  Date/Time: 07/07/2019/5:01:48 PM    Final     Assessment/Plan 1. Essential hypertension B/p at goal.continue on losartan 25 mg tablet daily. - continue on ASA and statin for Cardiovascular event   - CBC/diff - BMP with GFR   2. Spastic hemiplegia affecting dominant side (HCC) - continue on Losartan 25 mg tablet daily,ASA 81 mg tablet daily,clopidogrel 75 mg tablet daily and atorvastatin 20 mg tablet daily.  3. Aphasia as late effect of stroke - chronic.No dysphagia reported.    4. Hypokalemia K+ 3.0 on discharge.Daughter does not recall being discharged on potassium supplement.will recheck labs then replete if indicated.  - BMP with eGFR([facility])  5. Impacted cerumen of left ear Left ear cerumen lavaged with warm water and peroxide by CMA moderates amounts of cerumen obtained.TM clear no signs of infection.Alligator forceps used by provider  to remove cerumen also.Patient tolerated procedure well.  Family/ staff Communication: Reviewed plan of care with patient and daughter Tor Netters verbalized understanding.   Labs/tests ordered:   - CBC/diff - BMP   Next Appointment: Has upcoming appointment with PCP Dr.Reeed 11/01/2019   Sandrea Hughs, NP

## 2019-07-21 LAB — CBC WITH DIFFERENTIAL/PLATELET
Absolute Monocytes: 516 cells/uL (ref 200–950)
Basophils Absolute: 78 cells/uL (ref 0–200)
Basophils Relative: 1.3 %
Eosinophils Absolute: 396 cells/uL (ref 15–500)
Eosinophils Relative: 6.6 %
HCT: 43.1 % (ref 35.0–45.0)
Hemoglobin: 14.1 g/dL (ref 11.7–15.5)
Lymphs Abs: 1098 cells/uL (ref 850–3900)
MCH: 29.4 pg (ref 27.0–33.0)
MCHC: 32.7 g/dL (ref 32.0–36.0)
MCV: 89.8 fL (ref 80.0–100.0)
MPV: 9.3 fL (ref 7.5–12.5)
Monocytes Relative: 8.6 %
Neutro Abs: 3912 cells/uL (ref 1500–7800)
Neutrophils Relative %: 65.2 %
Platelets: 334 10*3/uL (ref 140–400)
RBC: 4.8 10*6/uL (ref 3.80–5.10)
RDW: 12 % (ref 11.0–15.0)
Total Lymphocyte: 18.3 %
WBC: 6 10*3/uL (ref 3.8–10.8)

## 2019-07-21 LAB — BASIC METABOLIC PANEL WITH GFR
BUN: 23 mg/dL (ref 7–25)
CO2: 26 mmol/L (ref 20–32)
Calcium: 9.1 mg/dL (ref 8.6–10.4)
Chloride: 105 mmol/L (ref 98–110)
Creat: 0.61 mg/dL (ref 0.60–0.88)
GFR, Est African American: 99 mL/min/{1.73_m2} (ref >=60)
GFR, Est Non African American: 85 mL/min/{1.73_m2} (ref >=60)
Glucose, Bld: 109 mg/dL (ref 65–139)
Potassium: 4.1 mmol/L (ref 3.5–5.3)
Sodium: 140 mmol/L (ref 135–146)

## 2019-07-23 DIAGNOSIS — R262 Difficulty in walking, not elsewhere classified: Secondary | ICD-10-CM | POA: Diagnosis not present

## 2019-07-23 DIAGNOSIS — R4701 Aphasia: Secondary | ICD-10-CM | POA: Diagnosis not present

## 2019-07-23 DIAGNOSIS — M25511 Pain in right shoulder: Secondary | ICD-10-CM | POA: Diagnosis not present

## 2019-07-23 DIAGNOSIS — I69398 Other sequelae of cerebral infarction: Secondary | ICD-10-CM | POA: Diagnosis not present

## 2019-07-23 DIAGNOSIS — R2681 Unsteadiness on feet: Secondary | ICD-10-CM | POA: Diagnosis not present

## 2019-07-23 DIAGNOSIS — M79604 Pain in right leg: Secondary | ICD-10-CM | POA: Diagnosis not present

## 2019-07-23 DIAGNOSIS — R278 Other lack of coordination: Secondary | ICD-10-CM | POA: Diagnosis not present

## 2019-07-23 DIAGNOSIS — M6281 Muscle weakness (generalized): Secondary | ICD-10-CM | POA: Diagnosis not present

## 2019-07-26 DIAGNOSIS — M25511 Pain in right shoulder: Secondary | ICD-10-CM | POA: Diagnosis not present

## 2019-07-26 DIAGNOSIS — M79604 Pain in right leg: Secondary | ICD-10-CM | POA: Diagnosis not present

## 2019-07-26 DIAGNOSIS — R4701 Aphasia: Secondary | ICD-10-CM | POA: Diagnosis not present

## 2019-07-26 DIAGNOSIS — R2681 Unsteadiness on feet: Secondary | ICD-10-CM | POA: Diagnosis not present

## 2019-07-26 DIAGNOSIS — R278 Other lack of coordination: Secondary | ICD-10-CM | POA: Diagnosis not present

## 2019-07-26 DIAGNOSIS — R262 Difficulty in walking, not elsewhere classified: Secondary | ICD-10-CM | POA: Diagnosis not present

## 2019-07-26 DIAGNOSIS — M6281 Muscle weakness (generalized): Secondary | ICD-10-CM | POA: Diagnosis not present

## 2019-07-26 DIAGNOSIS — I69398 Other sequelae of cerebral infarction: Secondary | ICD-10-CM | POA: Diagnosis not present

## 2019-07-27 DIAGNOSIS — M79604 Pain in right leg: Secondary | ICD-10-CM | POA: Diagnosis not present

## 2019-07-27 DIAGNOSIS — R262 Difficulty in walking, not elsewhere classified: Secondary | ICD-10-CM | POA: Diagnosis not present

## 2019-07-27 DIAGNOSIS — M6281 Muscle weakness (generalized): Secondary | ICD-10-CM | POA: Diagnosis not present

## 2019-07-27 DIAGNOSIS — R2681 Unsteadiness on feet: Secondary | ICD-10-CM | POA: Diagnosis not present

## 2019-07-27 DIAGNOSIS — R278 Other lack of coordination: Secondary | ICD-10-CM | POA: Diagnosis not present

## 2019-07-27 DIAGNOSIS — I69398 Other sequelae of cerebral infarction: Secondary | ICD-10-CM | POA: Diagnosis not present

## 2019-07-27 DIAGNOSIS — R4701 Aphasia: Secondary | ICD-10-CM | POA: Diagnosis not present

## 2019-07-27 DIAGNOSIS — M25511 Pain in right shoulder: Secondary | ICD-10-CM | POA: Diagnosis not present

## 2019-07-28 ENCOUNTER — Encounter: Payer: Self-pay | Admitting: Internal Medicine

## 2019-07-28 DIAGNOSIS — L819 Disorder of pigmentation, unspecified: Secondary | ICD-10-CM

## 2019-07-28 DIAGNOSIS — R278 Other lack of coordination: Secondary | ICD-10-CM | POA: Diagnosis not present

## 2019-07-28 DIAGNOSIS — M79604 Pain in right leg: Secondary | ICD-10-CM | POA: Diagnosis not present

## 2019-07-28 DIAGNOSIS — R4701 Aphasia: Secondary | ICD-10-CM | POA: Diagnosis not present

## 2019-07-28 DIAGNOSIS — I69398 Other sequelae of cerebral infarction: Secondary | ICD-10-CM | POA: Diagnosis not present

## 2019-07-28 DIAGNOSIS — R262 Difficulty in walking, not elsewhere classified: Secondary | ICD-10-CM | POA: Diagnosis not present

## 2019-07-28 DIAGNOSIS — R2681 Unsteadiness on feet: Secondary | ICD-10-CM | POA: Diagnosis not present

## 2019-07-28 DIAGNOSIS — M6281 Muscle weakness (generalized): Secondary | ICD-10-CM | POA: Diagnosis not present

## 2019-07-28 DIAGNOSIS — M25511 Pain in right shoulder: Secondary | ICD-10-CM | POA: Diagnosis not present

## 2019-07-29 DIAGNOSIS — R4701 Aphasia: Secondary | ICD-10-CM | POA: Diagnosis not present

## 2019-07-29 DIAGNOSIS — I69398 Other sequelae of cerebral infarction: Secondary | ICD-10-CM | POA: Diagnosis not present

## 2019-07-29 DIAGNOSIS — M79604 Pain in right leg: Secondary | ICD-10-CM | POA: Diagnosis not present

## 2019-07-29 DIAGNOSIS — M6281 Muscle weakness (generalized): Secondary | ICD-10-CM | POA: Diagnosis not present

## 2019-07-29 DIAGNOSIS — R262 Difficulty in walking, not elsewhere classified: Secondary | ICD-10-CM | POA: Diagnosis not present

## 2019-07-29 DIAGNOSIS — R2681 Unsteadiness on feet: Secondary | ICD-10-CM | POA: Diagnosis not present

## 2019-07-29 DIAGNOSIS — R278 Other lack of coordination: Secondary | ICD-10-CM | POA: Diagnosis not present

## 2019-07-29 DIAGNOSIS — M25511 Pain in right shoulder: Secondary | ICD-10-CM | POA: Diagnosis not present

## 2019-07-31 ENCOUNTER — Other Ambulatory Visit: Payer: Self-pay | Admitting: Physical Medicine and Rehabilitation

## 2019-08-02 DIAGNOSIS — R262 Difficulty in walking, not elsewhere classified: Secondary | ICD-10-CM | POA: Diagnosis not present

## 2019-08-02 DIAGNOSIS — M79604 Pain in right leg: Secondary | ICD-10-CM | POA: Diagnosis not present

## 2019-08-02 DIAGNOSIS — M25511 Pain in right shoulder: Secondary | ICD-10-CM | POA: Diagnosis not present

## 2019-08-02 DIAGNOSIS — R4701 Aphasia: Secondary | ICD-10-CM | POA: Diagnosis not present

## 2019-08-02 DIAGNOSIS — R2681 Unsteadiness on feet: Secondary | ICD-10-CM | POA: Diagnosis not present

## 2019-08-02 DIAGNOSIS — M6281 Muscle weakness (generalized): Secondary | ICD-10-CM | POA: Diagnosis not present

## 2019-08-02 DIAGNOSIS — I69398 Other sequelae of cerebral infarction: Secondary | ICD-10-CM | POA: Diagnosis not present

## 2019-08-02 DIAGNOSIS — R278 Other lack of coordination: Secondary | ICD-10-CM | POA: Diagnosis not present

## 2019-08-03 DIAGNOSIS — R278 Other lack of coordination: Secondary | ICD-10-CM | POA: Diagnosis not present

## 2019-08-03 DIAGNOSIS — M79604 Pain in right leg: Secondary | ICD-10-CM | POA: Diagnosis not present

## 2019-08-03 DIAGNOSIS — M25511 Pain in right shoulder: Secondary | ICD-10-CM | POA: Diagnosis not present

## 2019-08-03 DIAGNOSIS — R262 Difficulty in walking, not elsewhere classified: Secondary | ICD-10-CM | POA: Diagnosis not present

## 2019-08-03 DIAGNOSIS — I69398 Other sequelae of cerebral infarction: Secondary | ICD-10-CM | POA: Diagnosis not present

## 2019-08-03 DIAGNOSIS — R2681 Unsteadiness on feet: Secondary | ICD-10-CM | POA: Diagnosis not present

## 2019-08-03 DIAGNOSIS — R4701 Aphasia: Secondary | ICD-10-CM | POA: Diagnosis not present

## 2019-08-03 DIAGNOSIS — M6281 Muscle weakness (generalized): Secondary | ICD-10-CM | POA: Diagnosis not present

## 2019-08-04 DIAGNOSIS — R278 Other lack of coordination: Secondary | ICD-10-CM | POA: Diagnosis not present

## 2019-08-04 DIAGNOSIS — R4701 Aphasia: Secondary | ICD-10-CM | POA: Diagnosis not present

## 2019-08-04 DIAGNOSIS — R2681 Unsteadiness on feet: Secondary | ICD-10-CM | POA: Diagnosis not present

## 2019-08-04 DIAGNOSIS — I69398 Other sequelae of cerebral infarction: Secondary | ICD-10-CM | POA: Diagnosis not present

## 2019-08-04 DIAGNOSIS — M6281 Muscle weakness (generalized): Secondary | ICD-10-CM | POA: Diagnosis not present

## 2019-08-04 DIAGNOSIS — M79604 Pain in right leg: Secondary | ICD-10-CM | POA: Diagnosis not present

## 2019-08-04 DIAGNOSIS — R262 Difficulty in walking, not elsewhere classified: Secondary | ICD-10-CM | POA: Diagnosis not present

## 2019-08-04 DIAGNOSIS — M25511 Pain in right shoulder: Secondary | ICD-10-CM | POA: Diagnosis not present

## 2019-08-05 DIAGNOSIS — R4701 Aphasia: Secondary | ICD-10-CM | POA: Diagnosis not present

## 2019-08-05 DIAGNOSIS — M25511 Pain in right shoulder: Secondary | ICD-10-CM | POA: Diagnosis not present

## 2019-08-05 DIAGNOSIS — I69398 Other sequelae of cerebral infarction: Secondary | ICD-10-CM | POA: Diagnosis not present

## 2019-08-05 DIAGNOSIS — M6281 Muscle weakness (generalized): Secondary | ICD-10-CM | POA: Diagnosis not present

## 2019-08-05 DIAGNOSIS — M79604 Pain in right leg: Secondary | ICD-10-CM | POA: Diagnosis not present

## 2019-08-05 DIAGNOSIS — R2681 Unsteadiness on feet: Secondary | ICD-10-CM | POA: Diagnosis not present

## 2019-08-05 DIAGNOSIS — R278 Other lack of coordination: Secondary | ICD-10-CM | POA: Diagnosis not present

## 2019-08-05 DIAGNOSIS — R262 Difficulty in walking, not elsewhere classified: Secondary | ICD-10-CM | POA: Diagnosis not present

## 2019-08-09 DIAGNOSIS — R2681 Unsteadiness on feet: Secondary | ICD-10-CM | POA: Diagnosis not present

## 2019-08-09 DIAGNOSIS — I69398 Other sequelae of cerebral infarction: Secondary | ICD-10-CM | POA: Diagnosis not present

## 2019-08-09 DIAGNOSIS — M79604 Pain in right leg: Secondary | ICD-10-CM | POA: Diagnosis not present

## 2019-08-09 DIAGNOSIS — R262 Difficulty in walking, not elsewhere classified: Secondary | ICD-10-CM | POA: Diagnosis not present

## 2019-08-09 DIAGNOSIS — M6281 Muscle weakness (generalized): Secondary | ICD-10-CM | POA: Diagnosis not present

## 2019-08-09 DIAGNOSIS — M25511 Pain in right shoulder: Secondary | ICD-10-CM | POA: Diagnosis not present

## 2019-08-09 DIAGNOSIS — R4701 Aphasia: Secondary | ICD-10-CM | POA: Diagnosis not present

## 2019-08-09 DIAGNOSIS — R278 Other lack of coordination: Secondary | ICD-10-CM | POA: Diagnosis not present

## 2019-08-10 DIAGNOSIS — R4701 Aphasia: Secondary | ICD-10-CM | POA: Diagnosis not present

## 2019-08-10 DIAGNOSIS — M6281 Muscle weakness (generalized): Secondary | ICD-10-CM | POA: Diagnosis not present

## 2019-08-10 DIAGNOSIS — M79604 Pain in right leg: Secondary | ICD-10-CM | POA: Diagnosis not present

## 2019-08-10 DIAGNOSIS — I69398 Other sequelae of cerebral infarction: Secondary | ICD-10-CM | POA: Diagnosis not present

## 2019-08-10 DIAGNOSIS — R262 Difficulty in walking, not elsewhere classified: Secondary | ICD-10-CM | POA: Diagnosis not present

## 2019-08-10 DIAGNOSIS — M25511 Pain in right shoulder: Secondary | ICD-10-CM | POA: Diagnosis not present

## 2019-08-10 DIAGNOSIS — R2681 Unsteadiness on feet: Secondary | ICD-10-CM | POA: Diagnosis not present

## 2019-08-10 DIAGNOSIS — R278 Other lack of coordination: Secondary | ICD-10-CM | POA: Diagnosis not present

## 2019-08-11 DIAGNOSIS — M25511 Pain in right shoulder: Secondary | ICD-10-CM | POA: Diagnosis not present

## 2019-08-11 DIAGNOSIS — M6281 Muscle weakness (generalized): Secondary | ICD-10-CM | POA: Diagnosis not present

## 2019-08-11 DIAGNOSIS — R262 Difficulty in walking, not elsewhere classified: Secondary | ICD-10-CM | POA: Diagnosis not present

## 2019-08-11 DIAGNOSIS — R278 Other lack of coordination: Secondary | ICD-10-CM | POA: Diagnosis not present

## 2019-08-11 DIAGNOSIS — R2681 Unsteadiness on feet: Secondary | ICD-10-CM | POA: Diagnosis not present

## 2019-08-11 DIAGNOSIS — I69398 Other sequelae of cerebral infarction: Secondary | ICD-10-CM | POA: Diagnosis not present

## 2019-08-11 DIAGNOSIS — R4701 Aphasia: Secondary | ICD-10-CM | POA: Diagnosis not present

## 2019-08-11 DIAGNOSIS — M79604 Pain in right leg: Secondary | ICD-10-CM | POA: Diagnosis not present

## 2019-08-12 DIAGNOSIS — I69398 Other sequelae of cerebral infarction: Secondary | ICD-10-CM | POA: Diagnosis not present

## 2019-08-12 DIAGNOSIS — R278 Other lack of coordination: Secondary | ICD-10-CM | POA: Diagnosis not present

## 2019-08-12 DIAGNOSIS — R262 Difficulty in walking, not elsewhere classified: Secondary | ICD-10-CM | POA: Diagnosis not present

## 2019-08-12 DIAGNOSIS — M6281 Muscle weakness (generalized): Secondary | ICD-10-CM | POA: Diagnosis not present

## 2019-08-12 DIAGNOSIS — M79604 Pain in right leg: Secondary | ICD-10-CM | POA: Diagnosis not present

## 2019-08-12 DIAGNOSIS — R4701 Aphasia: Secondary | ICD-10-CM | POA: Diagnosis not present

## 2019-08-12 DIAGNOSIS — R2681 Unsteadiness on feet: Secondary | ICD-10-CM | POA: Diagnosis not present

## 2019-08-12 DIAGNOSIS — M25511 Pain in right shoulder: Secondary | ICD-10-CM | POA: Diagnosis not present

## 2019-08-13 DIAGNOSIS — I69398 Other sequelae of cerebral infarction: Secondary | ICD-10-CM | POA: Diagnosis not present

## 2019-08-13 DIAGNOSIS — M79604 Pain in right leg: Secondary | ICD-10-CM | POA: Diagnosis not present

## 2019-08-13 DIAGNOSIS — R4701 Aphasia: Secondary | ICD-10-CM | POA: Diagnosis not present

## 2019-08-13 DIAGNOSIS — R278 Other lack of coordination: Secondary | ICD-10-CM | POA: Diagnosis not present

## 2019-08-13 DIAGNOSIS — R2681 Unsteadiness on feet: Secondary | ICD-10-CM | POA: Diagnosis not present

## 2019-08-13 DIAGNOSIS — M25511 Pain in right shoulder: Secondary | ICD-10-CM | POA: Diagnosis not present

## 2019-08-13 DIAGNOSIS — R262 Difficulty in walking, not elsewhere classified: Secondary | ICD-10-CM | POA: Diagnosis not present

## 2019-08-13 DIAGNOSIS — M6281 Muscle weakness (generalized): Secondary | ICD-10-CM | POA: Diagnosis not present

## 2019-08-17 DIAGNOSIS — M25511 Pain in right shoulder: Secondary | ICD-10-CM | POA: Diagnosis not present

## 2019-08-17 DIAGNOSIS — R2681 Unsteadiness on feet: Secondary | ICD-10-CM | POA: Diagnosis not present

## 2019-08-17 DIAGNOSIS — I69398 Other sequelae of cerebral infarction: Secondary | ICD-10-CM | POA: Diagnosis not present

## 2019-08-17 DIAGNOSIS — M6281 Muscle weakness (generalized): Secondary | ICD-10-CM | POA: Diagnosis not present

## 2019-08-17 DIAGNOSIS — M79604 Pain in right leg: Secondary | ICD-10-CM | POA: Diagnosis not present

## 2019-08-17 DIAGNOSIS — R262 Difficulty in walking, not elsewhere classified: Secondary | ICD-10-CM | POA: Diagnosis not present

## 2019-08-17 DIAGNOSIS — R4701 Aphasia: Secondary | ICD-10-CM | POA: Diagnosis not present

## 2019-08-17 DIAGNOSIS — R278 Other lack of coordination: Secondary | ICD-10-CM | POA: Diagnosis not present

## 2019-08-18 DIAGNOSIS — R262 Difficulty in walking, not elsewhere classified: Secondary | ICD-10-CM | POA: Diagnosis not present

## 2019-08-18 DIAGNOSIS — M6281 Muscle weakness (generalized): Secondary | ICD-10-CM | POA: Diagnosis not present

## 2019-08-18 DIAGNOSIS — M25511 Pain in right shoulder: Secondary | ICD-10-CM | POA: Diagnosis not present

## 2019-08-18 DIAGNOSIS — R2681 Unsteadiness on feet: Secondary | ICD-10-CM | POA: Diagnosis not present

## 2019-08-18 DIAGNOSIS — M79604 Pain in right leg: Secondary | ICD-10-CM | POA: Diagnosis not present

## 2019-08-18 DIAGNOSIS — R4701 Aphasia: Secondary | ICD-10-CM | POA: Diagnosis not present

## 2019-08-18 DIAGNOSIS — I69398 Other sequelae of cerebral infarction: Secondary | ICD-10-CM | POA: Diagnosis not present

## 2019-08-18 DIAGNOSIS — R278 Other lack of coordination: Secondary | ICD-10-CM | POA: Diagnosis not present

## 2019-08-19 DIAGNOSIS — I69398 Other sequelae of cerebral infarction: Secondary | ICD-10-CM | POA: Diagnosis not present

## 2019-08-19 DIAGNOSIS — M79604 Pain in right leg: Secondary | ICD-10-CM | POA: Diagnosis not present

## 2019-08-19 DIAGNOSIS — M25511 Pain in right shoulder: Secondary | ICD-10-CM | POA: Diagnosis not present

## 2019-08-19 DIAGNOSIS — R4701 Aphasia: Secondary | ICD-10-CM | POA: Diagnosis not present

## 2019-08-19 DIAGNOSIS — R2681 Unsteadiness on feet: Secondary | ICD-10-CM | POA: Diagnosis not present

## 2019-08-19 DIAGNOSIS — R278 Other lack of coordination: Secondary | ICD-10-CM | POA: Diagnosis not present

## 2019-08-19 DIAGNOSIS — M6281 Muscle weakness (generalized): Secondary | ICD-10-CM | POA: Diagnosis not present

## 2019-08-19 DIAGNOSIS — R262 Difficulty in walking, not elsewhere classified: Secondary | ICD-10-CM | POA: Diagnosis not present

## 2019-08-20 DIAGNOSIS — R4701 Aphasia: Secondary | ICD-10-CM | POA: Diagnosis not present

## 2019-08-20 DIAGNOSIS — R278 Other lack of coordination: Secondary | ICD-10-CM | POA: Diagnosis not present

## 2019-08-20 DIAGNOSIS — M25511 Pain in right shoulder: Secondary | ICD-10-CM | POA: Diagnosis not present

## 2019-08-20 DIAGNOSIS — I69398 Other sequelae of cerebral infarction: Secondary | ICD-10-CM | POA: Diagnosis not present

## 2019-08-20 DIAGNOSIS — M79604 Pain in right leg: Secondary | ICD-10-CM | POA: Diagnosis not present

## 2019-08-20 DIAGNOSIS — M6281 Muscle weakness (generalized): Secondary | ICD-10-CM | POA: Diagnosis not present

## 2019-08-20 DIAGNOSIS — R262 Difficulty in walking, not elsewhere classified: Secondary | ICD-10-CM | POA: Diagnosis not present

## 2019-08-20 DIAGNOSIS — R2681 Unsteadiness on feet: Secondary | ICD-10-CM | POA: Diagnosis not present

## 2019-08-23 DIAGNOSIS — M79604 Pain in right leg: Secondary | ICD-10-CM | POA: Diagnosis not present

## 2019-08-23 DIAGNOSIS — R278 Other lack of coordination: Secondary | ICD-10-CM | POA: Diagnosis not present

## 2019-08-23 DIAGNOSIS — M25511 Pain in right shoulder: Secondary | ICD-10-CM | POA: Diagnosis not present

## 2019-08-23 DIAGNOSIS — R4701 Aphasia: Secondary | ICD-10-CM | POA: Diagnosis not present

## 2019-08-23 DIAGNOSIS — R2681 Unsteadiness on feet: Secondary | ICD-10-CM | POA: Diagnosis not present

## 2019-08-23 DIAGNOSIS — M6281 Muscle weakness (generalized): Secondary | ICD-10-CM | POA: Diagnosis not present

## 2019-08-23 DIAGNOSIS — I69398 Other sequelae of cerebral infarction: Secondary | ICD-10-CM | POA: Diagnosis not present

## 2019-08-23 DIAGNOSIS — R262 Difficulty in walking, not elsewhere classified: Secondary | ICD-10-CM | POA: Diagnosis not present

## 2019-08-25 DIAGNOSIS — I69398 Other sequelae of cerebral infarction: Secondary | ICD-10-CM | POA: Diagnosis not present

## 2019-08-25 DIAGNOSIS — R2681 Unsteadiness on feet: Secondary | ICD-10-CM | POA: Diagnosis not present

## 2019-08-25 DIAGNOSIS — M6281 Muscle weakness (generalized): Secondary | ICD-10-CM | POA: Diagnosis not present

## 2019-08-25 DIAGNOSIS — R4701 Aphasia: Secondary | ICD-10-CM | POA: Diagnosis not present

## 2019-08-25 DIAGNOSIS — M25511 Pain in right shoulder: Secondary | ICD-10-CM | POA: Diagnosis not present

## 2019-08-25 DIAGNOSIS — M79604 Pain in right leg: Secondary | ICD-10-CM | POA: Diagnosis not present

## 2019-08-25 DIAGNOSIS — R262 Difficulty in walking, not elsewhere classified: Secondary | ICD-10-CM | POA: Diagnosis not present

## 2019-08-25 DIAGNOSIS — R278 Other lack of coordination: Secondary | ICD-10-CM | POA: Diagnosis not present

## 2019-08-26 DIAGNOSIS — M79604 Pain in right leg: Secondary | ICD-10-CM | POA: Diagnosis not present

## 2019-08-26 DIAGNOSIS — R2681 Unsteadiness on feet: Secondary | ICD-10-CM | POA: Diagnosis not present

## 2019-08-26 DIAGNOSIS — R278 Other lack of coordination: Secondary | ICD-10-CM | POA: Diagnosis not present

## 2019-08-26 DIAGNOSIS — I69398 Other sequelae of cerebral infarction: Secondary | ICD-10-CM | POA: Diagnosis not present

## 2019-08-26 DIAGNOSIS — R4701 Aphasia: Secondary | ICD-10-CM | POA: Diagnosis not present

## 2019-08-26 DIAGNOSIS — M6281 Muscle weakness (generalized): Secondary | ICD-10-CM | POA: Diagnosis not present

## 2019-08-26 DIAGNOSIS — M25511 Pain in right shoulder: Secondary | ICD-10-CM | POA: Diagnosis not present

## 2019-08-26 DIAGNOSIS — R262 Difficulty in walking, not elsewhere classified: Secondary | ICD-10-CM | POA: Diagnosis not present

## 2019-08-30 DIAGNOSIS — M6281 Muscle weakness (generalized): Secondary | ICD-10-CM | POA: Diagnosis not present

## 2019-08-30 DIAGNOSIS — R262 Difficulty in walking, not elsewhere classified: Secondary | ICD-10-CM | POA: Diagnosis not present

## 2019-08-30 DIAGNOSIS — R2681 Unsteadiness on feet: Secondary | ICD-10-CM | POA: Diagnosis not present

## 2019-08-30 DIAGNOSIS — I69398 Other sequelae of cerebral infarction: Secondary | ICD-10-CM | POA: Diagnosis not present

## 2019-08-30 DIAGNOSIS — M25511 Pain in right shoulder: Secondary | ICD-10-CM | POA: Diagnosis not present

## 2019-08-30 DIAGNOSIS — R278 Other lack of coordination: Secondary | ICD-10-CM | POA: Diagnosis not present

## 2019-08-30 DIAGNOSIS — M79604 Pain in right leg: Secondary | ICD-10-CM | POA: Diagnosis not present

## 2019-08-30 DIAGNOSIS — R4701 Aphasia: Secondary | ICD-10-CM | POA: Diagnosis not present

## 2019-08-31 ENCOUNTER — Encounter: Payer: Self-pay | Admitting: Podiatry

## 2019-08-31 ENCOUNTER — Other Ambulatory Visit: Payer: Self-pay

## 2019-08-31 ENCOUNTER — Ambulatory Visit (INDEPENDENT_AMBULATORY_CARE_PROVIDER_SITE_OTHER): Payer: Medicare PPO | Admitting: Podiatry

## 2019-08-31 VITALS — Temp 97.2°F

## 2019-08-31 DIAGNOSIS — B351 Tinea unguium: Secondary | ICD-10-CM

## 2019-08-31 DIAGNOSIS — R278 Other lack of coordination: Secondary | ICD-10-CM | POA: Diagnosis not present

## 2019-08-31 DIAGNOSIS — R2681 Unsteadiness on feet: Secondary | ICD-10-CM | POA: Diagnosis not present

## 2019-08-31 DIAGNOSIS — M79604 Pain in right leg: Secondary | ICD-10-CM | POA: Diagnosis not present

## 2019-08-31 DIAGNOSIS — M6281 Muscle weakness (generalized): Secondary | ICD-10-CM | POA: Diagnosis not present

## 2019-08-31 DIAGNOSIS — L84 Corns and callosities: Secondary | ICD-10-CM

## 2019-08-31 DIAGNOSIS — G5791 Unspecified mononeuropathy of right lower limb: Secondary | ICD-10-CM | POA: Diagnosis not present

## 2019-08-31 DIAGNOSIS — M25511 Pain in right shoulder: Secondary | ICD-10-CM | POA: Diagnosis not present

## 2019-08-31 DIAGNOSIS — R262 Difficulty in walking, not elsewhere classified: Secondary | ICD-10-CM | POA: Diagnosis not present

## 2019-08-31 DIAGNOSIS — M79675 Pain in left toe(s): Secondary | ICD-10-CM | POA: Diagnosis not present

## 2019-08-31 DIAGNOSIS — M79674 Pain in right toe(s): Secondary | ICD-10-CM

## 2019-08-31 DIAGNOSIS — R4701 Aphasia: Secondary | ICD-10-CM | POA: Diagnosis not present

## 2019-08-31 DIAGNOSIS — I69398 Other sequelae of cerebral infarction: Secondary | ICD-10-CM | POA: Diagnosis not present

## 2019-08-31 NOTE — Patient Instructions (Signed)
Corns and Calluses Corns are small areas of thickened skin that occur on the top, sides, or tip of a toe. They contain a cone-shaped core with a point that can press on a nerve below. This causes pain.  Calluses are areas of thickened skin that can occur anywhere on the body, including the hands, fingers, palms, soles of the feet, and heels. Calluses are usually larger than corns. What are the causes? Corns and calluses are caused by rubbing (friction) or pressure, such as from shoes that are too tight or do not fit properly. What increases the risk? Corns are more likely to develop in people who have misshapen toes (toe deformities), such as hammer toes. Calluses can occur with friction to any area of the skin. They are more likely to develop in people who:  Work with their hands.  Wear shoes that fit poorly, are too tight, or are high-heeled.  Have toe deformities. What are the signs or symptoms? Symptoms of a corn or callus include:  A hard growth on the skin.  Pain or tenderness under the skin.  Redness and swelling.  Increased discomfort while wearing tight-fitting shoes, if your feet are affected. If a corn or callus becomes infected, symptoms may include:  Redness and swelling that gets worse.  Pain.  Fluid, blood, or pus draining from the corn or callus. How is this diagnosed? Corns and calluses may be diagnosed based on your symptoms, your medical history, and a physical exam. How is this treated? Treatment for corns and calluses may include:  Removing the cause of the friction or pressure. This may involve: ? Changing your shoes. ? Wearing shoe inserts (orthotics) or other protective layers in your shoes, such as a corn pad. ? Wearing gloves.  Applying medicine to the skin (topical medicine) to help soften skin in the hardened, thickened areas.  Removing layers of dead skin with a file to reduce the size of the corn or callus.  Removing the corn or callus with a  scalpel or laser.  Taking antibiotic medicines, if your corn or callus is infected.  Having surgery, if a toe deformity is the cause. Follow these instructions at home:   Take over-the-counter and prescription medicines only as told by your health care provider.  If you were prescribed an antibiotic, take it as told by your health care provider. Do not stop taking it even if your condition starts to improve.  Wear shoes that fit well. Avoid wearing high-heeled shoes and shoes that are too tight or too loose.  Wear any padding, protective layers, gloves, or orthotics as told by your health care provider.  Soak your hands or feet and then use a file or pumice stone to soften your corn or callus. Do this as told by your health care provider.  Check your corn or callus every day for symptoms of infection. Contact a health care provider if you:  Notice that your symptoms do not improve with treatment.  Have redness or swelling that gets worse.  Notice that your corn or callus becomes painful.  Have fluid, blood, or pus coming from your corn or callus.  Have new symptoms. Summary  Corns are small areas of thickened skin that occur on the top, sides, or tip of a toe.  Calluses are areas of thickened skin that can occur anywhere on the body, including the hands, fingers, palms, and soles of the feet. Calluses are usually larger than corns.  Corns and calluses are caused by   rubbing (friction) or pressure, such as from shoes that are too tight or do not fit properly.  Treatment may include wearing any padding, protective layers, gloves, or orthotics as told by your health care provider. This information is not intended to replace advice given to you by your health care provider. Make sure you discuss any questions you have with your health care provider. Document Revised: 07/29/2018 Document Reviewed: 02/19/2017 Elsevier Patient Education  2020 [employer]  Onychomycosis/Fungal  Toenails  WHAT IS IT? An infection that lies within the keratin of your nail plate that is caused by a fungus.  WHY ME? Fungal infections affect all ages, sexes, races, and creeds.  There may be many factors that predispose you to a fungal infection such as age, coexisting medical conditions such as diabetes, or an autoimmune disease; stress, medications, fatigue, genetics, etc.  Bottom line: fungus thrives in a warm, moist environment and your shoes offer such a location.  IS IT CONTAGIOUS? Theoretically, yes.  You do not want to share shoes, nail clippers or files with someone who has fungal toenails.  Walking around barefoot in the same room or sleeping in the same bed is unlikely to transfer the organism.  It is important to realize, however, that fungus can spread easily from one nail to the next on the same foot.  HOW DO WE TREAT THIS?  There are several ways to treat this condition.  Treatment may depend on many factors such as age, medications, pregnancy, liver and kidney conditions, etc.  It is best to ask your doctor which options are available to you.  4. No treatment.   Unlike many other medical concerns, you can live with this condition.  However for many people this can be a painful condition and may lead to ingrown toenails or a bacterial infection.  It is recommended that you keep the nails cut short to help reduce the amount of fungal nail. 5. Topical treatment.  These range from herbal remedies to prescription strength nail lacquers.  About 40-50% effective, topicals require twice daily application for approximately 9 to 12 months or until an entirely new nail has grown out.  The most effective topicals are medical grade medications available through physicians offices. 6. Oral antifungal medications.  With an 80-90% cure rate, the most common oral medication requires 3 to 4 months of therapy and stays in your system for a year as the new nail grows out.  Oral antifungal medications do  require blood work to make sure it is a safe drug for you.  A liver function panel will be performed prior to starting the medication and after the first month of treatment.  It is important to have the blood work performed to avoid any harmful side effects.  In general, this medication safe but blood work is required. 7. Laser Therapy.  This treatment is performed by applying a specialized laser to the affected nail plate.  This therapy is noninvasive, fast, and non-painful.  It is not covered by insurance and is therefore, out of pocket.  The results have been very good with a 80-95% cure rate.  [facility] is the only practice in the area to offer this therapy. 8. Permanent Nail Avulsion.  Removing the entire nail so that a new nail will not grow back. 

## 2019-09-01 DIAGNOSIS — M79604 Pain in right leg: Secondary | ICD-10-CM | POA: Diagnosis not present

## 2019-09-01 DIAGNOSIS — R4701 Aphasia: Secondary | ICD-10-CM | POA: Diagnosis not present

## 2019-09-01 DIAGNOSIS — R278 Other lack of coordination: Secondary | ICD-10-CM | POA: Diagnosis not present

## 2019-09-01 DIAGNOSIS — M25511 Pain in right shoulder: Secondary | ICD-10-CM | POA: Diagnosis not present

## 2019-09-01 DIAGNOSIS — I69398 Other sequelae of cerebral infarction: Secondary | ICD-10-CM | POA: Diagnosis not present

## 2019-09-01 DIAGNOSIS — R262 Difficulty in walking, not elsewhere classified: Secondary | ICD-10-CM | POA: Diagnosis not present

## 2019-09-01 DIAGNOSIS — M6281 Muscle weakness (generalized): Secondary | ICD-10-CM | POA: Diagnosis not present

## 2019-09-01 DIAGNOSIS — R2681 Unsteadiness on feet: Secondary | ICD-10-CM | POA: Diagnosis not present

## 2019-09-03 DIAGNOSIS — M79604 Pain in right leg: Secondary | ICD-10-CM | POA: Diagnosis not present

## 2019-09-03 DIAGNOSIS — M25511 Pain in right shoulder: Secondary | ICD-10-CM | POA: Diagnosis not present

## 2019-09-03 DIAGNOSIS — R2681 Unsteadiness on feet: Secondary | ICD-10-CM | POA: Diagnosis not present

## 2019-09-03 DIAGNOSIS — I69398 Other sequelae of cerebral infarction: Secondary | ICD-10-CM | POA: Diagnosis not present

## 2019-09-03 DIAGNOSIS — R262 Difficulty in walking, not elsewhere classified: Secondary | ICD-10-CM | POA: Diagnosis not present

## 2019-09-03 DIAGNOSIS — M6281 Muscle weakness (generalized): Secondary | ICD-10-CM | POA: Diagnosis not present

## 2019-09-03 DIAGNOSIS — R4701 Aphasia: Secondary | ICD-10-CM | POA: Diagnosis not present

## 2019-09-03 DIAGNOSIS — R278 Other lack of coordination: Secondary | ICD-10-CM | POA: Diagnosis not present

## 2019-09-06 DIAGNOSIS — I69398 Other sequelae of cerebral infarction: Secondary | ICD-10-CM | POA: Diagnosis not present

## 2019-09-06 DIAGNOSIS — R4701 Aphasia: Secondary | ICD-10-CM | POA: Diagnosis not present

## 2019-09-06 DIAGNOSIS — R278 Other lack of coordination: Secondary | ICD-10-CM | POA: Diagnosis not present

## 2019-09-06 DIAGNOSIS — M79604 Pain in right leg: Secondary | ICD-10-CM | POA: Diagnosis not present

## 2019-09-06 DIAGNOSIS — R262 Difficulty in walking, not elsewhere classified: Secondary | ICD-10-CM | POA: Diagnosis not present

## 2019-09-06 DIAGNOSIS — R2681 Unsteadiness on feet: Secondary | ICD-10-CM | POA: Diagnosis not present

## 2019-09-06 DIAGNOSIS — M6281 Muscle weakness (generalized): Secondary | ICD-10-CM | POA: Diagnosis not present

## 2019-09-06 DIAGNOSIS — M25511 Pain in right shoulder: Secondary | ICD-10-CM | POA: Diagnosis not present

## 2019-09-07 DIAGNOSIS — I69398 Other sequelae of cerebral infarction: Secondary | ICD-10-CM | POA: Diagnosis not present

## 2019-09-07 DIAGNOSIS — M25511 Pain in right shoulder: Secondary | ICD-10-CM | POA: Diagnosis not present

## 2019-09-07 DIAGNOSIS — R278 Other lack of coordination: Secondary | ICD-10-CM | POA: Diagnosis not present

## 2019-09-07 DIAGNOSIS — M79604 Pain in right leg: Secondary | ICD-10-CM | POA: Diagnosis not present

## 2019-09-07 DIAGNOSIS — R2681 Unsteadiness on feet: Secondary | ICD-10-CM | POA: Diagnosis not present

## 2019-09-07 DIAGNOSIS — M6281 Muscle weakness (generalized): Secondary | ICD-10-CM | POA: Diagnosis not present

## 2019-09-07 DIAGNOSIS — R4701 Aphasia: Secondary | ICD-10-CM | POA: Diagnosis not present

## 2019-09-07 DIAGNOSIS — R262 Difficulty in walking, not elsewhere classified: Secondary | ICD-10-CM | POA: Diagnosis not present

## 2019-09-08 ENCOUNTER — Other Ambulatory Visit: Payer: Self-pay | Admitting: *Deleted

## 2019-09-08 DIAGNOSIS — M7989 Other specified soft tissue disorders: Secondary | ICD-10-CM

## 2019-09-08 DIAGNOSIS — R2681 Unsteadiness on feet: Secondary | ICD-10-CM | POA: Diagnosis not present

## 2019-09-08 DIAGNOSIS — R262 Difficulty in walking, not elsewhere classified: Secondary | ICD-10-CM | POA: Diagnosis not present

## 2019-09-08 DIAGNOSIS — I69398 Other sequelae of cerebral infarction: Secondary | ICD-10-CM | POA: Diagnosis not present

## 2019-09-08 DIAGNOSIS — M6281 Muscle weakness (generalized): Secondary | ICD-10-CM | POA: Diagnosis not present

## 2019-09-08 DIAGNOSIS — M79604 Pain in right leg: Secondary | ICD-10-CM | POA: Diagnosis not present

## 2019-09-08 DIAGNOSIS — M25511 Pain in right shoulder: Secondary | ICD-10-CM | POA: Diagnosis not present

## 2019-09-08 DIAGNOSIS — R278 Other lack of coordination: Secondary | ICD-10-CM | POA: Diagnosis not present

## 2019-09-08 DIAGNOSIS — R4701 Aphasia: Secondary | ICD-10-CM | POA: Diagnosis not present

## 2019-09-09 DIAGNOSIS — I69398 Other sequelae of cerebral infarction: Secondary | ICD-10-CM | POA: Diagnosis not present

## 2019-09-09 DIAGNOSIS — R4701 Aphasia: Secondary | ICD-10-CM | POA: Diagnosis not present

## 2019-09-09 DIAGNOSIS — M6281 Muscle weakness (generalized): Secondary | ICD-10-CM | POA: Diagnosis not present

## 2019-09-09 DIAGNOSIS — R2681 Unsteadiness on feet: Secondary | ICD-10-CM | POA: Diagnosis not present

## 2019-09-09 DIAGNOSIS — R278 Other lack of coordination: Secondary | ICD-10-CM | POA: Diagnosis not present

## 2019-09-09 DIAGNOSIS — M25511 Pain in right shoulder: Secondary | ICD-10-CM | POA: Diagnosis not present

## 2019-09-09 DIAGNOSIS — R262 Difficulty in walking, not elsewhere classified: Secondary | ICD-10-CM | POA: Diagnosis not present

## 2019-09-09 DIAGNOSIS — M79604 Pain in right leg: Secondary | ICD-10-CM | POA: Diagnosis not present

## 2019-09-09 NOTE — Progress Notes (Signed)
Subjective: Kristin [name] is a 79 y.o. female patient seen today at [facility] with history of peripheral neuropathy and corn(s) of both feet and painful mycotic toenails b/l that are difficult to trim. Pain interferes with ambulation. Aggravating factors include wearing enclosed shoe gear. Pain is relieved with periodic professional debridement.  Patient Active Problem List   Diagnosis Date Noted  . Fever 07/07/2019  . Acute costochondritis 07/07/2019  . Acute UTI 07/07/2019  . Nephrolithiasis 02/24/2017  . Acute lower UTI 02/24/2017  . Colitis 02/24/2017  . Hypokalemia 02/24/2017  . AKI (acute kidney injury) ([place]) 02/24/2017  . Acute kidney injury ([place]) 02/24/2017  . Acute cystitis with hematuria   . History of stroke with residual deficit 02/04/2017  . Female stress incontinence 01/09/2017  . Ureteral calculus, right 01/09/2017  . Nocturia 12/26/2016  . Impacted cerumen of left ear 11/07/2016  . Left ear pain 11/07/2016  . Urinary urgency 08/20/2016  . Chest pain syndrome   . Chest pain 10/17/2015  . IDA (iron deficiency anemia) 10/04/2015  . Right leg swelling 08/14/2015  . Right rotator cuff tendinitis 08/14/2015  . Biceps tendonitis on left 03/13/2015  . Injury of left rotator cuff 08/17/2014  . Shoulder subluxation, right 07/06/2014  . S/P CABG x 2 12/17/2013  . Essential hypertension   . Unstable angina ([place]) 12/09/2013  . Atherosclerotic heart disease of native coronary artery with unstable angina pectoris: H/o PCI to RCA; Ostial LAD &dRCA severe stenosis. 12/09/2013  . Dyslipidemia, goal LDL below 70 12/09/2013  . Prolonged QT interval 12/09/2013  . Open wound of scalp, without mention of complication [id number]  . CVA 2001 06/01/2012  . Unilateral primary osteoarthritis, right knee 06/01/2012  . Lumbago 06/01/2012  . Spastic hemiplegia affecting dominant side ([place]) 06/01/2012  . Aphasia as late effect of stroke 06/01/2012    Current Outpatient Medications on  File Prior to Visit  Medication Sig Dispense Refill  . aspirin 81 MG tablet Take 81 mg by mouth daily.    Marland Kitchen atorvastatin (LIPITOR) 20 MG tablet TAKE 1 TABLET EACH DAY. 90 tablet 0  . clopidogrel (PLAVIX) 75 MG tablet TAKE 1 TABLET EACH DAY. 90 tablet 0  . Coenzyme Q10 (COQ-10 PO) Take 1 tablet daily by mouth.    . desmopressin (DDAVP) 0.2 MG tablet Take 0.2 mg by mouth at bedtime.     . diclofenac sodium (VOLTAREN) 1 % GEL Apply 4 g topically 4 (four) times daily as needed (pain).     . DULoxetine (CYMBALTA) 20 MG capsule Take 1 capsule (20 mg total) by mouth daily. 90 capsule 1  . gabapentin (NEURONTIN) 300 MG capsule TAKE (1) CAPSULE THREE TIMES DAILY. 90 capsule 3  . losartan (COZAAR) 25 MG tablet TAKE 1 TABLET ONCE DAILY. 90 tablet 0  . mirabegron ER (MYRBETRIQ) 50 MG TB24 tablet Take 50 mg by mouth daily.     . nitroGLYCERIN (NITROSTAT) 0.4 MG SL tablet Place 1 tablet (0.4 mg total) under the tongue every 5 (five) minutes as needed for chest pain. 30 tablet 0  . omeprazole (PRILOSEC) 20 MG capsule TAKE 1 CAPSULE EVERY DAY. 90 capsule 0  . trimethoprim (TRIMPEX) 100 MG tablet Take 1 tablet by mouth daily.    . TURMERIC PO Take 1 tablet by mouth daily.      No current facility-administered medications on file prior to visit.    No Known Allergies  Objective: Physical Exam  General: Kristin [name] is a pleasant 79 y.o. y.o. Caucasian  female, in NAD. AAO x 3.   Vascular:  Neurovascular status unchanged b/l. Capillary refill time to digits immediate b/l. Palpable DP pulses b/l. Palpable PT pulses b/l. Pedal hair absent b/l Skin temperature gradient within normal limits b/l. No edema noted b/l.  Dermatological:  Pedal skin with normal turgor, texture and tone bilaterally. No open wounds bilaterally. No interdigital macerations bilaterally. Toenails 1-5 b/l elongated, dystrophic, thickened, crumbly with subungual debris and tenderness to dorsal palpation. Hyperkeratotic lesion(s) R 3rd toe.   No erythema, no edema, no drainage, no flocculence.  Musculoskeletal:  Normal muscle strength 5/5 to all lower extremity muscle groups bilaterally. No gross bony deformities bilaterally. No pain crepitus or joint limitation noted with ROM b/l. Clawtoes 1-5 right foot. Wearing AFO for right LE.  Neurological:  Protective sensation intact with 10 gram monofilament left lower extremity. Protective sensation decreased with 10 gram monofilament right lower extremity.  Assessment and Plan:  1. Pain due to onychomycosis of toenails of both feet   2. Corns   3. Neuropathy of right lower extremity    -Examined patient. -No new findings. No new orders. -Toenails 1-5 b/l were debrided in length and girth with sterile nail nippers and dremel without iatrogenic bleeding.  -Corn(s) R 3rd toe pared utilizing sterile scalpel blade without complication or incident. Total number debrided=1. -Patient to continue soft, supportive shoe gear daily. -Patient to report any pedal injuries to medical professional immediately. -Patient/POA to call should there be question/concern in the interim.  Return in about 3 months (around 12/01/2019) for nail and callus trim.  Marzetta Board, DPM

## 2019-09-10 DIAGNOSIS — M25511 Pain in right shoulder: Secondary | ICD-10-CM | POA: Diagnosis not present

## 2019-09-10 DIAGNOSIS — R4701 Aphasia: Secondary | ICD-10-CM | POA: Diagnosis not present

## 2019-09-10 DIAGNOSIS — R278 Other lack of coordination: Secondary | ICD-10-CM | POA: Diagnosis not present

## 2019-09-10 DIAGNOSIS — I69398 Other sequelae of cerebral infarction: Secondary | ICD-10-CM | POA: Diagnosis not present

## 2019-09-10 DIAGNOSIS — R262 Difficulty in walking, not elsewhere classified: Secondary | ICD-10-CM | POA: Diagnosis not present

## 2019-09-10 DIAGNOSIS — R2681 Unsteadiness on feet: Secondary | ICD-10-CM | POA: Diagnosis not present

## 2019-09-10 DIAGNOSIS — M6281 Muscle weakness (generalized): Secondary | ICD-10-CM | POA: Diagnosis not present

## 2019-09-10 DIAGNOSIS — M79604 Pain in right leg: Secondary | ICD-10-CM | POA: Diagnosis not present

## 2019-09-13 DIAGNOSIS — M25511 Pain in right shoulder: Secondary | ICD-10-CM | POA: Diagnosis not present

## 2019-09-13 DIAGNOSIS — I69398 Other sequelae of cerebral infarction: Secondary | ICD-10-CM | POA: Diagnosis not present

## 2019-09-13 DIAGNOSIS — M79604 Pain in right leg: Secondary | ICD-10-CM | POA: Diagnosis not present

## 2019-09-13 DIAGNOSIS — R262 Difficulty in walking, not elsewhere classified: Secondary | ICD-10-CM | POA: Diagnosis not present

## 2019-09-13 DIAGNOSIS — R278 Other lack of coordination: Secondary | ICD-10-CM | POA: Diagnosis not present

## 2019-09-13 DIAGNOSIS — R2681 Unsteadiness on feet: Secondary | ICD-10-CM | POA: Diagnosis not present

## 2019-09-13 DIAGNOSIS — R4701 Aphasia: Secondary | ICD-10-CM | POA: Diagnosis not present

## 2019-09-13 DIAGNOSIS — M6281 Muscle weakness (generalized): Secondary | ICD-10-CM | POA: Diagnosis not present

## 2019-09-14 ENCOUNTER — Telehealth (HOSPITAL_COMMUNITY): Payer: Self-pay

## 2019-09-14 DIAGNOSIS — R2681 Unsteadiness on feet: Secondary | ICD-10-CM | POA: Diagnosis not present

## 2019-09-14 DIAGNOSIS — I69398 Other sequelae of cerebral infarction: Secondary | ICD-10-CM | POA: Diagnosis not present

## 2019-09-14 DIAGNOSIS — R4701 Aphasia: Secondary | ICD-10-CM | POA: Diagnosis not present

## 2019-09-14 DIAGNOSIS — M79604 Pain in right leg: Secondary | ICD-10-CM | POA: Diagnosis not present

## 2019-09-14 DIAGNOSIS — R278 Other lack of coordination: Secondary | ICD-10-CM | POA: Diagnosis not present

## 2019-09-14 DIAGNOSIS — M6281 Muscle weakness (generalized): Secondary | ICD-10-CM | POA: Diagnosis not present

## 2019-09-14 DIAGNOSIS — M25511 Pain in right shoulder: Secondary | ICD-10-CM | POA: Diagnosis not present

## 2019-09-14 DIAGNOSIS — R262 Difficulty in walking, not elsewhere classified: Secondary | ICD-10-CM | POA: Diagnosis not present

## 2019-09-14 NOTE — Telephone Encounter (Signed)
The above patient or their representative was contacted and gave the following answers to these questions:         Do you have any of the following symptoms?    NO  Fever                    Cough                   Shortness of breath  Do  you have any of the following other symptoms?    muscle pain         vomiting,        diarrhea        rash         weakness        red eye        abdominal pain         bruising          bruising or bleeding              joint pain           severe headache    Have you been in contact with someone who was or has been sick in the past 2 weeks?  NO  Yes                 Unsure                         Unable to assess   Does the person that you were in contact with have any of the following symptoms?   Cough         shortness of breath           muscle pain         vomiting,            diarrhea            rash            weakness           fever            red eye           abdominal pain           bruising  or  bleeding                joint pain                severe headache                 COMMENTS OR ACTION PLAN FOR THIS PATIENT:        PT IS FULLY VACCINATED ( COVID)/[facility]

## 2019-09-15 ENCOUNTER — Ambulatory Visit (INDEPENDENT_AMBULATORY_CARE_PROVIDER_SITE_OTHER): Payer: Medicare PPO | Admitting: Vascular Surgery

## 2019-09-15 ENCOUNTER — Encounter: Payer: Self-pay | Admitting: Vascular Surgery

## 2019-09-15 ENCOUNTER — Other Ambulatory Visit: Payer: Self-pay

## 2019-09-15 ENCOUNTER — Ambulatory Visit (HOSPITAL_COMMUNITY)
Admission: RE | Admit: 2019-09-15 | Discharge: 2019-09-15 | Disposition: A | Discharge status: Home / Self Care | Payer: Medicare PPO | Source: Ambulatory Visit | Attending: Vascular Surgery | Admitting: Vascular Surgery

## 2019-09-15 VITALS — BP 122/76 | HR 60 | Temp 97.4°F | Resp 20 | Ht 66.0 in | Wt 156.0 lb

## 2019-09-15 DIAGNOSIS — M7989 Other specified soft tissue disorders: Secondary | ICD-10-CM

## 2019-09-15 DIAGNOSIS — I739 Peripheral vascular disease, unspecified: Secondary | ICD-10-CM

## 2019-09-15 NOTE — Progress Notes (Signed)
REASON FOR CONSULT:    Discoloration of skin of lower extremities.   ASSESSMENT & PLAN:   DISCOLORATION OF BOTH FEET: Patient has discoloration of both feet and may have some small vessel disease.  However, she has minimal symptoms and has a normal arterial Doppler study with normal toe pressures and normal ABIs.  She has some mild swelling in her feet and legs and I have encouraged her to elevate her legs a couple of times a day.  I have also encouraged her to stay as active as possible we also discussed the importance of nutrition.  I will be happy to see her back in any time if any new vascular issues arise.  Deitra Mayo, MD Office: [phone]   HPI:   Kristin [name] is a pleasant 79 y.o. female, who is referred with discoloration of her lower extremities.  I have reviewed the records from the referring office.  The patient was seen on 07/20/2019.  She had been in the hospital from 07/07/2019 to 07/08/2019 with abdominal pain and a fever.  Urine culture was negative.  She was treated with IV Rocephin and IV fluid.  CT of the abdomen and pelvis showed multiple stones clustered in the right lower pole which measured 4 mm each.  There was no hydronephrosis or ureteral stones noted.  Of note her husband recently died.  She resides at [facility] independent living.  The patient states that she does have some occasional pain in her feet but generally not too bad.  Of note she has had a previous stroke with expressive aphasia but does not good job of answering questions.  She states that she does elevate her legs sometimes and the color in her feet does improve with leg elevation.  She is had no previous history of DVT.  She is not a smoker.  Her risk factors for peripheral vascular disease include hypertension.  She denies any history of diabetes, hypercholesterolemia, family history of premature cardiovascular disease or smoking history.  Past Medical History:  Diagnosis Date  . Arthritis    hands  . CAD (coronary artery disease)    a. 2010: stents pRCA and dRCA. b. LHC 12/13/13: Minimal distal LM dz. LAD 95% ostial w/ L-->R collat. LCx mild dz. RCA 95% mid-dRCA. Patent stents proximal and distal RCA. EF not assessed, Nl by nuclear study.     . Central retinal vein occlusion, right eye   . Chronic kidney disease    hx kidney stone  . Depression   . Diplopia   . Ear pain    right  . Eosinophilic esophagitis   . Essential hypertension   . GERD (gastroesophageal reflux disease)   . H/O: whooping cough   . Hyperlipidemia with target LDL less than 70   . Incontinence    urine  . Stroke [facility]) 2001   reesultant Rt spastic hemplegia and aphasia     Family History  Problem Relation Age of Onset  . Diabetes Mother   . Heart disease Mother   . Heart attack Father   . Breast cancer Sister     SOCIAL HISTORY: Social History   Socioeconomic History  . Marital status: Married    Spouse name: Not on file  . Number of children: 2  . Years of education: Not on file  . Highest education level: Not on file  Occupational History  . Occupation: retired Education officer, museum  Tobacco Use  . Smoking status: Never Smoker  . Smokeless tobacco:  Never Used  Substance and Sexual Activity  . Alcohol use: Yes    Alcohol/week: 0.0 standard drinks    Comment: glass of wine a day  . Drug use: No  . Sexual activity: Never  Other Topics Concern  . Not on file  Social History Narrative  . Not on file   Social Determinants of Health   Financial Resource Strain:   . Difficulty of Paying Living Expenses:   Food Insecurity:   . Worried About Charity fundraiser in the Last Year:   . Arboriculturist in the Last Year:   Transportation Needs:   . Film/video editor (Medical):   Marland Kitchen Lack of Transportation (Non-Medical):   Physical Activity:   . Days of Exercise per Week:   . Minutes of Exercise per Session:   Stress:   . Feeling of Stress :   Social Connections:   . Frequency of  Communication with Friends and Family:   . Frequency of Social Gatherings with Friends and Family:   . Attends Religious Services:   . Active Member of Clubs or Organizations:   . Attends Archivist Meetings:   Marland Kitchen Marital Status:   Intimate Partner Violence:   . Fear of Current or Ex-Partner:   . Emotionally Abused:   Marland Kitchen Physically Abused:   . Sexually Abused:     No Known Allergies  Current Outpatient Medications  Medication Sig Dispense Refill  . aspirin 81 MG tablet Take 81 mg by mouth daily.    Marland Kitchen atorvastatin (LIPITOR) 20 MG tablet TAKE 1 TABLET EACH DAY. 90 tablet 0  . clopidogrel (PLAVIX) 75 MG tablet TAKE 1 TABLET EACH DAY. 90 tablet 0  . Coenzyme Q10 (COQ-10 PO) Take 1 tablet daily by mouth.    . desmopressin (DDAVP) 0.2 MG tablet Take 0.2 mg by mouth at bedtime.     . diclofenac sodium (VOLTAREN) 1 % GEL Apply 4 g topically 4 (four) times daily as needed (pain).     . DULoxetine (CYMBALTA) 20 MG capsule Take 1 capsule (20 mg total) by mouth daily. 90 capsule 1  . gabapentin (NEURONTIN) 300 MG capsule TAKE (1) CAPSULE THREE TIMES DAILY. 90 capsule 3  . losartan (COZAAR) 25 MG tablet TAKE 1 TABLET ONCE DAILY. 90 tablet 0  . mirabegron ER (MYRBETRIQ) 50 MG TB24 tablet Take 50 mg by mouth daily.     . nitroGLYCERIN (NITROSTAT) 0.4 MG SL tablet Place 1 tablet (0.4 mg total) under the tongue every 5 (five) minutes as needed for chest pain. 30 tablet 0  . omeprazole (PRILOSEC) 20 MG capsule TAKE 1 CAPSULE EVERY DAY. 90 capsule 0  . trimethoprim (TRIMPEX) 100 MG tablet Take 1 tablet by mouth daily.    . TURMERIC PO Take 1 tablet by mouth daily.      No current facility-administered medications for this visit.    REVIEW OF SYSTEMS:  [X]  denotes positive finding, [ ]  denotes negative finding Cardiac  Comments:  Chest pain or chest pressure:    Shortness of breath upon exertion:    Short of breath when lying flat:    Irregular heart rhythm:        Vascular    Pain in  calf, thigh, or hip brought on by ambulation:    Pain in feet at night that wakes you up from your sleep:     Blood clot in your veins:    Leg swelling:  Pulmonary    Oxygen at home:    Productive cough:     Wheezing:         Neurologic    Sudden weakness in arms or legs:     Sudden numbness in arms or legs:     Sudden onset of difficulty speaking or slurred speech:    Temporary loss of vision in one eye:     Problems with dizziness:         Gastrointestinal    Blood in stool:     Vomited blood:         Genitourinary    Burning when urinating:     Blood in urine:        Psychiatric    Major depression:         Hematologic    Bleeding problems:    Problems with blood clotting too easily:        Skin    Rashes or ulcers:        Constitutional    Fever or chills:     PHYSICAL EXAM:   Vitals:   09/15/19 0926  BP: 122/76  Pulse: 60  Resp: 20  Temp: (!) 97.4 F (36.3 C)  SpO2: 97%  Weight: 156 lb (70.8 kg)  Height: 5\' 6"  (1.676 m)    GENERAL: The patient is a well-nourished female, in no acute distress. The vital signs are documented above. CARDIAC: There is a regular rate and rhythm.  VASCULAR: I do not detect carotid bruits. I cannot palpate pedal pulses however she has triphasic Doppler signals in the dorsalis pedis and posterior tibial positions bilaterally. She has mild swelling in her feet with some slight discoloration of her feet. Of note I examined her in the wheelchair.  This limited the exam somewhat. PULMONARY: There is good air exchange bilaterally without wheezing or rales. ABDOMEN: Soft and non-tender with normal pitched bowel sounds.  MUSCULOSKELETAL: There are no major deformities or cyanosis. NEUROLOGIC: No focal weakness or paresthesias are detected. SKIN: There are no ulcers or rashes noted. PSYCHIATRIC: The patient has a normal affect.  DATA:    ARTERIAL DOPPLER STUDY: I have independently interpreted her arterial Doppler study.   On the right side there is a triphasic dorsalis pedis and posterior tibial signal.  ABIs 95%.  Toe pressure is 89 mmHg.  On the left side there is a triphasic dorsalis pedis and posterior tibial signal.  ABI is 100%.  Toe pressure is 94 mmHg.

## 2019-09-16 DIAGNOSIS — R262 Difficulty in walking, not elsewhere classified: Secondary | ICD-10-CM | POA: Diagnosis not present

## 2019-09-16 DIAGNOSIS — R4701 Aphasia: Secondary | ICD-10-CM | POA: Diagnosis not present

## 2019-09-16 DIAGNOSIS — I69398 Other sequelae of cerebral infarction: Secondary | ICD-10-CM | POA: Diagnosis not present

## 2019-09-16 DIAGNOSIS — M25511 Pain in right shoulder: Secondary | ICD-10-CM | POA: Diagnosis not present

## 2019-09-16 DIAGNOSIS — R278 Other lack of coordination: Secondary | ICD-10-CM | POA: Diagnosis not present

## 2019-09-16 DIAGNOSIS — R2681 Unsteadiness on feet: Secondary | ICD-10-CM | POA: Diagnosis not present

## 2019-09-16 DIAGNOSIS — M79604 Pain in right leg: Secondary | ICD-10-CM | POA: Diagnosis not present

## 2019-09-16 DIAGNOSIS — M6281 Muscle weakness (generalized): Secondary | ICD-10-CM | POA: Diagnosis not present

## 2019-09-17 DIAGNOSIS — I69398 Other sequelae of cerebral infarction: Secondary | ICD-10-CM | POA: Diagnosis not present

## 2019-09-17 DIAGNOSIS — R262 Difficulty in walking, not elsewhere classified: Secondary | ICD-10-CM | POA: Diagnosis not present

## 2019-09-17 DIAGNOSIS — M79604 Pain in right leg: Secondary | ICD-10-CM | POA: Diagnosis not present

## 2019-09-17 DIAGNOSIS — R4701 Aphasia: Secondary | ICD-10-CM | POA: Diagnosis not present

## 2019-09-17 DIAGNOSIS — R2681 Unsteadiness on feet: Secondary | ICD-10-CM | POA: Diagnosis not present

## 2019-09-17 DIAGNOSIS — M25511 Pain in right shoulder: Secondary | ICD-10-CM | POA: Diagnosis not present

## 2019-09-17 DIAGNOSIS — R278 Other lack of coordination: Secondary | ICD-10-CM | POA: Diagnosis not present

## 2019-09-17 DIAGNOSIS — M6281 Muscle weakness (generalized): Secondary | ICD-10-CM | POA: Diagnosis not present

## 2019-09-21 DIAGNOSIS — M6281 Muscle weakness (generalized): Secondary | ICD-10-CM | POA: Diagnosis not present

## 2019-09-21 DIAGNOSIS — R278 Other lack of coordination: Secondary | ICD-10-CM | POA: Diagnosis not present

## 2019-09-21 DIAGNOSIS — R2681 Unsteadiness on feet: Secondary | ICD-10-CM | POA: Diagnosis not present

## 2019-09-21 DIAGNOSIS — I69398 Other sequelae of cerebral infarction: Secondary | ICD-10-CM | POA: Diagnosis not present

## 2019-09-21 DIAGNOSIS — R4701 Aphasia: Secondary | ICD-10-CM | POA: Diagnosis not present

## 2019-09-21 DIAGNOSIS — M79604 Pain in right leg: Secondary | ICD-10-CM | POA: Diagnosis not present

## 2019-09-21 DIAGNOSIS — R262 Difficulty in walking, not elsewhere classified: Secondary | ICD-10-CM | POA: Diagnosis not present

## 2019-09-21 DIAGNOSIS — M25511 Pain in right shoulder: Secondary | ICD-10-CM | POA: Diagnosis not present

## 2019-09-23 DIAGNOSIS — M79604 Pain in right leg: Secondary | ICD-10-CM | POA: Diagnosis not present

## 2019-09-23 DIAGNOSIS — R262 Difficulty in walking, not elsewhere classified: Secondary | ICD-10-CM | POA: Diagnosis not present

## 2019-09-23 DIAGNOSIS — R278 Other lack of coordination: Secondary | ICD-10-CM | POA: Diagnosis not present

## 2019-09-23 DIAGNOSIS — M25511 Pain in right shoulder: Secondary | ICD-10-CM | POA: Diagnosis not present

## 2019-09-23 DIAGNOSIS — I69398 Other sequelae of cerebral infarction: Secondary | ICD-10-CM | POA: Diagnosis not present

## 2019-09-23 DIAGNOSIS — M6281 Muscle weakness (generalized): Secondary | ICD-10-CM | POA: Diagnosis not present

## 2019-09-23 DIAGNOSIS — R2681 Unsteadiness on feet: Secondary | ICD-10-CM | POA: Diagnosis not present

## 2019-09-23 DIAGNOSIS — R4701 Aphasia: Secondary | ICD-10-CM | POA: Diagnosis not present

## 2019-09-24 DIAGNOSIS — M79604 Pain in right leg: Secondary | ICD-10-CM | POA: Diagnosis not present

## 2019-09-24 DIAGNOSIS — R262 Difficulty in walking, not elsewhere classified: Secondary | ICD-10-CM | POA: Diagnosis not present

## 2019-09-24 DIAGNOSIS — M25511 Pain in right shoulder: Secondary | ICD-10-CM | POA: Diagnosis not present

## 2019-09-24 DIAGNOSIS — M6281 Muscle weakness (generalized): Secondary | ICD-10-CM | POA: Diagnosis not present

## 2019-09-24 DIAGNOSIS — R4701 Aphasia: Secondary | ICD-10-CM | POA: Diagnosis not present

## 2019-09-24 DIAGNOSIS — R278 Other lack of coordination: Secondary | ICD-10-CM | POA: Diagnosis not present

## 2019-09-24 DIAGNOSIS — I69398 Other sequelae of cerebral infarction: Secondary | ICD-10-CM | POA: Diagnosis not present

## 2019-09-24 DIAGNOSIS — R2681 Unsteadiness on feet: Secondary | ICD-10-CM | POA: Diagnosis not present

## 2019-09-26 ENCOUNTER — Other Ambulatory Visit: Payer: Self-pay | Admitting: Internal Medicine

## 2019-09-26 DIAGNOSIS — M792 Neuralgia and neuritis, unspecified: Secondary | ICD-10-CM

## 2019-09-27 ENCOUNTER — Other Ambulatory Visit: Payer: Self-pay | Admitting: Internal Medicine

## 2019-09-27 ENCOUNTER — Other Ambulatory Visit: Payer: Self-pay

## 2019-09-27 DIAGNOSIS — M792 Neuralgia and neuritis, unspecified: Secondary | ICD-10-CM

## 2019-09-27 MED ORDER — DULOXETINE HCL 20 MG PO CPEP: 20.0000 mg | ORAL_CAPSULE | Freq: Every day | ORAL | 1 refills | Status: DC

## 2019-09-27 NOTE — Telephone Encounter (Signed)
Incoming call received from the pharmacist indicating patient need a script for Cymbalta to get her on [facility] where all her medications are being filled at the same time.  When we approved rx back in February, the whole supply was not dispensed in effort to get fill dates on the same schedule and now its off track and a 90 day rx will set her straight.  RX approved as requested

## 2019-09-27 NOTE — Telephone Encounter (Signed)
High risk or very high risk warning populated when attempting to refill medication. RX request sent to PCP for review and approval if warranted.   

## 2019-09-28 ENCOUNTER — Encounter: Payer: Self-pay | Admitting: Internal Medicine

## 2019-09-28 DIAGNOSIS — R4701 Aphasia: Secondary | ICD-10-CM | POA: Diagnosis not present

## 2019-09-28 DIAGNOSIS — R2681 Unsteadiness on feet: Secondary | ICD-10-CM | POA: Diagnosis not present

## 2019-09-28 DIAGNOSIS — R262 Difficulty in walking, not elsewhere classified: Secondary | ICD-10-CM | POA: Diagnosis not present

## 2019-09-28 DIAGNOSIS — M79604 Pain in right leg: Secondary | ICD-10-CM | POA: Diagnosis not present

## 2019-09-28 DIAGNOSIS — M25511 Pain in right shoulder: Secondary | ICD-10-CM | POA: Diagnosis not present

## 2019-09-28 DIAGNOSIS — M6281 Muscle weakness (generalized): Secondary | ICD-10-CM | POA: Diagnosis not present

## 2019-09-28 DIAGNOSIS — R278 Other lack of coordination: Secondary | ICD-10-CM | POA: Diagnosis not present

## 2019-09-28 DIAGNOSIS — I69398 Other sequelae of cerebral infarction: Secondary | ICD-10-CM | POA: Diagnosis not present

## 2019-09-28 MED ORDER — ATORVASTATIN CALCIUM 20 MG PO TABS: 20.0000 mg | ORAL_TABLET | Freq: Every day | ORAL | 0 refills | Status: DC

## 2019-09-29 DIAGNOSIS — R4701 Aphasia: Secondary | ICD-10-CM | POA: Diagnosis not present

## 2019-09-29 DIAGNOSIS — I69398 Other sequelae of cerebral infarction: Secondary | ICD-10-CM | POA: Diagnosis not present

## 2019-09-29 DIAGNOSIS — R2681 Unsteadiness on feet: Secondary | ICD-10-CM | POA: Diagnosis not present

## 2019-09-29 DIAGNOSIS — R262 Difficulty in walking, not elsewhere classified: Secondary | ICD-10-CM | POA: Diagnosis not present

## 2019-09-29 DIAGNOSIS — M25511 Pain in right shoulder: Secondary | ICD-10-CM | POA: Diagnosis not present

## 2019-09-29 DIAGNOSIS — M79604 Pain in right leg: Secondary | ICD-10-CM | POA: Diagnosis not present

## 2019-09-29 DIAGNOSIS — M6281 Muscle weakness (generalized): Secondary | ICD-10-CM | POA: Diagnosis not present

## 2019-09-29 DIAGNOSIS — R278 Other lack of coordination: Secondary | ICD-10-CM | POA: Diagnosis not present

## 2019-10-01 DIAGNOSIS — R262 Difficulty in walking, not elsewhere classified: Secondary | ICD-10-CM | POA: Diagnosis not present

## 2019-10-01 DIAGNOSIS — M79604 Pain in right leg: Secondary | ICD-10-CM | POA: Diagnosis not present

## 2019-10-01 DIAGNOSIS — I69398 Other sequelae of cerebral infarction: Secondary | ICD-10-CM | POA: Diagnosis not present

## 2019-10-01 DIAGNOSIS — R2681 Unsteadiness on feet: Secondary | ICD-10-CM | POA: Diagnosis not present

## 2019-10-01 DIAGNOSIS — R4701 Aphasia: Secondary | ICD-10-CM | POA: Diagnosis not present

## 2019-10-01 DIAGNOSIS — R278 Other lack of coordination: Secondary | ICD-10-CM | POA: Diagnosis not present

## 2019-10-01 DIAGNOSIS — M25511 Pain in right shoulder: Secondary | ICD-10-CM | POA: Diagnosis not present

## 2019-10-01 DIAGNOSIS — M6281 Muscle weakness (generalized): Secondary | ICD-10-CM | POA: Diagnosis not present

## 2019-10-04 DIAGNOSIS — R2681 Unsteadiness on feet: Secondary | ICD-10-CM | POA: Diagnosis not present

## 2019-10-04 DIAGNOSIS — M79604 Pain in right leg: Secondary | ICD-10-CM | POA: Diagnosis not present

## 2019-10-04 DIAGNOSIS — R4701 Aphasia: Secondary | ICD-10-CM | POA: Diagnosis not present

## 2019-10-04 DIAGNOSIS — R262 Difficulty in walking, not elsewhere classified: Secondary | ICD-10-CM | POA: Diagnosis not present

## 2019-10-04 DIAGNOSIS — I69398 Other sequelae of cerebral infarction: Secondary | ICD-10-CM | POA: Diagnosis not present

## 2019-10-04 DIAGNOSIS — M6281 Muscle weakness (generalized): Secondary | ICD-10-CM | POA: Diagnosis not present

## 2019-10-04 DIAGNOSIS — R278 Other lack of coordination: Secondary | ICD-10-CM | POA: Diagnosis not present

## 2019-10-04 DIAGNOSIS — M25511 Pain in right shoulder: Secondary | ICD-10-CM | POA: Diagnosis not present

## 2019-10-05 DIAGNOSIS — R4701 Aphasia: Secondary | ICD-10-CM | POA: Diagnosis not present

## 2019-10-08 DIAGNOSIS — R4701 Aphasia: Secondary | ICD-10-CM | POA: Diagnosis not present

## 2019-10-12 DIAGNOSIS — R4701 Aphasia: Secondary | ICD-10-CM | POA: Diagnosis not present

## 2019-10-13 DIAGNOSIS — R4701 Aphasia: Secondary | ICD-10-CM | POA: Diagnosis not present

## 2019-10-14 DIAGNOSIS — R4701 Aphasia: Secondary | ICD-10-CM | POA: Diagnosis not present

## 2019-10-19 ENCOUNTER — Other Ambulatory Visit: Payer: Self-pay

## 2019-10-19 ENCOUNTER — Encounter: Payer: Medicare PPO | Attending: Physical Medicine and Rehabilitation | Admitting: Physical Medicine and Rehabilitation

## 2019-10-19 ENCOUNTER — Encounter: Payer: Self-pay | Admitting: Physical Medicine and Rehabilitation

## 2019-10-19 VITALS — BP 106/66 | HR 70 | Temp 96.7°F | Ht 66.0 in

## 2019-10-19 DIAGNOSIS — M792 Neuralgia and neuritis, unspecified: Secondary | ICD-10-CM | POA: Diagnosis not present

## 2019-10-19 DIAGNOSIS — M79672 Pain in left foot: Secondary | ICD-10-CM | POA: Insufficient documentation

## 2019-10-19 DIAGNOSIS — M7522 Bicipital tendinitis, left shoulder: Secondary | ICD-10-CM | POA: Diagnosis not present

## 2019-10-19 DIAGNOSIS — S46002S Unspecified injury of muscle(s) and tendon(s) of the rotator cuff of left shoulder, sequela: Secondary | ICD-10-CM | POA: Diagnosis not present

## 2019-10-19 DIAGNOSIS — G8110 Spastic hemiplegia affecting unspecified side: Secondary | ICD-10-CM | POA: Diagnosis not present

## 2019-10-19 DIAGNOSIS — M1711 Unilateral primary osteoarthritis, right knee: Secondary | ICD-10-CM | POA: Insufficient documentation

## 2019-10-19 DIAGNOSIS — I69320 Aphasia following cerebral infarction: Secondary | ICD-10-CM | POA: Insufficient documentation

## 2019-10-19 MED ORDER — GABAPENTIN 300 MG PO CAPS: 300.0000 mg | ORAL_CAPSULE | Freq: Three times a day (TID) | ORAL | 3 refills | Status: DC

## 2019-10-19 NOTE — Progress Notes (Signed)
Subjective:    Patient ID: Kristin [name], female    DOB: Jul 16, 1940, 79 y.o.   MRN: [id number]  HPI I am meeting with the patient today for follow-up of left MCA infarct and associated deficits. Her daughter reports that she has not been moving as much as she had been previously prior to COVID due to some of the restrictions going on. She is not doing as many classes etc. She has been getting up some. However, daughter has seen some decline in her functional mobility.  Pain wise she is having increasing pain in her left shoulder. She has benefitted from corticosteroid injections in the past and would like to get another one. Her last one was 1 year ago. Her pain in her right arm and leg is much improved since increasing her Gabapentin to 300mg  TID. She continues on this dose. She needs a refill today.  The patient demonstrated some of the stretches she is doing on her own. This does not make her drowsy.  She has been having some left foot pain in the pad of her foot. Notes swelling as well. She does follow with a podiatrist.   Mood has been good. She is not eating as well as usual since she has less communal dining events at her center.             She is not involved in any physical therapy but tries to stretch out her hand herself. She is able to ambulate within her room and use the rest room. She has a lot of joy in her life in living in the center. She loves listening to music.              Mrs. Baltimore has continued outpatient follow-up with urology and continues on DDAVP and Myrbetriq for urinary incontinence. Her sleep continues to be disturbed as a result of this.   Pain Inventory Average Pain 5 Pain Right Now 8 My pain is intermittent and sharp  In the last 24 hours, has pain interfered with the following? General activity 0 Relation with others 0 Enjoyment of life 5 What TIME of day is your pain at its worst? night Sleep (in general) Fair  Pain is worse with: walking and  sitting Pain improves with: medication Relief from Meds: 6  Mobility walk with assistance use a walker how many minutes can you walk? 10 mins ability to climb steps?  no do you drive?  no use a wheelchair Do you have any goals in this area?  yes  Function retired I need assistance with the following:  meal prep, household duties and shopping Do you have any goals in this area?  yes  Neuro/Psych bladder control problems weakness trouble walking depression  Prior Studies Any changes since last visit?  yes Chest xray in March.  Physicians involved in your care Any changes since last visit?  no   Family History  Problem Relation Age of Onset  . Diabetes Mother   . Heart disease Mother   . Heart attack Father   . Breast cancer Sister    Social History   Socioeconomic History  . Marital status: Married    Spouse name: Not on file  . Number of children: 2  . Years of education: Not on file  . Highest education level: Not on file  Occupational History  . Occupation: retired Education officer, museum  Tobacco Use  . Smoking status: Never Smoker  . Smokeless tobacco: Never Used  Vaping Use  .  Vaping Use: Never used  Substance and Sexual Activity  . Alcohol use: Yes    Alcohol/week: 0.0 standard drinks    Comment: glass of wine a day  . Drug use: No  . Sexual activity: Never  Other Topics Concern  . Not on file  Social History Narrative  . Not on file   Social Determinants of Health   Financial Resource Strain:   . Difficulty of Paying Living Expenses:   Food Insecurity:   . Worried About Charity fundraiser in the Last Year:   . Arboriculturist in the Last Year:   Transportation Needs:   . Film/video editor (Medical):   Marland Kitchen Lack of Transportation (Non-Medical):   Physical Activity:   . Days of Exercise per Week:   . Minutes of Exercise per Session:   Stress:   . Feeling of Stress :   Social Connections:   . Frequency of Communication with Friends and  Family:   . Frequency of Social Gatherings with Friends and Family:   . Attends Religious Services:   . Active Member of Clubs or Organizations:   . Attends Archivist Meetings:   Marland Kitchen Marital Status:    Past Surgical History:  Procedure Laterality Date  . ABDOMINAL HYSTERECTOMY    . CAROTID STENT    . cataracts    . COLONOSCOPY N/A 10/05/2015   Procedure: COLONOSCOPY;  Surgeon: Manus Gunning, MD;  Location: [facility];  Service: Gastroenterology;  Laterality: N/A;  . CORONARY ARTERY BYPASS GRAFT N/A 12/17/2013   Procedure: CORONARY ARTERY BYPASS GRAFTING (CABG);  Surgeon: Gaye Pollack, MD;  Location: [facility];  Service: Open Heart Surgery;  Laterality: N/A;  Times 2 using left internal mammary artery and endoscopically harvested right saphenous vein  . ESOPHAGOGASTRODUODENOSCOPY    . ESOPHAGOGASTRODUODENOSCOPY N/A 10/05/2015   Procedure: ESOPHAGOGASTRODUODENOSCOPY (EGD);  Surgeon: Manus Gunning, MD;  Location: [facility];  Service: Gastroenterology;  Laterality: N/A;  . INTRAOPERATIVE TRANSESOPHAGEAL ECHOCARDIOGRAM N/A 12/17/2013   Procedure: INTRAOPERATIVE TRANSESOPHAGEAL ECHOCARDIOGRAM;  Surgeon: Gaye Pollack, MD;  Location: [facility];  Service: Open Heart Surgery;  Laterality: N/A;  . lasix    . LEFT HEART CATHETERIZATION WITH CORONARY ANGIOGRAM N/A 12/13/2013   Procedure: LEFT HEART CATHETERIZATION WITH CORONARY ANGIOGRAM;  Surgeon: Jettie Booze, MD;  Location: [facility] CATH LAB;  Service: Cardiovascular;  Laterality: N/A;  . retina injection     Past Medical History:  Diagnosis Date  . Arthritis    hands  . CAD (coronary artery disease)    a. 2010: stents pRCA and dRCA. b. LHC 12/13/13: Minimal distal LM dz. LAD 95% ostial w/ L-->R collat. LCx mild dz. RCA 95% mid-dRCA. Patent stents proximal and distal RCA. EF not assessed, Nl by nuclear study.     . Central retinal vein occlusion, right eye   . Chronic kidney disease    hx kidney stone  . Depression    . Diplopia   . Ear pain    right  . Eosinophilic esophagitis   . Essential hypertension   . GERD (gastroesophageal reflux disease)   . H/O: whooping cough   . Hyperlipidemia with target LDL less than 70   . Incontinence    urine  . Stroke ([place]) 2001   reesultant Rt spastic hemplegia and aphasia    BP 106/66   Pulse 70   Temp (!) 96.7 F (35.9 C)   Ht 5\' 6"  (1.676 m)   SpO2 93%  BMI 25.18 kg/m   Opioid Risk Score:   Fall Risk Score:  `1  Depression screen PHQ 2/9  Depression screen [facility] 2/9 10/19/2019 07/20/2019 06/03/2019 04/08/2019 08/26/2017 11/11/2014  Decreased Interest - 0 0 0 0 0  Down, Depressed, Hopeless 2 0 0 0 0 0  PHQ - 2 Score 2 0 0 0 0 0  Altered sleeping 0 - - - - -  Tired, decreased energy 1 - - - - -  Change in appetite 0 - - - - -  Feeling bad or failure about yourself  0 - - - - -  Trouble concentrating 1 - - - - -  Moving slowly or fidgety/restless 1 - - - - -  Suicidal thoughts 0 - - - - -  PHQ-9 Score 5 - - - - -   Review of Systems  Constitutional: Negative.   HENT: Negative.   Eyes: Negative.   Respiratory: Negative.   Cardiovascular: Negative.   Endocrine: Negative.   Genitourinary: Positive for urgency.  Musculoskeletal: Positive for gait problem.       LEFT SHOULDER PAIN LEFT FOOT PAIN  Skin:       TENDER SPOT BOTTOM OF LEFT FOOT  Allergic/Immunologic: Negative.   Neurological: Positive for weakness.  Hematological: Negative.   Psychiatric/Behavioral:       DEPRESSION  ANXIETY       Objective:   Physical Exam Gen: no distress, normal appearing HEENT: oral mucosa pink and moist, NCAT Cardio: Reg rate Chest: normal effort, normal rate of breathing Abd: soft, non-distended Ext: no edema Skin: left foot pad with swelling and appears to have splinter with associated palpable mass in middle of foot pad.  Neuro:Patient communicates through short phrases and words as well as head nods. Primarily expressive aphasia; she understands  very well and follows commands well. Right upper extremity with minimal voluntary movement. She has 2+ to 3 out of 5 hip flexor and knee extensor strength right lower extremity. Right ankle in splint with minimal active movement. Left upper extremity and left lower extremity grossly 4+ to 5 out of 5.  Left upper extremity with shoulder tenderness.  Musculoskeletal:ongoing sublux.Minimal pain with right knee range of motion today, passive or active..Right hamstrings tight. Psych:Pt's affect is pleasant    Assessment & Plan:  1. Hx of left MCA infarct with spastic right hemiparesis, expressive greater than receptive aphasia  2. .Primary osteoarthritis right knee 4. Insomnia, anxiety  5. Constipation 6. Right shoulder pain related to chronic subluxation, mild adhesive capsulitis, chronic spasticity  7. OSA.  8. Left rotator cuff tendonitis/bursitis;biciptial tendonitis--generally improved. 9. Right rotator cuff tendonitis/right shoulder sublux 10.Right sided neuropathic pain 11. NON-healing left medial ankle wound.   Plan:  1.Can continue daily ambulation and exercise at facility. Focus should be on mechanics and strengthening of the lower extremities, particularly the right side. Also needs to improve hamstring flexibility.  2.Continue Gabapentin dose 300mg  TID for pain control. Provided refill.  3. Can stop right shoulder harness which is providing minimal benefit.  4. I will see her back in2 weeks for left sided corticosteroid injection for bursitis.  5. Ice to left foot swelling and follow-up with podiatry as may need splinter removal.   All questions answered. RTC in 2 weeks.

## 2019-10-21 ENCOUNTER — Ambulatory Visit (INDEPENDENT_AMBULATORY_CARE_PROVIDER_SITE_OTHER): Payer: Medicare PPO | Admitting: Internal Medicine

## 2019-10-21 ENCOUNTER — Other Ambulatory Visit: Payer: Self-pay

## 2019-10-21 ENCOUNTER — Encounter: Payer: Self-pay | Admitting: Internal Medicine

## 2019-10-21 VITALS — BP 122/82 | HR 68 | Temp 97.5°F | Ht 66.0 in | Wt 161.8 lb

## 2019-10-21 DIAGNOSIS — G8110 Spastic hemiplegia affecting unspecified side: Secondary | ICD-10-CM

## 2019-10-21 DIAGNOSIS — I10 Essential (primary) hypertension: Secondary | ICD-10-CM

## 2019-10-21 DIAGNOSIS — Z0000 Encounter for general adult medical examination without abnormal findings: Secondary | ICD-10-CM | POA: Diagnosis not present

## 2019-10-21 DIAGNOSIS — M12812 Other specific arthropathies, not elsewhere classified, left shoulder: Secondary | ICD-10-CM | POA: Diagnosis not present

## 2019-10-21 DIAGNOSIS — M4802 Spinal stenosis, cervical region: Secondary | ICD-10-CM | POA: Diagnosis not present

## 2019-10-21 DIAGNOSIS — I69320 Aphasia following cerebral infarction: Secondary | ICD-10-CM

## 2019-10-21 DIAGNOSIS — M75102 Unspecified rotator cuff tear or rupture of left shoulder, not specified as traumatic: Secondary | ICD-10-CM

## 2019-10-21 DIAGNOSIS — R350 Frequency of micturition: Secondary | ICD-10-CM | POA: Diagnosis not present

## 2019-10-21 DIAGNOSIS — E785 Hyperlipidemia, unspecified: Secondary | ICD-10-CM | POA: Diagnosis not present

## 2019-10-21 DIAGNOSIS — I6330 Cerebral infarction due to thrombosis of unspecified cerebral artery: Secondary | ICD-10-CM | POA: Diagnosis not present

## 2019-10-21 NOTE — Progress Notes (Signed)
Provider:  Rexene Edison. Mariea Clonts, D.O., C.M.D. Location:   [facility]  Place of Service:   clinic  Previous PCP: Gayland Curry, DO Patient Care Team: Gayland Curry, DO as PCP - General (Geriatric Medicine) Armbruster, Carlota Raspberry, MD as Consulting Physician (Gastroenterology) Meredith Staggers, MD as Consulting Physician (Physical Medicine and Rehabilitation) Register, Luetta Nutting, PA-C as Physician Assistant (Dermatology) Marzetta Board, DPM as Consulting Physician (Podiatry) Hayden Pedro, MD as Consulting Physician (Ophthalmology) Domingo Pulse, MD (Urology)  Extended Emergency Contact Information Primary Emergency Contact: Adah Salvage, [place] [address] Johnnette Litter of [place] Phone: [phone] Relation: Daughter Secondary Emergency Contact: Jonna Munro [place] Mobile Phone: [phone] Relation: Relative  Code Status: DNR Goals of Care: Advanced Directive information Advanced Directives 10/21/2019  Does Patient Have a Medical Advance Directive? Yes  Type of Advance Directive Living will;Healthcare Power of [place];Out of facility DNR (pink MOST or yellow form)  Does patient want to make changes to medical advance directive? No - Patient declined  Copy of [place] in Chart? No - copy requested  Would patient like information on creating a medical advance directive? -  Pre-existing out of facility DNR order (yellow form or pink MOST form) -   Chief Complaint  Patient presents with  . Annual Exam    Annual Physical Exam     HPI: Patient is a 79 y.o. female seen today for an annual physical exam.    She has done very well.  Moved into a smaller place at [place] since her husband passed away.  Still participging in musci programs.  She had some PT, OT, ST.  Her communication book was updated.    She had an appt with PMR Tuesday due to right arm   Left foot foreign body in it.    Left shoulder injection  upcoming.  Having urinary frequency and increased incontinence.    PMR has also noted bp running low and wondered about her coming off losartan which was for proteinuria.  BP 122/82.  [name] will check her bp at home if we stop it.  Past Medical History:  Diagnosis Date  . Arthritis    hands  . CAD (coronary artery disease)    a. 2010: stents pRCA and dRCA. b. LHC 12/13/13: Minimal distal LM dz. LAD 95% ostial w/ L-->R collat. LCx mild dz. RCA 95% mid-dRCA. Patent stents proximal and distal RCA. EF not assessed, Nl by nuclear study.     . Central retinal vein occlusion, right eye   . Chronic kidney disease    hx kidney stone  . Depression   . Diplopia   . Ear pain    right  . Eosinophilic esophagitis   . Essential hypertension   . GERD (gastroesophageal reflux disease)   . H/O: whooping cough   . Hyperlipidemia with target LDL less than 70   . Incontinence    urine  . Stroke [facility]) 2001   reesultant Rt spastic hemplegia and aphasia    Past Surgical History:  Procedure Laterality Date  . ABDOMINAL HYSTERECTOMY    . CAROTID STENT    . cataracts    . COLONOSCOPY N/A 10/05/2015   Procedure: COLONOSCOPY;  Surgeon: Manus Gunning, MD;  Location: [facility];  Service: Gastroenterology;  Laterality: N/A;  . CORONARY ARTERY BYPASS GRAFT N/A 12/17/2013   Procedure: CORONARY ARTERY BYPASS GRAFTING (CABG);  Surgeon: Gaye Pollack, MD;  Location: [facility];  Service: Open Heart Surgery;  Laterality: N/A;  Times 2 using left internal mammary artery and endoscopically harvested right saphenous vein  . ESOPHAGOGASTRODUODENOSCOPY    . ESOPHAGOGASTRODUODENOSCOPY N/A 10/05/2015   Procedure: ESOPHAGOGASTRODUODENOSCOPY (EGD);  Surgeon: Manus Gunning, MD;  Location: [facility];  Service: Gastroenterology;  Laterality: N/A;  . INTRAOPERATIVE TRANSESOPHAGEAL ECHOCARDIOGRAM N/A 12/17/2013   Procedure: INTRAOPERATIVE TRANSESOPHAGEAL ECHOCARDIOGRAM;  Surgeon: Gaye Pollack, MD;  Location:  [facility];  Service: Open Heart Surgery;  Laterality: N/A;  . lasix    . LEFT HEART CATHETERIZATION WITH CORONARY ANGIOGRAM N/A 12/13/2013   Procedure: LEFT HEART CATHETERIZATION WITH CORONARY ANGIOGRAM;  Surgeon: Jettie Booze, MD;  Location: [facility] C[facility] LAB;  Service: Cardiovascular;  Laterality: N/A;  . retina injection      reports that she has never smoked. She has never used smokeless tobacco. She reports current alcohol use. She reports that she does not use drugs.  Functional Status Survey:    Family History  Problem Relation Age of Onset  . Diabetes Mother   . Heart disease Mother   . Heart attack Father   . Breast cancer Sister     Health Maintenance  Topic Date Due  . DEXA SCAN  Never done  . PNA vac Low Risk Adult (2 of 2 - PCV13) 05/12/2014  . INFLUENZA VACCINE  11/21/2019  . TETANUS/TDAP  05/13/2023  . COVID-19 Vaccine  Completed    No Known Allergies  Outpatient Encounter Medications as of 10/21/2019  Medication Sig  . aspirin 81 MG tablet Take 81 mg by mouth daily.  Marland Kitchen atorvastatin (LIPITOR) 20 MG tablet Take 1 tablet (20 mg total) by mouth daily.  . clopidogrel (PLAVIX) 75 MG tablet TAKE 1 TABLET EACH DAY.  Marland Kitchen Coenzyme Q10 (COQ-10 PO) Take 1 tablet daily by mouth.  . desmopressin (DDAVP) 0.2 MG tablet Take 0.2 mg by mouth at bedtime.   . diclofenac sodium (VOLTAREN) 1 % GEL Apply 4 g topically 4 (four) times daily as needed (pain).   . DULoxetine (CYMBALTA) 20 MG capsule Take 1 capsule (20 mg total) by mouth daily.  Marland Kitchen gabapentin (NEURONTIN) 300 MG capsule TAKE (1) CAPSULE THREE TIMES DAILY.  Marland Kitchen losartan (COZAAR) 25 MG tablet TAKE 1 TABLET ONCE DAILY.  . mirabegron ER (MYRBETRIQ) 50 MG TB24 tablet Take 50 mg by mouth daily.   . nitroGLYCERIN (NITROSTAT) 0.4 MG SL tablet Place 1 tablet (0.4 mg total) under the tongue every 5 (five) minutes as needed for chest pain.  Marland Kitchen omeprazole (PRILOSEC) 20 MG capsule TAKE 1 CAPSULE EVERY DAY.  Marland Kitchen trimethoprim (TRIMPEX) 100 MG  tablet Take 1 tablet by mouth daily.  . TURMERIC PO Take 1 tablet by mouth daily.   . [DISCONTINUED] gabapentin (NEURONTIN) 300 MG capsule Take 1 capsule (300 mg total) by mouth 3 (three) times daily.   No facility-administered encounter medications on file as of 10/21/2019.    Review of Systems  Constitutional: Negative for chills, fever and malaise/fatigue.  HENT: Negative for congestion and hearing loss.   Eyes: Negative for blurred vision.       Does have retinal disease and follows with Dr. Zigmund Daniel, et al  Respiratory: Negative for cough and shortness of breath.   Cardiovascular: Positive for chest pain and leg swelling. Negative for palpitations, orthopnea, claudication and PND.       Mild edema; chest wall pain   Gastrointestinal: Negative for abdominal pain, blood in stool, constipation, diarrhea and melena.  Genitourinary:  Positive for frequency. Negative for dysuria and urgency.  Musculoskeletal: Positive for joint pain and myalgias. Negative for falls.       Chest wall myalgias, left shoulder pain  Skin: Negative for itching and rash.  Neurological: Positive for sensory change and focal weakness. Negative for dizziness, seizures, loss of consciousness, weakness and headaches.  Psychiatric/Behavioral: Negative for depression and suicidal ideas. The patient is not nervous/anxious and does not have insomnia.     Vitals:   10/21/19 0901  BP: 122/82  Pulse: 68  Temp: (!) 97.5 F (36.4 C)  TempSrc: Temporal  SpO2: 97%  Weight: 161 lb 12.8 oz (73.4 kg)  Height: 5\' 6"  (1.676 m)   Body mass index is 26.12 kg/m. Physical Exam Vitals reviewed.  Constitutional:      General: She is not in acute distress.    Appearance: Normal appearance. She is not toxic-appearing.  HENT:     Head: Normocephalic and atraumatic.     Right Ear: Tympanic membrane, ear canal and external ear normal.     Left Ear: Tympanic membrane, ear canal and external ear normal.     Nose:     Comments:  covid masking in place Eyes:     Extraocular Movements: Extraocular movements intact.     Conjunctiva/sclera: Conjunctivae normal.     Pupils: Pupils are equal, round, and reactive to light.  Cardiovascular:     Comments: Soft heart sounds Pulmonary:     Effort: Pulmonary effort is normal.     Breath sounds: Normal breath sounds. No wheezing, rhonchi or rales.  Chest:     Comments: declined breast exam Abdominal:     General: Bowel sounds are normal. There is no distension.     Palpations: Abdomen is soft.     Tenderness: There is no abdominal tenderness. There is no guarding or rebound.  Musculoskeletal:     Cervical back: Neck supple.     Right lower leg: Edema present.     Left lower leg: Edema present.     Comments: Right spastic hemiparesis with AFO on right ankle; left shoulder decreased ROM due to pain; nonpitting edema  Lymphadenopathy:     Cervical: No cervical adenopathy.  Skin:    General: Skin is warm and dry.     Capillary Refill: Capillary refill takes less than 2 seconds.  Neurological:     Mental Status: She is alert and oriented to person, place, and time.     Cranial Nerves: Cranial nerve deficit present.     Sensory: Sensory deficit present.     Gait: Gait abnormal.     Deep Tendon Reflexes: Reflexes abnormal.     Comments: Aphasic and dysarthric speech; using manual wheelchair  Psychiatric:        Mood and Affect: Mood normal.        Behavior: Behavior normal.     Labs reviewed: Basic Metabolic Panel: Recent Labs    07/07/19 0124 07/08/19 0505 07/20/19 1356  NA 137 136 140  K 4.7 3.0* 4.1  CL 101 105 105  CO2 27 24 26   GLUCOSE 143* 108* 109  BUN 19 10 23   CREATININE 0.61 0.42* 0.61  CALCIUM 8.8* 8.1* 9.1  MG  --  1.9  --    Liver Function Tests: Recent Labs    10/29/18 0000 07/07/19 0124  AST 14 54*  ALT 10 43  ALKPHOS 80 86  BILITOT  --  1.6*  PROT  --  7.5  ALBUMIN 4.1  4.0   Recent Labs    07/07/19 0124  LIPASE 17   No  results for input(s): AMMONIA in the last 8760 hours. CBC: Recent Labs    07/07/19 0128 07/08/19 0505 07/20/19 1356  WBC 10.3 4.4 6.0  NEUTROABS 8.6*  --  3,912  HGB 15.0 12.9 14.1  HCT 46.9* 39.2 43.1  MCV 93.6 92.7 89.8  PLT 201 162 334   Cardiac Enzymes: No results for input(s): CKTOTAL, CKMB, CKMBINDEX, TROPONINI in the last 8760 hours. BNP: Invalid input(s): POCBNP Lab Results  Component Value Date   HGBA1C 5.4 06/02/2018   Lab Results  Component Value Date   TSH 1.95 10/29/2018   Lab Results  Component Value Date   VITAMINB12 254 09/04/2015   Lab Results  Component Value Date   FOLATE >23.3 09/04/2015   Lab Results  Component Value Date   IRON 31 (L) 09/04/2015   FERRITIN 7.3 (L) 09/04/2015    Assessment/Plan 1. Annual physical exam - performed today - no longer doing mammograms - Lipid panel  2. Urinary frequency - reported as worse than usual and requesting testing for UTI - Urine Culture - Urinalysis, Routine w reflex microscopic  3. Spastic hemiplegia affecting dominant side ([place]) - remains, had additional therapy after last hospitalization  -manages quite well   4. Aphasia as late effect of stroke -remains biggest barrier for her it appears -does have communication board that was updated   5. Essential hypertension -bp has been very well controlled even low so will d/c losartan at this point -[name] agrees to monitor bp and let me know if it trends up over 140/90  6. Degenerative cervical spinal stenosis -causes some chronic neck and shoulder discomfort  7. Left rotator cuff tear arthropathy -for upcoming injection which may help with this and her chest wall pain which seems related  Labs/tests ordered:   Lab Orders     Urine Culture     Lipid panel     Urinalysis, Routine w reflex microscopic  F/u 6 mos med mgt, no labs  Jarin Cornfield L. Zykeem Bauserman, D.O. [facility] [address], [place] [address] Cell Phone (Mon-Fri 8am-5pm):  [phone] On Call:  [phone] & follow prompts after 5pm & weekends Office Phone:  [phone] Office Fax:  [phone]

## 2019-10-21 NOTE — Patient Instructions (Addendum)
Stop losartan and monitor bp at home a few times a week for the next month.  Let me know if it's running up over 140/90.    Health Maintenance After Age 81 After age 59, you are at a higher risk for certain long-term diseases and infections as well as injuries from falls. Falls are a major cause of broken bones and head injuries in people who are older than age 44. Getting regular preventive care can help to keep you healthy and well. Preventive care includes getting regular testing and making lifestyle changes as recommended by your health care provider. Talk with your health care provider about:  Which screenings and tests you should have. A screening is a test that checks for a disease when you have no symptoms.  A diet and exercise plan that is right for you. What should I know about screenings and tests to prevent falls? Screening and testing are the best ways to find a health problem early. Early diagnosis and treatment give you the best chance of managing medical conditions that are common after age 69. Certain conditions and lifestyle choices may make you more likely to have a fall. Your health care provider may recommend:  Regular vision checks. Poor vision and conditions such as cataracts can make you more likely to have a fall. If you wear glasses, make sure to get your prescription updated if your vision changes.  Medicine review. Work with your health care provider to regularly review all of the medicines you are taking, including over-the-counter medicines. Ask your health care provider about any side effects that may make you more likely to have a fall. Tell your health care provider if any medicines that you take make you feel dizzy or sleepy.  Osteoporosis screening. Osteoporosis is a condition that causes the bones to get weaker. This can make the bones weak and cause them to break more easily.  Blood pressure screening. Blood pressure changes and medicines to control blood  pressure can make you feel dizzy.  Strength and balance checks. Your health care provider may recommend certain tests to check your strength and balance while standing, walking, or changing positions.  Foot health exam. Foot pain and numbness, as well as not wearing proper footwear, can make you more likely to have a fall.  Depression screening. You may be more likely to have a fall if you have a fear of falling, feel emotionally low, or feel unable to do activities that you used to do.  Alcohol use screening. Using too much alcohol can affect your balance and may make you more likely to have a fall. What actions can I take to lower my risk of falls? General instructions  Talk with your health care provider about your risks for falling. Tell your health care provider if: ? You fall. Be sure to tell your health care provider about all falls, even ones that seem minor. ? You feel dizzy, sleepy, or off-balance.  Take over-the-counter and prescription medicines only as told by your health care provider. These include any supplements.  Eat a healthy diet and maintain a healthy weight. A healthy diet includes low-fat dairy products, low-fat (lean) meats, and fiber from whole grains, beans, and lots of fruits and vegetables. Home safety  Remove any tripping hazards, such as rugs, cords, and clutter.  Install safety equipment such as grab bars in bathrooms and safety rails on stairs.  Keep rooms and walkways well-lit. Activity   Follow a regular exercise program to  stay fit. This will help you maintain your balance. Ask your health care provider what types of exercise are appropriate for you.  If you need a cane or walker, use it as recommended by your health care provider.  Wear supportive shoes that have nonskid soles. Lifestyle  Do not drink alcohol if your health care provider tells you not to drink.  If you drink alcohol, limit how much you have: ? 0-1 drink a day for  women. ? 0-2 drinks a day for men.  Be aware of how much alcohol is in your drink. In the [place] one drink equals one typical bottle of beer (12 oz), one-half glass of wine (5 oz), or one shot of hard liquor (1 oz).  Do not use any products that contain nicotine or tobacco, such as cigarettes and e-cigarettes. If you need help quitting, ask your health care provider. Summary  Having a healthy lifestyle and getting preventive care can help to protect your health and wellness after age 48.  Screening and testing are the best way to find a health problem early and help you avoid having a fall. Early diagnosis and treatment give you the best chance for managing medical conditions that are more common for people who are older than age 61.  Falls are a major cause of broken bones and head injuries in people who are older than age 80. Take precautions to prevent a fall at home.  Work with your health care provider to learn what changes you can make to improve your health and wellness and to prevent falls. This information is not intended to replace advice given to you by your health care provider. Make sure you discuss any questions you have with your health care provider. Document Revised: 07/30/2018 Document Reviewed: 02/19/2017 Elsevier Patient Education  2020 [employer].

## 2019-10-22 ENCOUNTER — Encounter: Payer: Self-pay | Admitting: Podiatry

## 2019-10-22 ENCOUNTER — Ambulatory Visit: Payer: Medicare PPO | Admitting: Podiatry

## 2019-10-22 DIAGNOSIS — S90852A Superficial foreign body, left foot, initial encounter: Secondary | ICD-10-CM

## 2019-10-22 NOTE — Progress Notes (Signed)
She presents today with a chief concern of a possible foreign body with a small darkened callused area x1 week to plantar aspect of the forefoot left.  She denies fever chills nausea vomiting muscle aches and pains.  Objective: She is remanded to a wheelchair wheelchair and does not ambulate a lot.  Pulses are palpable left foot.  Evaluation plantar aspect of the forefoot left does demonstrate what appears to be a very small splinter within the skin.  I debrided the area today very carefully removing a piece of wire from her foot.  There was no bleeding no iatrogenic lesions noted.  Assessment: Foreign body piece of wire forefoot left.  Plan: Removal of the piece of wire and dressing with Betadine and Band-Aid.  Instructed caregiver to cover daily watch for signs symptoms of infection.

## 2019-10-22 NOTE — Progress Notes (Signed)
Bad cholesterol is elevated above goal of less than 70 given her prior stroke.  If she can tolerate an increase in her lipitor to 40mg , I would do this (since we are still treating her hyperlipidemia at this time based on stroke guidelines). Sent in [place] to [place].

## 2019-10-23 LAB — MICROSCOPIC MESSAGE

## 2019-10-23 LAB — URINALYSIS, ROUTINE W REFLEX MICROSCOPIC
Bacteria, UA: NONE SEEN /HPF
Bilirubin Urine: NEGATIVE
Glucose, UA: NEGATIVE
Hyaline Cast: NONE SEEN /LPF
Ketones, ur: NEGATIVE
Leukocytes,Ua: NEGATIVE
Nitrite: NEGATIVE
Protein, ur: NEGATIVE
Specific Gravity, Urine: 1.019 (ref 1.001–1.03)
pH: 6 (ref 5.0–8.0)

## 2019-10-23 LAB — URINE CULTURE
MICRO NUMBER:: 10662265
Result:: NO GROWTH
SPECIMEN QUALITY:: ADEQUATE

## 2019-10-23 LAB — LIPID PANEL
Cholesterol: 160 mg/dL (ref <200)
HDL: 54 mg/dL (ref >=50)
LDL Cholesterol (Calc): 87 mg/dL (calc)
Non-HDL Cholesterol (Calc): 106 mg/dL (calc) (ref <130)
Total CHOL/HDL Ratio: 3 (calc) (ref <5.0)
Triglycerides: 99 mg/dL (ref <150)

## 2019-10-24 NOTE — Progress Notes (Signed)
Urine culture did not grow any bacteria.  No UTI.

## 2019-10-27 ENCOUNTER — Encounter: Payer: Self-pay | Admitting: Internal Medicine

## 2019-10-27 ENCOUNTER — Other Ambulatory Visit: Payer: Self-pay

## 2019-10-27 MED ORDER — ATORVASTATIN CALCIUM 40 MG PO TABS: 40.0000 mg | ORAL_TABLET | Freq: Every day | ORAL | 1 refills | Status: DC

## 2019-11-05 ENCOUNTER — Ambulatory Visit: Payer: Medicare PPO | Admitting: Physical Medicine and Rehabilitation

## 2019-11-26 ENCOUNTER — Other Ambulatory Visit: Payer: Self-pay

## 2019-11-26 ENCOUNTER — Encounter: Payer: Medicare PPO | Attending: Physical Medicine and Rehabilitation | Admitting: Physical Medicine and Rehabilitation

## 2019-11-26 ENCOUNTER — Encounter: Payer: Self-pay | Admitting: Physical Medicine and Rehabilitation

## 2019-11-26 VITALS — BP 123/81 | HR 68 | Temp 98.1°F | Ht 66.0 in | Wt 161.0 lb

## 2019-11-26 DIAGNOSIS — M7522 Bicipital tendinitis, left shoulder: Secondary | ICD-10-CM | POA: Diagnosis not present

## 2019-11-26 DIAGNOSIS — I69320 Aphasia following cerebral infarction: Secondary | ICD-10-CM | POA: Diagnosis not present

## 2019-11-26 DIAGNOSIS — M79672 Pain in left foot: Secondary | ICD-10-CM | POA: Insufficient documentation

## 2019-11-26 DIAGNOSIS — S46002D Unspecified injury of muscle(s) and tendon(s) of the rotator cuff of left shoulder, subsequent encounter: Secondary | ICD-10-CM | POA: Diagnosis not present

## 2019-11-26 DIAGNOSIS — S46002S Unspecified injury of muscle(s) and tendon(s) of the rotator cuff of left shoulder, sequela: Secondary | ICD-10-CM | POA: Insufficient documentation

## 2019-11-26 DIAGNOSIS — M1711 Unilateral primary osteoarthritis, right knee: Secondary | ICD-10-CM | POA: Insufficient documentation

## 2019-11-26 DIAGNOSIS — G8110 Spastic hemiplegia affecting unspecified side: Secondary | ICD-10-CM | POA: Diagnosis not present

## 2019-11-26 DIAGNOSIS — M792 Neuralgia and neuritis, unspecified: Secondary | ICD-10-CM | POA: Insufficient documentation

## 2019-11-28 ENCOUNTER — Encounter: Payer: Self-pay | Admitting: Physical Medicine and Rehabilitation

## 2019-11-28 NOTE — Progress Notes (Signed)
Shoulder injection, left  Indication: Left shoulder pain not relieved by medication management and other conservative care.  Informed consent was obtained after describing risks and benefits of the procedure with the patient, this includes bleeding, bruising, infection and medication side effects. The patient wishes to proceed and has given written consent. Patient was placed in a seated position. The left shoulder was marked and prepped with betadine in the subacromial area. A 25-gauge 1-1/2 inch needle was inserted into the subacromial area. After negative draw back for blood, a solution containing 1 mL of celestone and 4 mL of 1% lidocaine was injected. A band aid was applied. The patient tolerated the procedure well. Post procedure instructions were given.

## 2019-11-30 ENCOUNTER — Encounter: Payer: Self-pay | Admitting: Podiatry

## 2019-11-30 ENCOUNTER — Ambulatory Visit: Payer: Medicare PPO | Admitting: Podiatry

## 2019-11-30 ENCOUNTER — Other Ambulatory Visit: Payer: Self-pay

## 2019-11-30 VITALS — Temp 98.0°F

## 2019-11-30 DIAGNOSIS — B351 Tinea unguium: Secondary | ICD-10-CM | POA: Diagnosis not present

## 2019-11-30 DIAGNOSIS — M79674 Pain in right toe(s): Secondary | ICD-10-CM

## 2019-11-30 DIAGNOSIS — G5791 Unspecified mononeuropathy of right lower limb: Secondary | ICD-10-CM

## 2019-11-30 DIAGNOSIS — M79675 Pain in left toe(s): Secondary | ICD-10-CM | POA: Diagnosis not present

## 2019-11-30 DIAGNOSIS — L84 Corns and callosities: Secondary | ICD-10-CM

## 2019-12-01 ENCOUNTER — Other Ambulatory Visit: Payer: Self-pay | Admitting: *Deleted

## 2019-12-01 DIAGNOSIS — R399 Unspecified symptoms and signs involving the genitourinary system: Secondary | ICD-10-CM | POA: Diagnosis not present

## 2019-12-01 MED ORDER — GABAPENTIN 300 MG PO CAPS: ORAL_CAPSULE | ORAL | 1 refills | Status: DC

## 2019-12-01 NOTE — Progress Notes (Signed)
Subjective: Kristin [name] is a 79 y.o. female patient seen today at [facility] with history of peripheral neuropathy and corn(s) of both feet and painful mycotic toenails b/l that are difficult to trim. Pain interferes with ambulation. Aggravating factors include wearing enclosed shoe gear. Pain is relieved with periodic professional debridement.   Caregiver states she was seen by Dr. Milinda Pointer last month for removal of foreign body of the left foot.  She states foot feels better.   Patient Active Problem List   Diagnosis Date Noted  . Fever 07/07/2019  . Acute costochondritis 07/07/2019  . Acute UTI 07/07/2019  . Nephrolithiasis 02/24/2017  . Acute lower UTI 02/24/2017  . Colitis 02/24/2017  . Hypokalemia 02/24/2017  . AKI (acute kidney injury) ([place]) 02/24/2017  . Acute kidney injury ([place]) 02/24/2017  . Acute cystitis with hematuria   . History of stroke with residual deficit 02/04/2017  . Female stress incontinence 01/09/2017  . Ureteral calculus, right 01/09/2017  . Nocturia 12/26/2016  . Impacted cerumen of left ear 11/07/2016  . Left ear pain 11/07/2016  . Urinary urgency 08/20/2016  . Chest pain syndrome   . Chest pain 10/17/2015  . IDA (iron deficiency anemia) 10/04/2015  . Right leg swelling 08/14/2015  . Right rotator cuff tendinitis 08/14/2015  . Biceps tendonitis on left 03/13/2015  . Injury of left rotator cuff 08/17/2014  . Shoulder subluxation, right 07/06/2014  . S/P CABG x 2 12/17/2013  . Essential hypertension   . Unstable angina ([place]) 12/09/2013  . Atherosclerotic heart disease of native coronary artery with unstable angina pectoris: H/o PCI to RCA; Ostial LAD &dRCA severe stenosis. 12/09/2013  . Dyslipidemia, goal LDL below 70 12/09/2013  . Prolonged QT interval 12/09/2013  . Open wound of scalp, without mention of complication 09/32/3557  . CVA 2001 06/01/2012  . Unilateral primary osteoarthritis, right knee 06/01/2012  . Lumbago 06/01/2012  . Spastic  hemiplegia affecting dominant side ([place]) 06/01/2012  . Aphasia as late effect of stroke 06/01/2012    Current Outpatient Medications on File Prior to Visit  Medication Sig Dispense Refill  . aspirin 81 MG tablet Take 81 mg by mouth daily.    Marland Kitchen atorvastatin (LIPITOR) 40 MG tablet Take 1 tablet (40 mg total) by mouth daily. 90 tablet 1  . clopidogrel (PLAVIX) 75 MG tablet TAKE 1 TABLET EACH DAY. 90 tablet 1  . Coenzyme Q10 (COQ-10 PO) Take 1 tablet daily by mouth.    . desmopressin (DDAVP) 0.2 MG tablet Take 0.2 mg by mouth at bedtime.     . diclofenac sodium (VOLTAREN) 1 % GEL Apply 4 g topically 4 (four) times daily as needed (pain).     . DULoxetine (CYMBALTA) 20 MG capsule Take 1 capsule (20 mg total) by mouth daily. 90 capsule 1  . gabapentin (NEURONTIN) 300 MG capsule TAKE (1) CAPSULE THREE TIMES DAILY. 90 capsule 3  . mirabegron ER (MYRBETRIQ) 50 MG TB24 tablet Take 50 mg by mouth daily.     . nitroGLYCERIN (NITROSTAT) 0.4 MG SL tablet Place 1 tablet (0.4 mg total) under the tongue every 5 (five) minutes as needed for chest pain. 30 tablet 0  . omeprazole (PRILOSEC) 20 MG capsule TAKE 1 CAPSULE EVERY DAY. 90 capsule 1  . trimethoprim (TRIMPEX) 100 MG tablet Take 1 tablet by mouth daily.    . TURMERIC PO Take 1 tablet by mouth daily.      No current facility-administered medications on file prior to visit.    No  Known Allergies  Objective: Physical Exam  General: Kristin [name] is a pleasant 79 y.o.  Caucasian female, in NAD. AAO x 3.   Vascular:  Neurovascular status unchanged b/l. Capillary refill time to digits immediate b/l. Palpable DP pulses b/l. Palpable PT pulses b/l. Pedal hair absent b/l Skin temperature gradient within normal limits b/l. No edema noted b/l.  Dermatological:  Pedal skin with normal turgor, texture and tone bilaterally. No open wounds bilaterally. No interdigital macerations bilaterally. Toenails 1-5 b/l elongated, dystrophic, thickened, crumbly with  subungual debris and tenderness to dorsal palpation. Hyperkeratotic lesion(s) R 3rd toe.  No erythema, no edema, no drainage, no flocculence.  Musculoskeletal:  Normal muscle strength 5/5 to all lower extremity muscle groups bilaterally. No gross bony deformities bilaterally. No pain crepitus or joint limitation noted with ROM b/l. Clawtoes 1-5 right foot. Wearing AFO for right LE.  Neurological:  Protective sensation intact with 10 gram monofilament left lower extremity. Protective sensation decreased with 10 gram monofilament right lower extremity.  Assessment and Plan:  1. Pain due to onychomycosis of toenails of both feet   2. Corns   3. Neuropathy of right lower extremity    -Examined patient. -No new findings. No new orders. -Toenails 1-5 b/l were debrided in length and girth with sterile nail nippers and dremel without iatrogenic bleeding.  -Corn(s) R 3rd toe pared utilizing sterile scalpel blade without complication or incident. Total number debrided=1. -Patient to continue soft, supportive shoe gear daily. -Patient to report any pedal injuries to medical professional immediately. -Patient/POA to call should there be question/concern in the interim.  Return in about 3 months (around 03/01/2020).  Marzetta Board, DPM

## 2019-12-21 DIAGNOSIS — N390 Urinary tract infection, site not specified: Secondary | ICD-10-CM | POA: Diagnosis not present

## 2019-12-21 DIAGNOSIS — N3281 Overactive bladder: Secondary | ICD-10-CM | POA: Diagnosis not present

## 2019-12-21 DIAGNOSIS — N3946 Mixed incontinence: Secondary | ICD-10-CM | POA: Diagnosis not present

## 2019-12-21 DIAGNOSIS — N393 Stress incontinence (female) (male): Secondary | ICD-10-CM | POA: Diagnosis not present

## 2019-12-28 ENCOUNTER — Other Ambulatory Visit: Payer: Self-pay

## 2019-12-28 ENCOUNTER — Encounter: Payer: Self-pay | Admitting: Physical Medicine and Rehabilitation

## 2019-12-28 ENCOUNTER — Encounter: Payer: Medicare PPO | Attending: Physical Medicine and Rehabilitation | Admitting: Physical Medicine and Rehabilitation

## 2019-12-28 VITALS — BP 151/80 | HR 78 | Temp 98.6°F

## 2019-12-28 DIAGNOSIS — G4700 Insomnia, unspecified: Secondary | ICD-10-CM

## 2019-12-28 DIAGNOSIS — M7918 Myalgia, other site: Secondary | ICD-10-CM

## 2019-12-28 DIAGNOSIS — G8110 Spastic hemiplegia affecting unspecified side: Secondary | ICD-10-CM | POA: Insufficient documentation

## 2019-12-28 DIAGNOSIS — S46002D Unspecified injury of muscle(s) and tendon(s) of the rotator cuff of left shoulder, subsequent encounter: Secondary | ICD-10-CM | POA: Diagnosis not present

## 2019-12-28 DIAGNOSIS — M7522 Bicipital tendinitis, left shoulder: Secondary | ICD-10-CM | POA: Diagnosis not present

## 2019-12-28 DIAGNOSIS — F419 Anxiety disorder, unspecified: Secondary | ICD-10-CM | POA: Diagnosis not present

## 2019-12-28 DIAGNOSIS — M7520 Bicipital tendinitis, unspecified shoulder: Secondary | ICD-10-CM | POA: Diagnosis not present

## 2019-12-28 DIAGNOSIS — S46002S Unspecified injury of muscle(s) and tendon(s) of the rotator cuff of left shoulder, sequela: Secondary | ICD-10-CM | POA: Insufficient documentation

## 2019-12-28 DIAGNOSIS — G802 Spastic hemiplegic cerebral palsy: Secondary | ICD-10-CM | POA: Diagnosis not present

## 2019-12-28 DIAGNOSIS — I69320 Aphasia following cerebral infarction: Secondary | ICD-10-CM | POA: Diagnosis not present

## 2019-12-28 DIAGNOSIS — M792 Neuralgia and neuritis, unspecified: Secondary | ICD-10-CM | POA: Diagnosis not present

## 2019-12-28 DIAGNOSIS — M1711 Unilateral primary osteoarthritis, right knee: Secondary | ICD-10-CM | POA: Diagnosis not present

## 2019-12-28 DIAGNOSIS — M79672 Pain in left foot: Secondary | ICD-10-CM | POA: Diagnosis not present

## 2019-12-28 DIAGNOSIS — K5900 Constipation, unspecified: Secondary | ICD-10-CM

## 2019-12-28 NOTE — Progress Notes (Signed)
Subjective:    Patient ID: Kristin [name], female    DOB: 03/23/1941, 79 y.o.   MRN: [id number]  HPI I am meeting with the patient today for follow-up of left MCA infarct and associated deficits. Her son-in-law is present today and is not as aware of her daily activity at her facility. Patient is unable to state whether she is receiving occupational therapy for her left shoulder so I have provided her with a script for this.   Her left shoulder pain and range of motion are much improved from corticosteroid injection 1 month ago. Discussed that the next earliest one she can received would be in 2 months. Will schedule her for a regular follow-up now and if pain returns asked son-in-law know that he can let daughter know to call or my-chart message me 2 weeks prior to appointment to get auth for repeat corticosteroid injection.   Her pain in her right arm and leg is much improved since increasing her Gabapentin to 300mg  TID. She continues on this dose. She does not need a refill today.  The patient demonstrated some of the stretches she is doing on her own. This does not make her drowsy.   She has been having some left foot pain in the pad of her foot. Notes swelling as well. She does follow with a podiatrist.   Mood has been good. She is not eating as well as usual since she has less communal dining events at her center. She is not involved in any physical therapy but tries to stretch out her hand herself. She is able to ambulate within her room and use the rest room. She has a lot of joy in her life in living in the center. She loves listening to music.   Mrs. Brawley has continued outpatient follow-up with urology and continues on DDAVP and Myrbetriq for urinary incontinence. Her sleep continues to be disturbed as a result of this.   Cervical myofascial pain: She does point to some cervical myofascial pain today with palpable trigger points. We can also consider trigger point injections in the future.  For now, continue to apply compounded cream up to 4 times per day.   Pain Inventory Average Pain 8 Pain Right Now 8 My pain is intermittent, stabbing and aching  In the last 24 hours, has pain interfered with the following? General activity 3 Relation with others 0 Enjoyment of life 1 What TIME of day is your pain at its worst? morning, daytime, evening, night Sleep (in general) Fair  Pain is worse with: unsure Pain improves with: therapy/exercise and medication Relief from Meds: 7  Mobility walk with assistance use a walker how many minutes can you walk? 10 mins ability to climb steps?  no do you drive?  no use a wheelchair Do you have any goals in this area?  yes  Function retired I need assistance with the following:  meal prep, household duties and shopping Do you have any goals in this area?  yes  Neuro/Psych bladder control problems weakness trouble walking depression  Prior Studies Any changes since last visit?  yes Chest xray in March.  Physicians involved in your care Any changes since last visit?  no   Family History  Problem Relation Age of Onset  . Diabetes Mother   . Heart disease Mother   . Heart attack Father   . Breast cancer Sister    Social History   Socioeconomic History  . Marital status: Married  Spouse name: Not on file  . Number of children: 2  . Years of education: Not on file  . Highest education level: Not on file  Occupational History  . Occupation: retired Education officer, museum  Tobacco Use  . Smoking status: Never Smoker  . Smokeless tobacco: Never Used  Vaping Use  . Vaping Use: Never used  Substance and Sexual Activity  . Alcohol use: Yes    Alcohol/week: 0.0 standard drinks    Comment: glass of wine a day  . Drug use: No  . Sexual activity: Never  Other Topics Concern  . Not on file  Social History Narrative  . Not on file   Social Determinants of Health   Financial Resource Strain:   . Difficulty of Paying  Living Expenses: Not on file  Food Insecurity:   . Worried About Charity fundraiser in the Last Year: Not on file  . Ran Out of Food in the Last Year: Not on file  Transportation Needs:   . Lack of Transportation (Medical): Not on file  . Lack of Transportation (Non-Medical): Not on file  Physical Activity:   . Days of Exercise per Week: Not on file  . Minutes of Exercise per Session: Not on file  Stress:   . Feeling of Stress : Not on file  Social Connections:   . Frequency of Communication with Friends and Family: Not on file  . Frequency of Social Gatherings with Friends and Family: Not on file  . Attends Religious Services: Not on file  . Active Member of Clubs or Organizations: Not on file  . Attends Archivist Meetings: Not on file  . Marital Status: Not on file   Past Surgical History:  Procedure Laterality Date  . ABDOMINAL HYSTERECTOMY    . CAROTID STENT    . cataracts    . COLONOSCOPY N/A 10/05/2015   Procedure: COLONOSCOPY;  Surgeon: Manus Gunning, MD;  Location: [facility];  Service: Gastroenterology;  Laterality: N/A;  . CORONARY ARTERY BYPASS GRAFT N/A 12/17/2013   Procedure: CORONARY ARTERY BYPASS GRAFTING (CABG);  Surgeon: Gaye Pollack, MD;  Location: [facility];  Service: Open Heart Surgery;  Laterality: N/A;  Times 2 using left internal mammary artery and endoscopically harvested right saphenous vein  . ESOPHAGOGASTRODUODENOSCOPY    . ESOPHAGOGASTRODUODENOSCOPY N/A 10/05/2015   Procedure: ESOPHAGOGASTRODUODENOSCOPY (EGD);  Surgeon: Manus Gunning, MD;  Location: [facility];  Service: Gastroenterology;  Laterality: N/A;  . INTRAOPERATIVE TRANSESOPHAGEAL ECHOCARDIOGRAM N/A 12/17/2013   Procedure: INTRAOPERATIVE TRANSESOPHAGEAL ECHOCARDIOGRAM;  Surgeon: Gaye Pollack, MD;  Location: [facility];  Service: Open Heart Surgery;  Laterality: N/A;  . lasix    . LEFT HEART CATHETERIZATION WITH CORONARY ANGIOGRAM N/A 12/13/2013   Procedure: LEFT HEART  CATHETERIZATION WITH CORONARY ANGIOGRAM;  Surgeon: Jettie Booze, MD;  Location: [facility];  Service: Cardiovascular;  Laterality: N/A;  . retina injection     Past Medical History:  Diagnosis Date  . Arthritis    hands  . CAD (coronary artery disease)    a. 2010: stents pRCA and dRCA. b. LHC 12/13/13: Minimal distal LM dz. LAD 95% ostial w/ L-->R collat. LCx mild dz. RCA 95% mid-dRCA. Patent stents proximal and distal RCA. EF not assessed, Nl by nuclear study.     . Central retinal vein occlusion, right eye   . Chronic kidney disease    hx kidney stone  . Depression   . Diplopia   . Ear pain  right  . Eosinophilic esophagitis   . Essential hypertension   . GERD (gastroesophageal reflux disease)   . H/O: whooping cough   . Hyperlipidemia with target LDL less than 70   . Incontinence    urine  . Stroke ([place]) 2001   reesultant Rt spastic hemplegia and aphasia    BP (!) 151/80   Pulse 78   Temp 98.6 F (37 C)   SpO2 94%   Opioid Risk Score:   Fall Risk Score:  `1  Depression screen PHQ 2/9  Depression screen [facility] 2/9 10/21/2019 10/19/2019 07/20/2019 06/03/2019 04/08/2019 08/26/2017 11/11/2014  Decreased Interest 0 - 0 0 0 0 0  Down, Depressed, Hopeless 0 2 0 0 0 0 0  PHQ - 2 Score 0 2 0 0 0 0 0  Altered sleeping - 0 - - - - -  Tired, decreased energy - 1 - - - - -  Change in appetite - 0 - - - - -  Feeling bad or failure about yourself  - 0 - - - - -  Trouble concentrating - 1 - - - - -  Moving slowly or fidgety/restless - 1 - - - - -  Suicidal thoughts - 0 - - - - -  PHQ-9 Score - 5 - - - - -   Review of Systems  Constitutional: Negative.   HENT: Negative.   Eyes: Negative.   Respiratory: Negative.   Cardiovascular: Negative.   Endocrine: Negative.   Genitourinary: Positive for urgency.  Musculoskeletal: Positive for gait problem.       LEFT SHOULDER PAIN LEFT FOOT PAIN  Skin:       TENDER SPOT BOTTOM OF LEFT FOOT  Allergic/Immunologic: Negative.     Neurological: Positive for weakness.  Hematological: Negative.   Psychiatric/Behavioral:       DEPRESSION  ANXIETY       Objective:   Physical Exam Gen: no distress, normal appearing HEENT: oral mucosa pink and moist, NCAT Cardio: Reg rate Chest: normal effort, normal rate of breathing Abd: soft, non-distended Ext: no edema Skin: left foot pad with swelling and appears to have splinter with associated palpable mass in middle of foot pad.  Neuro:Patient communicates through short phrases and words as well as head nods. Primarily expressive aphasia; she understands very well and follows commands well. Right upper extremity with minimal voluntary movement. She has 2+ to 3 out of 5 hip flexor and knee extensor strength right lower extremity. Right ankle in splint with minimal active movement. Left upper extremity and left lower extremity grossly 4+ to 5 out of 5. No tenderness over left subacromial joint. She does have palpable left cervical myofascial trigger points.  Musculoskeletal:ongoing sublux.Minimal pain with right knee range of motion today, passive or active..Right hamstrings tight. Psych:Pt's affect is pleasant    Assessment & Plan:  1. Hx of left MCA infarct with spastic right hemiparesis, expressive greater than receptive aphasia  2. .Primary osteoarthritis right knee 4. Insomnia, anxiety  5. Constipation 6. Right shoulder pain related to chronic subluxation, mild adhesive capsulitis, chronic spasticity  7. OSA.  8. Left rotator cuff tendonitis/bursitis;biciptial tendonitis--generally improved. 9. Right rotator cuff tendonitis/right shoulder sublux 10.Right sided neuropathic pain 11. NON-healing left medial ankle wound.   Plan:  1.Can continue daily ambulation and exercise at facility. Focus should be on mechanics and strengthening of the lower extremities, particularly the right side. Also needs to improve hamstring flexibility. Prescribed  occupational therapy to maximize range of motion of  left shoulder.  2.Continue Gabapentin dose 300mg  TID for pain control. Does not need refill today.   3. Can stop right shoulder harness which is providing minimal benefit.  4. Corticosteroid injection appears to have improved pain as well as range of motion. She does have palpable trigger points in her cervical muscles. Discussed applying the compounded cream there up to 4 times per day. Trigger point injections can be considered if this does not help. 5. Ice to left foot swelling and follow-up with podiatry as may need splinter removal.   All questions answered. RTC in 2 months.

## 2019-12-30 ENCOUNTER — Other Ambulatory Visit: Payer: Self-pay | Admitting: Internal Medicine

## 2019-12-31 NOTE — Telephone Encounter (Signed)
rx sent to pharmacy by e-script  

## 2020-01-03 DIAGNOSIS — M25512 Pain in left shoulder: Secondary | ICD-10-CM | POA: Diagnosis not present

## 2020-01-03 DIAGNOSIS — M6281 Muscle weakness (generalized): Secondary | ICD-10-CM | POA: Diagnosis not present

## 2020-01-07 DIAGNOSIS — M25512 Pain in left shoulder: Secondary | ICD-10-CM | POA: Diagnosis not present

## 2020-01-07 DIAGNOSIS — M6281 Muscle weakness (generalized): Secondary | ICD-10-CM | POA: Diagnosis not present

## 2020-01-10 DIAGNOSIS — M6281 Muscle weakness (generalized): Secondary | ICD-10-CM | POA: Diagnosis not present

## 2020-01-10 DIAGNOSIS — M25512 Pain in left shoulder: Secondary | ICD-10-CM | POA: Diagnosis not present

## 2020-01-13 DIAGNOSIS — M25512 Pain in left shoulder: Secondary | ICD-10-CM | POA: Diagnosis not present

## 2020-01-13 DIAGNOSIS — M6281 Muscle weakness (generalized): Secondary | ICD-10-CM | POA: Diagnosis not present

## 2020-01-17 DIAGNOSIS — M6281 Muscle weakness (generalized): Secondary | ICD-10-CM | POA: Diagnosis not present

## 2020-01-17 DIAGNOSIS — M25512 Pain in left shoulder: Secondary | ICD-10-CM | POA: Diagnosis not present

## 2020-01-21 DIAGNOSIS — M25512 Pain in left shoulder: Secondary | ICD-10-CM | POA: Diagnosis not present

## 2020-01-21 DIAGNOSIS — M6281 Muscle weakness (generalized): Secondary | ICD-10-CM | POA: Diagnosis not present

## 2020-01-25 DIAGNOSIS — M25512 Pain in left shoulder: Secondary | ICD-10-CM | POA: Diagnosis not present

## 2020-01-25 DIAGNOSIS — M6281 Muscle weakness (generalized): Secondary | ICD-10-CM | POA: Diagnosis not present

## 2020-01-27 DIAGNOSIS — M25512 Pain in left shoulder: Secondary | ICD-10-CM | POA: Diagnosis not present

## 2020-01-27 DIAGNOSIS — M6281 Muscle weakness (generalized): Secondary | ICD-10-CM | POA: Diagnosis not present

## 2020-01-31 DIAGNOSIS — M25512 Pain in left shoulder: Secondary | ICD-10-CM | POA: Diagnosis not present

## 2020-01-31 DIAGNOSIS — M6281 Muscle weakness (generalized): Secondary | ICD-10-CM | POA: Diagnosis not present

## 2020-02-04 DIAGNOSIS — M25512 Pain in left shoulder: Secondary | ICD-10-CM | POA: Diagnosis not present

## 2020-02-04 DIAGNOSIS — M6281 Muscle weakness (generalized): Secondary | ICD-10-CM | POA: Diagnosis not present

## 2020-02-07 DIAGNOSIS — M25512 Pain in left shoulder: Secondary | ICD-10-CM | POA: Diagnosis not present

## 2020-02-07 DIAGNOSIS — M6281 Muscle weakness (generalized): Secondary | ICD-10-CM | POA: Diagnosis not present

## 2020-02-11 DIAGNOSIS — M6281 Muscle weakness (generalized): Secondary | ICD-10-CM | POA: Diagnosis not present

## 2020-02-11 DIAGNOSIS — M25512 Pain in left shoulder: Secondary | ICD-10-CM | POA: Diagnosis not present

## 2020-02-14 DIAGNOSIS — M25512 Pain in left shoulder: Secondary | ICD-10-CM | POA: Diagnosis not present

## 2020-02-14 DIAGNOSIS — M6281 Muscle weakness (generalized): Secondary | ICD-10-CM | POA: Diagnosis not present

## 2020-02-17 DIAGNOSIS — L304 Erythema intertrigo: Secondary | ICD-10-CM | POA: Diagnosis not present

## 2020-02-17 DIAGNOSIS — L820 Inflamed seborrheic keratosis: Secondary | ICD-10-CM | POA: Diagnosis not present

## 2020-02-17 DIAGNOSIS — Z08 Encounter for follow-up examination after completed treatment for malignant neoplasm: Secondary | ICD-10-CM | POA: Diagnosis not present

## 2020-02-17 DIAGNOSIS — L910 Hypertrophic scar: Secondary | ICD-10-CM | POA: Diagnosis not present

## 2020-02-17 DIAGNOSIS — Z85828 Personal history of other malignant neoplasm of skin: Secondary | ICD-10-CM | POA: Diagnosis not present

## 2020-02-18 DIAGNOSIS — M25512 Pain in left shoulder: Secondary | ICD-10-CM | POA: Diagnosis not present

## 2020-02-18 DIAGNOSIS — M6281 Muscle weakness (generalized): Secondary | ICD-10-CM | POA: Diagnosis not present

## 2020-02-21 DIAGNOSIS — I69351 Hemiplegia and hemiparesis following cerebral infarction affecting right dominant side: Secondary | ICD-10-CM | POA: Diagnosis not present

## 2020-02-21 DIAGNOSIS — M6281 Muscle weakness (generalized): Secondary | ICD-10-CM | POA: Diagnosis not present

## 2020-02-21 DIAGNOSIS — M25512 Pain in left shoulder: Secondary | ICD-10-CM | POA: Diagnosis not present

## 2020-02-24 DIAGNOSIS — M25512 Pain in left shoulder: Secondary | ICD-10-CM | POA: Diagnosis not present

## 2020-02-24 DIAGNOSIS — M6281 Muscle weakness (generalized): Secondary | ICD-10-CM | POA: Diagnosis not present

## 2020-02-24 DIAGNOSIS — I69351 Hemiplegia and hemiparesis following cerebral infarction affecting right dominant side: Secondary | ICD-10-CM | POA: Diagnosis not present

## 2020-02-29 ENCOUNTER — Ambulatory Visit: Payer: Medicare PPO | Admitting: Podiatry

## 2020-02-29 DIAGNOSIS — R3581 Nocturnal polyuria: Secondary | ICD-10-CM | POA: Diagnosis not present

## 2020-02-29 DIAGNOSIS — I69351 Hemiplegia and hemiparesis following cerebral infarction affecting right dominant side: Secondary | ICD-10-CM | POA: Diagnosis not present

## 2020-02-29 DIAGNOSIS — M6281 Muscle weakness (generalized): Secondary | ICD-10-CM | POA: Diagnosis not present

## 2020-02-29 DIAGNOSIS — N3281 Overactive bladder: Secondary | ICD-10-CM | POA: Diagnosis not present

## 2020-02-29 DIAGNOSIS — R3915 Urgency of urination: Secondary | ICD-10-CM | POA: Diagnosis not present

## 2020-02-29 DIAGNOSIS — N390 Urinary tract infection, site not specified: Secondary | ICD-10-CM | POA: Diagnosis not present

## 2020-02-29 DIAGNOSIS — N201 Calculus of ureter: Secondary | ICD-10-CM | POA: Diagnosis not present

## 2020-02-29 DIAGNOSIS — N393 Stress incontinence (female) (male): Secondary | ICD-10-CM | POA: Diagnosis not present

## 2020-02-29 DIAGNOSIS — M25512 Pain in left shoulder: Secondary | ICD-10-CM | POA: Diagnosis not present

## 2020-03-01 NOTE — Progress Notes (Signed)
Subjective:    Patient ID: Kristin [name], female    DOB: 04/11/1941, 79 y.o.   MRN: [id number]  HPI  Since last visit, she has been having more and more difficulty getting in and out of the bed. More difficulty flexing the right hip. Started sleeping in the recliner. She would be interested in starting PT.  Overall she is doing much better  Shoulder pain improved with OT and injection.  I am meeting with the patient today for follow-up of left MCA infarct and associated deficits. [name] and is not as aware of her daily activity at her facility.   Her left shoulder pain and range of motion are much improved from corticosteroid injection. No need to repeat injection at this time.   Her pain in her right arm and leg is much improved since increasing her Gabapentin to 300mg  TID. She continues on this dose. She does not need a refill today.  The patient demonstrated some of the stretches she is doing on her own. This does not make her drowsy.   She is falling asleep well at night but nocturia wakes her 3-4 times.   Urology has gone up up on DDAVP and this has helped  Podiatry removed foreign body in foot and pain has resolved.   Mood has been good. She is not eating as well as usual since she has less communal dining events at her center. She is not involved in any physical therapy but tries to stretch out her hand herself. She is able to ambulate within her room and use the rest room. She has a lot of joy in her life in living in the center. She loves listening to music.   Kristin [name] has continued outpatient follow-up with urology and continues on DDAVP and Myrbetriq for urinary incontinence. Her sleep continues to be disturbed as a result of this.   Cervical myofascial pain: For now, continue to apply compounded cream up to 4 times per day. Has improved with OT  Pain Inventory Average Pain 5 Pain Right Now 5 My pain is intermittent, sharp, dull, tingling and  aching  In the last 24 hours, has pain interfered with the following? General activity 3 Relation with others 3 Enjoyment of life 5 What TIME of day is your pain at its worst? morning, daytime, evening, night Sleep (in general) Fair  Pain is worse with: walking, sitting and unsure Pain improves with: therapy/exercise and medication Relief from Meds: 1      Family History  Problem Relation Age of Onset  . Diabetes Mother   . Heart disease Mother   . Heart attack Father   . Breast cancer Sister    Social History   Socioeconomic History  . Marital status: Married    Spouse name: Not on file  . Number of children: 2  . Years of education: Not on file  . Highest education level: Not on file  Occupational History  . Occupation: retired Education officer, museum  Tobacco Use  . Smoking status: Never Smoker  . Smokeless tobacco: Never Used  Vaping Use  . Vaping Use: Never used  Substance and Sexual Activity  . Alcohol use: Yes    Alcohol/week: 0.0 standard drinks    Comment: glass of wine a day  . Drug use: No  . Sexual activity: Never  Other Topics Concern  . Not on file  Social History Narrative  . Not on file   Social Determinants of Health  Financial Resource Strain:   . Difficulty of Paying Living Expenses: Not on file  Food Insecurity:   . Worried About Charity fundraiser in the Last Year: Not on file  . Ran Out of Food in the Last Year: Not on file  Transportation Needs:   . Lack of Transportation (Medical): Not on file  . Lack of Transportation (Non-Medical): Not on file  Physical Activity:   . Days of Exercise per Week: Not on file  . Minutes of Exercise per Session: Not on file  Stress:   . Feeling of Stress : Not on file  Social Connections:   . Frequency of Communication with Friends and Family: Not on file  . Frequency of Social Gatherings with Friends and Family: Not on file  . Attends Religious Services: Not on file  . Active Member of Clubs or  Organizations: Not on file  . Attends Archivist Meetings: Not on file  . Marital Status: Not on file   Past Surgical History:  Procedure Laterality Date  . ABDOMINAL HYSTERECTOMY    . CAROTID STENT    . cataracts    . COLONOSCOPY N/A 10/05/2015   Procedure: COLONOSCOPY;  Surgeon: Manus Gunning, MD;  Location: [facility];  Service: Gastroenterology;  Laterality: N/A;  . CORONARY ARTERY BYPASS GRAFT N/A 12/17/2013   Procedure: CORONARY ARTERY BYPASS GRAFTING (CABG);  Surgeon: Gaye Pollack, MD;  Location: [place];  Service: Open Heart Surgery;  Laterality: N/A;  Times 2 using left internal mammary artery and endoscopically harvested right saphenous vein  . ESOPHAGOGASTRODUODENOSCOPY    . ESOPHAGOGASTRODUODENOSCOPY N/A 10/05/2015   Procedure: ESOPHAGOGASTRODUODENOSCOPY (EGD);  Surgeon: Manus Gunning, MD;  Location: [facility];  Service: Gastroenterology;  Laterality: N/A;  . INTRAOPERATIVE TRANSESOPHAGEAL ECHOCARDIOGRAM N/A 12/17/2013   Procedure: INTRAOPERATIVE TRANSESOPHAGEAL ECHOCARDIOGRAM;  Surgeon: Gaye Pollack, MD;  Location: [facility];  Service: Open Heart Surgery;  Laterality: N/A;  . lasix    . LEFT HEART CATHETERIZATION WITH CORONARY ANGIOGRAM N/A 12/13/2013   Procedure: LEFT HEART CATHETERIZATION WITH CORONARY ANGIOGRAM;  Surgeon: Jettie Booze, MD;  Location: [facility] CATH LAB;  Service: Cardiovascular;  Laterality: N/A;  . retina injection     Past Medical History:  Diagnosis Date  . Arthritis    hands  . CAD (coronary artery disease)    a. 2010: stents pRCA and dRCA. b. LHC 12/13/13: Minimal distal LM dz. LAD 95% ostial w/ L-->R collat. LCx mild dz. RCA 95% mid-dRCA. Patent stents proximal and distal RCA. EF not assessed, Nl by nuclear study.     . Central retinal vein occlusion, right eye   . Chronic kidney disease    hx kidney stone  . Depression   . Diplopia   . Ear pain    right  . Eosinophilic esophagitis   . Essential hypertension   .  GERD (gastroesophageal reflux disease)   . H/O: whooping cough   . Hyperlipidemia with target LDL less than 70   . Incontinence    urine  . Stroke [facility]) 2001   reesultant Rt spastic hemplegia and aphasia    There were no vitals taken for this visit.  Opioid Risk Score:   Fall Risk Score:  `1  Depression screen PHQ 2/9  Depression screen [facility] 2/9 10/21/2019 10/19/2019 07/20/2019 06/03/2019 04/08/2019 08/26/2017 11/11/2014  Decreased Interest 0 - 0 0 0 0 0  Down, Depressed, Hopeless 0 2 0 0 0 0 0  PHQ - 2 Score 0 2  0 0 0 0 0  Altered sleeping - 0 - - - - -  Tired, decreased energy - 1 - - - - -  Change in appetite - 0 - - - - -  Feeling bad or failure about yourself  - 0 - - - - -  Trouble concentrating - 1 - - - - -  Moving slowly or fidgety/restless - 1 - - - - -  Suicidal thoughts - 0 - - - - -  PHQ-9 Score - 5 - - - - -   Review of Systems  Constitutional: Negative.   HENT: Negative.   Eyes: Negative.   Respiratory: Negative.   Cardiovascular: Negative.   Endocrine: Negative.   Genitourinary: Positive for urgency.  Musculoskeletal: Positive for gait problem.       LEFT SHOULDER PAIN LEFT FOOT PAIN  Skin:       TENDER SPOT BOTTOM OF LEFT FOOT  Allergic/Immunologic: Negative.   Neurological: Positive for weakness.  Hematological: Negative.   Psychiatric/Behavioral:       DEPRESSION  ANXIETY       Objective:   Physical Exam  Gen: no distress, normal appearing HEENT: oral mucosa pink and moist, NCAT Cardio: Reg rate Chest: normal effort, normal rate of breathing Abd: soft, non-distended Ext: no edema Skin: intact Neuro:Patient communicates through short phrases and words as well as head nods. Primarily expressive aphasia; she understands very well and follows commands well. Right upper extremity with minimal voluntary movement. She has 2+ to 3 out of 5 hip flexor and knee extensor strength right lower extremity. Right ankle in splint with minimal active movement.  Left upper extremity and left lower extremity grossly 4+ to 5 out of 5. No tenderness over left subacromial joint. Cervical myofascial trigger points have improved.  Musculoskeletal:ongoing sublux.Minimal pain with right knee range of motion today, passive or active..Right hamstrings tight. Psych:Pt's affect is pleasant Assessment & Plan:  1. Hx of left MCA infarct with spastic right hemiparesis, expressive greater than receptive aphasia  2. .Primary osteoarthritis right knee 4. Insomnia, anxiety  5. Constipation 6. Right shoulder pain related to chronic subluxation, mild adhesive capsulitis, chronic spasticity  7. OSA.  8. Left rotator cuff tendonitis/bursitis;biciptial tendonitis--generally improved. 9. Right rotator cuff tendonitis/right shoulder sublux 10.Right sided neuropathic pain 11. NON-healing left medial ankle wound.   Plan:  1.Can continue daily ambulation and exercise at facility. Focus should be on mechanics and strengthening of the lower extremities, particularly the right side. Also needs to improve hamstring flexibility. Prescribed occupational therapy to maximize range of motion of left shoulder. Added physical therapy for strengthening of lower extremities.  2.Continue Gabapentin dose 300mg  TID for pain control. Does not need refill today.   3. Can stop right shoulder harness which is providing minimal benefit.  4. Corticosteroid injection appears to have improved pain as well as range of motion. Trigger points improved. Discussed applying the compounded cream there up to 4 times per day. No need to repeat at this time. Daughter will call me if repeated needed. 5. Left foot swelling resolved after foreign body removal.   All questions answered. RTC in 3 months.

## 2020-03-02 ENCOUNTER — Encounter: Payer: Medicare PPO | Attending: Physical Medicine and Rehabilitation | Admitting: Physical Medicine and Rehabilitation

## 2020-03-02 ENCOUNTER — Encounter: Payer: Self-pay | Admitting: Physical Medicine and Rehabilitation

## 2020-03-02 ENCOUNTER — Other Ambulatory Visit: Payer: Self-pay

## 2020-03-02 VITALS — BP 154/85 | HR 67 | Temp 97.8°F

## 2020-03-02 DIAGNOSIS — G8110 Spastic hemiplegia affecting unspecified side: Secondary | ICD-10-CM | POA: Diagnosis not present

## 2020-03-02 DIAGNOSIS — I69320 Aphasia following cerebral infarction: Secondary | ICD-10-CM | POA: Insufficient documentation

## 2020-03-02 DIAGNOSIS — M7522 Bicipital tendinitis, left shoulder: Secondary | ICD-10-CM | POA: Diagnosis not present

## 2020-03-02 DIAGNOSIS — M79672 Pain in left foot: Secondary | ICD-10-CM | POA: Insufficient documentation

## 2020-03-02 DIAGNOSIS — S46002S Unspecified injury of muscle(s) and tendon(s) of the rotator cuff of left shoulder, sequela: Secondary | ICD-10-CM | POA: Insufficient documentation

## 2020-03-03 DIAGNOSIS — I69351 Hemiplegia and hemiparesis following cerebral infarction affecting right dominant side: Secondary | ICD-10-CM | POA: Diagnosis not present

## 2020-03-03 DIAGNOSIS — M25512 Pain in left shoulder: Secondary | ICD-10-CM | POA: Diagnosis not present

## 2020-03-03 DIAGNOSIS — M6281 Muscle weakness (generalized): Secondary | ICD-10-CM | POA: Diagnosis not present

## 2020-03-07 ENCOUNTER — Other Ambulatory Visit: Payer: Self-pay | Admitting: Internal Medicine

## 2020-03-07 ENCOUNTER — Ambulatory Visit: Payer: Medicare PPO | Admitting: Physical Medicine and Rehabilitation

## 2020-03-07 DIAGNOSIS — I69351 Hemiplegia and hemiparesis following cerebral infarction affecting right dominant side: Secondary | ICD-10-CM | POA: Diagnosis not present

## 2020-03-07 DIAGNOSIS — M25512 Pain in left shoulder: Secondary | ICD-10-CM | POA: Diagnosis not present

## 2020-03-07 DIAGNOSIS — M6281 Muscle weakness (generalized): Secondary | ICD-10-CM | POA: Diagnosis not present

## 2020-03-09 ENCOUNTER — Encounter (INDEPENDENT_AMBULATORY_CARE_PROVIDER_SITE_OTHER): Payer: Medicare Other | Admitting: Ophthalmology

## 2020-03-09 DIAGNOSIS — I69351 Hemiplegia and hemiparesis following cerebral infarction affecting right dominant side: Secondary | ICD-10-CM | POA: Diagnosis not present

## 2020-03-09 DIAGNOSIS — M25512 Pain in left shoulder: Secondary | ICD-10-CM | POA: Diagnosis not present

## 2020-03-09 DIAGNOSIS — M6281 Muscle weakness (generalized): Secondary | ICD-10-CM | POA: Diagnosis not present

## 2020-03-10 DIAGNOSIS — I69351 Hemiplegia and hemiparesis following cerebral infarction affecting right dominant side: Secondary | ICD-10-CM | POA: Diagnosis not present

## 2020-03-10 DIAGNOSIS — M25512 Pain in left shoulder: Secondary | ICD-10-CM | POA: Diagnosis not present

## 2020-03-10 DIAGNOSIS — M6281 Muscle weakness (generalized): Secondary | ICD-10-CM | POA: Diagnosis not present

## 2020-03-13 DIAGNOSIS — M25512 Pain in left shoulder: Secondary | ICD-10-CM | POA: Diagnosis not present

## 2020-03-13 DIAGNOSIS — M6281 Muscle weakness (generalized): Secondary | ICD-10-CM | POA: Diagnosis not present

## 2020-03-13 DIAGNOSIS — I69351 Hemiplegia and hemiparesis following cerebral infarction affecting right dominant side: Secondary | ICD-10-CM | POA: Diagnosis not present

## 2020-03-15 DIAGNOSIS — M25512 Pain in left shoulder: Secondary | ICD-10-CM | POA: Diagnosis not present

## 2020-03-15 DIAGNOSIS — M6281 Muscle weakness (generalized): Secondary | ICD-10-CM | POA: Diagnosis not present

## 2020-03-15 DIAGNOSIS — I69351 Hemiplegia and hemiparesis following cerebral infarction affecting right dominant side: Secondary | ICD-10-CM | POA: Diagnosis not present

## 2020-03-20 DIAGNOSIS — M6281 Muscle weakness (generalized): Secondary | ICD-10-CM | POA: Diagnosis not present

## 2020-03-20 DIAGNOSIS — M25512 Pain in left shoulder: Secondary | ICD-10-CM | POA: Diagnosis not present

## 2020-03-20 DIAGNOSIS — I69351 Hemiplegia and hemiparesis following cerebral infarction affecting right dominant side: Secondary | ICD-10-CM | POA: Diagnosis not present

## 2020-03-21 ENCOUNTER — Other Ambulatory Visit: Payer: Self-pay

## 2020-03-21 ENCOUNTER — Ambulatory Visit: Payer: Medicare PPO | Admitting: Podiatry

## 2020-03-21 DIAGNOSIS — M6281 Muscle weakness (generalized): Secondary | ICD-10-CM | POA: Diagnosis not present

## 2020-03-21 DIAGNOSIS — B351 Tinea unguium: Secondary | ICD-10-CM | POA: Diagnosis not present

## 2020-03-21 DIAGNOSIS — M2042 Other hammer toe(s) (acquired), left foot: Secondary | ICD-10-CM

## 2020-03-21 DIAGNOSIS — M79674 Pain in right toe(s): Secondary | ICD-10-CM | POA: Diagnosis not present

## 2020-03-21 DIAGNOSIS — M2041 Other hammer toe(s) (acquired), right foot: Secondary | ICD-10-CM | POA: Diagnosis not present

## 2020-03-21 DIAGNOSIS — L84 Corns and callosities: Secondary | ICD-10-CM

## 2020-03-21 DIAGNOSIS — M79675 Pain in left toe(s): Secondary | ICD-10-CM | POA: Diagnosis not present

## 2020-03-21 DIAGNOSIS — I69351 Hemiplegia and hemiparesis following cerebral infarction affecting right dominant side: Secondary | ICD-10-CM | POA: Diagnosis not present

## 2020-03-21 DIAGNOSIS — M25512 Pain in left shoulder: Secondary | ICD-10-CM | POA: Diagnosis not present

## 2020-03-21 NOTE — Patient Instructions (Signed)
With dr Elisha Ponder

## 2020-03-22 ENCOUNTER — Encounter: Payer: Self-pay | Admitting: Podiatry

## 2020-03-22 ENCOUNTER — Other Ambulatory Visit: Payer: Self-pay | Admitting: Internal Medicine

## 2020-03-22 DIAGNOSIS — M792 Neuralgia and neuritis, unspecified: Secondary | ICD-10-CM

## 2020-03-22 DIAGNOSIS — M6281 Muscle weakness (generalized): Secondary | ICD-10-CM | POA: Diagnosis not present

## 2020-03-22 DIAGNOSIS — M25512 Pain in left shoulder: Secondary | ICD-10-CM | POA: Diagnosis not present

## 2020-03-22 DIAGNOSIS — I69351 Hemiplegia and hemiparesis following cerebral infarction affecting right dominant side: Secondary | ICD-10-CM | POA: Diagnosis not present

## 2020-03-22 NOTE — Telephone Encounter (Signed)
Medication was sent in for 40 mg Lipitor per Dr. Magdalene Molly  note on 10/27/2019:

## 2020-03-22 NOTE — Progress Notes (Signed)
  Subjective:  Patient ID: Kristin [name], female    DOB: Apr 08, 1941,  MRN: [id number]  Chief Complaint  Patient presents with  . Callouses    3 month nail and callous care    79 y.o. female presents with the above complaint. History confirmed with patient.  Here with her daughter.  The nails and calluses are painful.  She has a history of stroke.  Objective:  Physical Exam: warm, good capillary refill, no trophic changes or ulcerative lesions, normal DP and PT pulses, normal sensory exam and onychomycosis x10.  She has rigid hammertoe deformities with preulcerative calluses on the tips of the second third toes bilaterally  Assessment:  No diagnosis found.   Plan:  Patient was evaluated and treated and all questions answered.   Discussed the etiology and treatment options for the condition in detail with the patient. Educated patient on the topical and oral treatment options for mycotic nails. Recommended debridement of the nails today. Sharp and mechanical debridement performed of all painful and mycotic nails today. Nails debrided in length and thickness using a nail nipper and a mechanical burr to level of comfort. Discussed treatment options including appropriate shoe gear. Follow up as needed for painful nails.  All symptomatic hyperkeratoses were safely debrided with a sterile #15 blade to patient's level of comfort without incident. We discussed preventative and palliative care of these lesions including supportive and accommodative shoegear, padding, prefabricated and custom molded accommodative orthoses, use of a pumice stone and lotions/creams daily.  With the semirigid nature of her hammertoes and on the fibula to correct the calluses on the distal tips of the toes with a flexor tenotomy.  Given her age and comorbidities I do not think hammertoe correction would be in her best interest.  Recommend continue with the offloading silicone pads.  Return in about 3 months (around  06/19/2020).

## 2020-03-23 ENCOUNTER — Other Ambulatory Visit: Payer: Self-pay

## 2020-03-23 ENCOUNTER — Encounter (INDEPENDENT_AMBULATORY_CARE_PROVIDER_SITE_OTHER): Payer: Medicare PPO | Admitting: Ophthalmology

## 2020-03-23 DIAGNOSIS — H35033 Hypertensive retinopathy, bilateral: Secondary | ICD-10-CM

## 2020-03-23 DIAGNOSIS — H43813 Vitreous degeneration, bilateral: Secondary | ICD-10-CM

## 2020-03-23 DIAGNOSIS — H34811 Central retinal vein occlusion, right eye, with macular edema: Secondary | ICD-10-CM

## 2020-03-23 DIAGNOSIS — M6281 Muscle weakness (generalized): Secondary | ICD-10-CM | POA: Diagnosis not present

## 2020-03-23 DIAGNOSIS — I10 Essential (primary) hypertension: Secondary | ICD-10-CM | POA: Diagnosis not present

## 2020-03-23 DIAGNOSIS — I69351 Hemiplegia and hemiparesis following cerebral infarction affecting right dominant side: Secondary | ICD-10-CM | POA: Diagnosis not present

## 2020-03-23 DIAGNOSIS — M25512 Pain in left shoulder: Secondary | ICD-10-CM | POA: Diagnosis not present

## 2020-03-27 DIAGNOSIS — M25512 Pain in left shoulder: Secondary | ICD-10-CM | POA: Diagnosis not present

## 2020-03-27 DIAGNOSIS — I69351 Hemiplegia and hemiparesis following cerebral infarction affecting right dominant side: Secondary | ICD-10-CM | POA: Diagnosis not present

## 2020-03-27 DIAGNOSIS — M6281 Muscle weakness (generalized): Secondary | ICD-10-CM | POA: Diagnosis not present

## 2020-03-28 DIAGNOSIS — M25512 Pain in left shoulder: Secondary | ICD-10-CM | POA: Diagnosis not present

## 2020-03-28 DIAGNOSIS — I69351 Hemiplegia and hemiparesis following cerebral infarction affecting right dominant side: Secondary | ICD-10-CM | POA: Diagnosis not present

## 2020-03-28 DIAGNOSIS — M6281 Muscle weakness (generalized): Secondary | ICD-10-CM | POA: Diagnosis not present

## 2020-03-30 ENCOUNTER — Encounter (INDEPENDENT_AMBULATORY_CARE_PROVIDER_SITE_OTHER): Payer: Medicare PPO | Admitting: Ophthalmology

## 2020-03-30 ENCOUNTER — Other Ambulatory Visit: Payer: Self-pay

## 2020-03-30 DIAGNOSIS — H34811 Central retinal vein occlusion, right eye, with macular edema: Secondary | ICD-10-CM | POA: Diagnosis not present

## 2020-03-31 DIAGNOSIS — M6281 Muscle weakness (generalized): Secondary | ICD-10-CM | POA: Diagnosis not present

## 2020-03-31 DIAGNOSIS — I69351 Hemiplegia and hemiparesis following cerebral infarction affecting right dominant side: Secondary | ICD-10-CM | POA: Diagnosis not present

## 2020-03-31 DIAGNOSIS — M25512 Pain in left shoulder: Secondary | ICD-10-CM | POA: Diagnosis not present

## 2020-04-04 DIAGNOSIS — M6281 Muscle weakness (generalized): Secondary | ICD-10-CM | POA: Diagnosis not present

## 2020-04-04 DIAGNOSIS — M25512 Pain in left shoulder: Secondary | ICD-10-CM | POA: Diagnosis not present

## 2020-04-04 DIAGNOSIS — I69351 Hemiplegia and hemiparesis following cerebral infarction affecting right dominant side: Secondary | ICD-10-CM | POA: Diagnosis not present

## 2020-04-06 DIAGNOSIS — I69351 Hemiplegia and hemiparesis following cerebral infarction affecting right dominant side: Secondary | ICD-10-CM | POA: Diagnosis not present

## 2020-04-06 DIAGNOSIS — M25512 Pain in left shoulder: Secondary | ICD-10-CM | POA: Diagnosis not present

## 2020-04-06 DIAGNOSIS — M6281 Muscle weakness (generalized): Secondary | ICD-10-CM | POA: Diagnosis not present

## 2020-04-11 ENCOUNTER — Ambulatory Visit: Payer: Medicare PPO | Admitting: Physical Medicine and Rehabilitation

## 2020-04-11 DIAGNOSIS — M6281 Muscle weakness (generalized): Secondary | ICD-10-CM | POA: Diagnosis not present

## 2020-04-11 DIAGNOSIS — M25512 Pain in left shoulder: Secondary | ICD-10-CM | POA: Diagnosis not present

## 2020-04-11 DIAGNOSIS — I69351 Hemiplegia and hemiparesis following cerebral infarction affecting right dominant side: Secondary | ICD-10-CM | POA: Diagnosis not present

## 2020-04-18 DIAGNOSIS — I69351 Hemiplegia and hemiparesis following cerebral infarction affecting right dominant side: Secondary | ICD-10-CM | POA: Diagnosis not present

## 2020-04-18 DIAGNOSIS — M6281 Muscle weakness (generalized): Secondary | ICD-10-CM | POA: Diagnosis not present

## 2020-04-18 DIAGNOSIS — M25512 Pain in left shoulder: Secondary | ICD-10-CM | POA: Diagnosis not present

## 2020-04-20 DIAGNOSIS — I69351 Hemiplegia and hemiparesis following cerebral infarction affecting right dominant side: Secondary | ICD-10-CM | POA: Diagnosis not present

## 2020-04-20 DIAGNOSIS — M25512 Pain in left shoulder: Secondary | ICD-10-CM | POA: Diagnosis not present

## 2020-04-20 DIAGNOSIS — M6281 Muscle weakness (generalized): Secondary | ICD-10-CM | POA: Diagnosis not present

## 2020-04-25 DIAGNOSIS — I69351 Hemiplegia and hemiparesis following cerebral infarction affecting right dominant side: Secondary | ICD-10-CM | POA: Diagnosis not present

## 2020-04-25 DIAGNOSIS — M6281 Muscle weakness (generalized): Secondary | ICD-10-CM | POA: Diagnosis not present

## 2020-04-27 ENCOUNTER — Ambulatory Visit: Payer: Medicare PPO | Admitting: Internal Medicine

## 2020-04-28 DIAGNOSIS — M6281 Muscle weakness (generalized): Secondary | ICD-10-CM | POA: Diagnosis not present

## 2020-04-28 DIAGNOSIS — I69351 Hemiplegia and hemiparesis following cerebral infarction affecting right dominant side: Secondary | ICD-10-CM | POA: Diagnosis not present

## 2020-05-02 DIAGNOSIS — I69351 Hemiplegia and hemiparesis following cerebral infarction affecting right dominant side: Secondary | ICD-10-CM | POA: Diagnosis not present

## 2020-05-02 DIAGNOSIS — M6281 Muscle weakness (generalized): Secondary | ICD-10-CM | POA: Diagnosis not present

## 2020-05-03 ENCOUNTER — Other Ambulatory Visit: Payer: Self-pay

## 2020-05-03 ENCOUNTER — Encounter (INDEPENDENT_AMBULATORY_CARE_PROVIDER_SITE_OTHER): Payer: Medicare PPO | Admitting: Ophthalmology

## 2020-05-03 ENCOUNTER — Other Ambulatory Visit: Payer: Self-pay | Admitting: Internal Medicine

## 2020-05-03 DIAGNOSIS — I10 Essential (primary) hypertension: Secondary | ICD-10-CM

## 2020-05-03 DIAGNOSIS — H43813 Vitreous degeneration, bilateral: Secondary | ICD-10-CM | POA: Diagnosis not present

## 2020-05-03 DIAGNOSIS — H34811 Central retinal vein occlusion, right eye, with macular edema: Secondary | ICD-10-CM | POA: Diagnosis not present

## 2020-05-03 DIAGNOSIS — H35033 Hypertensive retinopathy, bilateral: Secondary | ICD-10-CM

## 2020-05-03 NOTE — Telephone Encounter (Signed)
Pharmacy requested refill °Pended Rx and sent to Dr. Reed for approval due to HIGH ALERT Warning.  °

## 2020-05-05 DIAGNOSIS — I69351 Hemiplegia and hemiparesis following cerebral infarction affecting right dominant side: Secondary | ICD-10-CM | POA: Diagnosis not present

## 2020-05-05 DIAGNOSIS — M6281 Muscle weakness (generalized): Secondary | ICD-10-CM | POA: Diagnosis not present

## 2020-05-10 DIAGNOSIS — M6281 Muscle weakness (generalized): Secondary | ICD-10-CM | POA: Diagnosis not present

## 2020-05-10 DIAGNOSIS — I69351 Hemiplegia and hemiparesis following cerebral infarction affecting right dominant side: Secondary | ICD-10-CM | POA: Diagnosis not present

## 2020-05-12 DIAGNOSIS — I69351 Hemiplegia and hemiparesis following cerebral infarction affecting right dominant side: Secondary | ICD-10-CM | POA: Diagnosis not present

## 2020-05-12 DIAGNOSIS — M6281 Muscle weakness (generalized): Secondary | ICD-10-CM | POA: Diagnosis not present

## 2020-05-16 DIAGNOSIS — M6281 Muscle weakness (generalized): Secondary | ICD-10-CM | POA: Diagnosis not present

## 2020-05-16 DIAGNOSIS — I69351 Hemiplegia and hemiparesis following cerebral infarction affecting right dominant side: Secondary | ICD-10-CM | POA: Diagnosis not present

## 2020-05-18 DIAGNOSIS — M6281 Muscle weakness (generalized): Secondary | ICD-10-CM | POA: Diagnosis not present

## 2020-05-18 DIAGNOSIS — I69351 Hemiplegia and hemiparesis following cerebral infarction affecting right dominant side: Secondary | ICD-10-CM | POA: Diagnosis not present

## 2020-05-29 ENCOUNTER — Encounter: Payer: Self-pay | Admitting: Internal Medicine

## 2020-05-29 ENCOUNTER — Ambulatory Visit (INDEPENDENT_AMBULATORY_CARE_PROVIDER_SITE_OTHER): Payer: Medicare PPO | Admitting: Internal Medicine

## 2020-05-29 ENCOUNTER — Other Ambulatory Visit: Payer: Self-pay

## 2020-05-29 VITALS — BP 122/78 | HR 73 | Temp 97.3°F | Ht 66.0 in | Wt 161.0 lb

## 2020-05-29 DIAGNOSIS — M75102 Unspecified rotator cuff tear or rupture of left shoulder, not specified as traumatic: Secondary | ICD-10-CM

## 2020-05-29 DIAGNOSIS — I69320 Aphasia following cerebral infarction: Secondary | ICD-10-CM

## 2020-05-29 DIAGNOSIS — M4802 Spinal stenosis, cervical region: Secondary | ICD-10-CM

## 2020-05-29 DIAGNOSIS — I25110 Atherosclerotic heart disease of native coronary artery with unstable angina pectoris: Secondary | ICD-10-CM

## 2020-05-29 DIAGNOSIS — M12812 Other specific arthropathies, not elsewhere classified, left shoulder: Secondary | ICD-10-CM

## 2020-05-29 DIAGNOSIS — G8110 Spastic hemiplegia affecting unspecified side: Secondary | ICD-10-CM | POA: Diagnosis not present

## 2020-05-29 DIAGNOSIS — I10 Essential (primary) hypertension: Secondary | ICD-10-CM | POA: Diagnosis not present

## 2020-05-29 NOTE — Progress Notes (Signed)
Location:  [facility] clinic Provider:  Cherryl Babin L. Mariea Clonts, D.O., C.M.D.  Goals of Care:  Advanced Directives 05/29/2020  Does Patient Have a Medical Advance Directive? Yes  Type of Advance Directive Out of facility DNR (pink MOST or yellow form);Living will  Does patient want to make changes to medical advance directive? No - Patient declined  Copy of [place] in Chart? -  Would patient like information on creating a medical advance directive? -  Pre-existing out of facility DNR order (yellow form or pink MOST form) Pink MOST form placed in chart (order not valid for inpatient use)     Chief Complaint  Patient presents with  . Medical Management of Chronic Issues    6 month follow up     HPI: Patient is a 80 y.o. female seen today for medical management of chronic diseases.    She gets her cortisone shots in her shoulder.    She's going to get her shingrix on Wednesday.  Had one fall getting out of bed to go to the bathroom.  She was sore for a while.  Paramedics helped get her up, but she'd had to use the bathroom really bad.    She's had a decrease in strength getting in and out of bed and today in and out of the car.  She is still getting some PT, but not making as much headway.    She's put on a few lbs.  She has a good appetite.    Sleeping in recliner b/c hard to get in and out of bed.    Sees urology--has constant irritation. She needs to be drinking more water.    Not constipated.  Not having loose bms either.  Uses manual wheelchair for transport, scooter for distances, walker to transfer.    Her stepdaughters are getting her extra help in the mornings and evenings for getting ready and getting back into bed.  Past Medical History:  Diagnosis Date  . Arthritis    hands  . CAD (coronary artery disease)    a. 2010: stents pRCA and dRCA. b. LHC 12/13/13: Minimal distal LM dz. LAD 95% ostial w/ L-->R collat. LCx mild dz. RCA 95% mid-dRCA. Patent stents  proximal and distal RCA. EF not assessed, Nl by nuclear study.     . Central retinal vein occlusion, right eye   . Chronic kidney disease    hx kidney stone  . Depression   . Diplopia   . Ear pain    right  . Eosinophilic esophagitis   . Essential hypertension   . GERD (gastroesophageal reflux disease)   . H/O: whooping cough   . Hyperlipidemia with target LDL less than 70   . Incontinence    urine  . Stroke [facility]) 2001   reesultant Rt spastic hemplegia and aphasia     Past Surgical History:  Procedure Laterality Date  . ABDOMINAL HYSTERECTOMY    . CAROTID STENT    . cataracts    . COLONOSCOPY N/A 10/05/2015   Procedure: COLONOSCOPY;  Surgeon: Manus Gunning, MD;  Location: [facility];  Service: Gastroenterology;  Laterality: N/A;  . CORONARY ARTERY BYPASS GRAFT N/A 12/17/2013   Procedure: CORONARY ARTERY BYPASS GRAFTING (CABG);  Surgeon: Gaye Pollack, MD;  Location: [place];  Service: Open Heart Surgery;  Laterality: N/A;  Times 2 using left internal mammary artery and endoscopically harvested right saphenous vein  . ESOPHAGOGASTRODUODENOSCOPY    . ESOPHAGOGASTRODUODENOSCOPY N/A 10/05/2015   Procedure:  ESOPHAGOGASTRODUODENOSCOPY (EGD);  Surgeon: Manus Gunning, MD;  Location: [facility];  Service: Gastroenterology;  Laterality: N/A;  . INTRAOPERATIVE TRANSESOPHAGEAL ECHOCARDIOGRAM N/A 12/17/2013   Procedure: INTRAOPERATIVE TRANSESOPHAGEAL ECHOCARDIOGRAM;  Surgeon: Gaye Pollack, MD;  Location: [place] [facility];  Service: Open Heart Surgery;  Laterality: N/A;  . lasix    . LEFT HEART CATHETERIZATION WITH CORONARY ANGIOGRAM N/A 12/13/2013   Procedure: LEFT HEART CATHETERIZATION WITH CORONARY ANGIOGRAM;  Surgeon: Jettie Booze, MD;  Location: [facility] C[facility] LAB;  Service: Cardiovascular;  Laterality: N/A;  . retina injection      No Known Allergies  Outpatient Encounter Medications as of 05/29/2020  Medication Sig  . aspirin 81 MG tablet Take 81 mg by mouth daily.  Marland Kitchen  atorvastatin (LIPITOR) 40 MG tablet Take 1 tablet (40 mg total) by mouth daily.  . clopidogrel (PLAVIX) 75 MG tablet TAKE 1 TABLET EACH DAY.  Marland Kitchen Coenzyme Q10 (COQ-10 PO) Take 1 tablet daily by mouth.  . desmopressin (DDAVP) 0.2 MG tablet Take 0.2 mg by mouth at bedtime. 2 tablets at bedtime  . diclofenac sodium (VOLTAREN) 1 % GEL Apply 4 g topically 4 (four) times daily as needed (pain).   Marland Kitchen diclofenac Sodium (VOLTAREN) 1 % GEL APPLY TO AFFECTED AREA THREE TIMES DAILY.  . DULoxetine (CYMBALTA) 20 MG capsule TAKE (1) CAPSULE DAILY.  Marland Kitchen estradiol (ESTRACE) 0.1 MG/GM vaginal cream   . fluticasone (CUTIVATE) 0.05 % cream   . gabapentin (NEURONTIN) 300 MG capsule TAKE (1) CAPSULE THREE TIMES DAILY.  Marland Kitchen ketoconazole (NIZORAL) 2 % cream   . mirabegron ER (MYRBETRIQ) 50 MG TB24 tablet Take 50 mg by mouth daily.   . nitroGLYCERIN (NITROSTAT) 0.4 MG SL tablet Place 1 tablet (0.4 mg total) under the tongue every 5 (five) minutes as needed for chest pain.  Marland Kitchen omeprazole (PRILOSEC) 20 MG capsule TAKE 1 CAPSULE EVERY DAY.  Marland Kitchen trimethoprim (TRIMPEX) 100 MG tablet Take by mouth.  . TURMERIC PO Take 1 tablet by mouth daily.   . [DISCONTINUED] atorvastatin (LIPITOR) 20 MG tablet TAKE 1 TABLET EACH DAY.  . [DISCONTINUED] losartan (COZAAR) 25 MG tablet    No facility-administered encounter medications on file as of 05/29/2020.    Review of Systems:  Review of Systems  Constitutional: Negative for chills and fever.       Wt gain but bmi still good for her age  HENT: Negative for congestion and sore throat.   Respiratory: Negative for cough and shortness of breath.   Cardiovascular: Positive for leg swelling. Negative for chest pain and palpitations.  Gastrointestinal: Negative for abdominal pain, constipation and diarrhea.  Genitourinary: Negative for dysuria.       Bladder spasms  Musculoskeletal: Positive for falls and joint pain.  Skin: Negative for rash.  Neurological: Positive for speech change and focal  weakness. Negative for dizziness and loss of consciousness.       Chronic right spastic hemiparesis, foot drop with AFO, dysarthria, aphasia  Endo/Heme/Allergies: Bruises/bleeds easily.  Psychiatric/Behavioral: Negative for depression. The patient is not nervous/anxious and does not have insomnia.     Health Maintenance  Topic Date Due  . DEXA SCAN  Never done  . PNA vac Low Risk Adult (2 of 2 - PCV13) 05/12/2014  . INFLUENZA VACCINE  11/21/2019  . TETANUS/TDAP  05/13/2023  . COVID-19 Vaccine  Completed    Physical Exam: Vitals:   05/29/20 1420  BP: 122/78  Pulse: 73  Temp: (!) 97.3 F (36.3 C)  SpO2: 98%  Weight:  161 lb (73 kg)  Height: 5\' 6"  (1.676 m)   Body mass index is 25.99 kg/m. Physical Exam Vitals reviewed.  Constitutional:      General: She is not in acute distress.    Appearance: Normal appearance. She is not toxic-appearing.  HENT:     Head: Normocephalic and atraumatic.  Eyes:     Extraocular Movements: Extraocular movements intact.     Pupils: Pupils are equal, round, and reactive to light.  Cardiovascular:     Rate and Rhythm: Normal rate and regular rhythm.     Pulses: Normal pulses.     Heart sounds: Normal heart sounds.  Pulmonary:     Effort: Pulmonary effort is normal.     Breath sounds: Normal breath sounds. No wheezing, rhonchi or rales.  Abdominal:     General: Bowel sounds are normal. There is no distension.     Tenderness: There is no abdominal tenderness. There is no guarding or rebound.  Musculoskeletal:     Right lower leg: Edema present.     Left lower leg: Edema present.  Neurological:     General: No focal deficit present.     Mental Status: She is alert.     Cranial Nerves: No cranial nerve deficit.     Gait: Gait abnormal.     Comments: Right spastic hemiparesis, dysarthria, aphasia; came in transport wheelchair, wearing AFO right ankle, tenderness of left neck/shoulder  Psychiatric:        Mood and Affect: Mood normal.      Comments: Very pleasant lady, step-daughter helps translate due to her aphasia     Labs reviewed: Basic Metabolic Panel: Recent Labs    07/07/19 0124 07/08/19 0505 07/20/19 1356  NA 137 136 140  K 4.7 3.0* 4.1  CL 101 105 105  CO2 27 24 26   GLUCOSE 143* 108* 109  BUN 19 10 23   CREATININE 0.61 0.42* 0.61  CALCIUM 8.8* 8.1* 9.1  MG  --  1.9  --    Liver Function Tests: Recent Labs    07/07/19 0124  AST 54*  ALT 43  ALKPHOS 86  BILITOT 1.6*  PROT 7.5  ALBUMIN 4.0   Recent Labs    07/07/19 0124  LIPASE 17   No results for input(s): AMMONIA in the last 8760 hours. CBC: Recent Labs    07/07/19 0128 07/08/19 0505 07/20/19 1356  WBC 10.3 4.4 6.0  NEUTROABS 8.6*  --  3,912  HGB 15.0 12.9 14.1  HCT 46.9* 39.2 43.1  MCV 93.6 92.7 89.8  PLT 201 162 334   Lipid Panel: Recent Labs    10/21/19 0948  CHOL 160  HDL 54  LDLCALC 87  TRIG 99  CHOLHDL 3.0   Lab Results  Component Value Date   HGBA1C 5.4 06/02/2018    Procedures since last visit: No results found.  Assessment/Plan 1. Spastic hemiplegia affecting dominant side (HCC) -chronic, cont PT she's getting and agree with some additional caregiver support for am adls and hs routine  2. Aphasia as late effect of stroke -sometimes writes or draws, stepdaughters helpful with communication   3. Essential hypertension -bp at goal, cont same regimen and monitor  4. Degenerative cervical spinal stenosis -contributory to pain in left shoulder, also, and helped by gabapentin   5. Left rotator cuff tear arthropathy -chronic, gets injections with benefit  6. Atherosclerosis of native coronary artery of native heart with unstable angina pectoris ([place]) -no active symptoms, cont asa, statin, plavix at  this point due to stroke also  Labs/tests ordered:  No new  Next appt:  4 mos med mgt  CMA to be checking database to confirm vaccine dates for flu and pneumonia.    Declined dexa due to  nonambulatory/nonweightbearing status.  Lasonia Casino L. Ardena Gangl, D.O. [facility] [address], [place] [address] Cell Phone (Mon-Fri 8am-5pm):  [phone] On Call:  [phone] & follow prompts after 5pm & weekends Office Phone:  [phone] Office Fax:  [phone]

## 2020-05-31 ENCOUNTER — Encounter (INDEPENDENT_AMBULATORY_CARE_PROVIDER_SITE_OTHER): Payer: Medicare PPO | Admitting: Ophthalmology

## 2020-05-31 ENCOUNTER — Other Ambulatory Visit: Payer: Self-pay

## 2020-05-31 DIAGNOSIS — I10 Essential (primary) hypertension: Secondary | ICD-10-CM

## 2020-05-31 DIAGNOSIS — H43813 Vitreous degeneration, bilateral: Secondary | ICD-10-CM

## 2020-05-31 DIAGNOSIS — H35033 Hypertensive retinopathy, bilateral: Secondary | ICD-10-CM

## 2020-05-31 DIAGNOSIS — H34811 Central retinal vein occlusion, right eye, with macular edema: Secondary | ICD-10-CM

## 2020-06-01 ENCOUNTER — Ambulatory Visit: Payer: Medicare PPO | Admitting: Physical Medicine and Rehabilitation

## 2020-06-12 ENCOUNTER — Encounter: Payer: Self-pay | Admitting: Internal Medicine

## 2020-06-21 ENCOUNTER — Other Ambulatory Visit: Payer: Self-pay

## 2020-06-21 ENCOUNTER — Encounter: Payer: Self-pay | Admitting: Podiatry

## 2020-06-21 ENCOUNTER — Ambulatory Visit: Payer: Medicare PPO | Admitting: Physical Medicine and Rehabilitation

## 2020-06-21 ENCOUNTER — Ambulatory Visit: Payer: Medicare PPO | Admitting: Podiatry

## 2020-06-21 DIAGNOSIS — G5791 Unspecified mononeuropathy of right lower limb: Secondary | ICD-10-CM | POA: Diagnosis not present

## 2020-06-21 DIAGNOSIS — M79674 Pain in right toe(s): Secondary | ICD-10-CM

## 2020-06-21 DIAGNOSIS — M79675 Pain in left toe(s): Secondary | ICD-10-CM

## 2020-06-21 DIAGNOSIS — M2042 Other hammer toe(s) (acquired), left foot: Secondary | ICD-10-CM

## 2020-06-21 DIAGNOSIS — L84 Corns and callosities: Secondary | ICD-10-CM | POA: Diagnosis not present

## 2020-06-21 DIAGNOSIS — B351 Tinea unguium: Secondary | ICD-10-CM

## 2020-06-21 DIAGNOSIS — M2041 Other hammer toe(s) (acquired), right foot: Secondary | ICD-10-CM

## 2020-06-26 ENCOUNTER — Other Ambulatory Visit: Payer: Self-pay | Admitting: Internal Medicine

## 2020-06-26 DIAGNOSIS — Z20828 Contact with and (suspected) exposure to other viral communicable diseases: Secondary | ICD-10-CM | POA: Diagnosis not present

## 2020-06-26 DIAGNOSIS — Z1159 Encounter for screening for other viral diseases: Secondary | ICD-10-CM | POA: Diagnosis not present

## 2020-06-26 DIAGNOSIS — M792 Neuralgia and neuritis, unspecified: Secondary | ICD-10-CM

## 2020-06-26 NOTE — Telephone Encounter (Signed)
High risk or very high risk warning populated when attempting to refill medication. RX request sent to PCP for review and approval if warranted.   

## 2020-06-27 ENCOUNTER — Other Ambulatory Visit: Payer: Self-pay | Admitting: Internal Medicine

## 2020-06-27 ENCOUNTER — Other Ambulatory Visit: Payer: Self-pay | Admitting: Physical Medicine and Rehabilitation

## 2020-06-27 MED ORDER — ATORVASTATIN CALCIUM 40 MG PO TABS
40.0000 mg | ORAL_TABLET | Freq: Every day | ORAL | 1 refills | Status: DC
Start: 2020-06-27 — End: 2020-12-21

## 2020-06-28 NOTE — Progress Notes (Signed)
Subjective: Kristin [name] is a 80 y.o. female patient seen today at risk foot care with history of peripheral neuropathy and corn(s) of both feet and painful mycotic toenails b/l that are difficult to trim. Pain interferes with ambulation. Aggravating factors include wearing enclosed shoe gear. Pain is relieved with periodic professional debridement.   She has h/o CVA and aphasia.  Her daughter is present during today's visit.   PCP is Dr. Hollace Kinnier. Last visit was  05/29/2020.  No Known Allergies  Objective: Physical Exam  General: Kristin [name] is a pleasant 80 y.o.  Caucasian female, in NAD. AAO x 3.   Vascular:  Neurovascular status unchanged b/l. Capillary refill time to digits immediate b/l. Palpable DP pulses b/l. Palpable PT pulses b/l. Pedal hair absent b/l Skin temperature gradient within normal limits b/l. No edema noted b/l.  Dermatological:  Pedal skin with normal turgor, texture and tone bilaterally. No open wounds bilaterally. No interdigital macerations bilaterally. Toenails 1-5 b/l elongated, dystrophic, thickened, crumbly with subungual debris and tenderness to dorsal palpation. Hyperkeratotic lesion(s) R 3rd toe.  No erythema, no edema, no drainage, no flocculence.  Musculoskeletal:  Normal muscle strength 5/5 to all lower extremity muscle groups bilaterally. No gross bony deformities bilaterally. No pain crepitus or joint limitation noted with ROM b/l. Clawtoes 1-5 right foot. Wearing AFO for right LE.  Neurological:  Protective sensation intact with 10 gram monofilament left lower extremity. Protective sensation decreased with 10 gram monofilament right lower extremity.  Assessment and Plan:  1. Pain due to onychomycosis of toenails of both feet   2. Corns   3. Acquired hammertoes of both feet   4. Neuropathy of right lower extremity    -Examined patient. -No new findings. No new orders. -Toenails 1-5 b/l were debrided in length and girth with sterile nail  nippers and dremel without iatrogenic bleeding.  -Corn(s) R 3rd toe pared utilizing sterile scalpel blade without complication or incident. Total number debrided=1. -Patient to continue soft, supportive shoe gear daily. -Patient to report any pedal injuries to medical professional immediately. -Patient/POA to call should there be question/concern in the interim.  Return in about 3 months (around 09/21/2020).  Marzetta Board, DPM

## 2020-07-03 DIAGNOSIS — Z20828 Contact with and (suspected) exposure to other viral communicable diseases: Secondary | ICD-10-CM | POA: Diagnosis not present

## 2020-07-03 DIAGNOSIS — Z1159 Encounter for screening for other viral diseases: Secondary | ICD-10-CM | POA: Diagnosis not present

## 2020-07-05 ENCOUNTER — Encounter: Payer: Medicare PPO | Attending: Physical Medicine and Rehabilitation | Admitting: Physical Medicine and Rehabilitation

## 2020-07-05 ENCOUNTER — Other Ambulatory Visit: Payer: Self-pay

## 2020-07-05 ENCOUNTER — Encounter: Payer: Self-pay | Admitting: Physical Medicine and Rehabilitation

## 2020-07-05 VITALS — BP 145/81 | HR 69 | Temp 98.1°F

## 2020-07-05 DIAGNOSIS — M7591 Shoulder lesion, unspecified, right shoulder: Secondary | ICD-10-CM | POA: Insufficient documentation

## 2020-07-05 DIAGNOSIS — M7592 Shoulder lesion, unspecified, left shoulder: Secondary | ICD-10-CM | POA: Diagnosis not present

## 2020-07-05 DIAGNOSIS — M25511 Pain in right shoulder: Secondary | ICD-10-CM | POA: Insufficient documentation

## 2020-07-05 DIAGNOSIS — G8110 Spastic hemiplegia affecting unspecified side: Secondary | ICD-10-CM | POA: Diagnosis not present

## 2020-07-05 DIAGNOSIS — G8929 Other chronic pain: Secondary | ICD-10-CM | POA: Diagnosis not present

## 2020-07-05 DIAGNOSIS — W19XXXS Unspecified fall, sequela: Secondary | ICD-10-CM | POA: Insufficient documentation

## 2020-07-05 DIAGNOSIS — M25512 Pain in left shoulder: Secondary | ICD-10-CM | POA: Insufficient documentation

## 2020-07-05 NOTE — Progress Notes (Signed)
Subjective:    Patient ID: Kristin [name], female    DOB: 25-Apr-1940, 80 y.o.   MRN: [id number]  HPI  Kristin [name] presents for follow-up of bilateral shoulder pain and right sided hemiplegia.  1) Bilateral shoulder pain -Pain has been stable -Does not feel she needs repeat shoulder injection -worse with activity -she has completed OT -she continues to take Gabapentin 342m TID  2) Fall -no more falls since last visit -she has completed PT -She continues to have difficulty getting in and out of the bed and difficulty flexing the right hip.  -She continues to sleep in the recliner.   3) right sided spasticity -much improved -continues daily stretching regimen  Prior history: Overall she is doing much better  Shoulder pain improved with OT and injection.  I am meeting with the patient today for follow-up of left MCA infarct and associated deficits. [name] and is not as aware of her daily activity at her facility.   Her left shoulder pain and range of motion are much improved from corticosteroid injection. No need to repeat injection at this time.   Her pain in her right arm and leg is much improved since increasing her Gabapentin to 3081mTID. She continues on this dose. She does not need a refill today.  The patient demonstrated some of the stretches she is doing on her own. This does not make her drowsy.   She is falling asleep well at night but nocturia wakes her 3-4 times.   Urology has gone up up on DDAVP and this has helped  Podiatry removed foreign body in foot and pain has resolved.   Mood has been good. She is not eating as well as usual since she has less communal dining events at her center. She is not involved in any physical therapy but tries to stretch out her hand herself. She is able to ambulate within her room and use the rest room. She has a lot of joy in her life in living in the center. She loves listening to music.   Kristin [name] has  continued outpatient follow-up with urology and continues on DDAVP and Myrbetriq for urinary incontinence. Her sleep continues to be disturbed as a result of this.   Cervical myofascial pain: For now, continue to apply compounded cream up to 4 times per day. Has improved with OT  Pain Inventory Average Pain 5 Pain Right Now 4 My pain is intermittent, sharp, dull, stabbing, tingling and aching  In the last 24 hours, has pain interfered with the following? General activity 5 Relation with others 5 Enjoyment of life 5 What TIME of day is your pain at its worst? morning, daytime, evening, night Sleep (in general) Fair  Pain is worse with: walking, sitting and standing Pain improves with: heat/ice and medication Relief from Meds: 1      Family History  Problem Relation Age of Onset  . Diabetes Mother   . Heart disease Mother   . Heart attack Father   . Breast cancer Sister    Social History   Socioeconomic History  . Marital status: Married    Spouse name: Not on file  . Number of children: 2  . Years of education: Not on file  . Highest education level: Not on file  Occupational History  . Occupation: retired scEducation officer, museumTobacco Use  . Smoking status: Never Smoker  . Smokeless tobacco: Never Used  Vaping Use  . Vaping Use:  Never used  Substance and Sexual Activity  . Alcohol use: Yes    Alcohol/week: 0.0 standard drinks    Comment: glass of wine a day  . Drug use: No  . Sexual activity: Never  Other Topics Concern  . Not on file  Social History Narrative  . Not on file   Social Determinants of Health   Financial Resource Strain: Not on file  Food Insecurity: Not on file  Transportation Needs: Not on file  Physical Activity: Not on file  Stress: Not on file  Social Connections: Not on file   Past Surgical History:  Procedure Laterality Date  . ABDOMINAL HYSTERECTOMY    . CAROTID STENT    . cataracts    . COLONOSCOPY N/A 10/05/2015   Procedure:  COLONOSCOPY;  Surgeon: Manus Gunning, MD;  Location: [facility];  Service: Gastroenterology;  Laterality: N/A;  . CORONARY ARTERY BYPASS GRAFT N/A 12/17/2013   Procedure: CORONARY ARTERY BYPASS GRAFTING (CABG);  Surgeon: Gaye Pollack, MD;  Location: [facility];  Service: Open Heart Surgery;  Laterality: N/A;  Times 2 using left internal mammary artery and endoscopically harvested right saphenous vein  . ESOPHAGOGASTRODUODENOSCOPY    . ESOPHAGOGASTRODUODENOSCOPY N/A 10/05/2015   Procedure: ESOPHAGOGASTRODUODENOSCOPY (EGD);  Surgeon: Manus Gunning, MD;  Location: [facility];  Service: Gastroenterology;  Laterality: N/A;  . INTRAOPERATIVE TRANSESOPHAGEAL ECHOCARDIOGRAM N/A 12/17/2013   Procedure: INTRAOPERATIVE TRANSESOPHAGEAL ECHOCARDIOGRAM;  Surgeon: Gaye Pollack, MD;  Location: [facility];  Service: Open Heart Surgery;  Laterality: N/A;  . lasix    . LEFT HEART CATHETERIZATION WITH CORONARY ANGIOGRAM N/A 12/13/2013   Procedure: LEFT HEART CATHETERIZATION WITH CORONARY ANGIOGRAM;  Surgeon: Jettie Booze, MD;  Location: [facility] CATH LAB;  Service: Cardiovascular;  Laterality: N/A;  . retina injection     Past Medical History:  Diagnosis Date  . Arthritis    hands  . CAD (coronary artery disease)    a. 2010: stents pRCA and dRCA. b. LHC 12/13/13: Minimal distal LM dz. LAD 95% ostial w/ L-->R collat. LCx mild dz. RCA 95% mid-dRCA. Patent stents proximal and distal RCA. EF not assessed, Nl by nuclear study.     . Central retinal vein occlusion, right eye   . Chronic kidney disease    hx kidney stone  . Depression   . Diplopia   . Ear pain    right  . Eosinophilic esophagitis   . Essential hypertension   . GERD (gastroesophageal reflux disease)   . H/O: whooping cough   . Hyperlipidemia with target LDL less than 70   . Incontinence    urine  . Stroke [facility]) 2001   reesultant Rt spastic hemplegia and aphasia    There were no vitals taken for this visit.  Opioid Risk Score:    Fall Risk Score:  `1  Depression screen PHQ 2/9  Depression screen [facility] 2/9 05/29/2020 10/21/2019 10/19/2019 07/20/2019 06/03/2019 04/08/2019 08/26/2017  Decreased Interest 0 0 - 0 0 0 0  Down, Depressed, Hopeless 0 0 2 0 0 0 0  PHQ - 2 Score 0 0 2 0 0 0 0  Altered sleeping - - 0 - - - -  Tired, decreased energy - - 1 - - - -  Change in appetite - - 0 - - - -  Feeling bad or failure about yourself  - - 0 - - - -  Trouble concentrating - - 1 - - - -  Moving slowly or fidgety/restless - - 1 - - - -  Suicidal thoughts - - 0 - - - -  PHQ-9 Score - - 5 - - - -   Review of Systems  Constitutional: Negative.   HENT: Negative.   Eyes: Negative.   Respiratory: Negative.   Cardiovascular: Negative.   Gastrointestinal: Negative.   Endocrine: Negative.   Genitourinary: Positive for urgency.  Musculoskeletal: Positive for gait problem.       LEFT SHOULDER PAIN LEFT FOOT PAIN  Skin:       TENDER SPOT BOTTOM OF LEFT FOOT  Allergic/Immunologic: Negative.   Neurological: Positive for speech difficulty and weakness.  Hematological: Negative.   Psychiatric/Behavioral:       DEPRESSION  ANXIETY       Objective:   Physical Exam Gen: no distress, normal appearing HEENT: oral mucosa pink and moist, NCAT Cardio: Reg rate Chest: normal effort, normal rate of breathing Abd: soft, non-distended Ext: no edema Psych: pleasant, normal affect Skin: intact Neuro:Patient communicates through short phrases and words as well as head nods. Primarily expressive aphasia; she understands very well and follows commands well. Right upper extremity with minimal voluntary movement. She has 2+ to 3 out of 5 hip flexor and knee extensor strength right lower extremity. Right ankle in splint with minimal active movement. Left upper extremity and left lower extremity grossly 4+ to 5 out of 5. No tenderness over left subacromial joint. Cervical myofascial trigger points have improved.  Musculoskeletal:ongoing  sublux.Minimal pain with right knee range of motion today, passive or active..Right hamstrings tight. Psych:Pt's affect is pleasant Assessment & Plan:  1. Hx of left MCA infarct with spastic right hemiparesis, expressive greater than receptive aphasia  2. .Primary osteoarthritis right knee 4. Insomnia, anxiety  5. Constipation 6. Bilateral shoulder pain related to chronic subluxation, mild adhesive capsulitis, chronic spasticity  7. OSA.  8. Left rotator cuff tendonitis/bursitis;biciptial tendonitis--generally improved. 9. Right rotator cuff tendonitis/right shoulder sublux 10.Right sided neuropathic pain 11. NON-healing left medial ankle wound. 12. S/p fall 13. Right sided spasticity   Plan:  1.Can continue daily ambulation and exercise at facility. Focus should be on mechanics and strengthening of the lower extremities, particularly the right side. Also needs to improve hamstring flexibility. Prescribed occupational therapy to maximize range of motion of left shoulder. Added physical therapy for strengthening of lower extremities.  2.Continue Gabapentin dose 363m TID for pain control. Does not need refill today.   3. Can stop right shoulder harness which is providing minimal benefit.  4. Corticosteroid injection appears to have improved pain as well as range of motion. Trigger points improved. Discussed applying the compounded cream there up to 4 times per day. No need to repeat at this time. Daughter will call me if repeated needed. 5. Left foot swelling resolved after foreign body removal.  6. Recommended blue emu oil 7. Discussed benefits of Tai Chi and [employer] Program- will let he know when running 8. No more falls.  9. Spasticity is very well controlled, continue stretching regimen.   All questions answered. RTC in 3 months.

## 2020-07-07 ENCOUNTER — Other Ambulatory Visit: Payer: Self-pay

## 2020-07-07 ENCOUNTER — Encounter (INDEPENDENT_AMBULATORY_CARE_PROVIDER_SITE_OTHER): Payer: Medicare PPO | Admitting: Ophthalmology

## 2020-07-07 DIAGNOSIS — H34811 Central retinal vein occlusion, right eye, with macular edema: Secondary | ICD-10-CM | POA: Diagnosis not present

## 2020-07-07 DIAGNOSIS — H43813 Vitreous degeneration, bilateral: Secondary | ICD-10-CM

## 2020-07-07 DIAGNOSIS — H35033 Hypertensive retinopathy, bilateral: Secondary | ICD-10-CM

## 2020-07-07 DIAGNOSIS — I10 Essential (primary) hypertension: Secondary | ICD-10-CM

## 2020-07-10 DIAGNOSIS — Z20828 Contact with and (suspected) exposure to other viral communicable diseases: Secondary | ICD-10-CM | POA: Diagnosis not present

## 2020-07-10 DIAGNOSIS — Z1159 Encounter for screening for other viral diseases: Secondary | ICD-10-CM | POA: Diagnosis not present

## 2020-07-17 DIAGNOSIS — Z20828 Contact with and (suspected) exposure to other viral communicable diseases: Secondary | ICD-10-CM | POA: Diagnosis not present

## 2020-07-17 DIAGNOSIS — Z1159 Encounter for screening for other viral diseases: Secondary | ICD-10-CM | POA: Diagnosis not present

## 2020-07-18 ENCOUNTER — Other Ambulatory Visit: Payer: Self-pay | Admitting: Internal Medicine

## 2020-07-18 NOTE — Telephone Encounter (Signed)
Patient was called to inform change of PCP before medication refill is approved. Voicemail was left with office call back

## 2020-07-24 DIAGNOSIS — Z20828 Contact with and (suspected) exposure to other viral communicable diseases: Secondary | ICD-10-CM | POA: Diagnosis not present

## 2020-07-24 DIAGNOSIS — Z1159 Encounter for screening for other viral diseases: Secondary | ICD-10-CM | POA: Diagnosis not present

## 2020-07-31 DIAGNOSIS — Z20828 Contact with and (suspected) exposure to other viral communicable diseases: Secondary | ICD-10-CM | POA: Diagnosis not present

## 2020-07-31 DIAGNOSIS — Z1159 Encounter for screening for other viral diseases: Secondary | ICD-10-CM | POA: Diagnosis not present

## 2020-08-03 ENCOUNTER — Encounter (INDEPENDENT_AMBULATORY_CARE_PROVIDER_SITE_OTHER): Payer: Medicare PPO | Admitting: Ophthalmology

## 2020-08-04 ENCOUNTER — Other Ambulatory Visit: Payer: Self-pay

## 2020-08-04 ENCOUNTER — Encounter (INDEPENDENT_AMBULATORY_CARE_PROVIDER_SITE_OTHER): Payer: Medicare PPO | Admitting: Ophthalmology

## 2020-08-04 DIAGNOSIS — I10 Essential (primary) hypertension: Secondary | ICD-10-CM | POA: Diagnosis not present

## 2020-08-04 DIAGNOSIS — H35033 Hypertensive retinopathy, bilateral: Secondary | ICD-10-CM | POA: Diagnosis not present

## 2020-08-04 DIAGNOSIS — H34811 Central retinal vein occlusion, right eye, with macular edema: Secondary | ICD-10-CM | POA: Diagnosis not present

## 2020-08-04 DIAGNOSIS — H43813 Vitreous degeneration, bilateral: Secondary | ICD-10-CM | POA: Diagnosis not present

## 2020-08-07 DIAGNOSIS — Z20828 Contact with and (suspected) exposure to other viral communicable diseases: Secondary | ICD-10-CM | POA: Diagnosis not present

## 2020-08-07 DIAGNOSIS — Z1159 Encounter for screening for other viral diseases: Secondary | ICD-10-CM | POA: Diagnosis not present

## 2020-08-14 DIAGNOSIS — Z1159 Encounter for screening for other viral diseases: Secondary | ICD-10-CM | POA: Diagnosis not present

## 2020-08-14 DIAGNOSIS — Z20828 Contact with and (suspected) exposure to other viral communicable diseases: Secondary | ICD-10-CM | POA: Diagnosis not present

## 2020-08-18 ENCOUNTER — Telehealth: Payer: Self-pay | Admitting: Nurse Practitioner

## 2020-08-18 NOTE — Telephone Encounter (Signed)
Called to schedule AWV. left message on stepdaughters vm to call back.

## 2020-09-06 ENCOUNTER — Encounter (INDEPENDENT_AMBULATORY_CARE_PROVIDER_SITE_OTHER): Payer: Medicare PPO | Admitting: Ophthalmology

## 2020-09-06 ENCOUNTER — Other Ambulatory Visit: Payer: Self-pay

## 2020-09-06 DIAGNOSIS — I10 Essential (primary) hypertension: Secondary | ICD-10-CM | POA: Diagnosis not present

## 2020-09-06 DIAGNOSIS — H34811 Central retinal vein occlusion, right eye, with macular edema: Secondary | ICD-10-CM | POA: Diagnosis not present

## 2020-09-06 DIAGNOSIS — H43813 Vitreous degeneration, bilateral: Secondary | ICD-10-CM | POA: Diagnosis not present

## 2020-09-06 DIAGNOSIS — H35033 Hypertensive retinopathy, bilateral: Secondary | ICD-10-CM

## 2020-09-22 ENCOUNTER — Other Ambulatory Visit: Payer: Self-pay | Admitting: Internal Medicine

## 2020-09-22 DIAGNOSIS — M792 Neuralgia and neuritis, unspecified: Secondary | ICD-10-CM

## 2020-09-22 DIAGNOSIS — K2100 Gastro-esophageal reflux disease with esophagitis, without bleeding: Secondary | ICD-10-CM

## 2020-09-22 DIAGNOSIS — G8110 Spastic hemiplegia affecting unspecified side: Secondary | ICD-10-CM

## 2020-09-22 MED ORDER — DULOXETINE HCL 20 MG PO CPEP: 20.0000 mg | ORAL_CAPSULE | Freq: Every day | ORAL | 3 refills | Status: DC

## 2020-09-22 MED ORDER — OMEPRAZOLE 20 MG PO CPDR: 1.0000 | DELAYED_RELEASE_CAPSULE | Freq: Every day | ORAL | 3 refills | Status: DC

## 2020-09-22 MED ORDER — CLOPIDOGREL BISULFATE 75 MG PO TABS: 75.0000 mg | ORAL_TABLET | Freq: Every day | ORAL | 3 refills | Status: DC

## 2020-10-03 ENCOUNTER — Other Ambulatory Visit: Payer: Self-pay | Admitting: Physical Medicine and Rehabilitation

## 2020-10-04 ENCOUNTER — Ambulatory Visit (INDEPENDENT_AMBULATORY_CARE_PROVIDER_SITE_OTHER): Payer: Medicare PPO | Admitting: Podiatry

## 2020-10-04 ENCOUNTER — Other Ambulatory Visit: Payer: Self-pay

## 2020-10-04 ENCOUNTER — Encounter: Payer: Self-pay | Admitting: Podiatry

## 2020-10-04 ENCOUNTER — Encounter (INDEPENDENT_AMBULATORY_CARE_PROVIDER_SITE_OTHER): Payer: Medicare PPO | Admitting: Ophthalmology

## 2020-10-04 DIAGNOSIS — G5791 Unspecified mononeuropathy of right lower limb: Secondary | ICD-10-CM | POA: Diagnosis not present

## 2020-10-04 DIAGNOSIS — I10 Essential (primary) hypertension: Secondary | ICD-10-CM

## 2020-10-04 DIAGNOSIS — B351 Tinea unguium: Secondary | ICD-10-CM | POA: Diagnosis not present

## 2020-10-04 DIAGNOSIS — L84 Corns and callosities: Secondary | ICD-10-CM

## 2020-10-04 DIAGNOSIS — H43813 Vitreous degeneration, bilateral: Secondary | ICD-10-CM

## 2020-10-04 DIAGNOSIS — H35033 Hypertensive retinopathy, bilateral: Secondary | ICD-10-CM | POA: Diagnosis not present

## 2020-10-04 DIAGNOSIS — M79674 Pain in right toe(s): Secondary | ICD-10-CM

## 2020-10-04 DIAGNOSIS — M79675 Pain in left toe(s): Secondary | ICD-10-CM | POA: Diagnosis not present

## 2020-10-04 DIAGNOSIS — H34811 Central retinal vein occlusion, right eye, with macular edema: Secondary | ICD-10-CM

## 2020-10-10 NOTE — Progress Notes (Signed)
  Subjective:  Patient ID: Kristin [name], female    DOB: 1940/11/13,  MRN: [id number]  Kristin [name] presents to clinic today for at risk foot care with history of peripheral neuropathy and corn(s) right 3rd digit and painful thick toenails that are difficult to trim. Painful toenails interfere with ambulation. Aggravating factors include wearing enclosed shoe gear. Pain is relieved with periodic professional debridement. Painful corns are aggravated when weightbearing when wearing enclosed shoe gear. Pain is relieved with periodic professional debridement.  Her daughter, Dr. Clarene Reamer, is present during today's visit. They voice no new pedal problems on today's visit.  PCP is Dr. Hollace Kinnier and last visit was 05/29/2020.  No Known Allergies  Review of Systems: Negative except as noted in the HPI. Objective:   Constitutional Kristin [name] is a pleasant 80 y.o. Caucasian female, WD, WN in NAD. AAO x 3.   Vascular Capillary refill time to digits immediate b/l. Palpable pedal pulses b/l LE. Pedal hair absent. Lower extremity skin temperature gradient within normal limits. No edema noted b/l lower extremities. No cyanosis or clubbing noted.  Neurologic Normal speech. Oriented to person, place, and time. Protective sensation intact with 10 gram monofilament left lower extremity. Protective sensation decreased with 10 gram monofilament right lower extremity.  Dermatologic Toenails 1-5 b/l elongated, discolored, dystrophic, thickened, crumbly with subungual debris and tenderness to dorsal palpation. Hyperkeratotic lesion(s) R 3rd toe.  No erythema, no edema, no drainage, no fluctuance.  Orthopedic: Muscle strength 5/5 to all LE muscle groups of left lower extremity. Dropfoot right lower extremity. Wearing AFO on right lower extremity. Clawtoe deformity 1-5 right.   Radiographs: None Assessment:   1. Pain due to onychomycosis of toenails of both feet   2. Corns   3. Neuropathy of right lower extremity     Plan:  Patient was evaluated and treated and all questions answered.  Onychomycosis with pain -Nails palliatively debridement as below -Educated on self-care  Procedure: Nail Debridement Rationale: Pain Type of Debridement: manual, sharp debridement. Instrumentation: Nail nipper, rotary burr. Number of Nails: 10 -Examined patient. -Patient to continue soft, supportive shoe gear daily. -Toenails 1-5 b/l were debrided in length and girth with sterile nail nippers and dremel without iatrogenic bleeding.  -Corn(s) R 3rd toe pared utilizing sterile scalpel blade without complication or incident. Total number debrided=1. -Patient to report any pedal injuries to medical professional immediately. -Patient/POA to call should there be question/concern in the interim.  Return in about 3 months (around 01/04/2021).  Marzetta Board, DPM

## 2020-10-25 ENCOUNTER — Ambulatory Visit: Payer: Medicare PPO | Admitting: Physical Medicine and Rehabilitation

## 2020-10-27 ENCOUNTER — Encounter: Payer: Self-pay | Admitting: Physical Medicine and Rehabilitation

## 2020-10-27 ENCOUNTER — Other Ambulatory Visit: Payer: Self-pay

## 2020-10-27 ENCOUNTER — Encounter: Payer: Medicare PPO | Attending: Physical Medicine and Rehabilitation | Admitting: Physical Medicine and Rehabilitation

## 2020-10-27 VITALS — BP 119/76 | HR 78 | Temp 97.9°F | Ht 66.0 in

## 2020-10-27 DIAGNOSIS — M1712 Unilateral primary osteoarthritis, left knee: Secondary | ICD-10-CM | POA: Insufficient documentation

## 2020-10-27 DIAGNOSIS — G8110 Spastic hemiplegia affecting unspecified side: Secondary | ICD-10-CM | POA: Insufficient documentation

## 2020-10-27 NOTE — Progress Notes (Signed)
Subjective:    Patient ID: Kristin [name], female    DOB: Feb 19, 1941, 80 y.o.   MRN: [id number]  HPI  Kristin [name] presents for follow-up of bilateral shoulder pain and right sided hemiplegia.  1) Bilateral shoulder pain -Pain has been stable -Does not feel she needs repeat shoulder injection -worse with activity -she has completed OT at her facility. Does not feel she needs repeat OT.  -she continues to take Gabapentin 319m TID  2) Fall -no more falls since last visit -she has completed PT -She continues to have difficulty getting in and out of the bed and difficulty flexing the right hip.  -She continues to sleep in the recliner.   3) right sided spasticity -much improved -continues daily stretching regimen  4) left knee pain -limiting her mobility -she is currently not doing any therapy or using any medication for this.   Prior history: Overall she is doing much better  Shoulder pain improved with OT and injection.  I am meeting with the patient today for follow-up of left MCA infarct and associated deficits.  [name] and is not as aware of her daily activity at her facility.   Her left shoulder pain and range of motion are much improved from corticosteroid injection. No need to repeat injection at this time.   Her pain in her right arm and leg is much improved since increasing her Gabapentin to 3043mTID. She continues on this dose. She does not need a refill today.  The patient demonstrated some of the stretches she is doing on her own. This does not make her drowsy.    She is falling asleep well at night but nocturia wakes her 3-4 times.   Urology has gone up up on DDAVP and this has helped  Podiatry removed foreign body in foot and pain has resolved.   Mood has been good.  She is not eating as well as usual since she has less communal dining events at her center. She is not involved in any physical therapy but tries to stretch out her hand herself.  She is able to ambulate within her room and use the rest room. She has a lot of joy in her life in living in the center. She loves listening to music.   Kristin [name] has continued outpatient follow-up with urology and continues on DDAVP and Myrbetriq for urinary incontinence. Her sleep continues to be disturbed as a result of this.   Cervical myofascial pain: For now, continue to apply compounded cream up to 4 times per day. Has improved with OT  Pain Inventory Average Pain 4  Pain Right Now 4 My pain is intermittent, sharp, dull, tingling, and aching  In the last 24 hours, has pain interfered with the following? General activity 7 Relation with others 2 Enjoyment of life 7 What TIME of day is your pain at its worst? night, daytime, evening, night Sleep (in general) Fair  Pain is worse with: walking and standing Pain improves with: medication Relief from Meds:  fair       Family History  Problem Relation Age of Onset   Diabetes Mother    Heart disease Mother    Heart attack Father    Breast cancer Sister    Social History   Socioeconomic History   Marital status: Married    Spouse name: Not on file   Number of children: 2   Years of education: Not on file   Highest education  level: Not on file  Occupational History   Occupation: retired Education officer, museum  Tobacco Use   Smoking status: Never   Smokeless tobacco: Never  Vaping Use   Vaping Use: Never used  Substance and Sexual Activity   Alcohol use: Yes    Alcohol/week: 0.0 standard drinks    Comment: glass of wine a day   Drug use: No   Sexual activity: Never  Other Topics Concern   Not on file  Social History Narrative   Not on file   Social Determinants of Health   Financial Resource Strain: Not on file  Food Insecurity: Not on file  Transportation Needs: Not on file  Physical Activity: Not on file  Stress: Not on file  Social Connections: Not on file   Past Surgical History:  Procedure Laterality Date    ABDOMINAL HYSTERECTOMY     CAROTID STENT     cataracts     COLONOSCOPY N/A 10/05/2015   Procedure: COLONOSCOPY;  Surgeon: Manus Gunning, MD;  Location: [facility];  Service: Gastroenterology;  Laterality: N/A;   CORONARY ARTERY BYPASS GRAFT N/A 12/17/2013   Procedure: CORONARY ARTERY BYPASS GRAFTING (CABG);  Surgeon: Gaye Pollack, MD;  Location: [place];  Service: Open Heart Surgery;  Laterality: N/A;  Times 2 using left internal mammary artery and endoscopically harvested right saphenous vein   ESOPHAGOGASTRODUODENOSCOPY     ESOPHAGOGASTRODUODENOSCOPY N/A 10/05/2015   Procedure: ESOPHAGOGASTRODUODENOSCOPY (EGD);  Surgeon: Manus Gunning, MD;  Location: [facility];  Service: Gastroenterology;  Laterality: N/A;   INTRAOPERATIVE TRANSESOPHAGEAL ECHOCARDIOGRAM N/A 12/17/2013   Procedure: INTRAOPERATIVE TRANSESOPHAGEAL ECHOCARDIOGRAM;  Surgeon: Gaye Pollack, MD;  Location: [facility] OR;  Service: Open Heart Surgery;  Laterality: N/A;   lasix     LEFT HEART CATHETERIZATION WITH CORONARY ANGIOGRAM N/A 12/13/2013   Procedure: LEFT HEART CATHETERIZATION WITH CORONARY ANGIOGRAM;  Surgeon: Jettie Booze, MD;  Location: [facility] CATH LAB;  Service: Cardiovascular;  Laterality: N/A;   retina injection     Past Medical History:  Diagnosis Date   Arthritis    hands   CAD (coronary artery disease)    a. 2010: stents pRCA and dRCA. b. LHC 12/13/13: Minimal distal LM dz. LAD 95% ostial w/ L-->R collat. LCx mild dz. RCA 95% mid-dRCA. Patent stents proximal and distal RCA. EF not assessed, Nl by nuclear study.      Central retinal vein occlusion, right eye    Chronic kidney disease    hx kidney stone   Depression    Diplopia    Ear pain    right   Eosinophilic esophagitis    Essential hypertension    GERD (gastroesophageal reflux disease)    H/O: whooping cough    Hyperlipidemia with target LDL less than 70    Incontinence    urine   Stroke ([place]) 2001   reesultant Rt spastic hemplegia  and aphasia    There were no vitals taken for this visit.  Opioid Risk Score:   Fall Risk Score:  `1  Depression screen PHQ 2/9  Depression screen [facility] 2/9 05/29/2020 10/21/2019 10/19/2019 07/20/2019 06/03/2019 04/08/2019 08/26/2017  Decreased Interest 0 0 - 0 0 0 0  Down, Depressed, Hopeless 0 0 2 0 0 0 0  PHQ - 2 Score 0 0 2 0 0 0 0  Altered sleeping - - 0 - - - -  Tired, decreased energy - - 1 - - - -  Change in appetite - - 0 - - - -  Feeling bad or failure about yourself  - - 0 - - - -  Trouble concentrating - - 1 - - - -  Moving slowly or fidgety/restless - - 1 - - - -  Suicidal thoughts - - 0 - - - -  PHQ-9 Score - - 5 - - - -   Review of Systems  Gastrointestinal: Negative.   Musculoskeletal:  Positive for gait problem.       Pain in left leg, left shoulder, & right knee  Neurological:  Positive for speech difficulty.       Objective:   Physical Exam Gen: no distress, normal appearing HEENT: oral mucosa pink and moist, NCAT Cardio: Reg rate Chest: normal effort, normal rate of breathing Abd: soft, non-distended Ext: no edema Psych: pleasant, normal affect Skin: intact Neuro: Patient communicates through short phrases and words as well as head nods.  Primarily expressive aphasia; she understands very well and follows commands well.  Right upper extremity with minimal voluntary movement.  She has 2+ to 3 out of 5 hip flexor and knee extensor strength right lower extremity.  Right ankle in splint with minimal active movement.  Left upper extremity and left lower extremity grossly 4+ to 5 out of 5. No tenderness over left subacromial joint. Cervical myofascial trigger points have improved.  Musculoskeletal: ongoing sublux.  Minimal pain with right knee range of motion today, passive or active.. Right hamstrings tight.  Psych: Pt's affect is pleasant Assessment & Plan:  1. Hx of left MCA infarct with spastic right hemiparesis, expressive greater than receptive aphasia  2.  Primary osteoarthritis of bilateral knees 4. Insomnia, anxiety  5. Constipation 6. Bilateral shoulder pain related to chronic subluxation, mild adhesive capsulitis, chronic spasticity  7. OSA.  8. Left rotator cuff tendonitis/bursitis;biciptial tendonitis --generally improved.  9. Right rotator cuff tendonitis/right shoulder sublux 10.  Right sided neuropathic pain 11. NON-healing left medial ankle wound.  12. S/p fall 13. Right sided spasticity     Plan:  1. Can continue daily ambulation and exercise at facility.  Focus should be on mechanics and strengthening of the lower extremities, particularly the right side.  Also needs to improve hamstring flexibility. Prescribed occupational therapy to maximize range of motion of left shoulder. Added physical therapy for strengthening of lower extremities.  2. Continue Gabapentin dose 31m TID for pain control. Does not need refill today.   3. Can stop right shoulder harness which is providing minimal benefit.  4. Corticosteroid injection appears to have improved pain as well as range of motion. Trigger points improved. Discussed applying the compounded cream there up to 4 times per day. No need to repeat at this time. Daughter will call me if repeated needed. 5. Left foot swelling resolved after foreign body removal.  6. Recommended blue emu oil 7. Discussed benefits of Tai Chi and Love Your [employer] Program- will let he know when running 8. No more falls.  9. Spasticity is very well controlled, continue stretching regimen.  10. Apply blue emu oil to knees and shoulders as needed 11. Prescribed PT for left sided quadriceps strengthening.   All questions answered. RTC in 3 months.

## 2020-11-01 ENCOUNTER — Encounter (INDEPENDENT_AMBULATORY_CARE_PROVIDER_SITE_OTHER): Payer: Medicare PPO | Admitting: Ophthalmology

## 2020-11-03 ENCOUNTER — Encounter (INDEPENDENT_AMBULATORY_CARE_PROVIDER_SITE_OTHER): Payer: Medicare PPO | Admitting: Ophthalmology

## 2020-11-03 ENCOUNTER — Other Ambulatory Visit: Payer: Self-pay

## 2020-11-03 DIAGNOSIS — I10 Essential (primary) hypertension: Secondary | ICD-10-CM | POA: Diagnosis not present

## 2020-11-03 DIAGNOSIS — H43813 Vitreous degeneration, bilateral: Secondary | ICD-10-CM

## 2020-11-03 DIAGNOSIS — H35033 Hypertensive retinopathy, bilateral: Secondary | ICD-10-CM

## 2020-11-03 DIAGNOSIS — H34811 Central retinal vein occlusion, right eye, with macular edema: Secondary | ICD-10-CM | POA: Diagnosis not present

## 2020-12-06 ENCOUNTER — Other Ambulatory Visit: Payer: Self-pay

## 2020-12-06 ENCOUNTER — Encounter (INDEPENDENT_AMBULATORY_CARE_PROVIDER_SITE_OTHER): Payer: Medicare PPO | Admitting: Ophthalmology

## 2020-12-06 DIAGNOSIS — H35033 Hypertensive retinopathy, bilateral: Secondary | ICD-10-CM

## 2020-12-06 DIAGNOSIS — I10 Essential (primary) hypertension: Secondary | ICD-10-CM

## 2020-12-06 DIAGNOSIS — H43813 Vitreous degeneration, bilateral: Secondary | ICD-10-CM

## 2020-12-06 DIAGNOSIS — H34811 Central retinal vein occlusion, right eye, with macular edema: Secondary | ICD-10-CM

## 2020-12-14 ENCOUNTER — Other Ambulatory Visit: Payer: Self-pay | Admitting: Physical Medicine and Rehabilitation

## 2020-12-21 ENCOUNTER — Other Ambulatory Visit: Payer: Self-pay

## 2020-12-21 MED ORDER — ATORVASTATIN CALCIUM 40 MG PO TABS: 40.0000 mg | ORAL_TABLET | Freq: Every day | ORAL | 0 refills | Status: DC

## 2021-01-03 ENCOUNTER — Other Ambulatory Visit: Payer: Self-pay

## 2021-01-03 ENCOUNTER — Encounter: Payer: Self-pay | Admitting: Podiatry

## 2021-01-03 ENCOUNTER — Ambulatory Visit: Payer: Medicare PPO | Admitting: Podiatry

## 2021-01-03 DIAGNOSIS — M79675 Pain in left toe(s): Secondary | ICD-10-CM | POA: Diagnosis not present

## 2021-01-03 DIAGNOSIS — B351 Tinea unguium: Secondary | ICD-10-CM | POA: Diagnosis not present

## 2021-01-03 DIAGNOSIS — M79674 Pain in right toe(s): Secondary | ICD-10-CM | POA: Diagnosis not present

## 2021-01-03 DIAGNOSIS — L84 Corns and callosities: Secondary | ICD-10-CM | POA: Diagnosis not present

## 2021-01-03 DIAGNOSIS — G5791 Unspecified mononeuropathy of right lower limb: Secondary | ICD-10-CM

## 2021-01-08 NOTE — Progress Notes (Signed)
Subjective: Kristin [name] is a 80 y.o. female patient seen today for follow up of at risk foot care with h/o neuropathy. She is seen for painful corn right 3rd toe and painful thick toenails that are difficult to trim. Pain interferes with ambulation. Aggravating factors include wearing enclosed shoe gear. Pain is relieved with periodic professional debridement.  Her daughter, Dr. Tor Netters,  is present on today's visit.  PCP is Lauree Chandler, NP.   New problems reported today: None.  No Known Allergies  PCP is Lauree Chandler, NP .  Objective: Physical Exam  General: Patient is a pleasant 80 y.o. Caucasian female WD, WN in NAD. AAO x 3.   Neurovascular Examination: Capillary refill time to digits immediate b/l. Palpable pedal pulses b/l LE. Pedal hair absent. Lower extremity skin temperature gradient within normal limits. No edema noted b/l lower extremities.   Protective sensation intact 5/5 intact bilaterally with 10g monofilament LLE.  Protective sensation decreased with 10 gram monofilament RLE.  Dermatological:  Skin warm and supple b/l lower extremities. No open wounds b/l lower extremities. No interdigital macerations b/l lower extremities. Toenails 1-5 b/l elongated, discolored, dystrophic, thickened, crumbly with subungual debris and tenderness to dorsal palpation. Hyperkeratotic lesion(s) R 3rd toe.  No erythema, no edema, no drainage, no fluctuance.  Musculoskeletal:  Muscle strength 5/5 to all LE muscle groups of left lower extremity. Dropfoot right lower extremity. Wearing AFO on right lower extremity.  Gross deformities: Clawtoe deformity 1-5 right.  Assessment: 1. Pain due to onychomycosis of toenails of both feet   2. Corns   3. Neuropathy of right lower extremity    Plan: Patient was evaluated and treated and all questions answered. Consent given for treatment as described below: -Examined patient. -Patient to continue soft, supportive shoe gear  daily. -Toenails 1-5 bilaterally were debrided in length and girth with sterile nail nippers and dremel. Pinpoint bleeding of R hallux addressed with Lumicain Hemostatic Solution, cleansed with alcohol. triple antibiotic ointment applied. Patient instructed to apply Neosporin once daily for 7 days. -Patient to report any pedal injuries to medical professional immediately. -Continue digital toe cap to afftected digits daily for protection. -Patient/POA to call should there be question/concern in the interim.  Return in about 3 months (around 04/04/2021).  Marzetta Board, DPM

## 2021-01-10 ENCOUNTER — Encounter (INDEPENDENT_AMBULATORY_CARE_PROVIDER_SITE_OTHER): Payer: Medicare PPO | Admitting: Ophthalmology

## 2021-01-10 ENCOUNTER — Other Ambulatory Visit: Payer: Self-pay

## 2021-01-10 DIAGNOSIS — H35033 Hypertensive retinopathy, bilateral: Secondary | ICD-10-CM | POA: Diagnosis not present

## 2021-01-10 DIAGNOSIS — H34811 Central retinal vein occlusion, right eye, with macular edema: Secondary | ICD-10-CM

## 2021-01-10 DIAGNOSIS — H43813 Vitreous degeneration, bilateral: Secondary | ICD-10-CM

## 2021-01-10 DIAGNOSIS — I10 Essential (primary) hypertension: Secondary | ICD-10-CM

## 2021-02-02 ENCOUNTER — Ambulatory Visit: Payer: Medicare PPO | Admitting: Nurse Practitioner

## 2021-02-13 ENCOUNTER — Other Ambulatory Visit: Payer: Self-pay | Admitting: Physical Medicine and Rehabilitation

## 2021-02-13 ENCOUNTER — Encounter (INDEPENDENT_AMBULATORY_CARE_PROVIDER_SITE_OTHER): Payer: Medicare PPO | Admitting: Ophthalmology

## 2021-02-13 ENCOUNTER — Other Ambulatory Visit: Payer: Self-pay

## 2021-02-13 DIAGNOSIS — I10 Essential (primary) hypertension: Secondary | ICD-10-CM | POA: Diagnosis not present

## 2021-02-13 DIAGNOSIS — H35033 Hypertensive retinopathy, bilateral: Secondary | ICD-10-CM | POA: Diagnosis not present

## 2021-02-13 DIAGNOSIS — H34811 Central retinal vein occlusion, right eye, with macular edema: Secondary | ICD-10-CM

## 2021-02-13 DIAGNOSIS — H43813 Vitreous degeneration, bilateral: Secondary | ICD-10-CM

## 2021-02-22 ENCOUNTER — Emergency Department (HOSPITAL_COMMUNITY)
Admission: EM | Admit: 2021-02-22 | Discharge: 2021-02-23 | Disposition: A | Discharge status: Home / Self Care | Payer: Medicare PPO | Attending: Emergency Medicine | Admitting: Emergency Medicine

## 2021-02-22 ENCOUNTER — Emergency Department (HOSPITAL_COMMUNITY): Payer: Medicare PPO

## 2021-02-22 DIAGNOSIS — I129 Hypertensive chronic kidney disease with stage 1 through stage 4 chronic kidney disease, or unspecified chronic kidney disease: Secondary | ICD-10-CM | POA: Insufficient documentation

## 2021-02-22 DIAGNOSIS — Z955 Presence of coronary angioplasty implant and graft: Secondary | ICD-10-CM | POA: Diagnosis not present

## 2021-02-22 DIAGNOSIS — N189 Chronic kidney disease, unspecified: Secondary | ICD-10-CM | POA: Insufficient documentation

## 2021-02-22 DIAGNOSIS — R531 Weakness: Secondary | ICD-10-CM | POA: Insufficient documentation

## 2021-02-22 DIAGNOSIS — M79601 Pain in right arm: Secondary | ICD-10-CM | POA: Diagnosis not present

## 2021-02-22 DIAGNOSIS — I2510 Atherosclerotic heart disease of native coronary artery without angina pectoris: Secondary | ICD-10-CM | POA: Insufficient documentation

## 2021-02-22 DIAGNOSIS — M79604 Pain in right leg: Secondary | ICD-10-CM | POA: Diagnosis present

## 2021-02-22 LAB — CBC WITH DIFFERENTIAL/PLATELET
Abs Immature Granulocytes: 0.02 10*3/uL (ref 0.00–0.07)
Basophils Absolute: 0 10*3/uL (ref 0.0–0.1)
Basophils Relative: 0 %
Eosinophils Absolute: 0.3 10*3/uL (ref 0.0–0.5)
Eosinophils Relative: 5 %
HCT: 43.8 % (ref 36.0–46.0)
Hemoglobin: 14.5 g/dL (ref 12.0–15.0)
Immature Granulocytes: 0 %
Lymphocytes Relative: 21 %
Lymphs Abs: 1.3 10*3/uL (ref 0.7–4.0)
MCH: 30.1 pg (ref 26.0–34.0)
MCHC: 33.1 g/dL (ref 30.0–36.0)
MCV: 91.1 fL (ref 80.0–100.0)
Monocytes Absolute: 0.6 10*3/uL (ref 0.1–1.0)
Monocytes Relative: 11 %
Neutro Abs: 3.8 10*3/uL (ref 1.7–7.7)
Neutrophils Relative %: 63 %
Platelets: 258 10*3/uL (ref 150–400)
RBC: 4.81 MIL/uL (ref 3.87–5.11)
RDW: 12.5 % (ref 11.5–15.5)
WBC: 6 10*3/uL (ref 4.0–10.5)
nRBC: 0 % (ref 0.0–0.2)

## 2021-02-22 LAB — BASIC METABOLIC PANEL
Anion gap: 9 (ref 5–15)
BUN: 18 mg/dL (ref 8–23)
CO2: 26 mmol/L (ref 22–32)
Calcium: 9.3 mg/dL (ref 8.9–10.3)
Chloride: 104 mmol/L (ref 98–111)
Creatinine, Ser: 0.61 mg/dL (ref 0.44–1.00)
GFR, Estimated: >60 mL/min (ref >60)
Glucose, Bld: 119 mg/dL — ABNORMAL HIGH (ref 70–99)
Potassium: 3.8 mmol/L (ref 3.5–5.1)
Sodium: 139 mmol/L (ref 135–145)

## 2021-02-22 NOTE — ED Triage Notes (Signed)
Pt from [facility] c/o R arm, leg pain preventing standing; pt usually ambulatory w walker. Hx stroke 2001 w R sided deficits & speech (severe stutter), at baseline.   VSS

## 2021-02-22 NOTE — ED Provider Notes (Signed)
Emergency Medicine Provider Triage Evaluation Note  Kristin [name] , a 80 y.o. female  was evaluated in triage.  Pt complains of right arm pain and right leg pain.  She has hx of strokes.  Family reports that she hasn't be able to ambulate today.  Unclear if this is due to pain or new weakness, but family suspects because of pain.  Patient has significant aphasia and can't contribute much to her history.  Review of Systems  Positive: Right sided pain/weakness Negative: Fever, chills  Physical Exam  BP 117/86 (BP Location: Left Arm)   Pulse 80   Temp 97.8 F (36.6 C) (Oral)   Resp 14   SpO2 94%  Gen:   Awake, no distress   Resp:  Normal effort  MSK:   R sided upper deficits  Other:    Medical Decision Making  Medically screening exam initiated at 10:28 PM.  Appropriate orders placed.  Kristin [name] was informed that the remainder of the evaluation will be completed by another provider, this initial triage assessment does not replace that evaluation, and the importance of remaining in the ED until their evaluation is complete.     Montine Circle, PA-C 02/22/21 Cortez, [place], MD 02/23/21 [phone]

## 2021-02-23 ENCOUNTER — Other Ambulatory Visit: Payer: Self-pay

## 2021-02-23 MED ORDER — ACETAMINOPHEN 500 MG PO TABS
1000.0000 mg | ORAL_TABLET | Freq: Once | ORAL | Status: AC
  Administered 2021-02-23: 1000 mg via ORAL
  Filled 2021-02-23: qty 2

## 2021-02-23 MED ORDER — METHOCARBAMOL 500 MG PO TABS
1000.0000 mg | ORAL_TABLET | Freq: Once | ORAL | Status: AC
  Administered 2021-02-23: 1000 mg via ORAL
  Filled 2021-02-23: qty 2

## 2021-02-23 MED ORDER — METHOCARBAMOL 500 MG PO TABS: 500.0000 mg | ORAL_TABLET | Freq: Three times a day (TID) | ORAL | 0 refills | Status: DC | PRN

## 2021-02-23 NOTE — Discharge Instructions (Signed)
You were evaluated in the Emergency Department and after careful evaluation, we did not find any emergent condition requiring admission or further testing in the hospital.  Your exam/testing today was overall reassuring.  Symptoms could be due to muscular strain or spasm or neuropathic pain related to your prior stroke.  Recommend close follow-up with your regular doctors.  Ask them about your gabapentin dose.  You can try the Robaxin muscle relaxer medicine as needed for pain.  Continue Tylenol every 4-6 hours.  Please return to the Emergency Department if you experience any worsening of your condition.  Thank you for allowing [place] to be a part of your care.

## 2021-02-23 NOTE — ED Provider Notes (Signed)
[facility] Emergency Department Provider Note MRN:  [id number]  Arrival date & time: 02/23/21     Chief Complaint   Leg Pain   History of Present Illness   Kristin [name] is a 80 y.o. year-old female with a history of stroke presenting to the ED with chief complaint of leg pain.  Pain in the right arm and right leg intermittently for the past several days.  Currently moderate to severe.  Patient is aphasic from prior stroke and history obtained from daughter at bedside.  There has been no rash or redness to the arm or leg, this is the same side where she has chronic stroke deficits and increased tone.  No recent falls, no fever.  Facility was concerned today because she was not able to ambulate like normal, sent here for evaluation.  Daughter denies any new neurological symptoms at this time.  I was unable to obtain an accurate HPI, PMH, or ROS due to the patient's aphasia.  Level 5 caveat.  Review of Systems  Positive for right arm and right leg pain  Patient's Health History    Past Medical History:  Diagnosis Date   Arthritis    hands   CAD (coronary artery disease)    a. 2010: stents pRCA and dRCA. b. LHC 12/13/13: Minimal distal LM dz. LAD 95% ostial w/ L-->R collat. LCx mild dz. RCA 95% mid-dRCA. Patent stents proximal and distal RCA. EF not assessed, Nl by nuclear study.      Central retinal vein occlusion, right eye    Chronic kidney disease    hx kidney stone   Depression    Diplopia    Ear pain    right   Eosinophilic esophagitis    Essential hypertension    GERD (gastroesophageal reflux disease)    H/O: whooping cough    Hyperlipidemia with target LDL less than 70    Incontinence    urine   Stroke ([place]) 2001   reesultant Rt spastic hemplegia and aphasia     Past Surgical History:  Procedure Laterality Date   ABDOMINAL HYSTERECTOMY     CAROTID STENT     cataracts     COLONOSCOPY N/A 10/05/2015   Procedure: COLONOSCOPY;  Surgeon: Manus Gunning, MD;  Location: [facility];  Service: Gastroenterology;  Laterality: N/A;   CORONARY ARTERY BYPASS GRAFT N/A 12/17/2013   Procedure: CORONARY ARTERY BYPASS GRAFTING (CABG);  Surgeon: Gaye Pollack, MD;  Location: [place];  Service: Open Heart Surgery;  Laterality: N/A;  Times 2 using left internal mammary artery and endoscopically harvested right saphenous vein   ESOPHAGOGASTRODUODENOSCOPY     ESOPHAGOGASTRODUODENOSCOPY N/A 10/05/2015   Procedure: ESOPHAGOGASTRODUODENOSCOPY (EGD);  Surgeon: Manus Gunning, MD;  Location: [facility];  Service: Gastroenterology;  Laterality: N/A;   INTRAOPERATIVE TRANSESOPHAGEAL ECHOCARDIOGRAM N/A 12/17/2013   Procedure: INTRAOPERATIVE TRANSESOPHAGEAL ECHOCARDIOGRAM;  Surgeon: Gaye Pollack, MD;  Location: [facility] OR;  Service: Open Heart Surgery;  Laterality: N/A;   lasix     LEFT HEART CATHETERIZATION WITH CORONARY ANGIOGRAM N/A 12/13/2013   Procedure: LEFT HEART CATHETERIZATION WITH CORONARY ANGIOGRAM;  Surgeon: Jettie Booze, MD;  Location: [facility] CATH LAB;  Service: Cardiovascular;  Laterality: N/A;   retina injection      Family History  Problem Relation Age of Onset   Diabetes Mother    Heart disease Mother    Heart attack Father    Breast cancer Sister     Social History   Socioeconomic History  Marital status: Married    Spouse name: Not on file   Number of children: 2   Years of education: Not on file   Highest education level: Not on file  Occupational History   Occupation: retired Education officer, museum  Tobacco Use   Smoking status: Never   Smokeless tobacco: Never  Vaping Use   Vaping Use: Never used  Substance and Sexual Activity   Alcohol use: Yes    Alcohol/week: 0.0 standard drinks    Comment: glass of wine a day   Drug use: No   Sexual activity: Never  Other Topics Concern   Not on file  Social History Narrative   Not on file   Social Determinants of Health   Financial Resource Strain: Not on file  Food  Insecurity: Not on file  Transportation Needs: Not on file  Physical Activity: Not on file  Stress: Not on file  Social Connections: Not on file  Intimate Partner Violence: Not on file     Physical Exam   Vitals:   02/23/21 0134 02/23/21 0214  BP: (!) 177/92 (!) 165/83  Pulse: 62 70  Resp: 17 16  Temp:    SpO2: 100% 100%    CONSTITUTIONAL: Chronically ill-appearing, NAD NEURO:  Alert and interactive, densely aphasic, right hemiplegia EYES:  eyes equal and reactive ENT/NECK:  no LAD, no JVD CARDIO: Regular rate, well-perfused, normal S1 and S2 PULM:  CTAB no wheezing or rhonchi GI/GU:  normal bowel sounds, non-distended, non-tender MSK/SPINE:  No gross deformities, no edema SKIN:  no rash, atraumatic PSYCH:  Appropriate speech and behavior  *Additional and/or pertinent findings included in MDM below  Diagnostic and Interventional Summary    EKG Interpretation  Date/Time:    Ventricular Rate:    PR Interval:    QRS Duration:   QT Interval:    QTC Calculation:   R Axis:     Text Interpretation:         Labs Reviewed  BASIC METABOLIC PANEL - Abnormal; Notable for the following components:      Result Value   Glucose, Bld 119 (*)    All other components within normal limits  CBC WITH DIFFERENTIAL/PLATELET  URINALYSIS, ROUTINE W REFLEX MICROSCOPIC    CT HEAD WO CONTRAST (5MM)  Final Result    CT Cervical Spine Wo Contrast  Final Result    CT Lumbar Spine Wo Contrast  Final Result      Medications  methocarbamol (ROBAXIN) tablet 1,000 mg (1,000 mg Oral Given 02/23/21 0216)  acetaminophen (TYLENOL) tablet 1,000 mg (1,000 mg Oral Given 02/23/21 0215)     Procedures  /  Critical Care Procedures  ED Course and Medical Decision Making  I have reviewed the triage vital signs, the nursing notes, and pertinent available records from the EMR.  Listed above are laboratory and imaging tests that I personally ordered, reviewed, and interpreted and then  considered in my medical decision making (see below for details).  The right arm and leg have significantly increased tone, she does have hemiplegia on the side, but I do not see any external abnormalities, no redness, no increased warmth, no rash.  There is no significant increase in pain with range of motion of the extremities or the joints, which makes me favor more of a neuropathic pain rather than a muscular pain.  She is already on gabapentin.  We will trial Robaxin and Tylenol here in the emergency department.  Screening labs and CT scans did not show  any acute changes.  Discussed management options with daughter and given the trouble ambulating, we discussed that this is most likely pain related but we could obtain MRI to exclude new or worsening stroke.  It was felt best at this time to treat pain and keep a close eye on her symptoms and MRI could be obtained as an outpatient if things were to worsen.  This seems most appropriate as concern for new acute stroke is very low.     Seems more comfortable after Tylenol and Robaxin, appropriate for discharge with close follow-up.  Barth Kirks. Sedonia Small, [place] [email or web address]  Final Clinical Impressions(s) / ED Diagnoses     ICD-10-CM   1. Right leg pain  M79.604     2. Right arm pain  M79.601       ED Discharge Orders          Ordered    methocarbamol (ROBAXIN) 500 MG tablet  Every 8 hours PRN        02/23/21 0259             Discharge Instructions Discussed with and Provided to Patient:     Discharge Instructions      You were evaluated in the Emergency Department and after careful evaluation, we did not find any emergent condition requiring admission or further testing in the hospital.  Your exam/testing today was overall reassuring.  Symptoms could be due to muscular strain or spasm or neuropathic pain related to your prior stroke.  Recommend close follow-up with  your regular doctors.  Ask them about your gabapentin dose.  You can try the Robaxin muscle relaxer medicine as needed for pain.  Continue Tylenol every 4-6 hours.  Please return to the Emergency Department if you experience any worsening of your condition.  Thank you for allowing [place] to be a part of your care.         Maudie Flakes, MD 02/23/21 0300

## 2021-02-23 NOTE — ED Notes (Signed)
Here due to pain in rt arm and leg for the last several weeks. Progressively worse today and difficulty ambulating more than usual, and the hemiparalysis on the rt side due to previous CVA and difficulty with speech from previous CVA

## 2021-03-02 ENCOUNTER — Ambulatory Visit: Payer: Medicare PPO | Admitting: Nurse Practitioner

## 2021-03-02 ENCOUNTER — Encounter: Payer: Self-pay | Admitting: Nurse Practitioner

## 2021-03-02 ENCOUNTER — Other Ambulatory Visit: Payer: Self-pay

## 2021-03-02 VITALS — BP 140/80 | HR 83 | Temp 97.7°F | Ht 67.0 in | Wt 191.0 lb

## 2021-03-02 DIAGNOSIS — I69351 Hemiplegia and hemiparesis following cerebral infarction affecting right dominant side: Secondary | ICD-10-CM

## 2021-03-02 DIAGNOSIS — K219 Gastro-esophageal reflux disease without esophagitis: Secondary | ICD-10-CM

## 2021-03-02 DIAGNOSIS — I10 Essential (primary) hypertension: Secondary | ICD-10-CM

## 2021-03-02 DIAGNOSIS — N390 Urinary tract infection, site not specified: Secondary | ICD-10-CM

## 2021-03-02 DIAGNOSIS — I69320 Aphasia following cerebral infarction: Secondary | ICD-10-CM | POA: Diagnosis not present

## 2021-03-02 DIAGNOSIS — T148XXA Other injury of unspecified body region, initial encounter: Secondary | ICD-10-CM

## 2021-03-02 DIAGNOSIS — R350 Frequency of micturition: Secondary | ICD-10-CM

## 2021-03-02 DIAGNOSIS — E782 Mixed hyperlipidemia: Secondary | ICD-10-CM

## 2021-03-02 DIAGNOSIS — R635 Abnormal weight gain: Secondary | ICD-10-CM

## 2021-03-02 DIAGNOSIS — M792 Neuralgia and neuritis, unspecified: Secondary | ICD-10-CM

## 2021-03-02 MED ORDER — METHOCARBAMOL 500 MG PO TABS: 500.0000 mg | ORAL_TABLET | Freq: Three times a day (TID) | ORAL | 0 refills | Status: DC | PRN

## 2021-03-02 NOTE — Progress Notes (Signed)
Careteam: Patient Care Team: Lauree Chandler, NP as PCP - General (Geriatric Medicine) Armbruster, Carlota Raspberry, MD as Consulting Physician (Gastroenterology) Meredith Staggers, MD as Consulting Physician (Physical Medicine and Rehabilitation) Register, Luetta Nutting, PA-C as Physician Assistant (Dermatology) Marzetta Board, DPM as Consulting Physician (Podiatry) Hayden Pedro, MD as Consulting Physician (Ophthalmology) Domingo Pulse, MD (Urology)  PLACE OF SERVICE:  [place] Directive information Does Patient Have a Medical Advance Directive?: Yes, Type of Advance Directive: Out of facility DNR (pink MOST or yellow form), Pre-existing out of facility DNR order (yellow form or pink MOST form): Pink MOST form placed in chart (order not valid for inpatient use), Does patient want to make changes to medical advance directive?: No - Patient declined  No Known Allergies  Chief Complaint  Patient presents with   Medical Management of Chronic Issues    Patient reestablishing care.Patient has been having pain in right leg. Patient received flu vaccine at [facility].Patient in office with Duahgter, [name] today.Would like to update MOST form today.   Health Maintenance    Zoster vaccine,Pneumonia vaccine, 2nd COVID booster,Flu vaccine     HPI: Patient is a 80 y.o. female for routine follow up.  Pt previously Dr Mariea Clonts.  Pt lives in [facility] and requiring more assitance with ADLs trying to decide if she needs to move to assisted living. Planning to meet with therapist as well.  Pain interfering with ADLS as well.  COVID last month and required her to be restricted to her room.  She is able to do her own bathing but gets helps.  Intermediated right sided pain- goes away and then has come back.  Had a full work up in the ED and everything looked okay and could not figure out the cause of the pain- hoping it leaves  Good ROM in hip and leg.  Very alarming for her when  it comes on and causes a great deal of distress.  All on right side which was effected for her CVA. ED doctor thought this may be an effect from CVA.  Pain is sharp and achy.   Weight has gone up- she eats well. 3 meals a day. Eats dessert twice daily.   No chest pains noted.   GERD- controlled on omeprazole- also takes due to plavix and asa    Had been walking with walker until recently. Pain and free of falling has limited walking.  PT starting.   Noted to have large bruise to right medial upper leg. No injury or fall noted. Denies pain to hip/leg area. Bruising has some yellowing to upper area. Does not generally bruise easily.   Review of Systems:  Review of Systems  Constitutional:  Negative for chills, fever and weight loss.  HENT:  Negative for tinnitus.   Respiratory:  Negative for cough, sputum production and shortness of breath.   Cardiovascular:  Negative for chest pain, palpitations and leg swelling.  Gastrointestinal:  Negative for abdominal pain, constipation, diarrhea and heartburn.  Genitourinary:  Positive for frequency. Negative for dysuria and urgency.  Musculoskeletal:  Positive for falls, joint pain and myalgias. Negative for back pain.  Skin: Negative.   Neurological:  Positive for speech change and weakness. Negative for dizziness and headaches.  Psychiatric/Behavioral:  Negative for depression and memory loss. The patient does not have insomnia.    Past Medical History:  Diagnosis Date   Arthritis    hands   CAD (coronary artery disease)  a. 2010: stents pRCA and dRCA. b. LHC 12/13/13: Minimal distal LM dz. LAD 95% ostial w/ L-->R collat. LCx mild dz. RCA 95% mid-dRCA. Patent stents proximal and distal RCA. EF not assessed, Nl by nuclear study.      Central retinal vein occlusion, right eye    Chronic kidney disease    hx kidney stone   Depression    Diplopia    Ear pain    right   Eosinophilic esophagitis    Essential hypertension    GERD  (gastroesophageal reflux disease)    H/O: whooping cough    Hyperlipidemia with target LDL less than 70    Incontinence    urine   Stroke ([place]) 2001   reesultant Rt spastic hemplegia and aphasia    Past Surgical History:  Procedure Laterality Date   ABDOMINAL HYSTERECTOMY     CAROTID STENT     cataracts     COLONOSCOPY N/A 10/05/2015   Procedure: COLONOSCOPY;  Surgeon: Manus Gunning, MD;  Location: [facility];  Service: Gastroenterology;  Laterality: N/A;   CORONARY ARTERY BYPASS GRAFT N/A 12/17/2013   Procedure: CORONARY ARTERY BYPASS GRAFTING (CABG);  Surgeon: Gaye Pollack, MD;  Location: [facility];  Service: Open Heart Surgery;  Laterality: N/A;  Times 2 using left internal mammary artery and endoscopically harvested right saphenous vein   ESOPHAGOGASTRODUODENOSCOPY     ESOPHAGOGASTRODUODENOSCOPY N/A 10/05/2015   Procedure: ESOPHAGOGASTRODUODENOSCOPY (EGD);  Surgeon: Manus Gunning, MD;  Location: [facility];  Service: Gastroenterology;  Laterality: N/A;   INTRAOPERATIVE TRANSESOPHAGEAL ECHOCARDIOGRAM N/A 12/17/2013   Procedure: INTRAOPERATIVE TRANSESOPHAGEAL ECHOCARDIOGRAM;  Surgeon: Gaye Pollack, MD;  Location[facility];  Service: Open Heart Surgery;  Laterality: N/A;   lasix     LEFT HEART CATHETERIZATION WITH CORONARY ANGIOGRAM N/A 12/13/2013   Procedure: LEFT HEART CATHETERIZATION WITH CORONARY ANGIOGRAM;  Surgeon: Jettie Booze, MD;  Location: [facility] CATH LAB;  Service: Cardiovascular;  Laterality: N/A;   retina injection     Social History:   reports that she has never smoked. She has never used smokeless tobacco. She reports current alcohol use. She reports that she does not use drugs.  Family History  Problem Relation Age of Onset   Diabetes Mother    Heart disease Mother    Heart attack Father    Breast cancer Sister     Medications: Patient's Medications  New Prescriptions   No medications on file  Previous Medications   ASPIRIN 81 MG TABLET     Take 81 mg by mouth daily.   ATORVASTATIN (LIPITOR) 40 MG TABLET    Take 1 tablet (40 mg total) by mouth daily.   CLOPIDOGREL (PLAVIX) 75 MG TABLET    Take 1 tablet (75 mg total) by mouth daily.   COENZYME Q10 (COQ-10 PO)    Take 1 tablet daily by mouth.   DESMOPRESSIN (DDAVP) 0.2 MG TABLET    Take 0.2 mg by mouth at bedtime. 2 tablets at bedtime   DICLOFENAC SODIUM (VOLTAREN) 1 % GEL    APPLY TO THE AFFECTED AREA(S) THREE TIMES DAILY.   DULOXETINE (CYMBALTA) 20 MG CAPSULE    Take 1 capsule (20 mg total) by mouth daily.   ESTRADIOL (ESTRACE) 0.1 MG/GM VAGINAL CREAM       GABAPENTIN (NEURONTIN) 300 MG CAPSULE    TAKE ONE CAPSULE BY MOUTH THREE TIMES DAILY   METHOCARBAMOL (ROBAXIN) 500 MG TABLET    Take 1 tablet (500 mg total) by mouth every 8 (eight)  hours as needed for muscle spasms.   MIRABEGRON ER (MYRBETRIQ) 50 MG TB24 TABLET    Take 1 tablet by mouth daily.   NITROGLYCERIN (NITROSTAT) 0.4 MG SL TABLET    Place 1 tablet (0.4 mg total) under the tongue every 5 (five) minutes as needed for chest pain.   OMEPRAZOLE (PRILOSEC) 20 MG CAPSULE    Take 1 capsule (20 mg total) by mouth daily.   TRIMETHOPRIM (TRIMPEX) 100 MG TABLET    Take 1 tablet by mouth daily.   TURMERIC PO    Take 1 tablet by mouth daily.   Modified Medications   No medications on file  Discontinued Medications   FLUTICASONE (CUTIVATE) 0.05 % CREAM       KETOCONAZOLE (NIZORAL) 2 % CREAM        Physical Exam:  Vitals:   03/02/21 1139  BP: 140/80  Pulse: 83  Temp: 97.7 F (36.5 C)  TempSrc: Temporal  SpO2: 96%  Weight: 191 lb (86.6 kg)  Height: 5\' 7"  (1.702 m)   Body mass index is 29.91 kg/m. Wt Readings from Last 3 Encounters:  03/02/21 191 lb (86.6 kg)  02/23/21 170 lb (77.1 kg)  05/29/20 161 lb (73 kg)    Physical Exam Constitutional:      General: She is not in acute distress.    Appearance: She is well-developed. She is not diaphoretic.  HENT:     Head: Normocephalic and atraumatic.     Mouth/Throat:      Pharynx: No oropharyngeal exudate.  Eyes:     Conjunctiva/sclera: Conjunctivae normal.     Pupils: Pupils are equal, round, and reactive to light.  Cardiovascular:     Rate and Rhythm: Normal rate and regular rhythm.     Heart sounds: Normal heart sounds.  Pulmonary:     Effort: Pulmonary effort is normal.     Breath sounds: Normal breath sounds.  Abdominal:     General: Bowel sounds are normal.     Palpations: Abdomen is soft.  Musculoskeletal:     Cervical back: Normal range of motion and neck supple.     Right lower leg: No edema.     Left lower leg: No edema.  Skin:    General: Skin is warm and dry.     Findings: Bruising (large bruise to right medial upper leg) present.  Neurological:     Mental Status: She is alert.     Gait: Gait abnormal.     Comments: Chronic right spastic hemiparesis, foot drop, dysarthria, aphasia  Psychiatric:        Mood and Affect: Mood normal.    Labs reviewed: Basic Metabolic Panel: Recent Labs    02/22/21 2234  NA 139  K 3.8  CL 104  CO2 26  GLUCOSE 119*  BUN 18  CREATININE 0.61  CALCIUM 9.3   Liver Function Tests: No results for input(s): AST, ALT, ALKPHOS, BILITOT, PROT, ALBUMIN in the last 8760 hours. No results for input(s): LIPASE, AMYLASE in the last 8760 hours. No results for input(s): AMMONIA in the last 8760 hours. CBC: Recent Labs    02/22/21 2234  WBC 6.0  NEUTROABS 3.8  HGB 14.5  HCT 43.8  MCV 91.1  PLT 258   Lipid Panel: No results for input(s): CHOL, HDL, LDLCALC, TRIG, CHOLHDL, LDLDIRECT in the last 8760 hours. TSH: No results for input(s): TSH in the last 8760 hours. A1C: Lab Results  Component Value Date   HGBA1C 5.4 06/02/2018  Assessment/Plan 1. Aphasia as late effect of stroke -stable, stepdaughter interprets.   2. Essential hypertension -Blood pressure controlled Continue current medications Recheck metabolic panel  3. Gastroesophageal reflux disease, unspecified whether  esophagitis present -stable.   4. Neuropathic pain -continues on cymbalta and gabapentin TID  5. Spastic hemiplegia of right dominant side as late effect of cerebral infarction (HCC) -chronic, ?increase in pain coming from late effects of CVA. Continues on gabapentin with robaxin.  Continues with PT and caregiver support at facility.  May need higher level of care which stepdaughter is looking into.  - methocarbamol (ROBAXIN) 500 MG tablet; Take 1 tablet (500 mg total) by mouth every 8 (eight) hours as needed for muscle spasms.  Dispense: 30 tablet; Refill: 0 - CBC with Differential/Platelet  6. Weight gain -noted however she had lost weight during COVID when they were eating in their rooms, weight of 174 lbs in 2018, there was no weight on file until 03/2019 which was 152 lbs, now dinning room has opened and she is eating much better.  Will get TSH to make sure thyroid in normal limits.  - TSH  7. Mixed hyperlipidemia -LDL goal <70, will follow up labs at this time. Continues on lipitor 40 mg daily - Lipid Panel - Hepatic Function Panel  8. Recurrent UTI Stable, no recent UTI, Followed by urology, continues on estradiol cream and trimethoprim daily   9. Urinary frequency Stable, followed by urology, continues on myrbetriq  10. Large bruise No signs of pain, no injury noted -will follow up CBC    Next appt: 6 months.  Carlos American. [place], [facility] [phone]

## 2021-03-02 NOTE — Patient Instructions (Signed)
To schedule AWV

## 2021-03-03 LAB — CBC WITH DIFFERENTIAL/PLATELET
Absolute Monocytes: 470 cells/uL (ref 200–950)
Basophils Absolute: 50 cells/uL (ref 0–200)
Basophils Relative: 0.9 %
Eosinophils Absolute: 252 cells/uL (ref 15–500)
Eosinophils Relative: 4.5 %
HCT: 32.9 % — ABNORMAL LOW (ref 35.0–45.0)
Hemoglobin: 10.5 g/dL — ABNORMAL LOW (ref 11.7–15.5)
Lymphs Abs: 902 cells/uL (ref 850–3900)
MCH: 29.5 pg (ref 27.0–33.0)
MCHC: 31.9 g/dL — ABNORMAL LOW (ref 32.0–36.0)
MCV: 92.4 fL (ref 80.0–100.0)
MPV: 9.7 fL (ref 7.5–12.5)
Monocytes Relative: 8.4 %
Neutro Abs: 3926 cells/uL (ref 1500–7800)
Neutrophils Relative %: 70.1 %
Platelets: 305 10*3/uL (ref 140–400)
RBC: 3.56 10*6/uL — ABNORMAL LOW (ref 3.80–5.10)
RDW: 12.3 % (ref 11.0–15.0)
Total Lymphocyte: 16.1 %
WBC: 5.6 10*3/uL (ref 3.8–10.8)

## 2021-03-03 LAB — HEPATIC FUNCTION PANEL
AG Ratio: 2 (calc) (ref 1.0–2.5)
ALT: 10 U/L (ref 6–29)
AST: 15 U/L (ref 10–35)
Albumin: 4.1 g/dL (ref 3.6–5.1)
Alkaline phosphatase (APISO): 74 U/L (ref 37–153)
Bilirubin, Direct: 0.3 mg/dL — ABNORMAL HIGH (ref 0.0–0.2)
Globulin: 2.1 g/dL (calc) (ref 1.9–3.7)
Indirect Bilirubin: 1.4 mg/dL (calc) — ABNORMAL HIGH (ref 0.2–1.2)
Total Bilirubin: 1.7 mg/dL — ABNORMAL HIGH (ref 0.2–1.2)
Total Protein: 6.2 g/dL (ref 6.1–8.1)

## 2021-03-03 LAB — TSH: TSH: 1.56 mIU/L (ref 0.40–4.50)

## 2021-03-03 LAB — LIPID PANEL
Cholesterol: 135 mg/dL (ref <200)
HDL: 49 mg/dL — ABNORMAL LOW (ref >=50)
LDL Cholesterol (Calc): 70 mg/dL (calc)
Non-HDL Cholesterol (Calc): 86 mg/dL (calc) (ref <130)
Total CHOL/HDL Ratio: 2.8 (calc) (ref <5.0)
Triglycerides: 81 mg/dL (ref <150)

## 2021-03-05 ENCOUNTER — Other Ambulatory Visit: Payer: Self-pay | Admitting: Nurse Practitioner

## 2021-03-05 DIAGNOSIS — D649 Anemia, unspecified: Secondary | ICD-10-CM

## 2021-03-08 ENCOUNTER — Telehealth: Payer: Self-pay

## 2021-03-08 ENCOUNTER — Ambulatory Visit (INDEPENDENT_AMBULATORY_CARE_PROVIDER_SITE_OTHER): Payer: Medicare PPO | Admitting: Nurse Practitioner

## 2021-03-08 ENCOUNTER — Other Ambulatory Visit: Payer: Self-pay

## 2021-03-08 ENCOUNTER — Encounter: Payer: Self-pay | Admitting: Nurse Practitioner

## 2021-03-08 DIAGNOSIS — Z0000 Encounter for general adult medical examination without abnormal findings: Secondary | ICD-10-CM | POA: Diagnosis not present

## 2021-03-08 NOTE — Telephone Encounter (Signed)
Ms. merrit, waugh are scheduled for a virtual visit with your provider today.    Just as we do with appointments in the office, we must obtain your consent to participate.  Your consent will be active for this visit and any virtual visit you may have with one of our providers in the next 365 days.    If you have a MyChart account, I can also send a copy of this consent to you electronically.  All virtual visits are billed to your insurance company just like a traditional visit in the office.  As this is a virtual visit, video technology does not allow for your provider to perform a traditional examination.  This may limit your provider's ability to fully assess your condition.  If your provider identifies any concerns that need to be evaluated in person or the need to arrange testing such as labs, EKG, etc, we will make arrangements to do so.    Although advances in technology are sophisticated, we cannot ensure that it will always work on either your end or our end.  If the connection with a video visit is poor, we may have to switch to a telephone visit.  With either a video or telephone visit, we are not always able to ensure that we have a secure connection.   I need to obtain your verbal consent now.   Are you willing to proceed with your visit today?   Pattye Meda has provided verbal consent on 03/08/2021 for a virtual visit (video or telephone).   Carroll Kinds, CMA 03/08/2021  1:14 PM

## 2021-03-08 NOTE — Progress Notes (Signed)
This service is provided via telemedicine  No vital signs collected/recorded due to the encounter was a telemedicine visit.   Location of patient (ex: home, work):  Home  Patient consents to a telephone visit:  Yes, see encounter dated 03/08/2021  Location of the provider (ex: office, home):  [place]  Name of any referring provider:  N/A  Names of all persons participating in the telemedicine service and their role in the encounter:  Sherrie Mustache, Nurse Practitioner, Carroll Kinds, CMA, patient and daughter [name].  Time spent on call:  11 minutes with medical assistant

## 2021-03-08 NOTE — Progress Notes (Signed)
Subjective:   Craig Wisnewski is a 80 y.o. female who presents for Medicare Annual (Subsequent) preventive examination.  Review of Systems     Cardiac Risk Factors include: advanced age (>90men, >80 women);dyslipidemia;hypertension;sedentary lifestyle     Objective:    Today's Vitals   03/08/21 1323  PainSc: 5    There is no height or weight on file to calculate BMI.  Advanced Directives 03/08/2021 03/02/2021 02/23/2021 05/29/2020 10/21/2019 07/07/2019 06/03/2019  Does Patient Have a Medical Advance Directive? Yes Yes Yes Yes Yes Yes Yes  Type of Advance Directive Out of facility DNR (pink MOST or yellow form) Out of facility DNR (pink MOST or yellow form) [place];Living will Out of facility DNR (pink MOST or yellow form);Living will Living will;Healthcare Power of [place];Out of facility DNR (pink MOST or yellow form) [place];Living will [place]  Does patient want to make changes to medical advance directive? No - Patient declined No - Patient declined No - Patient declined No - Patient declined No - Patient declined No - Patient declined No - Patient declined  Copy of [place] in Chart? - - No - copy requested - No - copy requested No - copy requested -  Would patient like information on creating a medical advance directive? - - - - - - -  Pre-existing out of facility DNR order (yellow form or pink MOST form) Pink MOST form placed in chart (order not valid for inpatient use) Pink MOST form placed in chart (order not valid for inpatient use) - Pink MOST form placed in chart (order not valid for inpatient use) - - -    Current Medications (verified) Outpatient Encounter Medications as of 03/08/2021  Medication Sig   aspirin 81 MG tablet Take 81 mg by mouth daily.   atorvastatin (LIPITOR) 40 MG tablet Take 1 tablet (40 mg total) by mouth daily.   clopidogrel (PLAVIX) 75 MG tablet Take 1 tablet (75 mg total) by  mouth daily.   Coenzyme Q10 (COQ-10 PO) Take 1 tablet daily by mouth.   desmopressin (DDAVP) 0.2 MG tablet Take 0.2 mg by mouth at bedtime. 2 tablets at bedtime   diclofenac Sodium (VOLTAREN) 1 % GEL APPLY TO THE AFFECTED AREA(S) THREE TIMES DAILY.   DULoxetine (CYMBALTA) 20 MG capsule Take 1 capsule (20 mg total) by mouth daily.   estradiol (ESTRACE) 0.1 MG/GM vaginal cream    gabapentin (NEURONTIN) 300 MG capsule TAKE ONE CAPSULE BY MOUTH THREE TIMES DAILY   methocarbamol (ROBAXIN) 500 MG tablet Take 1 tablet (500 mg total) by mouth every 8 (eight) hours as needed for muscle spasms.   mirabegron ER (MYRBETRIQ) 50 MG TB24 tablet Take 1 tablet by mouth daily.   nitroGLYCERIN (NITROSTAT) 0.4 MG SL tablet Place 1 tablet (0.4 mg total) under the tongue every 5 (five) minutes as needed for chest pain.   omeprazole (PRILOSEC) 20 MG capsule Take 1 capsule (20 mg total) by mouth daily.   trimethoprim (TRIMPEX) 100 MG tablet Take 1 tablet by mouth daily.   TURMERIC PO Take 1 tablet by mouth daily.    No facility-administered encounter medications on file as of 03/08/2021.    Allergies (verified) Patient has no known allergies.   History: Past Medical History:  Diagnosis Date   Arthritis    hands   CAD (coronary artery disease)    a. 2010: stents pRCA and dRCA. b. LHC 12/13/13: Minimal distal LM dz. LAD 95%  ostial w/ L-->R collat. LCx mild dz. RCA 95% mid-dRCA. Patent stents proximal and distal RCA. EF not assessed, Nl by nuclear study.      Central retinal vein occlusion, right eye    Chronic kidney disease    hx kidney stone   Depression    Diplopia    Ear pain    right   Eosinophilic esophagitis    Essential hypertension    GERD (gastroesophageal reflux disease)    H/O: whooping cough    Hyperlipidemia with target LDL less than 70    Incontinence    urine   Stroke ([place]) 2001   reesultant Rt spastic hemplegia and aphasia    Past Surgical History:  Procedure Laterality Date    ABDOMINAL HYSTERECTOMY     CAROTID STENT     cataracts     COLONOSCOPY N/A 10/05/2015   Procedure: COLONOSCOPY;  Surgeon: Manus Gunning, MD;  Location: [facility];  Service: Gastroenterology;  Laterality: N/A;   CORONARY ARTERY BYPASS GRAFT N/A 12/17/2013   Procedure: CORONARY ARTERY BYPASS GRAFTING (CABG);  Surgeon: Gaye Pollack, MD;  Location: [facility];  Service: Open Heart Surgery;  Laterality: N/A;  Times 2 using left internal mammary artery and endoscopically harvested right saphenous vein   ESOPHAGOGASTRODUODENOSCOPY     ESOPHAGOGASTRODUODENOSCOPY N/A 10/05/2015   Procedure: ESOPHAGOGASTRODUODENOSCOPY (EGD);  Surgeon: Manus Gunning, MD;  Location: [facility];  Service: Gastroenterology;  Laterality: N/A;   INTRAOPERATIVE TRANSESOPHAGEAL ECHOCARDIOGRAM N/A 12/17/2013   Procedure: INTRAOPERATIVE TRANSESOPHAGEAL ECHOCARDIOGRAM;  Surgeon: Gaye Pollack, MD;  Location: [facility] OR;  Service: Open Heart Surgery;  Laterality: N/A;   lasix     LEFT HEART CATHETERIZATION WITH CORONARY ANGIOGRAM N/A 12/13/2013   Procedure: LEFT HEART CATHETERIZATION WITH CORONARY ANGIOGRAM;  Surgeon: Jettie Booze, MD;  Location: [facility] CATH LAB;  Service: Cardiovascular;  Laterality: N/A;   retina injection     Family History  Problem Relation Age of Onset   Diabetes Mother    Heart disease Mother    Heart attack Father    Breast cancer Sister    Social History   Socioeconomic History   Marital status: Married    Spouse name: Not on file   Number of children: 2   Years of education: Not on file   Highest education level: Not on file  Occupational History   Occupation: retired Education officer, museum  Tobacco Use   Smoking status: Never   Smokeless tobacco: Never  Vaping Use   Vaping Use: Never used  Substance and Sexual Activity   Alcohol use: Yes    Comment: rarely   Drug use: No   Sexual activity: Never  Other Topics Concern   Not on file  Social History Narrative   Not on file    Social Determinants of Health   Financial Resource Strain: Not on file  Food Insecurity: Not on file  Transportation Needs: Not on file  Physical Activity: Not on file  Stress: Not on file  Social Connections: Not on file    Tobacco Counseling Counseling given: Not Answered   Clinical Intake:  Pre-visit preparation completed: Yes  Pain : 0-10 Pain Score: 5  Pain Type: Acute pain Pain Location: Leg Pain Orientation: Right Pain Descriptors / Indicators: Sharp Pain Onset: More than a month ago Pain Frequency: Intermittent     BMI - recorded: 29 Nutritional Status: BMI 25 -29 Overweight Nutritional Risks: Unintentional weight gain  How often do you need to have someone help you  when you read instructions, pamphlets, or other written materials from your doctor or pharmacy?: 1 - Never  Diabetic?no         Activities of Daily Living In your present state of health, do you have any difficulty performing the following activities: 03/08/2021  Hearing? N  Vision? N  Difficulty concentrating or making decisions? N  Walking or climbing stairs? Y  Dressing or bathing? Y  Comment able to wash herself, needing increase help dressing  Doing errands, shopping? Y  Preparing Food and eating ? Y  Comment food is prepared for her  Using the Toilet? Y  In the past six months, have you accidently leaked urine? Y  Do you have problems with loss of bowel control? N  Managing your Medications? Y  Comment has help filling boxs  Managing your Finances? Y  Housekeeping or managing your Housekeeping? Y  Comment at facility  Some recent data might be hidden    Patient Care Team: Lauree Chandler, NP as PCP - General (Geriatric Medicine) Armbruster, Carlota Raspberry, MD as Consulting Physician (Gastroenterology) Meredith Staggers, MD as Consulting Physician (Physical Medicine and Rehabilitation) Register, Luetta Nutting, PA-C as Physician Assistant (Dermatology) Marzetta Board, DPM as  Consulting Physician (Podiatry) Hayden Pedro, MD as Consulting Physician (Ophthalmology) Domingo Pulse, MD (Urology)  Indicate any recent Medical Services you may have received from other than [facility] providers in the past year (date may be approximate).     Assessment:   This is a routine wellness examination for Allysha.  Hearing/Vision screen Hearing Screening - Comments:: Patient has no hearing problems. Vision Screening - Comments:: Patient has stable vision. Eye exam a month ago. Patient sees Dr. Zigmund Daniel  Dietary issues and exercise activities discussed: Current Exercise Habits: The patient does not participate in regular exercise at present   Goals Addressed   None    Depression Screen PHQ 2/9 Scores 03/08/2021 10/27/2020 05/29/2020 10/21/2019 10/19/2019 07/20/2019 06/03/2019  PHQ - 2 Score 0 2 0 0 2 0 0  PHQ- 9 Score - - - - 5 - -    Fall Risk Fall Risk  03/08/2021 03/02/2021 10/27/2020 07/05/2020 05/29/2020  Falls in the past year? 0 0 0 0 1  Comment - - - - -  Number falls in past yr: 0 0 0 - -  Injury with Fall? 0 0 0 - 0  Risk for fall due to : No Fall Risks No Fall Risks - - -  Follow up Falls evaluation completed Falls evaluation completed - - -    FALL RISK PREVENTION PERTAINING TO THE HOME:  Any stairs in or around the home? No  If so, are there any without handrails? No  Home free of loose throw rugs in walkways, pet beds, electrical cords, etc? Yes  Adequate lighting in your home to reduce risk of falls? Yes   ASSISTIVE DEVICES UTILIZED TO PREVENT FALLS:  Life alert? Yes  Use of a cane, walker or w/c? Yes  Grab bars in the bathroom? Yes  Shower chair or bench in shower? Yes  Elevated toilet seat or a handicapped toilet? Yes   TIMED UP AND GO:  Was the test performed? No .  Cognitive Function:        Immunizations Immunization History  Administered Date(s) Administered   Influenza,inj,Quad PF,6+ Mos 01/27/2018   Influenza-Unspecified 01/21/2012,  02/05/2013, 02/21/2014, 01/26/2015, 01/23/2016, 01/23/2017, 01/13/2018, 01/21/2019   Moderna Sars-Covid-2 Vaccination 05/13/2019, 06/10/2019, 02/21/2020, 08/31/2020   Pneumococcal Polysaccharide-23 05/07/2012, 05/12/2013  Td 05/12/2013   Zoster Recombinat (Shingrix) 06/09/2020   Zoster, Live 05/07/2012    TDAP status: Up to date  Flu Vaccine status: Up to date  Pneumococcal vaccine status: Up to date  Covid-19 vaccine status: Completed vaccines  Qualifies for Shingles Vaccine? Yes   Zostavax completed Yes   Shingrix Completed?: Yes  Screening Tests Health Maintenance  Topic Date Due   Pneumonia Vaccine 33+ Years old (2 - PCV) 05/12/2014   Zoster Vaccines- Shingrix (2 of 2) 08/04/2020   COVID-19 Vaccine (5 - Booster for Moderna series) 10/26/2020   INFLUENZA VACCINE  11/20/2020   TETANUS/TDAP  05/13/2023   HPV VACCINES  Aged Out    Health Maintenance  Health Maintenance Due  Topic Date Due   Pneumonia Vaccine 83+ Years old (2 - PCV) 05/12/2014   Zoster Vaccines- Shingrix (2 of 2) 08/04/2020   COVID-19 Vaccine (5 - Booster for Moderna series) 10/26/2020   INFLUENZA VACCINE  11/20/2020    Colorectal cancer screening: No longer required.   Mammogram status: No longer required due to aged out.  Not a candidate for bone density   Lung Cancer Screening: (Low Dose CT Chest recommended if Age 55-80 years, 30 pack-year currently smoking OR have quit w/in 15years.) does not qualify.   Lung Cancer Screening Referral: na  Additional Screening:  Hepatitis C Screening: does not qualify; Completed na  Vision Screening: Recommended annual ophthalmology exams for early detection of glaucoma and other disorders of the eye. Is the patient up to date with their annual eye exam?  Yes  Who is the provider or what is the name of the office in which the patient attends annual eye exams? Dr Zigmund Daniel If pt is not established with a provider, would they like to be referred to a  provider to establish care? No .   Dental Screening: Recommended annual dental exams for proper oral hygiene  Community Resource Referral / Chronic Care Management: CRR required this visit?  No   CCM required this visit?  No      Plan:     I have personally reviewed and noted the following in the patient's chart:   Medical and social history Use of alcohol, tobacco or illicit drugs  Current medications and supplements including opioid prescriptions.  Functional ability and status Nutritional status Physical activity Advanced directives List of other physicians Hospitalizations, surgeries, and ER visits in previous 12 months Vitals Screenings to include cognitive, depression, and falls Referrals and appointments  In addition, I have reviewed and discussed with patient certain preventive protocols, quality metrics, and best practice recommendations. A written personalized care plan for preventive services as well as general preventive health recommendations were provided to patient.     Lauree Chandler, NP   03/08/2021    Virtual Visit via Telephone Note  I connected withNAME@ on 03/08/21 at  1:00 PM EST by telephone and verified that I am speaking with the correct person using two identifiers.  Location: Patient: home Provider: [facility].    I discussed the limitations, risks, security and privacy concerns of performing an evaluation and management service by telephone and the availability of in person appointments. I also discussed with the patient that there may be a patient responsible charge related to this service. The patient expressed understanding and agreed to proceed.   I discussed the assessment and treatment plan with the patient. The patient was provided an opportunity to ask questions and all were answered. The patient agreed with the  plan and demonstrated an understanding of the instructions.   The patient was advised to call back or seek an in-person  evaluation if the symptoms worsen or if the condition fails to improve as anticipated.  I provided 15 minutes of non-face-to-face time during this encounter.  Carlos American. Harle Battiest Avs printed and mailed

## 2021-03-08 NOTE — Patient Instructions (Signed)
Kristin [name] , Thank you for taking time to come for your Medicare Wellness Visit. I appreciate your ongoing commitment to your health goals. Please review the following plan we discussed and let me know if I can assist you in the future.   Screening recommendations/referrals: Colonoscopy aged out Mammogram aged out Recommended yearly ophthalmology/optometry visit for glaucoma screening and checkup Recommended yearly dental visit for hygiene and checkup  Vaccinations: Influenza vaccine up to date Pneumococcal vaccine to clarify pneumonia vaccines with previous PCP Tdap vaccine- up to date Shingles vaccine to get dates of shingles     Advanced directives: updated MOST today  Conditions/risks identified: advanced age, hx of CVA  Next appointment: yearly for awv   Preventive Care 82 Years and Older, Female Preventive care refers to lifestyle choices and visits with your health care provider that can promote health and wellness. What does preventive care include? A yearly physical exam. This is also called an annual well check. Dental exams once or twice a year. Routine eye exams. Ask your health care provider how often you should have your eyes checked. Personal lifestyle choices, including: Daily care of your teeth and gums. Regular physical activity. Eating a healthy diet. Avoiding tobacco and drug use. Limiting alcohol use. Practicing safe sex. Taking low-dose aspirin every day. Taking vitamin and mineral supplements as recommended by your health care provider. What happens during an annual well check? The services and screenings done by your health care provider during your annual well check will depend on your age, overall health, lifestyle risk factors, and family history of disease. Counseling  Your health care provider may ask you questions about your: Alcohol use. Tobacco use. Drug use. Emotional well-being. Home and relationship well-being. Sexual activity. Eating  habits. History of falls. Memory and ability to understand (cognition). Work and work Statistician. Reproductive health. Screening  You may have the following tests or measurements: Height, weight, and BMI. Blood pressure. Lipid and cholesterol levels. These may be checked every 5 years, or more frequently if you are over 24 years old. Skin check. Lung cancer screening. You may have this screening every year starting at age 82 if you have a 30-pack-year history of smoking and currently smoke or have quit within the past 15 years. Fecal occult blood test (FOBT) of the stool. You may have this test every year starting at age 82. Flexible sigmoidoscopy or colonoscopy. You may have a sigmoidoscopy every 5 years or a colonoscopy every 10 years starting at age 82. Hepatitis C blood test. Hepatitis B blood test. Sexually transmitted disease (STD) testing. Diabetes screening. This is done by checking your blood sugar (glucose) after you have not eaten for a while (fasting). You may have this done every 1-3 years. Bone density scan. This is done to screen for osteoporosis. You may have this done starting at age 82. Mammogram. This may be done every 1-2 years. Talk to your health care provider about how often you should have regular mammograms. Talk with your health care provider about your test results, treatment options, and if necessary, the need for more tests. Vaccines  Your health care provider may recommend certain vaccines, such as: Influenza vaccine. This is recommended every year. Tetanus, diphtheria, and acellular pertussis (Tdap, Td) vaccine. You may need a Td booster every 10 years. Zoster vaccine. You may need this after age 61. Pneumococcal 13-valent conjugate (PCV13) vaccine. One dose is recommended after age 2. Pneumococcal polysaccharide (PPSV23) vaccine. One dose is recommended after age 82. Talk  to your health care provider about which screenings and vaccines you need and how  often you need them. This information is not intended to replace advice given to you by your health care provider. Make sure you discuss any questions you have with your health care provider. Document Released: 05/05/2015 Document Revised: 12/27/2015 Document Reviewed: 02/07/2015 Elsevier Interactive Patient Education  2017 [place] Prevention in the Home Falls can cause injuries. They can happen to people of all ages. There are many things you can do to make your home safe and to help prevent falls. What can I do on the outside of my home? Regularly fix the edges of walkways and driveways and fix any cracks. Remove anything that might make you trip as you walk through a door, such as a raised step or threshold. Trim any bushes or trees on the path to your home. Use bright outdoor lighting. Clear any walking paths of anything that might make someone trip, such as rocks or tools. Regularly check to see if handrails are loose or broken. Make sure that both sides of any steps have handrails. Any raised decks and porches should have guardrails on the edges. Have any leaves, snow, or ice cleared regularly. Use sand or salt on walking paths during winter. Clean up any spills in your garage right away. This includes oil or grease spills. What can I do in the bathroom? Use night lights. Install grab bars by the toilet and in the tub and shower. Do not use towel bars as grab bars. Use non-skid mats or decals in the tub or shower. If you need to sit down in the shower, use a plastic, non-slip stool. Keep the floor dry. Clean up any water that spills on the floor as soon as it happens. Remove soap buildup in the tub or shower regularly. Attach bath mats securely with double-sided non-slip rug tape. Do not have throw rugs and other things on the floor that can make you trip. What can I do in the bedroom? Use night lights. Make sure that you have a light by your bed that is easy to  reach. Do not use any sheets or blankets that are too big for your bed. They should not hang down onto the floor. Have a firm chair that has side arms. You can use this for support while you get dressed. Do not have throw rugs and other things on the floor that can make you trip. What can I do in the kitchen? Clean up any spills right away. Avoid walking on wet floors. Keep items that you use a lot in easy-to-reach places. If you need to reach something above you, use a strong step stool that has a grab bar. Keep electrical cords out of the way. Do not use floor polish or wax that makes floors slippery. If you must use wax, use non-skid floor wax. Do not have throw rugs and other things on the floor that can make you trip. What can I do with my stairs? Do not leave any items on the stairs. Make sure that there are handrails on both sides of the stairs and use them. Fix handrails that are broken or loose. Make sure that handrails are as long as the stairways. Check any carpeting to make sure that it is firmly attached to the stairs. Fix any carpet that is loose or worn. Avoid having throw rugs at the top or bottom of the stairs. If you do have throw rugs,  attach them to the floor with carpet tape. Make sure that you have a light switch at the top of the stairs and the bottom of the stairs. If you do not have them, ask someone to add them for you. What else can I do to help prevent falls? Wear shoes that: Do not have high heels. Have rubber bottoms. Are comfortable and fit you well. Are closed at the toe. Do not wear sandals. If you use a stepladder: Make sure that it is fully opened. Do not climb a closed stepladder. Make sure that both sides of the stepladder are locked into place. Ask someone to hold it for you, if possible. Clearly mark and make sure that you can see: Any grab bars or handrails. First and last steps. Where the edge of each step is. Use tools that help you move  around (mobility aids) if they are needed. These include: Canes. Walkers. Scooters. Crutches. Turn on the lights when you go into a dark area. Replace any light bulbs as soon as they burn out. Set up your furniture so you have a clear path. Avoid moving your furniture around. If any of your floors are uneven, fix them. If there are any pets around you, be aware of where they are. Review your medicines with your doctor. Some medicines can make you feel dizzy. This can increase your chance of falling. Ask your doctor what other things that you can do to help prevent falls. This information is not intended to replace advice given to you by your health care provider. Make sure you discuss any questions you have with your health care provider. Document Released: 02/02/2009 Document Revised: 09/14/2015 Document Reviewed: 05/13/2014 Elsevier Interactive Patient Education  2017 [employer].

## 2021-03-14 ENCOUNTER — Other Ambulatory Visit: Payer: Medicare PPO

## 2021-03-14 ENCOUNTER — Other Ambulatory Visit: Payer: Self-pay

## 2021-03-14 DIAGNOSIS — D649 Anemia, unspecified: Secondary | ICD-10-CM

## 2021-03-14 LAB — CBC WITH DIFFERENTIAL/PLATELET
Absolute Monocytes: 490 cells/uL (ref 200–950)
Basophils Absolute: 39 cells/uL (ref 0–200)
Basophils Relative: 0.8 %
Eosinophils Absolute: 230 cells/uL (ref 15–500)
Eosinophils Relative: 4.7 %
HCT: 37.4 % (ref 35.0–45.0)
Hemoglobin: 12.4 g/dL (ref 11.7–15.5)
Lymphs Abs: 711 cells/uL — ABNORMAL LOW (ref 850–3900)
MCH: 30.5 pg (ref 27.0–33.0)
MCHC: 33.2 g/dL (ref 32.0–36.0)
MCV: 92.1 fL (ref 80.0–100.0)
MPV: 9.6 fL (ref 7.5–12.5)
Monocytes Relative: 10 %
Neutro Abs: 3430 cells/uL (ref 1500–7800)
Neutrophils Relative %: 70 %
Platelets: 335 10*3/uL (ref 140–400)
RBC: 4.06 10*6/uL (ref 3.80–5.10)
RDW: 12.8 % (ref 11.0–15.0)
Total Lymphocyte: 14.5 %
WBC: 4.9 10*3/uL (ref 3.8–10.8)

## 2021-03-29 ENCOUNTER — Other Ambulatory Visit: Payer: Self-pay | Admitting: Nurse Practitioner

## 2021-03-29 DIAGNOSIS — I69351 Hemiplegia and hemiparesis following cerebral infarction affecting right dominant side: Secondary | ICD-10-CM

## 2021-03-30 MED ORDER — METHOCARBAMOL 500 MG PO TABS: 500.0000 mg | ORAL_TABLET | Freq: Three times a day (TID) | ORAL | 3 refills | Status: DC | PRN

## 2021-03-30 NOTE — Telephone Encounter (Signed)
Patient has request refill on medication "Methocarbamol". Patient last refill was 03/02/2021. Patient was given 30 day supply. I'm assuming Sherrie Mustache, NP wanted to see if patient did well on medication before giving refills. Patient has requested that refills be added with medication approval. Medication pend and sent to Marlowe Sax, NP due to PCP Dewaine Oats Carlos American, NP being out of office. Please Advise.

## 2021-04-04 ENCOUNTER — Encounter: Payer: Self-pay | Admitting: Podiatry

## 2021-04-04 ENCOUNTER — Ambulatory Visit: Payer: Medicare PPO | Admitting: Podiatry

## 2021-04-04 ENCOUNTER — Other Ambulatory Visit: Payer: Self-pay

## 2021-04-04 DIAGNOSIS — L84 Corns and callosities: Secondary | ICD-10-CM

## 2021-04-04 DIAGNOSIS — G5791 Unspecified mononeuropathy of right lower limb: Secondary | ICD-10-CM

## 2021-04-04 DIAGNOSIS — M79674 Pain in right toe(s): Secondary | ICD-10-CM

## 2021-04-04 DIAGNOSIS — B351 Tinea unguium: Secondary | ICD-10-CM

## 2021-04-04 DIAGNOSIS — M79675 Pain in left toe(s): Secondary | ICD-10-CM

## 2021-04-05 ENCOUNTER — Encounter (INDEPENDENT_AMBULATORY_CARE_PROVIDER_SITE_OTHER): Payer: Medicare PPO | Admitting: Ophthalmology

## 2021-04-05 DIAGNOSIS — H35033 Hypertensive retinopathy, bilateral: Secondary | ICD-10-CM | POA: Diagnosis not present

## 2021-04-05 DIAGNOSIS — H34811 Central retinal vein occlusion, right eye, with macular edema: Secondary | ICD-10-CM

## 2021-04-05 DIAGNOSIS — H43813 Vitreous degeneration, bilateral: Secondary | ICD-10-CM | POA: Diagnosis not present

## 2021-04-05 DIAGNOSIS — I10 Essential (primary) hypertension: Secondary | ICD-10-CM | POA: Diagnosis not present

## 2021-04-08 NOTE — Progress Notes (Signed)
°  Subjective:  Patient ID: Kristin [name], female    DOB: Jan 17, 1941,  MRN: [id number]  Kristin [name] presents to clinic today for at risk foot care with history of peripheral neuropathy RLE, corn(s) right 3rd toe and painful thick toenails that are difficult to trim. Painful toenails interfere with ambulation. Aggravating factors include wearing enclosed shoe gear. Pain is relieved with periodic professional debridement. Painful corns are aggravated when weightbearing when wearing enclosed shoe gear. Pain is relieved with periodic professional debridement.  Patient is accompanied by her daughter on today's visit.They voice no new pedal problems on today's visit.  PCP is Lauree Chandler, NP , and last visit was 03/13/2021.  No Known Allergies  Review of Systems: Negative except as noted in the HPI. Objective:   Constitutional Kristin [name] is a pleasant 80 y.o. Caucasian female, WD, WN in NAD. AAO x 3.   Vascular CFT immediate b/l LE. Palpable DP/PT pulses b/l LE. Digital hair absent b/l. Skin temperature gradient WNL b/l. No pain with calf compression b/l. No edema noted b/l. No cyanosis or clubbing noted b/l LE.  Neurologic Oriented to person, place, and time. Protective sensation intact with 10 gram monofilament left lower extremity. Protective sensation decreased with 10 gram monofilament right lower extremity.  Dermatologic Pedal integument with normal turgor, texture and tone b/l LE. No open wounds b/l. No interdigital macerations b/l. Toenails 1-5 b/l elongated, thickened, discolored with subungual debris. +Tenderness with dorsal palpation of nailplates. Hyperkeratotic lesion(s) noted R 3rd toe.  Orthopedic: Dropfoot right lower extremity. Muscle strength 5/5 to all LE muscle groups of left lower extremity. No pain, crepitus or joint limitation noted with ROM bilateral LE. Clawtoe deformity 1-5 right.   Radiographs: None  Assessment:   1. Pain due to onychomycosis of toenails of both feet   2.  Corns   3. Neuropathy of right lower extremity    Plan:  Patient was evaluated and treated and all questions answered. Consent given for treatment as described below: -No new findings. No new orders. -Mycotic toenails 1-5 bilaterally were debrided in length and girth with sterile nail nippers and dremel without incident. -Corn(s) R 3rd toe pared utilizing sterile scalpel blade without complication or incident. Total number debrided=1. -Patient/POA to call should there be question/concern in the interim.  Return in about 3 months (around 07/03/2021).  Marzetta Board, DPM

## 2021-04-23 DIAGNOSIS — Z20828 Contact with and (suspected) exposure to other viral communicable diseases: Secondary | ICD-10-CM | POA: Diagnosis not present

## 2021-04-24 DIAGNOSIS — I69398 Other sequelae of cerebral infarction: Secondary | ICD-10-CM | POA: Diagnosis not present

## 2021-04-24 DIAGNOSIS — R2689 Other abnormalities of gait and mobility: Secondary | ICD-10-CM | POA: Diagnosis not present

## 2021-04-24 DIAGNOSIS — M79651 Pain in right thigh: Secondary | ICD-10-CM | POA: Diagnosis not present

## 2021-04-25 DIAGNOSIS — Z20828 Contact with and (suspected) exposure to other viral communicable diseases: Secondary | ICD-10-CM | POA: Diagnosis not present

## 2021-04-25 DIAGNOSIS — Z1159 Encounter for screening for other viral diseases: Secondary | ICD-10-CM | POA: Diagnosis not present

## 2021-04-26 DIAGNOSIS — I69398 Other sequelae of cerebral infarction: Secondary | ICD-10-CM | POA: Diagnosis not present

## 2021-04-26 DIAGNOSIS — M79651 Pain in right thigh: Secondary | ICD-10-CM | POA: Diagnosis not present

## 2021-04-26 DIAGNOSIS — R2689 Other abnormalities of gait and mobility: Secondary | ICD-10-CM | POA: Diagnosis not present

## 2021-04-30 ENCOUNTER — Ambulatory Visit: Payer: Medicare PPO | Admitting: Physical Medicine and Rehabilitation

## 2021-05-01 DIAGNOSIS — R2689 Other abnormalities of gait and mobility: Secondary | ICD-10-CM | POA: Diagnosis not present

## 2021-05-01 DIAGNOSIS — I69398 Other sequelae of cerebral infarction: Secondary | ICD-10-CM | POA: Diagnosis not present

## 2021-05-01 DIAGNOSIS — M79651 Pain in right thigh: Secondary | ICD-10-CM | POA: Diagnosis not present

## 2021-05-03 DIAGNOSIS — I69398 Other sequelae of cerebral infarction: Secondary | ICD-10-CM | POA: Diagnosis not present

## 2021-05-03 DIAGNOSIS — M79651 Pain in right thigh: Secondary | ICD-10-CM | POA: Diagnosis not present

## 2021-05-03 DIAGNOSIS — R2689 Other abnormalities of gait and mobility: Secondary | ICD-10-CM | POA: Diagnosis not present

## 2021-05-07 DIAGNOSIS — Z20822 Contact with and (suspected) exposure to covid-19: Secondary | ICD-10-CM | POA: Diagnosis not present

## 2021-05-07 DIAGNOSIS — I69398 Other sequelae of cerebral infarction: Secondary | ICD-10-CM | POA: Diagnosis not present

## 2021-05-07 DIAGNOSIS — R2689 Other abnormalities of gait and mobility: Secondary | ICD-10-CM | POA: Diagnosis not present

## 2021-05-07 DIAGNOSIS — M79651 Pain in right thigh: Secondary | ICD-10-CM | POA: Diagnosis not present

## 2021-05-08 ENCOUNTER — Encounter: Payer: Medicare PPO | Attending: Physical Medicine and Rehabilitation | Admitting: Physical Medicine and Rehabilitation

## 2021-05-08 ENCOUNTER — Encounter: Payer: Self-pay | Admitting: Physical Medicine and Rehabilitation

## 2021-05-08 ENCOUNTER — Other Ambulatory Visit: Payer: Self-pay

## 2021-05-08 VITALS — BP 154/89 | HR 101 | Ht 67.0 in

## 2021-05-08 DIAGNOSIS — Z7409 Other reduced mobility: Secondary | ICD-10-CM | POA: Diagnosis not present

## 2021-05-08 DIAGNOSIS — M79604 Pain in right leg: Secondary | ICD-10-CM | POA: Insufficient documentation

## 2021-05-08 NOTE — Progress Notes (Signed)
Subjective:    Patient ID: Kristin [name], female    DOB: 1940-05-03, 81 y.o.   MRN: [id number]  HPI  Kristin [name] presents for follow-up of bilateral shoulder pain and right sided hemiplegia.  1) Bilateral shoulder pain -Pain has been stable -Does not feel she needs repeat shoulder injection -worse with activity -she has completed OT at her facility. Does not feel she needs repeat OT.  -she continues to take Gabapentin 325m TID  2) Fall -no more falls since last visit -she has completed PT -She continues to have difficulty getting in and out of the bed and difficulty flexing the right hip.  -She continues to sleep in the recliner.   3) right sided spasticity -much improved -continues daily stretching regimen  4) left knee pain -limiting her mobility -she is currently not doing any therapy or using any medication for this.   5) Impaired mobility -she had COVID and mobility decreased after this. -was having a hard tim getting up and going to the bathroom.   6) Right sided pain -methocarbamol helps -massage helps  7) Has an appointment with Dr. MZigmund Danielfor her vision.   Prior history: Overall she is doing much better  Shoulder pain improved with OT and injection.  I am meeting with the patient today for follow-up of left MCA infarct and associated deficits.  [name] and is not as aware of her daily activity at her facility.   Her left shoulder pain and range of motion are much improved from corticosteroid injection. No need to repeat injection at this time.   Her pain in her right arm and leg is much improved since increasing her Gabapentin to 3015mTID. She continues on this dose. She does not need a refill today.  The patient demonstrated some of the stretches she is doing on her own. This does not make her drowsy.    She is falling asleep well at night but nocturia wakes her 3-4 times.   Urology has gone up up on DDAVP and this has  helped  Podiatry removed foreign body in foot and pain has resolved.   Mood has been good.  She is not eating as well as usual since she has less communal dining events at her center. She is not involved in any physical therapy but tries to stretch out her hand herself. She is able to ambulate within her room and use the rest room. She has a lot of joy in her life in living in the center. She loves listening to music.   Kristin [name] has continued outpatient follow-up with urology and continues on DDAVP and Myrbetriq for urinary incontinence. Her sleep continues to be disturbed as a result of this.   Cervical myofascial pain: For now, continue to apply compounded cream up to 4 times per day. Has improved with OT  Pain Inventory Average Pain 5  Pain Right Now 2 My pain is constant, sharp, burning, and aching  In the last 24 hours, has pain interfered with the following? General activity 8 Relation with others 7 Enjoyment of life 6 What TIME of day is your pain at its worst? evening, daytime, evening, night Sleep (in general) Fair  Pain is worse with: walking Pain improves with: rest, therapy/exercise, and medication Relief from Meds:  fair       Family History  Problem Relation Age of Onset   Diabetes Mother    Heart disease Mother    Heart attack Father  Breast cancer Sister    Social History   Socioeconomic History   Marital status: Married    Spouse name: Not on file   Number of children: 2   Years of education: Not on file   Highest education level: Not on file  Occupational History   Occupation: retired Education officer, museum  Tobacco Use   Smoking status: Never   Smokeless tobacco: Never  Vaping Use   Vaping Use: Never used  Substance and Sexual Activity   Alcohol use: Yes    Comment: rarely   Drug use: No   Sexual activity: Never  Other Topics Concern   Not on file  Social History Narrative   Not on file   Social Determinants of Health   Financial Resource  Strain: Not on file  Food Insecurity: Not on file  Transportation Needs: Not on file  Physical Activity: Not on file  Stress: Not on file  Social Connections: Not on file   Past Surgical History:  Procedure Laterality Date   ABDOMINAL HYSTERECTOMY     CAROTID STENT     cataracts     COLONOSCOPY N/A 10/05/2015   Procedure: COLONOSCOPY;  Surgeon: Manus Gunning, MD;  Location: [facility];  Service: Gastroenterology;  Laterality: N/A;   CORONARY ARTERY BYPASS GRAFT N/A 12/17/2013   Procedure: CORONARY ARTERY BYPASS GRAFTING (CABG);  Surgeon: Gaye Pollack, MD;  Location: [place];  Service: Open Heart Surgery;  Laterality: N/A;  Times 2 using left internal mammary artery and endoscopically harvested right saphenous vein   ESOPHAGOGASTRODUODENOSCOPY     ESOPHAGOGASTRODUODENOSCOPY N/A 10/05/2015   Procedure: ESOPHAGOGASTRODUODENOSCOPY (EGD);  Surgeon: Manus Gunning, MD;  Location: [facility];  Service: Gastroenterology;  Laterality: N/A;   INTRAOPERATIVE TRANSESOPHAGEAL ECHOCARDIOGRAM N/A 12/17/2013   Procedure: INTRAOPERATIVE TRANSESOPHAGEAL ECHOCARDIOGRAM;  Surgeon: Gaye Pollack, MD;  Location[facility] OR;  Service: Open Heart Surgery;  Laterality: N/A;   lasix     LEFT HEART CATHETERIZATION WITH CORONARY ANGIOGRAM N/A 12/13/2013   Procedure: LEFT HEART CATHETERIZATION WITH CORONARY ANGIOGRAM;  Surgeon: Jettie Booze, MD;  Location: [facility] CATH LAB;  Service: Cardiovascular;  Laterality: N/A;   retina injection     Past Medical History:  Diagnosis Date   Arthritis    hands   CAD (coronary artery disease)    a. 2010: stents pRCA and dRCA. b. LHC 12/13/13: Minimal distal LM dz. LAD 95% ostial w/ L-->R collat. LCx mild dz. RCA 95% mid-dRCA. Patent stents proximal and distal RCA. EF not assessed, Nl by nuclear study.      Central retinal vein occlusion, right eye    Chronic kidney disease    hx kidney stone   Depression    Diplopia    Ear pain    right   Eosinophilic  esophagitis    Essential hypertension    GERD (gastroesophageal reflux disease)    H/O: whooping cough    Hyperlipidemia with target LDL less than 70    Incontinence    urine   Stroke ([place]) 2001   reesultant Rt spastic hemplegia and aphasia    There were no vitals taken for this visit.  Opioid Risk Score:   Fall Risk Score:  `1  Depression screen PHQ 2/9  Depression screen [facility] 2/9 03/08/2021 10/27/2020 05/29/2020 10/21/2019 10/19/2019 07/20/2019 06/03/2019  Decreased Interest 0 1 0 0 - 0 0  Down, Depressed, Hopeless 0 1 0 0 2 0 0  PHQ - 2 Score 0 2 0 0 2 0 0  Altered sleeping - - - - 0 - -  Tired, decreased energy - - - - 1 - -  Change in appetite - - - - 0 - -  Feeling bad or failure about yourself  - - - - 0 - -  Trouble concentrating - - - - 1 - -  Moving slowly or fidgety/restless - - - - 1 - -  Suicidal thoughts - - - - 0 - -  PHQ-9 Score - - - - 5 - -   Review of Systems  Gastrointestinal: Negative.   Musculoskeletal:  Positive for gait problem.       Pain in right leg, right shoulder, & right knee  Neurological:  Positive for speech difficulty.  All other systems reviewed and are negative.     Objective:   Physical Exam Gen: no distress, normal appearing HEENT: oral mucosa pink and moist, NCAT Cardio: Reg rate Chest: normal effort, normal rate of breathing Abd: soft, non-distended Ext: no edema Psych: pleasant, normal affect Skin: intact Neuro: Patient communicates through short phrases and words as well as head nods.  Primarily expressive aphasia; she understands very well and follows commands well.  Right upper extremity with minimal voluntary movement.  She has 2+ to 3 out of 5 hip flexor and knee extensor strength right lower extremity.  Right ankle in splint with minimal active movement.  Left upper extremity and left lower extremity grossly 4+ to 5 out of 5. No tenderness over left subacromial joint. Cervical myofascial trigger points have improved.   Musculoskeletal: ongoing sublux.  Minimal pain with right knee range of motion today, passive or active.. Right hamstrings tight.  Psych: Pt's affect is pleasant Assessment & Plan:  1. Hx of left MCA infarct with spastic right hemiparesis, expressive greater than receptive aphasia. Continue PT and OT.  2. Primary osteoarthritis of bilateral knees 4. Insomnia, anxiety  5. Constipation 6. Bilateral shoulder pain related to chronic subluxation, mild adhesive capsulitis, chronic spasticity  7. OSA.  8. Left rotator cuff tendonitis/bursitis;biciptial tendonitis --generally improved.  9. Right rotator cuff tendonitis/right shoulder sublux 10.  Right sided neuropathic pain 11. NON-healing left medial ankle wound.  12. S/p fall 13. Right sided spasticity     Plan:  1. Can continue daily ambulation and exercise at facility.  Focus should be on mechanics and strengthening of the lower extremities, particularly the right side.  Also needs to improve hamstring flexibility. Prescribed occupational therapy to maximize range of motion of left shoulder. Added physical therapy for strengthening of lower extremities.  2. Continue Gabapentin dose 335m TID for pain control. Does not need refill today.   3. Can stop right shoulder harness which is providing minimal benefit.  4. Corticosteroid injection appears to have improved pain as well as range of motion. Trigger points improved. Discussed applying the compounded cream there up to 4 times per day. No need to repeat at this time. Daughter will call me if repeated needed. 5. Left foot swelling resolved after foreign body removal.  6. Recommended blue emu oil. Continue compounded gel.  7. Discussed benefits of Tai Chi and Love Your [employer] Program- will let he know when running 8. No more falls.  9. Spasticity is very well controlled, continue stretching regimen.  10. Apply blue emu oil to knees and shoulders as needed 11. Prescribed PT for left sided  quadriceps strengthening.   All questions answered. RTC in 6 months.

## 2021-05-10 DIAGNOSIS — I69398 Other sequelae of cerebral infarction: Secondary | ICD-10-CM | POA: Diagnosis not present

## 2021-05-10 DIAGNOSIS — M79651 Pain in right thigh: Secondary | ICD-10-CM | POA: Diagnosis not present

## 2021-05-10 DIAGNOSIS — R2689 Other abnormalities of gait and mobility: Secondary | ICD-10-CM | POA: Diagnosis not present

## 2021-05-15 DIAGNOSIS — R2689 Other abnormalities of gait and mobility: Secondary | ICD-10-CM | POA: Diagnosis not present

## 2021-05-15 DIAGNOSIS — M79651 Pain in right thigh: Secondary | ICD-10-CM | POA: Diagnosis not present

## 2021-05-15 DIAGNOSIS — I69398 Other sequelae of cerebral infarction: Secondary | ICD-10-CM | POA: Diagnosis not present

## 2021-05-17 DIAGNOSIS — R2689 Other abnormalities of gait and mobility: Secondary | ICD-10-CM | POA: Diagnosis not present

## 2021-05-17 DIAGNOSIS — M79651 Pain in right thigh: Secondary | ICD-10-CM | POA: Diagnosis not present

## 2021-05-17 DIAGNOSIS — I69398 Other sequelae of cerebral infarction: Secondary | ICD-10-CM | POA: Diagnosis not present

## 2021-05-24 ENCOUNTER — Encounter (INDEPENDENT_AMBULATORY_CARE_PROVIDER_SITE_OTHER): Payer: Medicare PPO | Admitting: Ophthalmology

## 2021-05-24 ENCOUNTER — Other Ambulatory Visit: Payer: Self-pay

## 2021-05-24 DIAGNOSIS — H34811 Central retinal vein occlusion, right eye, with macular edema: Secondary | ICD-10-CM

## 2021-05-24 DIAGNOSIS — H35033 Hypertensive retinopathy, bilateral: Secondary | ICD-10-CM

## 2021-05-24 DIAGNOSIS — H43813 Vitreous degeneration, bilateral: Secondary | ICD-10-CM

## 2021-05-24 DIAGNOSIS — I10 Essential (primary) hypertension: Secondary | ICD-10-CM | POA: Diagnosis not present

## 2021-05-28 DIAGNOSIS — Z20828 Contact with and (suspected) exposure to other viral communicable diseases: Secondary | ICD-10-CM | POA: Diagnosis not present

## 2021-06-12 ENCOUNTER — Other Ambulatory Visit: Payer: Self-pay | Admitting: Physical Medicine and Rehabilitation

## 2021-07-04 ENCOUNTER — Ambulatory Visit: Payer: Medicare PPO | Admitting: Podiatry

## 2021-07-04 ENCOUNTER — Encounter: Payer: Self-pay | Admitting: Podiatry

## 2021-07-04 ENCOUNTER — Other Ambulatory Visit: Payer: Self-pay

## 2021-07-04 DIAGNOSIS — G5791 Unspecified mononeuropathy of right lower limb: Secondary | ICD-10-CM

## 2021-07-04 DIAGNOSIS — M79674 Pain in right toe(s): Secondary | ICD-10-CM

## 2021-07-04 DIAGNOSIS — B351 Tinea unguium: Secondary | ICD-10-CM | POA: Diagnosis not present

## 2021-07-04 DIAGNOSIS — M79675 Pain in left toe(s): Secondary | ICD-10-CM

## 2021-07-11 ENCOUNTER — Other Ambulatory Visit: Payer: Self-pay

## 2021-07-11 ENCOUNTER — Encounter (INDEPENDENT_AMBULATORY_CARE_PROVIDER_SITE_OTHER): Payer: Medicare PPO | Admitting: Ophthalmology

## 2021-07-11 DIAGNOSIS — I10 Essential (primary) hypertension: Secondary | ICD-10-CM

## 2021-07-11 DIAGNOSIS — H34811 Central retinal vein occlusion, right eye, with macular edema: Secondary | ICD-10-CM

## 2021-07-11 DIAGNOSIS — H35033 Hypertensive retinopathy, bilateral: Secondary | ICD-10-CM

## 2021-07-11 DIAGNOSIS — H43813 Vitreous degeneration, bilateral: Secondary | ICD-10-CM

## 2021-07-11 NOTE — Progress Notes (Signed)
?  Subjective:  ?Patient ID: Kristin [name], female    DOB: 11-Jan-1941,  MRN: [id number] ? ?Kristin [name] presents to clinic today for at risk foot care with history of peripheral neuropathy and corn(s) R 3rd toe and painful thick toenails that are difficult to trim. Painful toenails interfere with ambulation. Aggravating factors include wearing enclosed shoe gear. Pain is relieved with periodic professional debridement. Painful corns are aggravated when weightbearing when wearing enclosed shoe gear. Pain is relieved with periodic professional debridement. ? ?Patient is accompanied by her daughter, Kristin [name], on today's visit. ? ?New problem(s): None.  ? ?PCP is Lauree Chandler, NP , and last visit was March 08, 2021. ? ?No Known Allergies ? ?Review of Systems: Negative except as noted in the HPI. ? ?Objective: ?Constitutional Kristin [name] is a pleasant 81 y.o. Caucasian female, WD, WN in NAD. AAO x 3.   ?Vascular CFT immediate b/l LE. Palpable DP/PT pulses b/l LE. Digital hair absent b/l. Skin temperature gradient WNL b/l. No pain with calf compression b/l. No edema noted b/l. No cyanosis or clubbing noted b/l LE.  ?Neurologic Oriented to person, place, and time. Protective sensation intact with 10 gram monofilament left lower extremity. Protective sensation decreased with 10 gram monofilament right lower extremity.  ?Dermatologic Pedal integument with normal turgor, texture and tone b/l LE. No open wounds b/l. No interdigital macerations b/l. Toenails 1-5 b/l elongated, thickened, discolored with subungual debris. +Tenderness with dorsal palpation of nailplates. Resolved hyperkeratosis right 3rd digit.  ?Orthopedic: Dropfoot right lower extremity. AFO RLE. Muscle strength 5/5 to all LE muscle groups of left lower extremity. No pain, crepitus or joint limitation noted with ROM bilateral LE. Clawtoe deformity 1-5 right.  ? ?Radiographs: None ? ?Assessment/Plan: ?1. Pain due to onychomycosis of toenails of both feet   ?2.  Neuropathy of right lower extremity   ?  ?-Examined patient. ?-Corn has resolved right 3rd digit. Continue use of digital toe caps daily. ?-Patient to continue soft, supportive shoe gear daily. ?-Toenails 1-5 b/l were debrided in length and girth with sterile nail nippers and dremel without iatrogenic bleeding.  ?-Patient/POA to call should there be question/concern in the interim.  ? ?Return in about 3 months (around 10/04/2021). ? ?Marzetta Board, DPM  ?

## 2021-07-16 DIAGNOSIS — Z20822 Contact with and (suspected) exposure to covid-19: Secondary | ICD-10-CM | POA: Diagnosis not present

## 2021-07-23 DIAGNOSIS — Z20822 Contact with and (suspected) exposure to covid-19: Secondary | ICD-10-CM | POA: Diagnosis not present

## 2021-08-13 DIAGNOSIS — Z20822 Contact with and (suspected) exposure to covid-19: Secondary | ICD-10-CM | POA: Diagnosis not present

## 2021-08-29 ENCOUNTER — Encounter (INDEPENDENT_AMBULATORY_CARE_PROVIDER_SITE_OTHER): Payer: Medicare PPO | Admitting: Ophthalmology

## 2021-08-29 DIAGNOSIS — I10 Essential (primary) hypertension: Secondary | ICD-10-CM

## 2021-08-29 DIAGNOSIS — H35033 Hypertensive retinopathy, bilateral: Secondary | ICD-10-CM

## 2021-08-29 DIAGNOSIS — H43813 Vitreous degeneration, bilateral: Secondary | ICD-10-CM | POA: Diagnosis not present

## 2021-08-29 DIAGNOSIS — H34811 Central retinal vein occlusion, right eye, with macular edema: Secondary | ICD-10-CM

## 2021-08-31 ENCOUNTER — Ambulatory Visit (INDEPENDENT_AMBULATORY_CARE_PROVIDER_SITE_OTHER): Payer: Medicare PPO | Admitting: Nurse Practitioner

## 2021-08-31 ENCOUNTER — Encounter: Payer: Self-pay | Admitting: Nurse Practitioner

## 2021-08-31 VITALS — BP 126/62 | HR 73 | Temp 96.6°F | Resp 18 | Ht 65.0 in

## 2021-08-31 DIAGNOSIS — N390 Urinary tract infection, site not specified: Secondary | ICD-10-CM | POA: Diagnosis not present

## 2021-08-31 DIAGNOSIS — I25110 Atherosclerotic heart disease of native coronary artery with unstable angina pectoris: Secondary | ICD-10-CM

## 2021-08-31 DIAGNOSIS — I6930 Unspecified sequelae of cerebral infarction: Secondary | ICD-10-CM | POA: Diagnosis not present

## 2021-08-31 DIAGNOSIS — K219 Gastro-esophageal reflux disease without esophagitis: Secondary | ICD-10-CM

## 2021-08-31 DIAGNOSIS — I69351 Hemiplegia and hemiparesis following cerebral infarction affecting right dominant side: Secondary | ICD-10-CM

## 2021-08-31 DIAGNOSIS — I10 Essential (primary) hypertension: Secondary | ICD-10-CM | POA: Diagnosis not present

## 2021-08-31 DIAGNOSIS — G8110 Spastic hemiplegia affecting unspecified side: Secondary | ICD-10-CM

## 2021-08-31 DIAGNOSIS — N3281 Overactive bladder: Secondary | ICD-10-CM | POA: Diagnosis not present

## 2021-08-31 DIAGNOSIS — I739 Peripheral vascular disease, unspecified: Secondary | ICD-10-CM | POA: Diagnosis not present

## 2021-08-31 DIAGNOSIS — E785 Hyperlipidemia, unspecified: Secondary | ICD-10-CM | POA: Diagnosis not present

## 2021-08-31 NOTE — Progress Notes (Signed)
? ? ?Careteam: ?Patient Care Team: ?Lauree Chandler, NP as PCP - General (Geriatric Medicine) ?Armbruster, Carlota Raspberry, MD as Consulting Physician (Gastroenterology) ?Meredith Staggers, MD as Consulting Physician (Physical Medicine and Rehabilitation) ?Register, Museum/gallery conservator, [employer] as Librarian, academic (Dermatology) ?Marzetta Board, DPM as Consulting Physician (Podiatry) ?Hayden Pedro, MD as Consulting Physician (Ophthalmology) ?Domingo Pulse, MD (Urology) ? ?PLACE OF SERVICE:  ?[facility]  ?Advanced Directive information ?Does Patient Have a Medical Advance Directive?: Yes, Type of Advance Directive: Out of facility DNR (pink MOST or yellow form), Pre-existing out of facility DNR order (yellow form or pink MOST form): Pink MOST form placed in chart (order not valid for inpatient use), Does patient want to make changes to medical advance directive?: No - Patient declined ? ?No Known Allergies ? ?Chief Complaint  ?Patient presents with  ? Medical Management of Chronic Issues  ?  PT is here for a 45M F/U  ? ? ? ?HPI: Patient is a 81 y.o. [name] for routine follow up.  ?She lives at [employer]. Here today with stepdaughter.  ? ?Not having terrible pain episodes have improved. Continues to follow up with physical medicine.  ? ?She has limited mobility and sleeps in a chair, when she has urinary frequency at night needs to be able to get up quickly  ?She has a lift chair to help with mobility.  ?She gets up with her lift chair to get onto her scooter to go to meals.  ? ?Mood has been going good.  ? ?No GERD.  ? ?Review of Systems:  ?Review of Systems  ?Unable to perform ROS: Other  ? ?Past Medical History:  ?Diagnosis Date  ? Arthritis   ? hands  ? CAD (coronary artery disease)   ? a. 2010: stents pRCA and dRCA. b. LHC 12/13/13: Minimal distal LM dz. LAD 95% ostial w/ L-->R collat. LCx mild dz. RCA 95% mid-dRCA. Patent stents proximal and distal RCA. EF not assessed, Nl by nuclear study.     ? Central retinal vein  occlusion, right eye   ? Chronic kidney disease   ? hx kidney stone  ? Depression   ? Diplopia   ? Ear pain   ? right  ? Eosinophilic esophagitis   ? Essential hypertension   ? GERD (gastroesophageal reflux disease)   ? H/O: whooping cough   ? Hyperlipidemia with target LDL less than 70   ? Incontinence   ? urine  ? Stroke [facility]) 2001  ? reesultant Rt spastic hemplegia and aphasia   ? ?Past Surgical History:  ?Procedure Laterality Date  ? ABDOMINAL HYSTERECTOMY    ? CAROTID STENT    ? cataracts    ? COLONOSCOPY N/A 10/05/2015  ? Procedure: COLONOSCOPY;  Surgeon: Manus Gunning, MD;  Location: [facility];  Service: Gastroenterology;  Laterality: N/A;  ? CORONARY ARTERY BYPASS GRAFT N/A 12/17/2013  ? Procedure: CORONARY ARTERY BYPASS GRAFTING (CABG);  Surgeon: Gaye Pollack, MD;  Location: [place];  Service: Open Heart Surgery;  Laterality: N/A;  Times 2 using left internal mammary artery and endoscopically harvested right saphenous vein  ? ESOPHAGOGASTRODUODENOSCOPY    ? ESOPHAGOGASTRODUODENOSCOPY N/A 10/05/2015  ? Procedure: ESOPHAGOGASTRODUODENOSCOPY (EGD);  Surgeon: Manus Gunning, MD;  Location: [facility];  Service: Gastroenterology;  Laterality: N/A;  ? INTRAOPERATIVE TRANSESOPHAGEAL ECHOCARDIOGRAM N/A 12/17/2013  ? Procedure: INTRAOPERATIVE TRANSESOPHAGEAL ECHOCARDIOGRAM;  Surgeon: Gaye Pollack, MD;  Location: [facility] OR;  Service: Open Heart Surgery;  Laterality: N/A;  ? lasix    ?  LEFT HEART CATHETERIZATION WITH CORONARY ANGIOGRAM N/A 12/13/2013  ? Procedure: LEFT HEART CATHETERIZATION WITH CORONARY ANGIOGRAM;  Surgeon: Jettie Booze, MD;  Location: [facility] CATH LAB;  Service: Cardiovascular;  Laterality: N/A;  ? retina injection    ? ?Social History: ?  reports that she has never smoked. She has never used smokeless tobacco. She reports current alcohol use. She reports that she does not use drugs. ? ?Family History  ?Problem Relation Age of Onset  ? Diabetes Mother   ? Heart disease Mother   ?  Heart attack Father   ? Breast cancer Sister   ? ? ?Medications: ?Patient's Medications  ?New Prescriptions  ? No medications on file  ?Previous Medications  ? ASPIRIN 81 MG TABLET    Take 81 mg by mouth daily.  ? ATORVASTATIN (LIPITOR) 40 MG TABLET    Take 1 tablet (40 mg total) by mouth daily.  ? CLOPIDOGREL (PLAVIX) 75 MG TABLET    Take 1 tablet (75 mg total) by mouth daily.  ? COENZYME Q10 (COQ-10 PO)    Take 1 tablet daily by mouth.  ? DESMOPRESSIN (DDAVP) 0.2 MG TABLET    Take 0.2 mg by mouth at bedtime. 2 tablets at bedtime  ? DICLOFENAC SODIUM (VOLTAREN) 1 % GEL    APPLY TO THE AFFECTED AREA(S) THREE TIMES DAILY.  ? DULOXETINE (CYMBALTA) 20 MG CAPSULE    Take 1 capsule (20 mg total) by mouth daily.  ? ESTRADIOL (ESTRACE) 0.1 MG/GM VAGINAL CREAM      ? GABAPENTIN (NEURONTIN) 300 MG CAPSULE    TAKE ONE CAPSULE BY MOUTH THREE TIMES DAILY  ? METHOCARBAMOL (ROBAXIN) 500 MG TABLET    Take 1 tablet (500 mg total) by mouth every 8 (eight) hours as needed for muscle spasms.  ? MIRABEGRON ER (MYRBETRIQ) 50 MG TB24 TABLET    Take 1 tablet by mouth daily.  ? NITROGLYCERIN (NITROSTAT) 0.4 MG SL TABLET    Place 1 tablet (0.4 mg total) under the tongue every 5 (five) minutes as needed for chest pain.  ? OMEPRAZOLE (PRILOSEC) 20 MG CAPSULE    Take 1 capsule (20 mg total) by mouth daily.  ? TRIMETHOPRIM (TRIMPEX) 100 MG TABLET    Take 1 tablet by mouth daily.  ? TURMERIC PO    Take 1 tablet by mouth daily.   ?Modified Medications  ? No medications on file  ?Discontinued Medications  ? No medications on file  ? ? ?Physical Exam: ? ?Vitals:  ? 08/31/21 1452  ?BP: 126/62  ?Pulse: 73  ?Resp: 18  ?Temp: (!) 96.6 ?F (35.9 ?C)  ?TempSrc: Temporal  ?SpO2: 93%  ?Height: 5' 5"  (1.651 m)  ? ?Body mass index is 31.78 kg/m?. ?Wt Readings from Last 3 Encounters:  ?03/02/21 191 lb (86.6 kg)  ?02/23/21 170 lb (77.1 kg)  ?05/29/20 161 lb (73 kg)  ? ? ?Physical Exam ?Constitutional:   ?   General: She is not in acute distress. ?   Appearance:  She is well-developed. She is not diaphoretic.  ?HENT:  ?   Head: Normocephalic and atraumatic.  ?   Mouth/Throat:  ?   Pharynx: No oropharyngeal exudate.  ?Eyes:  ?   Conjunctiva/sclera: Conjunctivae normal.  ?   Pupils: Pupils are equal, round, and reactive to light.  ?Cardiovascular:  ?   Rate and Rhythm: Normal rate and regular rhythm.  ?   Heart sounds: Normal heart sounds.  ?Pulmonary:  ?   Effort: Pulmonary effort is normal.  ?  Breath sounds: Normal breath sounds.  ?Abdominal:  ?   General: Bowel sounds are normal.  ?   Palpations: Abdomen is soft.  ?Musculoskeletal:     ?   General: No tenderness.  ?   Cervical back: Normal range of motion and neck supple.  ?Skin: ?   General: Skin is warm and dry.  ?Neurological:  ?   Mental Status: She is alert and oriented to person, place, and time.  ? ? ?Labs reviewed: ?Basic Metabolic Panel: ?Recent Labs  ?  02/22/21 ?2234 03/02/21 ?1225  ?NA 139  --   ?K 3.8  --   ?CL 104  --   ?CO2 26  --   ?GLUCOSE 119*  --   ?BUN 18  --   ?CREATININE 0.61  --   ?CALCIUM 9.3  --   ?TSH  --  1.56  ? ?Liver Function Tests: ?Recent Labs  ?  03/02/21 ?1225  ?AST 15  ?ALT 10  ?BILITOT 1.7*  ?PROT 6.2  ? ?No results for input(s): LIPASE, AMYLASE in the last 8760 hours. ?No results for input(s): AMMONIA in the last 8760 hours. ?CBC: ?Recent Labs  ?  02/22/21 ?2234 03/02/21 ?1225 03/14/21 ?1403  ?WBC 6.0 5.6 4.9  ?NEUTROABS 3.8 3,926 3,430  ?HGB 14.5 10.5* 12.4  ?HCT 43.8 32.9* 37.4  ?MCV 91.1 92.4 92.1  ?PLT 258 305 335  ? ?Lipid Panel: ?Recent Labs  ?  03/02/21 ?1225  ?CHOL 135  ?HDL 49*  ?[place] 70  ?TRIG 81  ?CHOLHDL 2.8  ? ?TSH: ?Recent Labs  ?  03/02/21 ?1225  ?TSH 1.56  ? ?A1C: ?Lab Results  ?Component Value Date  ? HGBA1C 5.4 06/02/2018  ? ? ? ?Assessment/Plan ?1. PAD (peripheral artery disease) ([place]) ?-stable, does not report pain, continues on asa and plavix ? ?2. Spastic hemiplegia of right dominant side as late effect of cerebral infarction [facility]) ?-ongoing, stable, continues  to work with physical medicine.  ? ?3. Essential hypertension ?-Blood pressure well controlled ?Continue current medications ?Recheck metabolic panel ?- CBC with Differential/Platelet ?- CMP with eGFR([place]

## 2021-09-01 LAB — CBC WITH DIFFERENTIAL/PLATELET
Absolute Monocytes: 482 {cells}/uL (ref 200–950)
Basophils Absolute: 42 {cells}/uL (ref 0–200)
Basophils Relative: 0.8 %
Eosinophils Absolute: 281 {cells}/uL (ref 15–500)
Eosinophils Relative: 5.3 %
HCT: 44.3 % (ref 35.0–45.0)
Hemoglobin: 14.5 g/dL (ref 11.7–15.5)
Lymphs Abs: 827 {cells}/uL — ABNORMAL LOW (ref 850–3900)
MCH: 30.1 pg (ref 27.0–33.0)
MCHC: 32.7 g/dL (ref 32.0–36.0)
MCV: 92.1 fL (ref 80.0–100.0)
MPV: 9.6 fL (ref 7.5–12.5)
Monocytes Relative: 9.1 %
Neutro Abs: 3668 {cells}/uL (ref 1500–7800)
Neutrophils Relative %: 69.2 %
Platelets: 225 Thousand/uL (ref 140–400)
RBC: 4.81 Million/uL (ref 3.80–5.10)
RDW: 12.6 % (ref 11.0–15.0)
Total Lymphocyte: 15.6 %
WBC: 5.3 Thousand/uL (ref 3.8–10.8)

## 2021-09-01 LAB — COMPLETE METABOLIC PANEL WITH GFR
AG Ratio: 1.9 (calc) (ref 1.0–2.5)
ALT: 10 U/L (ref 6–29)
AST: 12 U/L (ref 10–35)
Albumin: 4.2 g/dL (ref 3.6–5.1)
Alkaline phosphatase (APISO): 87 U/L (ref 37–153)
BUN/Creatinine Ratio: 34 (calc) — ABNORMAL HIGH (ref 6–22)
BUN: 19 mg/dL (ref 7–25)
CO2: 30 mmol/L (ref 20–32)
Calcium: 8.9 mg/dL (ref 8.6–10.4)
Chloride: 103 mmol/L (ref 98–110)
Creat: 0.56 mg/dL — ABNORMAL LOW (ref 0.60–0.95)
Globulin: 2.2 g/dL (calc) (ref 1.9–3.7)
Glucose, Bld: 104 mg/dL (ref 65–139)
Potassium: 3.9 mmol/L (ref 3.5–5.3)
Sodium: 140 mmol/L (ref 135–146)
Total Bilirubin: 0.5 mg/dL (ref 0.2–1.2)
Total Protein: 6.4 g/dL (ref 6.1–8.1)
eGFR: 90 mL/min/{1.73_m2} (ref >=60)

## 2021-09-06 ENCOUNTER — Encounter: Payer: Self-pay | Admitting: Physical Medicine and Rehabilitation

## 2021-09-10 ENCOUNTER — Other Ambulatory Visit: Payer: Self-pay | Admitting: Family

## 2021-09-10 ENCOUNTER — Encounter: Payer: Self-pay | Admitting: Nurse Practitioner

## 2021-09-10 DIAGNOSIS — G8110 Spastic hemiplegia affecting unspecified side: Secondary | ICD-10-CM

## 2021-09-10 NOTE — Telephone Encounter (Signed)
High risk or very high risk warning populated when attempting to refill medication. RX request sent to PCP for review and approval if warranted.   

## 2021-10-03 ENCOUNTER — Encounter: Payer: Self-pay | Admitting: Podiatry

## 2021-10-03 ENCOUNTER — Ambulatory Visit: Payer: Medicare PPO | Admitting: Podiatry

## 2021-10-03 DIAGNOSIS — G5791 Unspecified mononeuropathy of right lower limb: Secondary | ICD-10-CM | POA: Diagnosis not present

## 2021-10-03 DIAGNOSIS — M79675 Pain in left toe(s): Secondary | ICD-10-CM

## 2021-10-03 DIAGNOSIS — L84 Corns and callosities: Secondary | ICD-10-CM

## 2021-10-03 DIAGNOSIS — B351 Tinea unguium: Secondary | ICD-10-CM | POA: Diagnosis not present

## 2021-10-03 DIAGNOSIS — M79674 Pain in right toe(s): Secondary | ICD-10-CM

## 2021-10-08 NOTE — Progress Notes (Signed)
  Subjective:  Patient ID: Kristin [name], female    DOB: 11/13/1940,  MRN: [id number]  [name] presents to clinic today for at risk foot care with history of peripheral neuropathy and corn(s) b/l lower extremities and painful thick toenails that are difficult to trim. Painful toenails interfere with ambulation. Aggravating factors include wearing enclosed shoe gear. Pain is relieved with periodic professional debridement. Painful corns are aggravated when weightbearing when wearing enclosed shoe gear. Pain is relieved with periodic professional debridement.  She has corns bilaterally and uses silicone toe caps daily.  Patient's daughter is present during today's visit.  New problem(s): None.   PCP is Lauree Chandler, NP , and last visit was Aug 31, 2021.  No Known Allergies  Review of Systems: Negative except as noted in the HPI.  Objective: Constitutional Kristin [name] is a pleasant 81 y.o. Caucasian female, WD, WN in NAD. AAO x 3.   Vascular CFT immediate b/l LE. Palpable DP/PT pulses b/l LE. Digital hair absent b/l. Skin temperature gradient WNL b/l. No pain with calf compression b/l. No edema noted b/l. No cyanosis or clubbing noted b/l LE.  Neurologic Oriented to person, place, and time. Protective sensation intact with 10 gram monofilament left lower extremity. Protective sensation decreased with 10 gram monofilament right lower extremity.  Dermatologic Pedal integument with normal turgor, texture and tone b/l LE. No open wounds b/l. No interdigital macerations b/l. Toenails 1-5 b/l elongated, thickened, discolored with subungual debris. +Tenderness with dorsal palpation of nailplates. Hyperkeratotic lesion(s) distal tip of 2nd toes bilaterally.  No erythema, no edema, no drainage, no fluctuance.  Orthopedic: Dropfoot right lower extremity. AFO RLE. Muscle strength 5/5 to all LE muscle groups of left lower extremity. No pain, crepitus or joint limitation noted with ROM bilateral LE. Clawtoe  deformity 1-5 right.   Radiographs: None  Assessment/Plan: 1. Pain due to onychomycosis of toenails of both feet   2. Corns   3. Neuropathy of right lower extremity     -Patient was evaluated and treated. All patient's and/or POA's questions/concerns answered on today's visit. -Mycotic toenails 1-5 bilaterally were debrided in length and girth with sterile nail nippers and dremel without incident. -Corn(s) bilateral 2nd toes pared utilizing sterile scalpel blade without complication or incident. Total number debrided=2. -Continue silicone toe caps to afftected digits daily for protection. -Patient/POA to call should there be question/concern in the interim.   Return in about 3 months (around 01/03/2022).  Marzetta Board, DPM

## 2021-10-15 ENCOUNTER — Encounter (INDEPENDENT_AMBULATORY_CARE_PROVIDER_SITE_OTHER): Payer: Medicare PPO | Admitting: Ophthalmology

## 2021-10-15 DIAGNOSIS — H34811 Central retinal vein occlusion, right eye, with macular edema: Secondary | ICD-10-CM

## 2021-10-15 DIAGNOSIS — I10 Essential (primary) hypertension: Secondary | ICD-10-CM

## 2021-10-15 DIAGNOSIS — H43813 Vitreous degeneration, bilateral: Secondary | ICD-10-CM | POA: Diagnosis not present

## 2021-10-15 DIAGNOSIS — H35033 Hypertensive retinopathy, bilateral: Secondary | ICD-10-CM

## 2021-10-30 ENCOUNTER — Encounter: Payer: Self-pay | Admitting: Nurse Practitioner

## 2021-10-30 MED ORDER — NITROGLYCERIN 0.4 MG SL SUBL: 0.4000 mg | SUBLINGUAL_TABLET | SUBLINGUAL | 0 refills | Status: DC | PRN

## 2021-11-29 ENCOUNTER — Ambulatory Visit: Payer: Medicare PPO | Admitting: Physical Medicine and Rehabilitation

## 2021-12-03 ENCOUNTER — Encounter: Payer: Medicare PPO | Attending: Physical Medicine and Rehabilitation | Admitting: Physical Medicine and Rehabilitation

## 2021-12-03 VITALS — BP 125/76 | HR 70

## 2021-12-03 DIAGNOSIS — R531 Weakness: Secondary | ICD-10-CM | POA: Insufficient documentation

## 2021-12-03 DIAGNOSIS — R1310 Dysphagia, unspecified: Secondary | ICD-10-CM | POA: Diagnosis not present

## 2021-12-03 NOTE — Patient Instructions (Signed)
Incentive spirometer

## 2021-12-03 NOTE — Progress Notes (Signed)
Subjective:    Patient ID: Kristin [name], female    DOB: Jun 24, 1940, 81 y.o.   MRN: [id number]  HPI  Kristin [name] presents for follow-up of bilateral shoulder pain and right sided hemiplegia.  1) Dysphagia She has been having coughing and choking with water and pills -can drink her protein drinks -some shortness of breath with walking -no productive cough  2) Bilateral shoulder pain -Pain has been stable -Does not feel she needs repeat shoulder injection -worse with activity -she has completed OT at her facility. Does not feel she needs repeat OT.  -she continues to take Gabapentin 362m TID  2) Fall -no more falls since last visit -she has completed PT -She continues to have difficulty getting in and out of the bed and difficulty flexing the right hip.  -She continues to sleep in the recliner.   3) right sided spasticity -much improved -continues daily stretching regimen  4) left knee pain -limiting her mobility -she is currently not doing any therapy or using any medication for this.   5) Impaired mobility -she had COVID and mobility decreased after this. -was having a hard tim getting up and going to the bathroom.   6) Right sided pain -methocarbamol helps -massage helps  7) Has an appointment with Dr. MZigmund Danielfor her vision.   8) right sided weakness -she needs a new AFO as current one is very old -she needs a new OT referral for strengthening of her RUE  Prior history: Overall she is doing much better  Shoulder pain improved with OT and injection.  I am meeting with the patient today for follow-up of left MCA infarct and associated deficits.  [name] and is not as aware of her daily activity at her facility.   Her left shoulder pain and range of motion are much improved from corticosteroid injection. No need to repeat injection at this time.   Her pain in her right arm and leg is much improved since increasing her Gabapentin to 3053m TID. She continues on this dose. She does not need a refill today.  The patient demonstrated some of the stretches she is doing on her own. This does not make her drowsy.    She is falling asleep well at night but nocturia wakes her 3-4 times.   Urology has gone up up on DDAVP and this has helped  Podiatry removed foreign body in foot and pain has resolved.   Mood has been good.  She is not eating as well as usual since she has less communal dining events at her center. She is not involved in any physical therapy but tries to stretch out her hand herself. She is able to ambulate within her room and use the rest room. She has a lot of joy in her life in living in the center. She loves listening to music.   Kristin [name] has continued outpatient follow-up with urology and continues on DDAVP and Myrbetriq for urinary incontinence. Her sleep continues to be disturbed as a result of this.   Cervical myofascial pain: For now, continue to apply compounded cream up to 4 times per day. Has improved with OT  Pain Inventory Average Pain 0 Pain Right Now 0 My pain is intermittent  LOCATION OF PAIN  shoulder, leg  BOWEL Number of stools per week: normal Oral laxative use No  Type of laxative . Enema or suppository use No  History of colostomy No  Incontinent No  BLADDER Pads In and out cath, frequency . Able to self cath  . Bladder incontinence Yes  Frequent urination Yes  Leakage with coughing No  Difficulty starting stream No  Incomplete bladder emptying No    Mobility use a walker ability to climb steps?  no do you drive?  no  Function disabled: date disabled 04/1999 I need assistance with the following:  dressing, bathing, meal prep, household duties, and shopping  Neuro/Psych bladder control problems spasms  Prior Studies Any changes since last visit?  no  Physicians involved in your care Any changes since last visit?  no   Family History  Problem Relation Age of  Onset   Diabetes Mother    Heart disease Mother    Heart attack Father    Breast cancer Sister    Social History   Socioeconomic History   Marital status: Married    Spouse name: Not on file   Number of children: 2   Years of education: Not on file   Highest education level: Not on file  Occupational History   Occupation: retired Education officer, museum  Tobacco Use   Smoking status: Never   Smokeless tobacco: Never  Vaping Use   Vaping Use: Never used  Substance and Sexual Activity   Alcohol use: Yes    Comment: rarely   Drug use: No   Sexual activity: Never  Other Topics Concern   Not on file  Social History Narrative   Not on file   Social Determinants of Health   Financial Resource Strain: Not on file  Food Insecurity: Not on file  Transportation Needs: Not on file  Physical Activity: Not on file  Stress: Not on file  Social Connections: Not on file   Past Surgical History:  Procedure Laterality Date   ABDOMINAL HYSTERECTOMY     CAROTID STENT     cataracts     COLONOSCOPY N/A 10/05/2015   Procedure: COLONOSCOPY;  Surgeon: Manus Gunning, MD;  Location: [facility];  Service: Gastroenterology;  Laterality: N/A;   CORONARY ARTERY BYPASS GRAFT N/A 12/17/2013   Procedure: CORONARY ARTERY BYPASS GRAFTING (CABG);  Surgeon: Gaye Pollack, MD;  Location: [facility];  Service: Open Heart Surgery;  Laterality: N/A;  Times 2 using left internal mammary artery and endoscopically harvested right saphenous vein   ESOPHAGOGASTRODUODENOSCOPY     ESOPHAGOGASTRODUODENOSCOPY N/A 10/05/2015   Procedure: ESOPHAGOGASTRODUODENOSCOPY (EGD);  Surgeon: Manus Gunning, MD;  Location: [facility];  Service: Gastroenterology;  Laterality: N/A;   INTRAOPERATIVE TRANSESOPHAGEAL ECHOCARDIOGRAM N/A 12/17/2013   Procedure: INTRAOPERATIVE TRANSESOPHAGEAL ECHOCARDIOGRAM;  Surgeon: Gaye Pollack, MD;  Location: [facility] OR;  Service: Open Heart Surgery;  Laterality: N/A;   lasix     LEFT HEART  CATHETERIZATION WITH CORONARY ANGIOGRAM N/A 12/13/2013   Procedure: LEFT HEART CATHETERIZATION WITH CORONARY ANGIOGRAM;  Surgeon: Jettie Booze, MD;  Location: [facility] CATH LAB;  Service: Cardiovascular;  Laterality: N/A;   retina injection     Past Medical History:  Diagnosis Date   Arthritis    hands   CAD (coronary artery disease)    a. 2010: stents pRCA and dRCA. b. LHC 12/13/13: Minimal distal LM dz. LAD 95% ostial w/ L-->R collat. LCx mild dz. RCA 95% mid-dRCA. Patent stents proximal and distal RCA. EF not assessed, Nl by nuclear study.      Central retinal vein occlusion, right eye    Chronic kidney disease    hx kidney stone   Depression    Diplopia  Ear pain    right   Eosinophilic esophagitis    Essential hypertension    GERD (gastroesophageal reflux disease)    H/O: whooping cough    Hyperlipidemia with target LDL less than 70    Incontinence    urine   Stroke ([place]) 2001   reesultant Rt spastic hemplegia and aphasia    BP 125/76   Pulse 70   SpO2 95%   Opioid Risk Score:   Fall Risk Score:  `1  Depression screen PHQ 2/9     05/08/2021   11:10 AM 03/08/2021    1:05 PM 10/27/2020    2:15 PM 05/29/2020    2:24 PM 10/21/2019    9:05 AM 10/19/2019   12:57 PM 07/20/2019    1:31 PM  Depression screen PHQ 2/9  Decreased Interest 0 0 1 0 0  0  Down, Depressed, Hopeless 0 0 1 0 0 2 0  PHQ - 2 Score 0 0 2 0 0 2 0  Altered sleeping      0   Tired, decreased energy      1   Change in appetite      0   Feeling bad or failure about yourself       0   Trouble concentrating      1   Moving slowly or fidgety/restless      1   Suicidal thoughts      0   PHQ-9 Score      5       Family History  Problem Relation Age of Onset   Diabetes Mother    Heart disease Mother    Heart attack Father    Breast cancer Sister    Social History   Socioeconomic History   Marital status: Married    Spouse name: Not on file   Number of children: 2   Years of education: Not on  file   Highest education level: Not on file  Occupational History   Occupation: retired Education officer, museum  Tobacco Use   Smoking status: Never   Smokeless tobacco: Never  Vaping Use   Vaping Use: Never used  Substance and Sexual Activity   Alcohol use: Yes    Comment: rarely   Drug use: No   Sexual activity: Never  Other Topics Concern   Not on file  Social History Narrative   Not on file   Social Determinants of Health   Financial Resource Strain: Not on file  Food Insecurity: Not on file  Transportation Needs: Not on file  Physical Activity: Not on file  Stress: Not on file  Social Connections: Not on file   Past Surgical History:  Procedure Laterality Date   ABDOMINAL HYSTERECTOMY     CAROTID STENT     cataracts     COLONOSCOPY N/A 10/05/2015   Procedure: COLONOSCOPY;  Surgeon: Manus Gunning, MD;  Location: [facility];  Service: Gastroenterology;  Laterality: N/A;   CORONARY ARTERY BYPASS GRAFT N/A 12/17/2013   Procedure: CORONARY ARTERY BYPASS GRAFTING (CABG);  Surgeon: Gaye Pollack, MD;  Location: [facility];  Service: Open Heart Surgery;  Laterality: N/A;  Times 2 using left internal mammary artery and endoscopically harvested right saphenous vein   ESOPHAGOGASTRODUODENOSCOPY     ESOPHAGOGASTRODUODENOSCOPY N/A 10/05/2015   Procedure: ESOPHAGOGASTRODUODENOSCOPY (EGD);  Surgeon: Manus Gunning, MD;  Location: [facility];  Service: Gastroenterology;  Laterality: N/A;   INTRAOPERATIVE TRANSESOPHAGEAL ECHOCARDIOGRAM N/A 12/17/2013   Procedure: INTRAOPERATIVE TRANSESOPHAGEAL ECHOCARDIOGRAM;  Surgeon: Gaye Pollack, MD;  Location: [facility];  Service: Open Heart Surgery;  Laterality: N/A;   lasix     LEFT HEART CATHETERIZATION WITH CORONARY ANGIOGRAM N/A 12/13/2013   Procedure: LEFT HEART CATHETERIZATION WITH CORONARY ANGIOGRAM;  Surgeon: Jettie Booze, MD;  Location: [facility] CATH LAB;  Service: Cardiovascular;  Laterality: N/A;   retina injection     Past  Medical History:  Diagnosis Date   Arthritis    hands   CAD (coronary artery disease)    a. 2010: stents pRCA and dRCA. b. LHC 12/13/13: Minimal distal LM dz. LAD 95% ostial w/ L-->R collat. LCx mild dz. RCA 95% mid-dRCA. Patent stents proximal and distal RCA. EF not assessed, Nl by nuclear study.      Central retinal vein occlusion, right eye    Chronic kidney disease    hx kidney stone   Depression    Diplopia    Ear pain    right   Eosinophilic esophagitis    Essential hypertension    GERD (gastroesophageal reflux disease)    H/O: whooping cough    Hyperlipidemia with target LDL less than 70    Incontinence    urine   Stroke ([place]) 2001   reesultant Rt spastic hemplegia and aphasia    BP 125/76   Pulse 70   SpO2 95%   Opioid Risk Score:   Fall Risk Score:  `1  Depression screen PHQ 2/9     05/08/2021   11:10 AM 03/08/2021    1:05 PM 10/27/2020    2:15 PM 05/29/2020    2:24 PM 10/21/2019    9:05 AM 10/19/2019   12:57 PM 07/20/2019    1:31 PM  Depression screen PHQ 2/9  Decreased Interest 0 0 1 0 0  0  Down, Depressed, Hopeless 0 0 1 0 0 2 0  PHQ - 2 Score 0 0 2 0 0 2 0  Altered sleeping      0   Tired, decreased energy      1   Change in appetite      0   Feeling bad or failure about yourself       0   Trouble concentrating      1   Moving slowly or fidgety/restless      1   Suicidal thoughts      0   PHQ-9 Score      5    Review of Systems  Gastrointestinal: Negative.   Musculoskeletal:  Positive for gait problem.       Pain in right leg, right shoulder, & right knee  Neurological:  Positive for speech difficulty.  All other systems reviewed and are negative.     Objective:   Physical Exam Gen: no distress, normal appearing HEENT: oral mucosa pink and moist, NCAT Cardio: Reg rate Chest: normal effort, normal rate of breathing Abd: soft, non-distended Ext: no edema Psych: pleasant, normal affect Skin: intact Neuro: Patient communicates through short  phrases and words as well as head nods.  Primarily expressive aphasia; she understands very well and follows commands well.  Right upper extremity with minimal voluntary movement.  She has 2+ to 3 out of 5 hip flexor and knee extensor strength right lower extremity.  Right ankle in splint with minimal active movement.  Left upper extremity and left lower extremity grossly 4+ to 5 out of 5. No tenderness over left subacromial joint. Cervical myofascial trigger points have improved.  Musculoskeletal: ongoing  sublux.  Minimal pain with right knee range of motion today, passive or active.. Right hamstrings tight.  Psych: Pt's affect is pleasant Assessment & Plan:  1. Hx of left MCA infarct with spastic right hemiparesis, expressive greater than receptive aphasia. Continue PT and OT.  2. Primary osteoarthritis of bilateral knees 4. Insomnia, anxiety  5. Constipation 6. Bilateral shoulder pain related to chronic subluxation, mild adhesive capsulitis, chronic spasticity  7. OSA.  8. Left rotator cuff tendonitis/bursitis;biciptial tendonitis --generally improved.  9. Right rotator cuff tendonitis/right shoulder sublux 10.  Right sided neuropathic pain 11. NON-healing left medial ankle wound.  12. S/p fall 13. Right sided spasticity 14. Dysphagia     Plan:  1. Can continue daily ambulation and exercise at facility.  Focus should be on mechanics and strengthening of the lower extremities, particularly the right side.  Also needs to improve hamstring flexibility. Prescribed occupational therapy to maximize range of motion of left shoulder. Added physical therapy for strengthening of lower extremities.  2. Continue Gabapentin dose 362m TID for pain control. Does not need refill today.   3. Can stop right shoulder harness which is providing minimal benefit.  4. Corticosteroid injection appears to have improved pain as well as range of motion. Trigger points improved. Discussed applying the compounded  cream there up to 4 times per day. No need to repeat at this time. Daughter will call me if repeated needed. 5. Left foot swelling resolved after foreign body removal.  6. Recommended blue emu oil. Continue compounded gel.  7. Discussed benefits of Tai Chi and [employer] Your [employer] Program- will let he know when running 8. No more falls.  9. Spasticity is very well controlled, continue stretching regimen.  10. Apply blue emu oil to knees and shoulders as needed 11. Prescribed OT for right sided upper extremity strengthening 12. Provided her script to get new right sided AFO from [facility] 13. Provided SLP script for dysphagia eval and treatment  All questions answered. RTC in 6 months.

## 2021-12-09 ENCOUNTER — Other Ambulatory Visit: Payer: Self-pay | Admitting: Physical Medicine and Rehabilitation

## 2021-12-13 ENCOUNTER — Encounter: Payer: Self-pay | Admitting: Physical Medicine and Rehabilitation

## 2021-12-13 ENCOUNTER — Other Ambulatory Visit: Payer: Self-pay | Admitting: Nurse Practitioner

## 2021-12-13 DIAGNOSIS — M792 Neuralgia and neuritis, unspecified: Secondary | ICD-10-CM

## 2021-12-13 DIAGNOSIS — K2100 Gastro-esophageal reflux disease with esophagitis, without bleeding: Secondary | ICD-10-CM

## 2021-12-13 NOTE — Telephone Encounter (Signed)
High risk or very high risk warning populated when attempting to refill medication. RX request sent to PCP for review and approval if warranted.   

## 2021-12-14 ENCOUNTER — Other Ambulatory Visit: Payer: Self-pay | Admitting: Physical Medicine and Rehabilitation

## 2021-12-14 ENCOUNTER — Telehealth: Payer: Self-pay | Admitting: Internal Medicine

## 2021-12-14 ENCOUNTER — Encounter (INDEPENDENT_AMBULATORY_CARE_PROVIDER_SITE_OTHER): Payer: Medicare PPO | Admitting: Ophthalmology

## 2021-12-14 DIAGNOSIS — I10 Essential (primary) hypertension: Secondary | ICD-10-CM

## 2021-12-14 DIAGNOSIS — H35033 Hypertensive retinopathy, bilateral: Secondary | ICD-10-CM

## 2021-12-14 DIAGNOSIS — H34811 Central retinal vein occlusion, right eye, with macular edema: Secondary | ICD-10-CM | POA: Diagnosis not present

## 2021-12-14 DIAGNOSIS — R531 Weakness: Secondary | ICD-10-CM

## 2021-12-14 DIAGNOSIS — H43813 Vitreous degeneration, bilateral: Secondary | ICD-10-CM | POA: Diagnosis not present

## 2021-12-14 MED ORDER — HYDROCORTISONE (PERIANAL) 2.5 % EX CREA: 1.0000 | TOPICAL_CREAM | Freq: Two times a day (BID) | CUTANEOUS | 1 refills | Status: DC

## 2021-12-14 NOTE — Telephone Encounter (Signed)
Visible, protruding hemorrhoids with bright red blood per Tor Netters, FNP  Will send in Anusol HC cream bid

## 2021-12-17 DIAGNOSIS — I69398 Other sequelae of cerebral infarction: Secondary | ICD-10-CM | POA: Diagnosis not present

## 2021-12-17 DIAGNOSIS — R278 Other lack of coordination: Secondary | ICD-10-CM | POA: Diagnosis not present

## 2021-12-17 DIAGNOSIS — M6281 Muscle weakness (generalized): Secondary | ICD-10-CM | POA: Diagnosis not present

## 2021-12-20 DIAGNOSIS — R1310 Dysphagia, unspecified: Secondary | ICD-10-CM | POA: Diagnosis not present

## 2021-12-20 DIAGNOSIS — R1312 Dysphagia, oropharyngeal phase: Secondary | ICD-10-CM | POA: Diagnosis not present

## 2021-12-21 DIAGNOSIS — R1310 Dysphagia, unspecified: Secondary | ICD-10-CM | POA: Diagnosis not present

## 2021-12-21 DIAGNOSIS — R1312 Dysphagia, oropharyngeal phase: Secondary | ICD-10-CM | POA: Diagnosis not present

## 2021-12-21 DIAGNOSIS — M6281 Muscle weakness (generalized): Secondary | ICD-10-CM | POA: Diagnosis not present

## 2021-12-21 DIAGNOSIS — I69398 Other sequelae of cerebral infarction: Secondary | ICD-10-CM | POA: Diagnosis not present

## 2021-12-21 DIAGNOSIS — R278 Other lack of coordination: Secondary | ICD-10-CM | POA: Diagnosis not present

## 2021-12-24 DIAGNOSIS — I69398 Other sequelae of cerebral infarction: Secondary | ICD-10-CM | POA: Diagnosis not present

## 2021-12-24 DIAGNOSIS — R278 Other lack of coordination: Secondary | ICD-10-CM | POA: Diagnosis not present

## 2021-12-24 DIAGNOSIS — M6281 Muscle weakness (generalized): Secondary | ICD-10-CM | POA: Diagnosis not present

## 2021-12-25 DIAGNOSIS — I69398 Other sequelae of cerebral infarction: Secondary | ICD-10-CM | POA: Diagnosis not present

## 2021-12-25 DIAGNOSIS — M6281 Muscle weakness (generalized): Secondary | ICD-10-CM | POA: Diagnosis not present

## 2021-12-25 DIAGNOSIS — R278 Other lack of coordination: Secondary | ICD-10-CM | POA: Diagnosis not present

## 2021-12-27 DIAGNOSIS — I69398 Other sequelae of cerebral infarction: Secondary | ICD-10-CM | POA: Diagnosis not present

## 2021-12-27 DIAGNOSIS — M6281 Muscle weakness (generalized): Secondary | ICD-10-CM | POA: Diagnosis not present

## 2021-12-27 DIAGNOSIS — R278 Other lack of coordination: Secondary | ICD-10-CM | POA: Diagnosis not present

## 2022-01-01 DIAGNOSIS — M6281 Muscle weakness (generalized): Secondary | ICD-10-CM | POA: Diagnosis not present

## 2022-01-01 DIAGNOSIS — I69398 Other sequelae of cerebral infarction: Secondary | ICD-10-CM | POA: Diagnosis not present

## 2022-01-01 DIAGNOSIS — R278 Other lack of coordination: Secondary | ICD-10-CM | POA: Diagnosis not present

## 2022-01-02 DIAGNOSIS — M6281 Muscle weakness (generalized): Secondary | ICD-10-CM | POA: Diagnosis not present

## 2022-01-02 DIAGNOSIS — R278 Other lack of coordination: Secondary | ICD-10-CM | POA: Diagnosis not present

## 2022-01-02 DIAGNOSIS — I69398 Other sequelae of cerebral infarction: Secondary | ICD-10-CM | POA: Diagnosis not present

## 2022-01-03 DIAGNOSIS — M6281 Muscle weakness (generalized): Secondary | ICD-10-CM | POA: Diagnosis not present

## 2022-01-03 DIAGNOSIS — R278 Other lack of coordination: Secondary | ICD-10-CM | POA: Diagnosis not present

## 2022-01-03 DIAGNOSIS — I69398 Other sequelae of cerebral infarction: Secondary | ICD-10-CM | POA: Diagnosis not present

## 2022-01-04 ENCOUNTER — Ambulatory Visit: Payer: Medicare PPO | Admitting: Podiatry

## 2022-01-04 ENCOUNTER — Encounter: Payer: Self-pay | Admitting: Podiatry

## 2022-01-04 DIAGNOSIS — N202 Calculus of kidney with calculus of ureter: Secondary | ICD-10-CM | POA: Diagnosis not present

## 2022-01-04 DIAGNOSIS — N3281 Overactive bladder: Secondary | ICD-10-CM | POA: Diagnosis not present

## 2022-01-04 DIAGNOSIS — N3946 Mixed incontinence: Secondary | ICD-10-CM | POA: Diagnosis not present

## 2022-01-04 DIAGNOSIS — M79674 Pain in right toe(s): Secondary | ICD-10-CM

## 2022-01-04 DIAGNOSIS — R3581 Nocturnal polyuria: Secondary | ICD-10-CM | POA: Diagnosis not present

## 2022-01-04 DIAGNOSIS — G5791 Unspecified mononeuropathy of right lower limb: Secondary | ICD-10-CM

## 2022-01-04 DIAGNOSIS — R350 Frequency of micturition: Secondary | ICD-10-CM | POA: Diagnosis not present

## 2022-01-04 DIAGNOSIS — M79675 Pain in left toe(s): Secondary | ICD-10-CM

## 2022-01-04 DIAGNOSIS — B351 Tinea unguium: Secondary | ICD-10-CM | POA: Diagnosis not present

## 2022-01-04 DIAGNOSIS — Z87440 Personal history of urinary (tract) infections: Secondary | ICD-10-CM | POA: Diagnosis not present

## 2022-01-04 NOTE — Progress Notes (Signed)
This patient returns to my office for at risk foot care.  This patient requires this care by a professional since this patient will be at risk due to having coagulation defect due to taking plavix.  This patient is unable to cut nails himself since the patient cannot reach his nails.These nails are painful walking and wearing shoes. Patient presents to the office with her step daughter in a wheelchair. This patient presents for at risk foot care today.  General Appearance  Alert, conversant and in no acute stress.  Vascular  Dorsalis pedis and posterior tibial  pulses are absent  bilaterally due to swelling..  Capillary return is within normal limits  bilaterally. Temperature is within normal limits  bilaterally.Purplish discoloration.  Neurologic  deferred due to aphasia.  Nails Thick disfigured discolored nails with subungual debris  from hallux to fifth toes bilaterally. No evidence of bacterial infection or drainage bilaterally.  Orthopedic  No limitations of motion  feet .  No crepitus or effusions noted.  No bony pathology or digital deformities noted.  Skin  normotropic skin with no porokeratosis noted bilaterally.  No signs of infections or ulcers noted.     Onychomycosis  Pain in right toes  Pain in left toes  Consent was obtained for treatment procedures.   Mechanical debridement of nails 1-5  bilaterally performed with a nail nipper.  Filed with dremel without incident.    Return office visit     3 months                 Told patient to return for periodic foot care and evaluation due to potential at risk complications.   Gardiner Barefoot DPM

## 2022-01-09 ENCOUNTER — Encounter: Payer: Self-pay | Admitting: Physical Medicine and Rehabilitation

## 2022-01-09 DIAGNOSIS — R1312 Dysphagia, oropharyngeal phase: Secondary | ICD-10-CM | POA: Diagnosis not present

## 2022-01-09 DIAGNOSIS — I69398 Other sequelae of cerebral infarction: Secondary | ICD-10-CM | POA: Diagnosis not present

## 2022-01-09 DIAGNOSIS — M6281 Muscle weakness (generalized): Secondary | ICD-10-CM | POA: Diagnosis not present

## 2022-01-09 DIAGNOSIS — R1310 Dysphagia, unspecified: Secondary | ICD-10-CM | POA: Diagnosis not present

## 2022-01-09 DIAGNOSIS — R278 Other lack of coordination: Secondary | ICD-10-CM | POA: Diagnosis not present

## 2022-01-10 DIAGNOSIS — I69398 Other sequelae of cerebral infarction: Secondary | ICD-10-CM | POA: Diagnosis not present

## 2022-01-10 DIAGNOSIS — M6281 Muscle weakness (generalized): Secondary | ICD-10-CM | POA: Diagnosis not present

## 2022-01-10 DIAGNOSIS — R278 Other lack of coordination: Secondary | ICD-10-CM | POA: Diagnosis not present

## 2022-01-16 DIAGNOSIS — R278 Other lack of coordination: Secondary | ICD-10-CM | POA: Diagnosis not present

## 2022-01-16 DIAGNOSIS — I69398 Other sequelae of cerebral infarction: Secondary | ICD-10-CM | POA: Diagnosis not present

## 2022-01-16 DIAGNOSIS — M6281 Muscle weakness (generalized): Secondary | ICD-10-CM | POA: Diagnosis not present

## 2022-01-18 DIAGNOSIS — I69398 Other sequelae of cerebral infarction: Secondary | ICD-10-CM | POA: Diagnosis not present

## 2022-01-18 DIAGNOSIS — M6281 Muscle weakness (generalized): Secondary | ICD-10-CM | POA: Diagnosis not present

## 2022-01-18 DIAGNOSIS — R278 Other lack of coordination: Secondary | ICD-10-CM | POA: Diagnosis not present

## 2022-01-22 DIAGNOSIS — R278 Other lack of coordination: Secondary | ICD-10-CM | POA: Diagnosis not present

## 2022-01-22 DIAGNOSIS — M6281 Muscle weakness (generalized): Secondary | ICD-10-CM | POA: Diagnosis not present

## 2022-01-22 DIAGNOSIS — I69398 Other sequelae of cerebral infarction: Secondary | ICD-10-CM | POA: Diagnosis not present

## 2022-01-23 DIAGNOSIS — I69398 Other sequelae of cerebral infarction: Secondary | ICD-10-CM | POA: Diagnosis not present

## 2022-01-23 DIAGNOSIS — R278 Other lack of coordination: Secondary | ICD-10-CM | POA: Diagnosis not present

## 2022-01-23 DIAGNOSIS — M6281 Muscle weakness (generalized): Secondary | ICD-10-CM | POA: Diagnosis not present

## 2022-01-24 DIAGNOSIS — R1312 Dysphagia, oropharyngeal phase: Secondary | ICD-10-CM | POA: Diagnosis not present

## 2022-01-24 DIAGNOSIS — R1310 Dysphagia, unspecified: Secondary | ICD-10-CM | POA: Diagnosis not present

## 2022-01-28 DIAGNOSIS — R1310 Dysphagia, unspecified: Secondary | ICD-10-CM | POA: Diagnosis not present

## 2022-01-28 DIAGNOSIS — R1312 Dysphagia, oropharyngeal phase: Secondary | ICD-10-CM | POA: Diagnosis not present

## 2022-01-29 DIAGNOSIS — M6281 Muscle weakness (generalized): Secondary | ICD-10-CM | POA: Diagnosis not present

## 2022-01-29 DIAGNOSIS — R278 Other lack of coordination: Secondary | ICD-10-CM | POA: Diagnosis not present

## 2022-01-29 DIAGNOSIS — I69398 Other sequelae of cerebral infarction: Secondary | ICD-10-CM | POA: Diagnosis not present

## 2022-01-30 DIAGNOSIS — R1310 Dysphagia, unspecified: Secondary | ICD-10-CM | POA: Diagnosis not present

## 2022-01-30 DIAGNOSIS — R1312 Dysphagia, oropharyngeal phase: Secondary | ICD-10-CM | POA: Diagnosis not present

## 2022-01-31 DIAGNOSIS — R278 Other lack of coordination: Secondary | ICD-10-CM | POA: Diagnosis not present

## 2022-01-31 DIAGNOSIS — I69398 Other sequelae of cerebral infarction: Secondary | ICD-10-CM | POA: Diagnosis not present

## 2022-01-31 DIAGNOSIS — M6281 Muscle weakness (generalized): Secondary | ICD-10-CM | POA: Diagnosis not present

## 2022-02-05 DIAGNOSIS — I69398 Other sequelae of cerebral infarction: Secondary | ICD-10-CM | POA: Diagnosis not present

## 2022-02-05 DIAGNOSIS — M6281 Muscle weakness (generalized): Secondary | ICD-10-CM | POA: Diagnosis not present

## 2022-02-05 DIAGNOSIS — R278 Other lack of coordination: Secondary | ICD-10-CM | POA: Diagnosis not present

## 2022-02-07 DIAGNOSIS — I69398 Other sequelae of cerebral infarction: Secondary | ICD-10-CM | POA: Diagnosis not present

## 2022-02-07 DIAGNOSIS — R278 Other lack of coordination: Secondary | ICD-10-CM | POA: Diagnosis not present

## 2022-02-07 DIAGNOSIS — M6281 Muscle weakness (generalized): Secondary | ICD-10-CM | POA: Diagnosis not present

## 2022-02-13 ENCOUNTER — Encounter (INDEPENDENT_AMBULATORY_CARE_PROVIDER_SITE_OTHER): Payer: Medicare PPO | Admitting: Ophthalmology

## 2022-02-13 DIAGNOSIS — H34811 Central retinal vein occlusion, right eye, with macular edema: Secondary | ICD-10-CM | POA: Diagnosis not present

## 2022-02-13 DIAGNOSIS — H35033 Hypertensive retinopathy, bilateral: Secondary | ICD-10-CM | POA: Diagnosis not present

## 2022-02-13 DIAGNOSIS — I10 Essential (primary) hypertension: Secondary | ICD-10-CM

## 2022-02-13 DIAGNOSIS — H43813 Vitreous degeneration, bilateral: Secondary | ICD-10-CM

## 2022-02-20 DIAGNOSIS — I69398 Other sequelae of cerebral infarction: Secondary | ICD-10-CM | POA: Diagnosis not present

## 2022-02-20 DIAGNOSIS — R278 Other lack of coordination: Secondary | ICD-10-CM | POA: Diagnosis not present

## 2022-02-20 DIAGNOSIS — M6281 Muscle weakness (generalized): Secondary | ICD-10-CM | POA: Diagnosis not present

## 2022-02-21 DIAGNOSIS — I69398 Other sequelae of cerebral infarction: Secondary | ICD-10-CM | POA: Diagnosis not present

## 2022-02-21 DIAGNOSIS — R278 Other lack of coordination: Secondary | ICD-10-CM | POA: Diagnosis not present

## 2022-02-21 DIAGNOSIS — M6281 Muscle weakness (generalized): Secondary | ICD-10-CM | POA: Diagnosis not present

## 2022-02-27 ENCOUNTER — Telehealth: Payer: Self-pay

## 2022-02-27 DIAGNOSIS — I69398 Other sequelae of cerebral infarction: Secondary | ICD-10-CM | POA: Diagnosis not present

## 2022-02-27 DIAGNOSIS — R278 Other lack of coordination: Secondary | ICD-10-CM | POA: Diagnosis not present

## 2022-02-27 DIAGNOSIS — M6281 Muscle weakness (generalized): Secondary | ICD-10-CM | POA: Diagnosis not present

## 2022-03-01 DIAGNOSIS — R278 Other lack of coordination: Secondary | ICD-10-CM | POA: Diagnosis not present

## 2022-03-01 DIAGNOSIS — M6281 Muscle weakness (generalized): Secondary | ICD-10-CM | POA: Diagnosis not present

## 2022-03-01 DIAGNOSIS — I69398 Other sequelae of cerebral infarction: Secondary | ICD-10-CM | POA: Diagnosis not present

## 2022-03-04 ENCOUNTER — Encounter: Payer: Self-pay | Admitting: Nurse Practitioner

## 2022-03-04 ENCOUNTER — Ambulatory Visit: Payer: Medicare PPO | Admitting: Nurse Practitioner

## 2022-03-04 VITALS — BP 128/74 | HR 75 | Temp 97.3°F

## 2022-03-04 DIAGNOSIS — I739 Peripheral vascular disease, unspecified: Secondary | ICD-10-CM | POA: Diagnosis not present

## 2022-03-04 DIAGNOSIS — H6122 Impacted cerumen, left ear: Secondary | ICD-10-CM

## 2022-03-04 DIAGNOSIS — I10 Essential (primary) hypertension: Secondary | ICD-10-CM | POA: Diagnosis not present

## 2022-03-04 DIAGNOSIS — Z23 Encounter for immunization: Secondary | ICD-10-CM | POA: Diagnosis not present

## 2022-03-04 DIAGNOSIS — N3281 Overactive bladder: Secondary | ICD-10-CM

## 2022-03-04 DIAGNOSIS — I69351 Hemiplegia and hemiparesis following cerebral infarction affecting right dominant side: Secondary | ICD-10-CM | POA: Diagnosis not present

## 2022-03-04 NOTE — Progress Notes (Unsigned)
Careteam: Patient Care Team: Lauree Chandler, NP as PCP - General (Geriatric Medicine) Armbruster, Carlota Raspberry, MD as Consulting Physician (Gastroenterology) Meredith Staggers, MD as Consulting Physician (Physical Medicine and Rehabilitation) Register, Luetta Nutting, PA-C as Physician Assistant (Dermatology) Marzetta Board, DPM as Consulting Physician (Podiatry) Hayden Pedro, MD as Consulting Physician (Ophthalmology) Domingo Pulse, MD (Urology)  PLACE OF SERVICE:  [place] Directive information Does Patient Have a Medical Advance Directive?: Yes, Type of Advance Directive: Out of facility DNR (pink MOST or yellow form), Pre-existing out of facility DNR order (yellow form or pink MOST form): Pink MOST form placed in chart (order not valid for inpatient use), Does patient want to make changes to medical advance directive?: No - Patient declined  No Known Allergies  Chief Complaint  Patient presents with   Medical Management of Chronic Issues    6 month follow-up. Discuss need for pneumonia, shingrix, covid booster, and AWV. Flu vaccine today.      HPI: Patient is a 81 y.o. female for routine follow up. Here with step daughter [name]. No acute concerns. Doing well at this time.  Continues to follow up with multiple specialist.   Ongoing ST for swallowing and OT for transfer, ST for strength and endurance.  Trying to keep her as independent as long has they can.   Continues with urology for oab and recurrent UTi  Continues with physical medicine for spasm and right sided hemiplegia. No recent issues.   Did have some issus with hemorrhoids but use OTC Anusol which helped.   Review of Systems:  Review of Systems  Unable to perform ROS: Other    Past Medical History:  Diagnosis Date   Arthritis    hands   CAD (coronary artery disease)    a. 2010: stents pRCA and dRCA. b. LHC 12/13/13: Minimal distal LM dz. LAD 95% ostial w/ L-->R collat. LCx mild dz. RCA  95% mid-dRCA. Patent stents proximal and distal RCA. EF not assessed, Nl by nuclear study.      Central retinal vein occlusion, right eye    Chronic kidney disease    hx kidney stone   Depression    Diplopia    Ear pain    right   Eosinophilic esophagitis    Essential hypertension    GERD (gastroesophageal reflux disease)    H/O: whooping cough    Hyperlipidemia with target LDL less than 70    Incontinence    urine   Stroke ([place]) 2001   reesultant Rt spastic hemplegia and aphasia    Past Surgical History:  Procedure Laterality Date   ABDOMINAL HYSTERECTOMY     CAROTID STENT     cataracts     COLONOSCOPY N/A 10/05/2015   Procedure: COLONOSCOPY;  Surgeon: Manus Gunning, MD;  Location: [facility];  Service: Gastroenterology;  Laterality: N/A;   CORONARY ARTERY BYPASS GRAFT N/A 12/17/2013   Procedure: CORONARY ARTERY BYPASS GRAFTING (CABG);  Surgeon: Gaye Pollack, MD;  Location: [place];  Service: Open Heart Surgery;  Laterality: N/A;  Times 2 using left internal mammary artery and endoscopically harvested right saphenous vein   ESOPHAGOGASTRODUODENOSCOPY     ESOPHAGOGASTRODUODENOSCOPY N/A 10/05/2015   Procedure: ESOPHAGOGASTRODUODENOSCOPY (EGD);  Surgeon: Manus Gunning, MD;  Location: [facility];  Service: Gastroenterology;  Laterality: N/A;   INTRAOPERATIVE TRANSESOPHAGEAL ECHOCARDIOGRAM N/A 12/17/2013   Procedure: INTRAOPERATIVE TRANSESOPHAGEAL ECHOCARDIOGRAM;  Surgeon: Gaye Pollack, MD;  Location: [facility];  Service: Open Heart  Surgery;  Laterality: N/A;   lasix     LEFT HEART CATHETERIZATION WITH CORONARY ANGIOGRAM N/A 12/13/2013   Procedure: LEFT HEART CATHETERIZATION WITH CORONARY ANGIOGRAM;  Surgeon: Jettie Booze, MD;  Location: [facility] C[facility];  Service: Cardiovascular;  Laterality: N/A;   retina injection     Social History:   reports that she has never smoked. She has never used smokeless tobacco. She reports current alcohol use. She reports that she  does not use drugs.  Family History  Problem Relation Age of Onset   Diabetes Mother    Heart disease Mother    Heart attack Father    Breast cancer Sister     Medications: Patient's Medications  New Prescriptions   No medications on file  Previous Medications   ASPIRIN 81 MG TABLET    Take 81 mg by mouth daily.   ATORVASTATIN (LIPITOR) 40 MG TABLET    Take 1 tablet (40 mg total) by mouth daily.   CHOLECALCIFEROL (VITAMIN D3) 50 MCG (2000 UT) CAPSULE    Take 2,000 Units by mouth daily.   CLOPIDOGREL (PLAVIX) 75 MG TABLET    TAKE ONE TABLET BY MOUTH DAILY   COENZYME Q10 (COQ-10 PO)    Take 1 tablet daily by mouth.   DESMOPRESSIN (DDAVP) 0.2 MG TABLET    Take 0.4 mg by mouth at bedtime.   DICLOFENAC SODIUM (VOLTAREN) 1 % GEL    APPLY TO THE AFFECTED AREA(S) THREE TIMES DAILY.   DOCUSATE SODIUM (COLACE) 100 MG CAPSULE    Take 100 mg by mouth daily.   DULOXETINE (CYMBALTA) 20 MG CAPSULE    TAKE ONE CAPSULE BY MOUTH DAILY   ESTRADIOL (ESTRACE) 0.1 MG/GM VAGINAL CREAM       GABAPENTIN (NEURONTIN) 300 MG CAPSULE    TAKE ONE CAPSULE BY MOUTH THREE TIMES DAILY   HYDROCORTISONE (ANUSOL-HC) 2.5 % RECTAL CREAM    Place 1 Application rectally 2 (two) times daily.   METHOCARBAMOL (ROBAXIN) 500 MG TABLET    Take 1 tablet (500 mg total) by mouth every 8 (eight) hours as needed for muscle spasms.   MIRABEGRON ER (MYRBETRIQ) 50 MG TB24 TABLET    Take 1 tablet by mouth daily.   NITROGLYCERIN (NITROSTAT) 0.4 MG SL TABLET    Place 1 tablet (0.4 mg total) under the tongue every 5 (five) minutes as needed for chest pain.   OMEPRAZOLE (PRILOSEC) 20 MG CAPSULE    TAKE ONE CAPSULE BY MOUTH DAILY   TURMERIC PO    Take 1 tablet by mouth daily.   Modified Medications   No medications on file  Discontinued Medications   No medications on file    Physical Exam:  Vitals:   03/04/22 1139  BP: 128/74  Pulse: 75  Temp: (!) 97.3 F (36.3 C)  TempSrc: Temporal  SpO2: 93%   There is no height or weight on  file to calculate BMI. Wt Readings from Last 3 Encounters:  03/02/21 191 lb (86.6 kg)  02/23/21 170 lb (77.1 kg)  05/29/20 161 lb (73 kg)    Physical Exam Constitutional:      General: She is not in acute distress.    Appearance: She is well-developed. She is not diaphoretic.  HENT:     Head: Normocephalic and atraumatic.     Right Ear: There is impacted cerumen.     Left Ear: There is no impacted cerumen.     Mouth/Throat:     Pharynx: No oropharyngeal exudate.  Eyes:  Conjunctiva/sclera: Conjunctivae normal.     Pupils: Pupils are equal, round, and reactive to light.  Cardiovascular:     Rate and Rhythm: Normal rate and regular rhythm.     Heart sounds: Normal heart sounds.  Pulmonary:     Effort: Pulmonary effort is normal.     Breath sounds: Normal breath sounds.  Abdominal:     General: Bowel sounds are normal.     Palpations: Abdomen is soft.  Musculoskeletal:     Cervical back: Normal range of motion and neck supple.     Right lower leg: No edema.     Left lower leg: No edema.  Skin:    General: Skin is warm and dry.  Neurological:     Mental Status: She is alert.  Psychiatric:        Mood and Affect: Mood normal.     Labs reviewed: Basic Metabolic Panel: Recent Labs    08/31/21 1506  NA 140  K 3.9  CL 103  CO2 30  GLUCOSE 104  BUN 19  CREATININE 0.56*  CALCIUM 8.9   Liver Function Tests: Recent Labs    08/31/21 1506  AST 12  ALT 10  BILITOT 0.5  PROT 6.4   No results for input(s): "LIPASE", "AMYLASE" in the last 8760 hours. No results for input(s): "AMMONIA" in the last 8760 hours. CBC: Recent Labs    03/14/21 1403 08/31/21 1506  WBC 4.9 5.3  NEUTROABS 3,430 3,668  HGB 12.4 14.5  HCT 37.4 44.3  MCV 92.1 92.1  PLT 335 225   Lipid Panel: No results for input(s): "CHOL", "HDL", "LDLCALC", "TRIG", "CHOLHDL", "LDLDIRECT" in the last 8760 hours. TSH: No results for input(s): "TSH" in the last 8760 hours. A1C: Lab Results   Component Value Date   HGBA1C 5.4 06/02/2018     Assessment/Plan 1. Need for influenza vaccination - Flu Vaccine QUAD High Dose(Fluad)  2. PAD (peripheral artery disease) (HCC) Continues on ASA and plavix - Lipid panel - CBC with Differential/Platelet  3. Essential hypertension Blood pressure well controlled, goal bp <140/90 Continue current medications and dietary modifications follow metabolic panel - CMP with eGFR([facility]) - CBC with Differential/Platelet  4. Spastic hemiplegia of right dominant side as late effect of cerebral infarction (HCC) Stable, continues with physical medicine for rehab.  5. OAB (overactive bladder) -stable on myrbetriq  6. Impacted cerumen of left ear Did not tolerate lavage to completely remove wax, will do debrox at home.    Return in about 6 months (around 09/02/2022) for routine follow up- labs at appt .: Janett Billow K. Lof[place], [facility] [phone]

## 2022-03-05 LAB — LIPID PANEL
Cholesterol: 146 mg/dL (ref <200)
HDL: 51 mg/dL (ref >=50)
LDL Cholesterol (Calc): 77 mg/dL (calc)
Non-HDL Cholesterol (Calc): 95 mg/dL (calc) (ref <130)
Total CHOL/HDL Ratio: 2.9 (calc) (ref <5.0)
Triglycerides: 101 mg/dL (ref <150)

## 2022-03-05 LAB — COMPLETE METABOLIC PANEL WITH GFR
AG Ratio: 1.7 (calc) (ref 1.0–2.5)
ALT: 10 U/L (ref 6–29)
AST: 13 U/L (ref 10–35)
Albumin: 4.3 g/dL (ref 3.6–5.1)
Alkaline phosphatase (APISO): 81 U/L (ref 37–153)
BUN/Creatinine Ratio: 26 (calc) — ABNORMAL HIGH (ref 6–22)
BUN: 15 mg/dL (ref 7–25)
CO2: 31 mmol/L (ref 20–32)
Calcium: 9.1 mg/dL (ref 8.6–10.4)
Chloride: 102 mmol/L (ref 98–110)
Creat: 0.57 mg/dL — ABNORMAL LOW (ref 0.60–0.95)
Globulin: 2.5 g/dL (calc) (ref 1.9–3.7)
Glucose, Bld: 116 mg/dL — ABNORMAL HIGH (ref 65–99)
Potassium: 3.7 mmol/L (ref 3.5–5.3)
Sodium: 140 mmol/L (ref 135–146)
Total Bilirubin: 0.6 mg/dL (ref 0.2–1.2)
Total Protein: 6.8 g/dL (ref 6.1–8.1)
eGFR: 90 mL/min/{1.73_m2} (ref >=60)

## 2022-03-05 LAB — CBC WITH DIFFERENTIAL/PLATELET
Absolute Monocytes: 372 cells/uL (ref 200–950)
Basophils Absolute: 41 cells/uL (ref 0–200)
Basophils Relative: 0.8 %
Eosinophils Absolute: 199 cells/uL (ref 15–500)
Eosinophils Relative: 3.9 %
HCT: 44 % (ref 35.0–45.0)
Hemoglobin: 14.7 g/dL (ref 11.7–15.5)
Lymphs Abs: 775 cells/uL — ABNORMAL LOW (ref 850–3900)
MCH: 29.9 pg (ref 27.0–33.0)
MCHC: 33.4 g/dL (ref 32.0–36.0)
MCV: 89.4 fL (ref 80.0–100.0)
MPV: 9.8 fL (ref 7.5–12.5)
Monocytes Relative: 7.3 %
Neutro Abs: 3713 cells/uL (ref 1500–7800)
Neutrophils Relative %: 72.8 %
Platelets: 251 10*3/uL (ref 140–400)
RBC: 4.92 10*6/uL (ref 3.80–5.10)
RDW: 11.7 % (ref 11.0–15.0)
Total Lymphocyte: 15.2 %
WBC: 5.1 10*3/uL (ref 3.8–10.8)

## 2022-03-06 DIAGNOSIS — R2681 Unsteadiness on feet: Secondary | ICD-10-CM | POA: Diagnosis not present

## 2022-03-06 DIAGNOSIS — R2689 Other abnormalities of gait and mobility: Secondary | ICD-10-CM | POA: Diagnosis not present

## 2022-03-06 DIAGNOSIS — M6281 Muscle weakness (generalized): Secondary | ICD-10-CM | POA: Diagnosis not present

## 2022-03-06 DIAGNOSIS — I69398 Other sequelae of cerebral infarction: Secondary | ICD-10-CM | POA: Diagnosis not present

## 2022-03-07 DIAGNOSIS — R2681 Unsteadiness on feet: Secondary | ICD-10-CM | POA: Diagnosis not present

## 2022-03-07 DIAGNOSIS — I69398 Other sequelae of cerebral infarction: Secondary | ICD-10-CM | POA: Diagnosis not present

## 2022-03-07 DIAGNOSIS — M6281 Muscle weakness (generalized): Secondary | ICD-10-CM | POA: Diagnosis not present

## 2022-03-07 DIAGNOSIS — R2689 Other abnormalities of gait and mobility: Secondary | ICD-10-CM | POA: Diagnosis not present

## 2022-03-08 DIAGNOSIS — R2689 Other abnormalities of gait and mobility: Secondary | ICD-10-CM | POA: Diagnosis not present

## 2022-03-08 DIAGNOSIS — I69398 Other sequelae of cerebral infarction: Secondary | ICD-10-CM | POA: Diagnosis not present

## 2022-03-08 DIAGNOSIS — R2681 Unsteadiness on feet: Secondary | ICD-10-CM | POA: Diagnosis not present

## 2022-03-08 DIAGNOSIS — R278 Other lack of coordination: Secondary | ICD-10-CM | POA: Diagnosis not present

## 2022-03-08 DIAGNOSIS — M6281 Muscle weakness (generalized): Secondary | ICD-10-CM | POA: Diagnosis not present

## 2022-03-10 DIAGNOSIS — M6281 Muscle weakness (generalized): Secondary | ICD-10-CM | POA: Diagnosis not present

## 2022-03-10 DIAGNOSIS — I69398 Other sequelae of cerebral infarction: Secondary | ICD-10-CM | POA: Diagnosis not present

## 2022-03-10 DIAGNOSIS — R2681 Unsteadiness on feet: Secondary | ICD-10-CM | POA: Diagnosis not present

## 2022-03-10 DIAGNOSIS — R2689 Other abnormalities of gait and mobility: Secondary | ICD-10-CM | POA: Diagnosis not present

## 2022-03-12 DIAGNOSIS — R2689 Other abnormalities of gait and mobility: Secondary | ICD-10-CM | POA: Diagnosis not present

## 2022-03-12 DIAGNOSIS — M6281 Muscle weakness (generalized): Secondary | ICD-10-CM | POA: Diagnosis not present

## 2022-03-12 DIAGNOSIS — R2681 Unsteadiness on feet: Secondary | ICD-10-CM | POA: Diagnosis not present

## 2022-03-12 DIAGNOSIS — I69398 Other sequelae of cerebral infarction: Secondary | ICD-10-CM | POA: Diagnosis not present

## 2022-03-13 ENCOUNTER — Other Ambulatory Visit: Payer: Self-pay | Admitting: Nurse Practitioner

## 2022-03-13 DIAGNOSIS — R278 Other lack of coordination: Secondary | ICD-10-CM | POA: Diagnosis not present

## 2022-03-13 DIAGNOSIS — M6281 Muscle weakness (generalized): Secondary | ICD-10-CM | POA: Diagnosis not present

## 2022-03-13 DIAGNOSIS — M21371 Foot drop, right foot: Secondary | ICD-10-CM | POA: Diagnosis not present

## 2022-03-13 DIAGNOSIS — I69398 Other sequelae of cerebral infarction: Secondary | ICD-10-CM | POA: Diagnosis not present

## 2022-03-15 DIAGNOSIS — M6281 Muscle weakness (generalized): Secondary | ICD-10-CM | POA: Diagnosis not present

## 2022-03-15 DIAGNOSIS — R2681 Unsteadiness on feet: Secondary | ICD-10-CM | POA: Diagnosis not present

## 2022-03-15 DIAGNOSIS — R2689 Other abnormalities of gait and mobility: Secondary | ICD-10-CM | POA: Diagnosis not present

## 2022-03-15 DIAGNOSIS — I69398 Other sequelae of cerebral infarction: Secondary | ICD-10-CM | POA: Diagnosis not present

## 2022-03-15 DIAGNOSIS — R278 Other lack of coordination: Secondary | ICD-10-CM | POA: Diagnosis not present

## 2022-03-18 DIAGNOSIS — R278 Other lack of coordination: Secondary | ICD-10-CM | POA: Diagnosis not present

## 2022-03-18 DIAGNOSIS — I69398 Other sequelae of cerebral infarction: Secondary | ICD-10-CM | POA: Diagnosis not present

## 2022-03-18 DIAGNOSIS — M6281 Muscle weakness (generalized): Secondary | ICD-10-CM | POA: Diagnosis not present

## 2022-03-19 DIAGNOSIS — R2681 Unsteadiness on feet: Secondary | ICD-10-CM | POA: Diagnosis not present

## 2022-03-19 DIAGNOSIS — M6281 Muscle weakness (generalized): Secondary | ICD-10-CM | POA: Diagnosis not present

## 2022-03-19 DIAGNOSIS — R2689 Other abnormalities of gait and mobility: Secondary | ICD-10-CM | POA: Diagnosis not present

## 2022-03-19 DIAGNOSIS — I69398 Other sequelae of cerebral infarction: Secondary | ICD-10-CM | POA: Diagnosis not present

## 2022-03-20 DIAGNOSIS — R278 Other lack of coordination: Secondary | ICD-10-CM | POA: Diagnosis not present

## 2022-03-20 DIAGNOSIS — I69398 Other sequelae of cerebral infarction: Secondary | ICD-10-CM | POA: Diagnosis not present

## 2022-03-20 DIAGNOSIS — M6281 Muscle weakness (generalized): Secondary | ICD-10-CM | POA: Diagnosis not present

## 2022-03-21 DIAGNOSIS — R2689 Other abnormalities of gait and mobility: Secondary | ICD-10-CM | POA: Diagnosis not present

## 2022-03-21 DIAGNOSIS — R2681 Unsteadiness on feet: Secondary | ICD-10-CM | POA: Diagnosis not present

## 2022-03-21 DIAGNOSIS — I69398 Other sequelae of cerebral infarction: Secondary | ICD-10-CM | POA: Diagnosis not present

## 2022-03-21 DIAGNOSIS — M6281 Muscle weakness (generalized): Secondary | ICD-10-CM | POA: Diagnosis not present

## 2022-03-22 DIAGNOSIS — I69398 Other sequelae of cerebral infarction: Secondary | ICD-10-CM | POA: Diagnosis not present

## 2022-03-22 DIAGNOSIS — R2681 Unsteadiness on feet: Secondary | ICD-10-CM | POA: Diagnosis not present

## 2022-03-22 DIAGNOSIS — R2689 Other abnormalities of gait and mobility: Secondary | ICD-10-CM | POA: Diagnosis not present

## 2022-03-22 DIAGNOSIS — M6281 Muscle weakness (generalized): Secondary | ICD-10-CM | POA: Diagnosis not present

## 2022-03-25 DIAGNOSIS — R278 Other lack of coordination: Secondary | ICD-10-CM | POA: Diagnosis not present

## 2022-03-25 DIAGNOSIS — M6281 Muscle weakness (generalized): Secondary | ICD-10-CM | POA: Diagnosis not present

## 2022-03-25 DIAGNOSIS — R2681 Unsteadiness on feet: Secondary | ICD-10-CM | POA: Diagnosis not present

## 2022-03-25 DIAGNOSIS — I69398 Other sequelae of cerebral infarction: Secondary | ICD-10-CM | POA: Diagnosis not present

## 2022-03-25 DIAGNOSIS — R2689 Other abnormalities of gait and mobility: Secondary | ICD-10-CM | POA: Diagnosis not present

## 2022-03-27 DIAGNOSIS — R2689 Other abnormalities of gait and mobility: Secondary | ICD-10-CM | POA: Diagnosis not present

## 2022-03-27 DIAGNOSIS — R278 Other lack of coordination: Secondary | ICD-10-CM | POA: Diagnosis not present

## 2022-03-27 DIAGNOSIS — R2681 Unsteadiness on feet: Secondary | ICD-10-CM | POA: Diagnosis not present

## 2022-03-27 DIAGNOSIS — M6281 Muscle weakness (generalized): Secondary | ICD-10-CM | POA: Diagnosis not present

## 2022-03-27 DIAGNOSIS — I69398 Other sequelae of cerebral infarction: Secondary | ICD-10-CM | POA: Diagnosis not present

## 2022-03-28 ENCOUNTER — Encounter: Payer: Medicare PPO | Admitting: Nurse Practitioner

## 2022-03-28 DIAGNOSIS — I69398 Other sequelae of cerebral infarction: Secondary | ICD-10-CM | POA: Diagnosis not present

## 2022-03-28 DIAGNOSIS — M6281 Muscle weakness (generalized): Secondary | ICD-10-CM | POA: Diagnosis not present

## 2022-03-28 DIAGNOSIS — R2681 Unsteadiness on feet: Secondary | ICD-10-CM | POA: Diagnosis not present

## 2022-03-28 DIAGNOSIS — R2689 Other abnormalities of gait and mobility: Secondary | ICD-10-CM | POA: Diagnosis not present

## 2022-04-01 DIAGNOSIS — M6281 Muscle weakness (generalized): Secondary | ICD-10-CM | POA: Diagnosis not present

## 2022-04-01 DIAGNOSIS — R278 Other lack of coordination: Secondary | ICD-10-CM | POA: Diagnosis not present

## 2022-04-01 DIAGNOSIS — I69398 Other sequelae of cerebral infarction: Secondary | ICD-10-CM | POA: Diagnosis not present

## 2022-04-02 DIAGNOSIS — I69398 Other sequelae of cerebral infarction: Secondary | ICD-10-CM | POA: Diagnosis not present

## 2022-04-02 DIAGNOSIS — R2681 Unsteadiness on feet: Secondary | ICD-10-CM | POA: Diagnosis not present

## 2022-04-02 DIAGNOSIS — M6281 Muscle weakness (generalized): Secondary | ICD-10-CM | POA: Diagnosis not present

## 2022-04-02 DIAGNOSIS — R2689 Other abnormalities of gait and mobility: Secondary | ICD-10-CM | POA: Diagnosis not present

## 2022-04-03 DIAGNOSIS — I69398 Other sequelae of cerebral infarction: Secondary | ICD-10-CM | POA: Diagnosis not present

## 2022-04-03 DIAGNOSIS — M6281 Muscle weakness (generalized): Secondary | ICD-10-CM | POA: Diagnosis not present

## 2022-04-03 DIAGNOSIS — R278 Other lack of coordination: Secondary | ICD-10-CM | POA: Diagnosis not present

## 2022-04-05 DIAGNOSIS — R2681 Unsteadiness on feet: Secondary | ICD-10-CM | POA: Diagnosis not present

## 2022-04-05 DIAGNOSIS — M6281 Muscle weakness (generalized): Secondary | ICD-10-CM | POA: Diagnosis not present

## 2022-04-05 DIAGNOSIS — I69398 Other sequelae of cerebral infarction: Secondary | ICD-10-CM | POA: Diagnosis not present

## 2022-04-05 DIAGNOSIS — R2689 Other abnormalities of gait and mobility: Secondary | ICD-10-CM | POA: Diagnosis not present

## 2022-04-08 ENCOUNTER — Ambulatory Visit: Payer: Medicare PPO | Admitting: Podiatry

## 2022-04-08 ENCOUNTER — Encounter: Payer: Medicare PPO | Admitting: Nurse Practitioner

## 2022-04-08 ENCOUNTER — Encounter: Payer: Self-pay | Admitting: Podiatry

## 2022-04-08 VITALS — BP 140/73

## 2022-04-08 DIAGNOSIS — M79675 Pain in left toe(s): Secondary | ICD-10-CM | POA: Diagnosis not present

## 2022-04-08 DIAGNOSIS — L84 Corns and callosities: Secondary | ICD-10-CM | POA: Diagnosis not present

## 2022-04-08 DIAGNOSIS — M79674 Pain in right toe(s): Secondary | ICD-10-CM | POA: Diagnosis not present

## 2022-04-08 DIAGNOSIS — B351 Tinea unguium: Secondary | ICD-10-CM

## 2022-04-08 DIAGNOSIS — G5791 Unspecified mononeuropathy of right lower limb: Secondary | ICD-10-CM | POA: Diagnosis not present

## 2022-04-08 DIAGNOSIS — I69398 Other sequelae of cerebral infarction: Secondary | ICD-10-CM | POA: Diagnosis not present

## 2022-04-08 DIAGNOSIS — R2689 Other abnormalities of gait and mobility: Secondary | ICD-10-CM | POA: Diagnosis not present

## 2022-04-08 DIAGNOSIS — M6281 Muscle weakness (generalized): Secondary | ICD-10-CM | POA: Diagnosis not present

## 2022-04-08 DIAGNOSIS — R2681 Unsteadiness on feet: Secondary | ICD-10-CM | POA: Diagnosis not present

## 2022-04-09 DIAGNOSIS — M6281 Muscle weakness (generalized): Secondary | ICD-10-CM | POA: Diagnosis not present

## 2022-04-09 DIAGNOSIS — R2689 Other abnormalities of gait and mobility: Secondary | ICD-10-CM | POA: Diagnosis not present

## 2022-04-09 DIAGNOSIS — I69398 Other sequelae of cerebral infarction: Secondary | ICD-10-CM | POA: Diagnosis not present

## 2022-04-09 DIAGNOSIS — R2681 Unsteadiness on feet: Secondary | ICD-10-CM | POA: Diagnosis not present

## 2022-04-09 DIAGNOSIS — R278 Other lack of coordination: Secondary | ICD-10-CM | POA: Diagnosis not present

## 2022-04-10 NOTE — Progress Notes (Signed)
  Subjective:  Patient ID: Kristin [name], female    DOB: 07-21-1940,  MRN: [id number]  Kristin [name] presents to clinic today for at risk foot care with history of peripheral neuropathy and corn(s) b/l lower extremities and painful thick toenails that are difficult to trim. Painful toenails interfere with ambulation. Aggravating factors include wearing enclosed shoe gear. Pain is relieved with periodic professional debridement. Painful corns are aggravated when weightbearing when wearing enclosed shoe gear. Pain is relieved with periodic professional debridement.   She is accompanied by her daughter on today's visit.  Chief Complaint  Patient presents with   Nail Problem    RFC PCP-Eubanks, Jessica PCP VST-02/2022   New problem(s): None.   PCP is Lauree Chandler, NP.  No Known Allergies  Review of Systems: Negative except as noted in the HPI.  Objective: No changes noted in today's physical examination. Vitals:   04/08/22 1402  BP: (!) 140/73   Kristin [name] is a pleasant 81 y.o. female WD, WN in NAD. AAO x 3. Vascular CFT immediate b/l LE. Palpable DP/PT pulses b/l LE. Digital hair absent b/l. Skin temperature gradient WNL b/l. No pain with calf compression b/l. No edema noted b/l. No cyanosis or clubbing noted b/l LE.  Neurologic Oriented to person, place, and time. Protective sensation intact with 10 gram monofilament left lower extremity. Protective sensation decreased with 10 gram monofilament right lower extremity.  Dermatologic Pedal integument with normal turgor, texture and tone b/l LE. No open wounds b/l. No interdigital macerations b/l. Toenails 1-5 b/l elongated, thickened, discolored with subungual debris. +Tenderness with dorsal palpation of nailplates. Hyperkeratotic lesion(s) distal tip of 2nd toe left foot.  No erythema, no edema, no drainage, no fluctuance.  Orthopedic: Dropfoot right lower extremity. AFO RLE. Muscle strength 5/5 to all LE muscle groups of left lower  extremity. No pain, crepitus or joint limitation noted with ROM bilateral LE. Clawtoe deformity 1-5 right.   Radiographs: None  Assessment/Plan: 1. Pain due to onychomycosis of toenails of both feet   2. Corns   3. Neuropathy of right lower extremity     No orders of the defined types were placed in this encounter.  -Patient's family member present. All questions/concerns addressed on today's visit. -Toenails 1-5 bilaterally were debrided in length and girth with sterile nail nippers and dremel. Pinpoint bleeding of L 3rd toe addressed with Lumicain Hemostatic Solution, cleansed with alcohol. No further treatment required by patient/caregiver. -Toenails distal tip of left 2nd toe debrided in length and girth with sterile nail nippers and dremel tool without incident. -Continue soft, supportive shoe gear daily -Report any pedal problems to medical professional immediately. -Patient/POA to contact office if there are any problems in the interim.  FOLLOW UP:  Return in about 3 months (around 07/08/2022).  Marzetta Board, DPM   -Continue toe caps to symptomatic hammertoes daily. -Patient/POA to call should there be question/concern in the interim.   Return in about 3 months (around 07/08/2022).  Marzetta Board, DPM

## 2022-04-11 DIAGNOSIS — M6281 Muscle weakness (generalized): Secondary | ICD-10-CM | POA: Diagnosis not present

## 2022-04-11 DIAGNOSIS — R278 Other lack of coordination: Secondary | ICD-10-CM | POA: Diagnosis not present

## 2022-04-11 DIAGNOSIS — I69398 Other sequelae of cerebral infarction: Secondary | ICD-10-CM | POA: Diagnosis not present

## 2022-04-12 DIAGNOSIS — R2689 Other abnormalities of gait and mobility: Secondary | ICD-10-CM | POA: Diagnosis not present

## 2022-04-12 DIAGNOSIS — R2681 Unsteadiness on feet: Secondary | ICD-10-CM | POA: Diagnosis not present

## 2022-04-12 DIAGNOSIS — M6281 Muscle weakness (generalized): Secondary | ICD-10-CM | POA: Diagnosis not present

## 2022-04-12 DIAGNOSIS — I69398 Other sequelae of cerebral infarction: Secondary | ICD-10-CM | POA: Diagnosis not present

## 2022-04-16 DIAGNOSIS — M6281 Muscle weakness (generalized): Secondary | ICD-10-CM | POA: Diagnosis not present

## 2022-04-16 DIAGNOSIS — R278 Other lack of coordination: Secondary | ICD-10-CM | POA: Diagnosis not present

## 2022-04-16 DIAGNOSIS — I69398 Other sequelae of cerebral infarction: Secondary | ICD-10-CM | POA: Diagnosis not present

## 2022-04-17 DIAGNOSIS — I69398 Other sequelae of cerebral infarction: Secondary | ICD-10-CM | POA: Diagnosis not present

## 2022-04-17 DIAGNOSIS — M6281 Muscle weakness (generalized): Secondary | ICD-10-CM | POA: Diagnosis not present

## 2022-04-17 DIAGNOSIS — R2681 Unsteadiness on feet: Secondary | ICD-10-CM | POA: Diagnosis not present

## 2022-04-17 DIAGNOSIS — R2689 Other abnormalities of gait and mobility: Secondary | ICD-10-CM | POA: Diagnosis not present

## 2022-04-18 DIAGNOSIS — I69398 Other sequelae of cerebral infarction: Secondary | ICD-10-CM | POA: Diagnosis not present

## 2022-04-18 DIAGNOSIS — R2689 Other abnormalities of gait and mobility: Secondary | ICD-10-CM | POA: Diagnosis not present

## 2022-04-18 DIAGNOSIS — R2681 Unsteadiness on feet: Secondary | ICD-10-CM | POA: Diagnosis not present

## 2022-04-18 DIAGNOSIS — M6281 Muscle weakness (generalized): Secondary | ICD-10-CM | POA: Diagnosis not present

## 2022-04-19 DIAGNOSIS — R2689 Other abnormalities of gait and mobility: Secondary | ICD-10-CM | POA: Diagnosis not present

## 2022-04-19 DIAGNOSIS — I69398 Other sequelae of cerebral infarction: Secondary | ICD-10-CM | POA: Diagnosis not present

## 2022-04-19 DIAGNOSIS — R2681 Unsteadiness on feet: Secondary | ICD-10-CM | POA: Diagnosis not present

## 2022-04-19 DIAGNOSIS — M6281 Muscle weakness (generalized): Secondary | ICD-10-CM | POA: Diagnosis not present

## 2022-04-23 DIAGNOSIS — R278 Other lack of coordination: Secondary | ICD-10-CM | POA: Diagnosis not present

## 2022-04-23 DIAGNOSIS — M6281 Muscle weakness (generalized): Secondary | ICD-10-CM | POA: Diagnosis not present

## 2022-04-23 DIAGNOSIS — I69398 Other sequelae of cerebral infarction: Secondary | ICD-10-CM | POA: Diagnosis not present

## 2022-04-24 DIAGNOSIS — I69398 Other sequelae of cerebral infarction: Secondary | ICD-10-CM | POA: Diagnosis not present

## 2022-04-24 DIAGNOSIS — R2689 Other abnormalities of gait and mobility: Secondary | ICD-10-CM | POA: Diagnosis not present

## 2022-04-24 DIAGNOSIS — R2681 Unsteadiness on feet: Secondary | ICD-10-CM | POA: Diagnosis not present

## 2022-04-24 DIAGNOSIS — M6281 Muscle weakness (generalized): Secondary | ICD-10-CM | POA: Diagnosis not present

## 2022-04-26 DIAGNOSIS — R278 Other lack of coordination: Secondary | ICD-10-CM | POA: Diagnosis not present

## 2022-04-26 DIAGNOSIS — M6281 Muscle weakness (generalized): Secondary | ICD-10-CM | POA: Diagnosis not present

## 2022-04-26 DIAGNOSIS — R2689 Other abnormalities of gait and mobility: Secondary | ICD-10-CM | POA: Diagnosis not present

## 2022-04-26 DIAGNOSIS — I69398 Other sequelae of cerebral infarction: Secondary | ICD-10-CM | POA: Diagnosis not present

## 2022-04-26 DIAGNOSIS — R2681 Unsteadiness on feet: Secondary | ICD-10-CM | POA: Diagnosis not present

## 2022-04-29 DIAGNOSIS — R2689 Other abnormalities of gait and mobility: Secondary | ICD-10-CM | POA: Diagnosis not present

## 2022-04-29 DIAGNOSIS — R2681 Unsteadiness on feet: Secondary | ICD-10-CM | POA: Diagnosis not present

## 2022-04-29 DIAGNOSIS — M6281 Muscle weakness (generalized): Secondary | ICD-10-CM | POA: Diagnosis not present

## 2022-04-29 DIAGNOSIS — I69398 Other sequelae of cerebral infarction: Secondary | ICD-10-CM | POA: Diagnosis not present

## 2022-04-30 DIAGNOSIS — M6281 Muscle weakness (generalized): Secondary | ICD-10-CM | POA: Diagnosis not present

## 2022-04-30 DIAGNOSIS — R278 Other lack of coordination: Secondary | ICD-10-CM | POA: Diagnosis not present

## 2022-04-30 DIAGNOSIS — I69398 Other sequelae of cerebral infarction: Secondary | ICD-10-CM | POA: Diagnosis not present

## 2022-05-01 DIAGNOSIS — M6281 Muscle weakness (generalized): Secondary | ICD-10-CM | POA: Diagnosis not present

## 2022-05-01 DIAGNOSIS — R2689 Other abnormalities of gait and mobility: Secondary | ICD-10-CM | POA: Diagnosis not present

## 2022-05-01 DIAGNOSIS — I69398 Other sequelae of cerebral infarction: Secondary | ICD-10-CM | POA: Diagnosis not present

## 2022-05-01 DIAGNOSIS — R2681 Unsteadiness on feet: Secondary | ICD-10-CM | POA: Diagnosis not present

## 2022-05-02 DIAGNOSIS — M6281 Muscle weakness (generalized): Secondary | ICD-10-CM | POA: Diagnosis not present

## 2022-05-02 DIAGNOSIS — R278 Other lack of coordination: Secondary | ICD-10-CM | POA: Diagnosis not present

## 2022-05-02 DIAGNOSIS — I69398 Other sequelae of cerebral infarction: Secondary | ICD-10-CM | POA: Diagnosis not present

## 2022-05-03 DIAGNOSIS — I69398 Other sequelae of cerebral infarction: Secondary | ICD-10-CM | POA: Diagnosis not present

## 2022-05-03 DIAGNOSIS — R278 Other lack of coordination: Secondary | ICD-10-CM | POA: Diagnosis not present

## 2022-05-03 DIAGNOSIS — M6281 Muscle weakness (generalized): Secondary | ICD-10-CM | POA: Diagnosis not present

## 2022-05-06 DIAGNOSIS — M6281 Muscle weakness (generalized): Secondary | ICD-10-CM | POA: Diagnosis not present

## 2022-05-06 DIAGNOSIS — R278 Other lack of coordination: Secondary | ICD-10-CM | POA: Diagnosis not present

## 2022-05-06 DIAGNOSIS — I69398 Other sequelae of cerebral infarction: Secondary | ICD-10-CM | POA: Diagnosis not present

## 2022-05-07 ENCOUNTER — Ambulatory Visit (INDEPENDENT_AMBULATORY_CARE_PROVIDER_SITE_OTHER): Payer: Medicare PPO | Admitting: Nurse Practitioner

## 2022-05-07 ENCOUNTER — Encounter: Payer: Self-pay | Admitting: Nurse Practitioner

## 2022-05-07 ENCOUNTER — Telehealth: Payer: Self-pay

## 2022-05-07 DIAGNOSIS — Z0000 Encounter for general adult medical examination without abnormal findings: Secondary | ICD-10-CM

## 2022-05-07 DIAGNOSIS — M6281 Muscle weakness (generalized): Secondary | ICD-10-CM | POA: Diagnosis not present

## 2022-05-07 DIAGNOSIS — R2689 Other abnormalities of gait and mobility: Secondary | ICD-10-CM | POA: Diagnosis not present

## 2022-05-07 DIAGNOSIS — I69398 Other sequelae of cerebral infarction: Secondary | ICD-10-CM | POA: Diagnosis not present

## 2022-05-07 DIAGNOSIS — R2681 Unsteadiness on feet: Secondary | ICD-10-CM | POA: Diagnosis not present

## 2022-05-07 NOTE — Telephone Encounter (Signed)
Kristin [name], Kristin [name] are scheduled for a virtual visit with your provider today.    Just as we do with appointments in the office, we must obtain your consent to participate.  Your consent will be active for this visit and any virtual visit you may have with one of our providers in the next 365 days.    If you have a MyChart account, I can also send a copy of this consent to you electronically.  All virtual visits are billed to your insurance company just like a traditional visit in the office.  As this is a virtual visit, video technology does not allow for your provider to perform a traditional examination.  This may limit your provider's ability to fully assess your condition.  If your provider identifies any concerns that need to be evaluated in person or the need to arrange testing such as labs, EKG, etc, we will make arrangements to do so.    Although advances in technology are sophisticated, we cannot ensure that it will always work on either your end or our end.  If the connection with a video visit is poor, we may have to switch to a telephone visit.  With either a video or telephone visit, we are not always able to ensure that we have a secure connection.   I need to obtain your verbal consent now.   Are you willing to proceed with your visit today?   Kristin [name] has provided verbal consent on 05/07/2022 for a virtual visit (video or telephone).   Carroll Kinds, [facility] 05/07/2022  8:31 AM

## 2022-05-07 NOTE — Progress Notes (Signed)
This service is provided via telemedicine  No vital signs collected/recorded due to the encounter was a telemedicine visit.   Location of patient (ex: home, work):  Home  Patient consents to a telephone visit:  Yes, see encounter dated 05/07/2022  Location of the provider (ex: office, home):  [place]  Name of any referring provider:  N/A  Names of all persons participating in the telemedicine service and their role in the encounter:  Sherrie Mustache, Nurse Practitioner, Carroll Kinds, CMA, patient and patient's daughter, [name].  Time spent on call:  7 minutes with medical assistant

## 2022-05-07 NOTE — Progress Notes (Signed)
Subjective:   Kristin [name] is a 82 y.o. female who presents for Medicare Annual (Subsequent) preventive examination.  Review of Systems     Cardiac Risk Factors include: advanced age (>45mn, >>33women);hypertension;sedentary lifestyle;dyslipidemia     Objective:    There were no vitals filed for this visit. There is no height or weight on file to calculate BMI.     05/07/2022    8:25 AM 03/04/2022   11:36 AM 08/31/2021   11:35 AM 03/08/2021    1:08 PM 03/02/2021   11:38 AM 02/23/2021    1:32 AM 05/29/2020    2:24 PM  Advanced Directives  Does Patient Have a Medical Advance Directive? Yes Yes Yes Yes Yes Yes Yes  Type of Advance Directive Out of facility DNR (pink MOST or yellow form) Out of facility DNR (pink MOST or yellow form) Out of facility DNR (pink MOST or yellow form) Out of facility DNR (pink MOST or yellow form) Out of facility DNR (pink MOST or yellow form) [facility] Out of facility D[place] (pink MOST or yellow form);Living will  Does patient want to make changes to medical advance directive? No - Patient declined No - Patient declined No - Patient declined No - Patient declined No - Patient declined No - Patient declined No - Patient declined  [place]?      No - copy requested   Pre-existing out of facility DNR order (yellow form or pink MOST form) Pink MOST form placed in chart (order not valid for inpatient use) Pink MOST form placed in chart (order not valid for inpatient use) Pink MOST form placed in chart (order not valid for inpatient use) Pink MOST form placed in chart (order not valid for inpatient use) Pink MOST form placed in chart (order not valid for inpatient use)  Pink MOST form placed in chart (order not valid for inpatient use)    Current Medications (verified) Outpatient Encounter Medications as of 05/07/2022  Medication Sig   aspirin 81 MG tablet Take 81 mg by mouth daily.   atorvastatin  (LIPITOR) 40 MG tablet tAKE ONE TABLET BY MOUTH DAILY.   Cholecalciferol (VITAMIN D3) 50 MCG (2000 UT) capsule Take 2,000 Units by mouth daily.   clopidogrel (PLAVIX) 75 MG tablet TAKE ONE TABLET BY MOUTH DAILY   Coenzyme Q10 (COQ-10 PO) Take 1 tablet daily by mouth.   desmopressin (DDAVP) 0.2 MG tablet Take 0.4 mg by mouth at bedtime.   diclofenac Sodium (VOLTAREN) 1 % GEL APPLY TO THE AFFECTED AREA(S) THREE TIMES DAILY.   docusate sodium (COLACE) 100 MG capsule Take 100 mg by mouth daily.   DULoxetine (CYMBALTA) 20 MG capsule TAKE ONE CAPSULE BY MOUTH DAILY   estradiol (ESTRACE) 0.1 MG/GM vaginal cream    gabapentin (NEURONTIN) 300 MG capsule TAKE ONE CAPSULE BY MOUTH THREE TIMES DAILY   hydrocortisone (ANUSOL-HC) 2.5 % rectal cream Place 1 Application rectally 2 (two) times daily.   methocarbamol (ROBAXIN) 500 MG tablet Take 1 tablet (500 mg total) by mouth every 8 (eight) hours as needed for muscle spasms.   mirabegron ER (MYRBETRIQ) 50 MG TB24 tablet Take 1 tablet by mouth daily.   nitroGLYCERIN (NITROSTAT) 0.4 MG SL tablet Place 1 tablet (0.4 mg total) under the tongue every 5 (five) minutes as needed for chest pain.   omeprazole (PRILOSEC) 20 MG capsule TAKE ONE CAPSULE BY MOUTH DAILY   trimethoprim (TRIMPEX) 100 MG tablet Take 100  mg by mouth daily.   TURMERIC PO Take 1 tablet by mouth daily.    No facility-administered encounter medications on file as of 05/07/2022.    Allergies (verified) Patient has no known allergies.   History: Past Medical History:  Diagnosis Date   Arthritis    hands   CAD (coronary artery disease)    a. 2010: stents pRCA and dRCA. b. LHC 12/13/13: Minimal distal LM dz. LAD 95% ostial w/ L-->R collat. LCx mild dz. RCA 95% mid-dRCA. Patent stents proximal and distal RCA. EF not assessed, Nl by nuclear study.      Central retinal vein occlusion, right eye    Chronic kidney disease    hx kidney stone   Depression    Diplopia    Ear pain    right    Eosinophilic esophagitis    Essential hypertension    GERD (gastroesophageal reflux disease)    H/O: whooping cough    Hyperlipidemia with target LDL less than 70    Incontinence    urine   Stroke ([place]) 2001   reesultant Rt spastic hemplegia and aphasia    Past Surgical History:  Procedure Laterality Date   ABDOMINAL HYSTERECTOMY     CAROTID STENT     cataracts     COLONOSCOPY N/A 10/05/2015   Procedure: COLONOSCOPY;  Surgeon: Manus Gunning, MD;  Location: [facility];  Service: Gastroenterology;  Laterality: N/A;   CORONARY ARTERY BYPASS GRAFT N/A 12/17/2013   Procedure: CORONARY ARTERY BYPASS GRAFTING (CABG);  Surgeon: Gaye Pollack, MD;  Location: [facility];  Service: Open Heart Surgery;  Laterality: N/A;  Times 2 using left internal mammary artery and endoscopically harvested right saphenous vein   ESOPHAGOGASTRODUODENOSCOPY     ESOPHAGOGASTRODUODENOSCOPY N/A 10/05/2015   Procedure: ESOPHAGOGASTRODUODENOSCOPY (EGD);  Surgeon: Manus Gunning, MD;  Location: [facility];  Service: Gastroenterology;  Laterality: N/A;   INTRAOPERATIVE TRANSESOPHAGEAL ECHOCARDIOGRAM N/A 12/17/2013   Procedure: INTRAOPERATIVE TRANSESOPHAGEAL ECHOCARDIOGRAM;  Surgeon: Gaye Pollack, MD;  Location: [facility] OR;  Service: Open Heart Surgery;  Laterality: N/A;   lasix     LEFT HEART CATHETERIZATION WITH CORONARY ANGIOGRAM N/A 12/13/2013   Procedure: LEFT HEART CATHETERIZATION WITH CORONARY ANGIOGRAM;  Surgeon: Jettie Booze, MD;  Location: [facility];  Service: Cardiovascular;  Laterality: N/A;   retina injection     Family History  Problem Relation Age of Onset   Diabetes Mother    Heart disease Mother    Heart attack Father    Breast cancer Sister    Social History   Socioeconomic History   Marital status: Married    Spouse name: Not on file   Number of children: 2   Years of education: Not on file   Highest education level: Not on file  Occupational History   Occupation: retired  Education officer, museum  Tobacco Use   Smoking status: Never   Smokeless tobacco: Never  Vaping Use   Vaping Use: Never used  Substance and Sexual Activity   Alcohol use: Yes    Comment: rarely   Drug use: No   Sexual activity: Never  Other Topics Concern   Not on file  Social History Narrative   Not on file   Social Determinants of Health   Financial Resource Strain: Not on file  Food Insecurity: Not on file  Transportation Needs: Not on file  Physical Activity: Not on file  Stress: Not on file  Social Connections: Not on file    Tobacco Counseling  Counseling given: Not Answered   Clinical Intake:  Pre-visit preparation completed: Yes  Pain : No/denies pain     Nutritional Risks: None  How often do you need to have someone help you when you read instructions, pamphlets, or other written materials from your doctor or pharmacy?: 1 - Never  [place] [facility] Daily Living    05/07/2022    8:52 AM  In your present state of health, do you have any difficulty performing the following activities:  Hearing? 0  Vision? 0  Difficulty concentrating or making decisions? 0  Walking or climbing stairs? 1  Dressing or bathing? 0  Doing errands, shopping? 1  Preparing Food and eating ? N  Using the Toilet? N  In the past six months, have you accidently leaked urine? Y  Do you have problems with loss of bowel control? N  Managing your Medications? Y  Managing your Finances? Y  Housekeeping or managing your Housekeeping? Y    Patient Care Team: Lauree Chandler, NP as PCP - General (Geriatric Medicine) Armbruster, Carlota Raspberry, MD as Consulting Physician (Gastroenterology) Meredith Staggers, MD as Consulting Physician (Physical Medicine and Rehabilitation) Register, Luetta Nutting, PA-C as Physician Assistant (Dermatology) Marzetta Board, DPM as Consulting Physician (Podiatry) Hayden Pedro, MD as Consulting Physician (Ophthalmology) Domingo Pulse, MD  (Urology)  Indicate any recent Medical Services you may have received from other than [facility] providers in the past year (date may be approximate).     Assessment:   This is a routine wellness examination for Syniyah.  Hearing/Vision screen Hearing Screening - Comments:: Patient has no hearing problems Vision Screening - Comments:: Patient has no vision problems. Patient had eye exam about 3 months ago. She sees Dr. Zigmund Daniel.  Dietary issues and exercise activities discussed: Current Exercise Habits: Home exercise routine, Type of exercise: stretching   Goals Addressed   None    Depression Screen    05/07/2022    8:24 AM 03/04/2022    1:04 PM 05/08/2021   11:10 AM 03/08/2021    1:05 PM 10/27/2020    2:15 PM 05/29/2020    2:24 PM 10/21/2019    9:05 AM  PHQ 2/9 Scores  PHQ - 2 Score 0 0 0 0 2 0 0    Fall Risk    05/07/2022    8:25 AM 03/04/2022    1:03 PM 08/31/2021    2:39 PM 05/08/2021   11:10 AM 03/08/2021    1:06 PM  [place] in the past year? 0 0 0 0 0  Number falls in past yr: 0 0 0 0 0  Injury with Fall? 0 0 0 0 0  Risk for fall due to : No Fall Risks No Fall Risks No Fall Risks  No Fall Risks  Follow up Falls evaluation completed Falls evaluation completed Falls evaluation completed  Falls evaluation completed    [place]:  Any stairs in or around the home? No  If so, are there any without handrails? No  Home free of loose throw rugs in walkways, pet beds, electrical cords, etc? Yes  Adequate lighting in your home to reduce risk of falls? Yes   ASSISTIVE DEVICES UTILIZED TO PREVENT FALLS:  Life alert? Yes  Use of a cane, walker or w/c? Yes  Grab bars in the bathroom? Yes  Shower chair or bench in shower? Yes  Elevated toilet seat  or a handicapped toilet? Yes   TIMED UP AND GO:  Was the test performed? No .    Cognitive Function:        Immunizations Immunization History  Administered Date(s) Administered    Fluad Quad(high Dose 65+) 03/04/2022   Influenza,inj,Quad PF,6+ Mos 01/27/2018   Influenza-Unspecified 01/21/2012, 02/05/2013, 02/21/2014, 01/26/2015, 01/23/2016, 01/23/2017, 01/13/2018, 01/21/2019   Moderna Sars-Covid-2 Vaccination 05/13/2019, 06/10/2019, 02/21/2020, 08/31/2020   Pneumococcal Polysaccharide-23 05/07/2012, 05/12/2013   Td 05/12/2013   Zoster Recombinat (Shingrix) 06/09/2020, 04/08/2022   Zoster, Live 05/07/2012    TDAP status: Up to date  Flu Vaccine status: Up to date  Pneumococcal vaccine status: Up to date  Covid-19 vaccine status: Information provided on how to obtain vaccines.   Qualifies for Shingles Vaccine? Yes   Zostavax completed No   Shingrix Completed?: Yes  Screening Tests Health Maintenance  Topic Date Due   Pneumonia Vaccine 74+ Years old (2 - PCV) 05/12/2014   COVID-19 Vaccine (5 - 2023-24 season) 12/21/2021   Medicare Annual Wellness (AWV)  05/08/2023   DTaP/Tdap/Td (2 - Tdap) 05/13/2023   INFLUENZA VACCINE  Completed   Zoster Vaccines- Shingrix  Completed   HPV VACCINES  Aged Out    Health Maintenance  Health Maintenance Due  Topic Date Due   Pneumonia Vaccine 48+ Years old (2 - PCV) 05/12/2014   COVID-19 Vaccine (5 - 2023-24 season) 12/21/2021    Colorectal cancer screening: No longer required.   Mammogram status: No longer required due to age.  Not candidate for bone density   Lung Cancer Screening: (Low Dose CT Chest recommended if Age 80-80 years, 30 pack-year currently smoking OR have quit w/in 15years.) does not qualify.   Lung Cancer Screening Referral: na  Additional Screening:  Hepatitis C Screening: does not qualify; Completed  Vision Screening: Recommended annual ophthalmology exams for early detection of glaucoma and other disorders of the eye. Is the patient up to date with their annual eye exam?  Yes  Who is the provider or what is the name of the office in which the patient attends annual eye exams? Dr  Zigmund Daniel If pt is not established with a provider, would they like to be referred to a provider to establish care? No .   Dental Screening: Recommended annual dental exams for proper oral hygiene  Community Resource Referral / Chronic Care Management: CRR required this visit?  No   CCM required this visit?  No      Plan:     I have personally reviewed and noted the following in the patient's chart:   Medical and social history Use of alcohol, tobacco or illicit drugs  Current medications and supplements including opioid prescriptions. Patient is not currently taking opioid prescriptions. Functional ability and status Nutritional status Physical activity Advanced directives List of other physicians Hospitalizations, surgeries, and ER visits in previous 12 months Vitals Screenings to include cognitive, depression, and falls Referrals and appointments  In addition, I have reviewed and discussed with patient certain preventive protocols, quality metrics, and best practice recommendations. A written personalized care plan for preventive services as well as general preventive health recommendations were provided to patient.     Lauree Chandler, NP   05/07/2022   Virtual Visit via Video Note  I connected with Carrington Clamp on 05/07/22 at  8:20 AM EST by a video enabled telemedicine application and verified that I am speaking with the correct person using two identifiers.  Location: Patient: home Provider: [facility].  I discussed the limitations of evaluation and management by telemedicine and the availability of in person appointments. The patient expressed understanding and agreed to proceed.    I discussed the assessment and treatment plan with the patient. The patient was provided an opportunity to ask questions and all were answered. The patient agreed with the plan and demonstrated an understanding of the instructions.   The patient was advised to call back or seek an  in-person evaluation if the symptoms worsen or if the condition fails to improve as anticipated.  I provided 15 minutes of non-face-to-face time during this encounter.  [employer]. Dewaine Oats, AGNP Avs printed and mailed.

## 2022-05-07 NOTE — Patient Instructions (Signed)
[name] , Thank you for taking time to come for your Medicare Wellness Visit. I appreciate your ongoing commitment to your health goals. Please review the following plan we discussed and let me know if I can assist you in the future.   Screening recommendations/referrals: Colonoscopy aged out Mammogram aged out Bone Density declines Recommended yearly ophthalmology/optometry visit for glaucoma screening and checkup Recommended yearly dental visit for hygiene and checkup  Vaccinations: Influenza vaccine- due annually in September/October Pneumococcal vaccine up to date Tdap vaccine up to date Shingles vaccine up to date    Advanced directives: on file.  Conditions/risks identified: advance age, fall risk  Next appointment: yearly    Preventive Care 52 Years and Older, Female Preventive care refers to lifestyle choices and visits with your health care provider that can promote health and wellness. What does preventive care include? A yearly physical exam. This is also called an annual well check. Dental exams once or twice a year. Routine eye exams. Ask your health care provider how often you should have your eyes checked. Personal lifestyle choices, including: Daily care of your teeth and gums. Regular physical activity. Eating a healthy diet. Avoiding tobacco and drug use. Limiting alcohol use. Practicing safe sex. Taking low-dose aspirin every day. Taking vitamin and mineral supplements as recommended by your health care provider. What happens during an annual well check? The services and screenings done by your health care provider during your annual well check will depend on your age, overall health, lifestyle risk factors, and family history of disease. Counseling  Your health care provider may ask you questions about your: Alcohol use. Tobacco use. Drug use. Emotional well-being. Home and relationship well-being. Sexual activity. Eating habits. History of  falls. Memory and ability to understand (cognition). Work and work Statistician. Reproductive health. Screening  You may have the following tests or measurements: Height, weight, and BMI. Blood pressure. Lipid and cholesterol levels. These may be checked every 5 years, or more frequently if you are over 38 years old. Skin check. Lung cancer screening. You may have this screening every year starting at age 73 if you have a 30-pack-year history of smoking and currently smoke or have quit within the past 15 years. Fecal occult blood test (FOBT) of the stool. You may have this test every year starting at age 64. Flexible sigmoidoscopy or colonoscopy. You may have a sigmoidoscopy every 5 years or a colonoscopy every 10 years starting at age 6. Hepatitis C blood test. Hepatitis B blood test. Sexually transmitted disease (STD) testing. Diabetes screening. This is done by checking your blood sugar (glucose) after you have not eaten for a while (fasting). You may have this done every 1-3 years. Bone density scan. This is done to screen for osteoporosis. You may have this done starting at age 33. Mammogram. This may be done every 1-2 years. Talk to your health care provider about how often you should have regular mammograms. Talk with your health care provider about your test results, treatment options, and if necessary, the need for more tests. Vaccines  Your health care provider may recommend certain vaccines, such as: Influenza vaccine. This is recommended every year. Tetanus, diphtheria, and acellular pertussis (Tdap, Td) vaccine. You may need a Td booster every 10 years. Zoster vaccine. You may need this after age 21. Pneumococcal 13-valent conjugate (PCV13) vaccine. One dose is recommended after age 61. Pneumococcal polysaccharide (PPSV23) vaccine. One dose is recommended after age 55. Talk to your health care provider about  which screenings and vaccines you need and how often you need  them. This information is not intended to replace advice given to you by your health care provider. Make sure you discuss any questions you have with your health care provider. Document Released: 05/05/2015 Document Revised: 12/27/2015 Document Reviewed: 02/07/2015 Elsevier Interactive Patient Education  2017 [place] Prevention in the Home Falls can cause injuries. They can happen to people of all ages. There are many things you can do to make your home safe and to help prevent falls. What can I do on the outside of my home? Regularly fix the edges of walkways and driveways and fix any cracks. Remove anything that might make you trip as you walk through a door, such as a raised step or threshold. Trim any bushes or trees on the path to your home. Use bright outdoor lighting. Clear any walking paths of anything that might make someone trip, such as rocks or tools. Regularly check to see if handrails are loose or broken. Make sure that both sides of any steps have handrails. Any raised decks and porches should have guardrails on the edges. Have any leaves, snow, or ice cleared regularly. Use sand or salt on walking paths during winter. Clean up any spills in your garage right away. This includes oil or grease spills. What can I do in the bathroom? Use night lights. Install grab bars by the toilet and in the tub and shower. Do not use towel bars as grab bars. Use non-skid mats or decals in the tub or shower. If you need to sit down in the shower, use a plastic, non-slip stool. Keep the floor dry. Clean up any water that spills on the floor as soon as it happens. Remove soap buildup in the tub or shower regularly. Attach bath mats securely with double-sided non-slip rug tape. Do not have throw rugs and other things on the floor that can make you trip. What can I do in the bedroom? Use night lights. Make sure that you have a light by your bed that is easy to reach. Do not use  any sheets or blankets that are too big for your bed. They should not hang down onto the floor. Have a firm chair that has side arms. You can use this for support while you get dressed. Do not have throw rugs and other things on the floor that can make you trip. What can I do in the kitchen? Clean up any spills right away. Avoid walking on wet floors. Keep items that you use a lot in easy-to-reach places. If you need to reach something above you, use a strong step stool that has a grab bar. Keep electrical cords out of the way. Do not use floor polish or wax that makes floors slippery. If you must use wax, use non-skid floor wax. Do not have throw rugs and other things on the floor that can make you trip. What can I do with my stairs? Do not leave any items on the stairs. Make sure that there are handrails on both sides of the stairs and use them. Fix handrails that are broken or loose. Make sure that handrails are as long as the stairways. Check any carpeting to make sure that it is firmly attached to the stairs. Fix any carpet that is loose or worn. Avoid having throw rugs at the top or bottom of the stairs. If you do have throw rugs, attach them to the floor with  carpet tape. Make sure that you have a light switch at the top of the stairs and the bottom of the stairs. If you do not have them, ask someone to add them for you. What else can I do to help prevent falls? Wear shoes that: Do not have high heels. Have rubber bottoms. Are comfortable and fit you well. Are closed at the toe. Do not wear sandals. If you use a stepladder: Make sure that it is fully opened. Do not climb a closed stepladder. Make sure that both sides of the stepladder are locked into place. Ask someone to hold it for you, if possible. Clearly mark and make sure that you can see: Any grab bars or handrails. First and last steps. Where the edge of each step is. Use tools that help you move around (mobility aids)  if they are needed. These include: Canes. Walkers. Scooters. Crutches. Turn on the lights when you go into a dark area. Replace any light bulbs as soon as they burn out. Set up your furniture so you have a clear path. Avoid moving your furniture around. If any of your floors are uneven, fix them. If there are any pets around you, be aware of where they are. Review your medicines with your doctor. Some medicines can make you feel dizzy. This can increase your chance of falling. Ask your doctor what other things that you can do to help prevent falls. This information is not intended to replace advice given to you by your health care provider. Make sure you discuss any questions you have with your health care provider. Document Released: 02/02/2009 Document Revised: 09/14/2015 Document Reviewed: 05/13/2014 Elsevier Interactive Patient Education  2017 [employer].

## 2022-05-08 DIAGNOSIS — I69398 Other sequelae of cerebral infarction: Secondary | ICD-10-CM | POA: Diagnosis not present

## 2022-05-08 DIAGNOSIS — M6281 Muscle weakness (generalized): Secondary | ICD-10-CM | POA: Diagnosis not present

## 2022-05-08 DIAGNOSIS — R278 Other lack of coordination: Secondary | ICD-10-CM | POA: Diagnosis not present

## 2022-05-09 DIAGNOSIS — M6281 Muscle weakness (generalized): Secondary | ICD-10-CM | POA: Diagnosis not present

## 2022-05-09 DIAGNOSIS — I69398 Other sequelae of cerebral infarction: Secondary | ICD-10-CM | POA: Diagnosis not present

## 2022-05-09 DIAGNOSIS — R278 Other lack of coordination: Secondary | ICD-10-CM | POA: Diagnosis not present

## 2022-05-10 DIAGNOSIS — R2681 Unsteadiness on feet: Secondary | ICD-10-CM | POA: Diagnosis not present

## 2022-05-10 DIAGNOSIS — M6281 Muscle weakness (generalized): Secondary | ICD-10-CM | POA: Diagnosis not present

## 2022-05-10 DIAGNOSIS — R2689 Other abnormalities of gait and mobility: Secondary | ICD-10-CM | POA: Diagnosis not present

## 2022-05-10 DIAGNOSIS — I69398 Other sequelae of cerebral infarction: Secondary | ICD-10-CM | POA: Diagnosis not present

## 2022-05-13 DIAGNOSIS — R2681 Unsteadiness on feet: Secondary | ICD-10-CM | POA: Diagnosis not present

## 2022-05-13 DIAGNOSIS — R2689 Other abnormalities of gait and mobility: Secondary | ICD-10-CM | POA: Diagnosis not present

## 2022-05-13 DIAGNOSIS — M6281 Muscle weakness (generalized): Secondary | ICD-10-CM | POA: Diagnosis not present

## 2022-05-13 DIAGNOSIS — I69398 Other sequelae of cerebral infarction: Secondary | ICD-10-CM | POA: Diagnosis not present

## 2022-05-14 DIAGNOSIS — I69398 Other sequelae of cerebral infarction: Secondary | ICD-10-CM | POA: Diagnosis not present

## 2022-05-14 DIAGNOSIS — M6281 Muscle weakness (generalized): Secondary | ICD-10-CM | POA: Diagnosis not present

## 2022-05-14 DIAGNOSIS — R278 Other lack of coordination: Secondary | ICD-10-CM | POA: Diagnosis not present

## 2022-05-15 ENCOUNTER — Encounter (INDEPENDENT_AMBULATORY_CARE_PROVIDER_SITE_OTHER): Payer: Medicare PPO | Admitting: Ophthalmology

## 2022-05-15 DIAGNOSIS — H35033 Hypertensive retinopathy, bilateral: Secondary | ICD-10-CM | POA: Diagnosis not present

## 2022-05-15 DIAGNOSIS — H34811 Central retinal vein occlusion, right eye, with macular edema: Secondary | ICD-10-CM

## 2022-05-15 DIAGNOSIS — R2689 Other abnormalities of gait and mobility: Secondary | ICD-10-CM | POA: Diagnosis not present

## 2022-05-15 DIAGNOSIS — R2681 Unsteadiness on feet: Secondary | ICD-10-CM | POA: Diagnosis not present

## 2022-05-15 DIAGNOSIS — I69398 Other sequelae of cerebral infarction: Secondary | ICD-10-CM | POA: Diagnosis not present

## 2022-05-15 DIAGNOSIS — I10 Essential (primary) hypertension: Secondary | ICD-10-CM | POA: Diagnosis not present

## 2022-05-15 DIAGNOSIS — H43813 Vitreous degeneration, bilateral: Secondary | ICD-10-CM

## 2022-05-15 DIAGNOSIS — M6281 Muscle weakness (generalized): Secondary | ICD-10-CM | POA: Diagnosis not present

## 2022-05-16 DIAGNOSIS — R278 Other lack of coordination: Secondary | ICD-10-CM | POA: Diagnosis not present

## 2022-05-16 DIAGNOSIS — I69398 Other sequelae of cerebral infarction: Secondary | ICD-10-CM | POA: Diagnosis not present

## 2022-05-16 DIAGNOSIS — M6281 Muscle weakness (generalized): Secondary | ICD-10-CM | POA: Diagnosis not present

## 2022-05-20 DIAGNOSIS — M6281 Muscle weakness (generalized): Secondary | ICD-10-CM | POA: Diagnosis not present

## 2022-05-20 DIAGNOSIS — R2681 Unsteadiness on feet: Secondary | ICD-10-CM | POA: Diagnosis not present

## 2022-05-20 DIAGNOSIS — R2689 Other abnormalities of gait and mobility: Secondary | ICD-10-CM | POA: Diagnosis not present

## 2022-05-20 DIAGNOSIS — I69398 Other sequelae of cerebral infarction: Secondary | ICD-10-CM | POA: Diagnosis not present

## 2022-05-21 DIAGNOSIS — M6281 Muscle weakness (generalized): Secondary | ICD-10-CM | POA: Diagnosis not present

## 2022-05-21 DIAGNOSIS — R278 Other lack of coordination: Secondary | ICD-10-CM | POA: Diagnosis not present

## 2022-05-21 DIAGNOSIS — I69398 Other sequelae of cerebral infarction: Secondary | ICD-10-CM | POA: Diagnosis not present

## 2022-05-22 ENCOUNTER — Other Ambulatory Visit: Payer: Self-pay

## 2022-05-22 ENCOUNTER — Observation Stay (HOSPITAL_COMMUNITY)
Admission: EM | Admit: 2022-05-22 | Discharge: 2022-05-24 | Disposition: A | Discharge status: Home Health | Payer: Medicare PPO | Attending: Internal Medicine | Admitting: Internal Medicine

## 2022-05-22 DIAGNOSIS — Z951 Presence of aortocoronary bypass graft: Secondary | ICD-10-CM | POA: Diagnosis not present

## 2022-05-22 DIAGNOSIS — K922 Gastrointestinal hemorrhage, unspecified: Principal | ICD-10-CM | POA: Insufficient documentation

## 2022-05-22 DIAGNOSIS — D500 Iron deficiency anemia secondary to blood loss (chronic): Secondary | ICD-10-CM | POA: Insufficient documentation

## 2022-05-22 DIAGNOSIS — R2689 Other abnormalities of gait and mobility: Secondary | ICD-10-CM | POA: Insufficient documentation

## 2022-05-22 DIAGNOSIS — K5731 Diverticulosis of large intestine without perforation or abscess with bleeding: Secondary | ICD-10-CM

## 2022-05-22 DIAGNOSIS — K625 Hemorrhage of anus and rectum: Secondary | ICD-10-CM | POA: Diagnosis present

## 2022-05-22 DIAGNOSIS — Z7902 Long term (current) use of antithrombotics/antiplatelets: Secondary | ICD-10-CM | POA: Insufficient documentation

## 2022-05-22 DIAGNOSIS — R197 Diarrhea, unspecified: Secondary | ICD-10-CM | POA: Diagnosis not present

## 2022-05-22 DIAGNOSIS — Z79899 Other long term (current) drug therapy: Secondary | ICD-10-CM | POA: Diagnosis not present

## 2022-05-22 DIAGNOSIS — R58 Hemorrhage, not elsewhere classified: Secondary | ICD-10-CM | POA: Diagnosis not present

## 2022-05-22 DIAGNOSIS — N179 Acute kidney failure, unspecified: Secondary | ICD-10-CM | POA: Insufficient documentation

## 2022-05-22 DIAGNOSIS — Z8673 Personal history of transient ischemic attack (TIA), and cerebral infarction without residual deficits: Secondary | ICD-10-CM | POA: Diagnosis not present

## 2022-05-22 DIAGNOSIS — I10 Essential (primary) hypertension: Secondary | ICD-10-CM | POA: Diagnosis present

## 2022-05-22 DIAGNOSIS — I2510 Atherosclerotic heart disease of native coronary artery without angina pectoris: Secondary | ICD-10-CM | POA: Diagnosis not present

## 2022-05-22 DIAGNOSIS — M6281 Muscle weakness (generalized): Secondary | ICD-10-CM | POA: Diagnosis not present

## 2022-05-22 DIAGNOSIS — D62 Acute posthemorrhagic anemia: Secondary | ICD-10-CM | POA: Insufficient documentation

## 2022-05-22 DIAGNOSIS — N189 Chronic kidney disease, unspecified: Secondary | ICD-10-CM | POA: Diagnosis not present

## 2022-05-22 DIAGNOSIS — Z7982 Long term (current) use of aspirin: Secondary | ICD-10-CM | POA: Insufficient documentation

## 2022-05-22 DIAGNOSIS — I129 Hypertensive chronic kidney disease with stage 1 through stage 4 chronic kidney disease, or unspecified chronic kidney disease: Secondary | ICD-10-CM | POA: Diagnosis not present

## 2022-05-22 LAB — CBC WITH DIFFERENTIAL/PLATELET
Abs Immature Granulocytes: 0.01 10*3/uL (ref 0.00–0.07)
Basophils Absolute: 0.1 10*3/uL (ref 0.0–0.1)
Basophils Relative: 1 %
Eosinophils Absolute: 0.2 10*3/uL (ref 0.0–0.5)
Eosinophils Relative: 3 %
HCT: 43.6 % (ref 36.0–46.0)
Hemoglobin: 13.8 g/dL (ref 12.0–15.0)
Immature Granulocytes: 0 %
Lymphocytes Relative: 16 %
Lymphs Abs: 1.1 10*3/uL (ref 0.7–4.0)
MCH: 29.5 pg (ref 26.0–34.0)
MCHC: 31.7 g/dL (ref 30.0–36.0)
MCV: 93.2 fL (ref 80.0–100.0)
Monocytes Absolute: 0.7 10*3/uL (ref 0.1–1.0)
Monocytes Relative: 10 %
Neutro Abs: 4.5 10*3/uL (ref 1.7–7.7)
Neutrophils Relative %: 70 %
Platelets: 256 10*3/uL (ref 150–400)
RBC: 4.68 MIL/uL (ref 3.87–5.11)
RDW: 12.8 % (ref 11.5–15.5)
WBC: 6.5 10*3/uL (ref 4.0–10.5)
nRBC: 0 % (ref 0.0–0.2)

## 2022-05-22 LAB — BASIC METABOLIC PANEL
Anion gap: 10 (ref 5–15)
BUN: 26 mg/dL — ABNORMAL HIGH (ref 8–23)
CO2: 27 mmol/L (ref 22–32)
Calcium: 9.2 mg/dL (ref 8.9–10.3)
Chloride: 101 mmol/L (ref 98–111)
Creatinine, Ser: 0.92 mg/dL (ref 0.44–1.00)
GFR, Estimated: >60 mL/min (ref >60)
Glucose, Bld: 112 mg/dL — ABNORMAL HIGH (ref 70–99)
Potassium: 4.1 mmol/L (ref 3.5–5.1)
Sodium: 138 mmol/L (ref 135–145)

## 2022-05-22 LAB — TYPE AND SCREEN
ABO/RH(D): O POS
Antibody Screen: NEGATIVE

## 2022-05-22 LAB — HEPATIC FUNCTION PANEL
ALT: 12 U/L (ref 0–44)
AST: 19 U/L (ref 15–41)
Albumin: 4.1 g/dL (ref 3.5–5.0)
Alkaline Phosphatase: 70 U/L (ref 38–126)
Bilirubin, Direct: 0.1 mg/dL (ref 0.0–0.2)
Indirect Bilirubin: 0.6 mg/dL (ref 0.3–0.9)
Total Bilirubin: 0.7 mg/dL (ref 0.3–1.2)
Total Protein: 7.4 g/dL (ref 6.5–8.1)

## 2022-05-22 LAB — PROTIME-INR
INR: 1.1 (ref 0.8–1.2)
Prothrombin Time: 14 seconds (ref 11.4–15.2)

## 2022-05-22 NOTE — ED Notes (Signed)
Lab draw attempted unsuccessful RN made aware

## 2022-05-22 NOTE — ED Triage Notes (Signed)
Pt arrives EMS from [facility] with rectal bleeding related to hemorrhoids. Per facility pt had large amount of blood in bathroom and in toilet. Per EMS pt has hard time communicating at baseline due to stroke.

## 2022-05-22 NOTE — ED Provider Notes (Signed)
[facility] Provider Note   CSN: [id number] Arrival date & time: 05/22/22  1859     History  Chief Complaint  Patient presents with   Rectal Bleeding    Kristin [name] is a 82 y.o. female.   Rectal Bleeding Patient presents with rectal bleeding.  Has had a large amount of rectal bleeding around 3 times today.  Blood in the stool.  Difficult speech due to previous aphasia from stroke.  Is on Plavix and aspirin.  Does have a history of diverticulosis but has not had bleeding.  Also known hemorrhoids.  Does not feel lightheaded.  Patient's daughter in law is a Designer, jewellery and son is Database administrator.    Past Medical History:  Diagnosis Date   Arthritis    hands   CAD (coronary artery disease)    a. 2010: stents pRCA and dRCA. b. LHC 12/13/13: Minimal distal LM dz. LAD 95% ostial w/ L-->R collat. LCx mild dz. RCA 95% mid-dRCA. Patent stents proximal and distal RCA. EF not assessed, Nl by nuclear study.      Central retinal vein occlusion, right eye    Chronic kidney disease    hx kidney stone   Depression    Diplopia    Ear pain    right   Eosinophilic esophagitis    Essential hypertension    GERD (gastroesophageal reflux disease)    H/O: whooping cough    Hyperlipidemia with target LDL less than 70    Incontinence    urine   Stroke ([place]) 2001   reesultant Rt spastic hemplegia and aphasia     Home Medications Prior to Admission medications   Medication Sig Start Date End Date Taking? Authorizing Provider  aspirin 81 MG tablet Take 81 mg by mouth daily.    [provider]  atorvastatin (LIPITOR) 40 MG tablet tAKE ONE TABLET BY MOUTH DAILY. 03/13/22   Lauree Chandler, NP  Cholecalciferol (VITAMIN D3) 50 MCG (2000 UT) capsule Take 2,000 Units by mouth daily.    [provider]  clopidogrel (PLAVIX) 75 MG tablet TAKE ONE TABLET BY MOUTH DAILY 09/10/21   Lauree Chandler, NP  Coenzyme Q10 (COQ-10  PO) Take 1 tablet daily by mouth.    [provider]  desmopressin (DDAVP) 0.2 MG tablet Take 0.4 mg by mouth at bedtime.    [provider]  diclofenac Sodium (VOLTAREN) 1 % GEL APPLY TO THE AFFECTED AREA(S) THREE TIMES DAILY. 02/13/21   Raulkar, Clide Deutscher, MD  docusate sodium (COLACE) 100 MG capsule Take 100 mg by mouth daily.    [provider]  DULoxetine (CYMBALTA) 20 MG capsule TAKE ONE CAPSULE BY MOUTH DAILY 12/13/21   Lauree Chandler, NP  estradiol (ESTRACE) 0.1 MG/GM vaginal cream  03/02/20   [provider]  gabapentin (NEURONTIN) 300 MG capsule TAKE ONE CAPSULE BY MOUTH THREE TIMES DAILY 12/10/21   Raulkar, Clide Deutscher, MD  hydrocortisone (ANUSOL-HC) 2.5 % rectal cream Place 1 Application rectally 2 (two) times daily. 12/14/21   Gatha Mayer, MD  methocarbamol (ROBAXIN) 500 MG tablet Take 1 tablet (500 mg total) by mouth every 8 (eight) hours as needed for muscle spasms. 03/30/21   Ngetich, Dinah C, NP  mirabegron ER (MYRBETRIQ) 50 MG TB24 tablet Take 1 tablet by mouth daily. 08/25/20   [provider]  nitroGLYCERIN (NITROSTAT) 0.4 MG SL tablet Place 1 tablet (0.4 mg total) under the tongue every 5 (five) minutes  as needed for chest pain. 10/30/21   Lauree Chandler, NP  omeprazole (PRILOSEC) 20 MG capsule TAKE ONE CAPSULE BY MOUTH DAILY 12/14/21   Lauree Chandler, NP  trimethoprim (TRIMPEX) 100 MG tablet Take 100 mg by mouth daily. 04/29/22   [provider]  TURMERIC PO Take 1 tablet by mouth daily.     [provider]      Allergies    Patient has no known allergies.    Review of Systems   Review of Systems  Gastrointestinal:  Positive for hematochezia.    Physical Exam Updated Vital Signs BP 127/66   Pulse 72   Temp 98.5 F (36.9 C) (Oral)   Resp 18   SpO2 95%  Physical Exam Vitals and nursing note reviewed.  Cardiovascular:     Rate and Rhythm: Regular rhythm.  Pulmonary:     Breath sounds: No  wheezing.  Abdominal:     Tenderness: There is no abdominal tenderness.  Genitourinary:    Comments: Rectal exam did show some external hemorrhoids.  Some blood visible but not necessarily the clear source of the bleeding. Musculoskeletal:        General: No tenderness.  Skin:    Capillary Refill: Capillary refill takes less than 2 seconds.     Coloration: Skin is not pale.  Neurological:     Mental Status: She is alert and oriented to person, place, and time.     ED Results / Procedures / Treatments   Labs (all labs ordered are listed, but only abnormal results are displayed) Labs Reviewed  BASIC METABOLIC PANEL - Abnormal; Notable for the following components:      Result Value   Glucose, Bld 112 (*)    BUN 26 (*)    All other components within normal limits  CBC WITH DIFFERENTIAL/PLATELET  HEPATIC FUNCTION PANEL  PROTIME-INR  CBC  TYPE AND SCREEN    EKG None  Radiology No results found.  Procedures Procedures    Medications Ordered in ED Medications - No data to display  ED Course/ Medical Decision Making/ A&P                             Medical Decision Making Amount and/or Complexity of Data Reviewed Labs: ordered.   Patient with rectal bleeding.  Hemorrhoidal versus lower GI.  Initial hemoglobin reassuring.  Differential diagnosis does include lower GI sources of the bleeding.  Also potentially hemorrhoids.  Initial hemoglobin reassuring.  Vitals reassuring.  However is on aspirin and Plavix. Hemoglobin reassuring.  Discussed with patient and daughter-in-law.  Has not had relief further bleeding since has been here.  Will check close to a 4-hour recheck and if hemoglobin stable hopefully can be followed up as an outpatient.  If drops may need further treatment.  Care turned over to Dr. Dina Rich.        Final Clinical Impression(s) / ED Diagnoses Final diagnoses:  Lower GI bleed    Rx / DC Orders ED Discharge Orders     None          Davonna Belling, MD 05/22/22 2354

## 2022-05-22 NOTE — ED Provider Triage Note (Signed)
Emergency Medicine Provider Triage Evaluation Note  Linsey Arteaga , a 82 y.o. female  was evaluated in triage.  Pt complains of rectal bleeding related to hemorrhoids.  Patient's step-daughter is in the room with patient.  She has severe aphasia from prior CVA and has difficulty communicating.  Patient came from [facility] with bright red bleeding while going to the bathroom and was found to have it running down her legs earlier.  Denies abdominal pain, constipation, diarrhea.. She does take ASA and Plavix daily.    Review of Systems  Positive: As above Negative: As above  Physical Exam  BP 115/81 (BP Location: Right Arm)   Pulse 88   Temp 97.8 F (36.6 C) (Oral)   Resp 17   SpO2 100%  Gen:   Awake, no distress   Resp:  Normal effort  MSK:   Moves extremities without difficulty  Other:    Medical Decision Making  Medically screening exam initiated at 7:57 PM.  Appropriate orders placed.  Evamae Rowen was informed that the remainder of the evaluation will be completed by another provider, this initial triage assessment does not replace that evaluation, and the importance of remaining in the ED until their evaluation is complete.     Pat Kocher, [place] 05/22/22 1958

## 2022-05-23 ENCOUNTER — Other Ambulatory Visit: Payer: Self-pay | Admitting: Internal Medicine

## 2022-05-23 ENCOUNTER — Encounter (HOSPITAL_COMMUNITY): Payer: Self-pay | Admitting: Internal Medicine

## 2022-05-23 DIAGNOSIS — K922 Gastrointestinal hemorrhage, unspecified: Secondary | ICD-10-CM | POA: Diagnosis present

## 2022-05-23 DIAGNOSIS — Z7902 Long term (current) use of antithrombotics/antiplatelets: Secondary | ICD-10-CM | POA: Diagnosis not present

## 2022-05-23 DIAGNOSIS — K625 Hemorrhage of anus and rectum: Secondary | ICD-10-CM | POA: Diagnosis not present

## 2022-05-23 DIAGNOSIS — D62 Acute posthemorrhagic anemia: Secondary | ICD-10-CM | POA: Insufficient documentation

## 2022-05-23 DIAGNOSIS — K5731 Diverticulosis of large intestine without perforation or abscess with bleeding: Secondary | ICD-10-CM

## 2022-05-23 LAB — CBC
HCT: 28.7 % — ABNORMAL LOW (ref 36.0–46.0)
Hemoglobin: 8.8 g/dL — ABNORMAL LOW (ref 12.0–15.0)
MCH: 29.2 pg (ref 26.0–34.0)
MCHC: 30.7 g/dL (ref 30.0–36.0)
MCV: 95.3 fL (ref 80.0–100.0)
Platelets: 167 10*3/uL (ref 150–400)
RBC: 3.01 MIL/uL — ABNORMAL LOW (ref 3.87–5.11)
RDW: 12.7 % (ref 11.5–15.5)
WBC: 5 10*3/uL (ref 4.0–10.5)
nRBC: 0 % (ref 0.0–0.2)

## 2022-05-23 LAB — HEMOGLOBIN AND HEMATOCRIT, BLOOD
HCT: 36.9 % (ref 36.0–46.0)
HCT: 37.7 % (ref 36.0–46.0)
Hemoglobin: 11.6 g/dL — ABNORMAL LOW (ref 12.0–15.0)
Hemoglobin: 11.9 g/dL — ABNORMAL LOW (ref 12.0–15.0)

## 2022-05-23 LAB — BASIC METABOLIC PANEL
Anion gap: 7 (ref 5–15)
BUN: 26 mg/dL — ABNORMAL HIGH (ref 8–23)
CO2: 25 mmol/L (ref 22–32)
Calcium: 8.4 mg/dL — ABNORMAL LOW (ref 8.9–10.3)
Chloride: 106 mmol/L (ref 98–111)
Creatinine, Ser: 0.6 mg/dL (ref 0.44–1.00)
GFR, Estimated: >60 mL/min (ref >60)
Glucose, Bld: 118 mg/dL — ABNORMAL HIGH (ref 70–99)
Potassium: 3.6 mmol/L (ref 3.5–5.1)
Sodium: 138 mmol/L (ref 135–145)

## 2022-05-23 MED ORDER — ACETAMINOPHEN 325 MG PO TABS
650.0000 mg | ORAL_TABLET | Freq: Four times a day (QID) | ORAL | Status: DC | PRN
  Administered 2022-05-23: 650 mg via ORAL
  Filled 2022-05-23: qty 2

## 2022-05-23 MED ORDER — HYDROCORTISONE (PERIANAL) 2.5 % EX CREA: 1.0000 | TOPICAL_CREAM | CUTANEOUS | Status: DC | PRN

## 2022-05-23 MED ORDER — TRIMETHOPRIM 100 MG PO TABS
100.0000 mg | ORAL_TABLET | Freq: Every day | ORAL | Status: DC
  Administered 2022-05-23: 100 mg via ORAL
  Filled 2022-05-23 (×2): qty 1

## 2022-05-23 MED ORDER — ATORVASTATIN CALCIUM 40 MG PO TABS
40.0000 mg | ORAL_TABLET | Freq: Every day | ORAL | Status: DC
  Administered 2022-05-23: 40 mg via ORAL
  Filled 2022-05-23: qty 1

## 2022-05-23 MED ORDER — DULOXETINE HCL 20 MG PO CPEP
20.0000 mg | ORAL_CAPSULE | Freq: Every day | ORAL | Status: DC
  Administered 2022-05-23 – 2022-05-24 (×2): 20 mg via ORAL
  Filled 2022-05-23 (×2): qty 1

## 2022-05-23 MED ORDER — ACETAMINOPHEN 650 MG RE SUPP: 650.0000 mg | Freq: Four times a day (QID) | RECTAL | Status: DC | PRN

## 2022-05-23 MED ORDER — VITAMIN D 25 MCG (1000 UNIT) PO TABS
2000.0000 [IU] | ORAL_TABLET | Freq: Every day | ORAL | Status: DC
  Administered 2022-05-23: 2000 [IU] via ORAL
  Filled 2022-05-23: qty 2

## 2022-05-23 MED ORDER — SODIUM CHLORIDE 0.9 % IV SOLN: INTRAVENOUS | Status: AC

## 2022-05-23 MED ORDER — GABAPENTIN 300 MG PO CAPS
300.0000 mg | ORAL_CAPSULE | Freq: Three times a day (TID) | ORAL | Status: DC
  Administered 2022-05-23 – 2022-05-24 (×2): 300 mg via ORAL
  Filled 2022-05-23 (×2): qty 1

## 2022-05-23 NOTE — Consult Note (Addendum)
Consultation  Referring Provider:   Dr. Erlinda Hong   Primary Care Physician:  Lauree Chandler, NP Primary Gastroenterologist:  Dr. Havery Moros       Reason for Consultation:  Lower GI Bleed            HPI:   Kristin [name] is a 82 y.o. female with a past medical history of left-sided MCA CVA 2001 on Plavix and Aspirin and CAD (07/07/2019 echo with an LVEF 60-65%), status post PTCA in 2011 with stent placement, status post CABG in 2015, depression, stroke with residual right-sided hemiplegia and expressive aphasia, GERD, hemorrhoids, diverticulosis and multiple others, who presented to the ED on 05/22/2022 with rectal bleeding.    At time of admission patient had a limited history given residual expressive aphasia from prior stroke, reported 3 episodes of rectal bleeding at home.    Today, the history is garnered from patient's stepdaughter in the room given patient's expressive aphasia.  She explains that she had 3 episodes that they are aware of of a large amount of rectal bleeding at home last night, she is unaware of any more since time of admission.  Tells me the patient likely cannot tell if she has had another stool given that she has something helping her with urine output at the moment and has not been up from the bed.  Per patient it seems like she also was having some lower abdominal pain.  Stepdaughter explains that she has some trouble with constipation and they tried to push fluids and liquids, but she is also unsure of when her actual last bowel movement was.    Denies recent fever, chills, weight loss or change in bowel habits.  ED course: Hemodynamically stable, hemoglobin 13.8, BUN 26, creatinine 0.9, ED physician appreciated external hemorrhoids on rectal exam with some visible blood, no further episodes of hematochezia in the ED, however hemoglobin dropped to 8.8 on repeat labs done in 4 hours  GI history: 10/05/2015 colonoscopy with nonthrombosed external hemorrhoids, diverticulosis in  the entire examined colon, one 3 mm polyp in the cecum, one 6 mm polyp in the rectum, examined portion ileum was normal, altered vascular and granular mucosa in the rectum biopsied, nonbleeding inflamed internal hemorrhoids; biopsy showed rectal prolapse 10/05/2015 EGD suspected monilial esophagitis, erythematous mucosa in the gastric fundus, otherwise normal to the second portion of the duodenum; pathology showed reactive gastropathy and esophageal candidiasis- given 2 wks Fluconazole  Past Medical History:  Diagnosis Date   Arthritis    hands   CAD (coronary artery disease)    a. 2010: stents pRCA and dRCA. b. LHC 12/13/13: Minimal distal LM dz. LAD 95% ostial w/ L-->R collat. LCx mild dz. RCA 95% mid-dRCA. Patent stents proximal and distal RCA. EF not assessed, Nl by nuclear study.      Central retinal vein occlusion, right eye    Chronic kidney disease    hx kidney stone   Depression    Diplopia    Ear pain    right   Eosinophilic esophagitis    Essential hypertension    GERD (gastroesophageal reflux disease)    H/O: whooping cough    Hyperlipidemia with target LDL less than 70    Incontinence    urine   Stroke ([place]) 2001   reesultant Rt spastic hemplegia and aphasia     Past Surgical History:  Procedure Laterality Date   ABDOMINAL HYSTERECTOMY     CAROTID STENT     cataracts  COLONOSCOPY N/A 10/05/2015   Procedure: COLONOSCOPY;  Surgeon: Manus Gunning, MD;  Location: [facility];  Service: Gastroenterology;  Laterality: N/A;   CORONARY ARTERY BYPASS GRAFT N/A 12/17/2013   Procedure: CORONARY ARTERY BYPASS GRAFTING (CABG);  Surgeon: Gaye Pollack, MD;  Location: [facility];  Service: Open Heart Surgery;  Laterality: N/A;  Times 2 using left internal mammary artery and endoscopically harvested right saphenous vein   ESOPHAGOGASTRODUODENOSCOPY     ESOPHAGOGASTRODUODENOSCOPY N/A 10/05/2015   Procedure: ESOPHAGOGASTRODUODENOSCOPY (EGD);  Surgeon: Manus Gunning, MD;   Location: [facility];  Service: Gastroenterology;  Laterality: N/A;   INTRAOPERATIVE TRANSESOPHAGEAL ECHOCARDIOGRAM N/A 12/17/2013   Procedure: INTRAOPERATIVE TRANSESOPHAGEAL ECHOCARDIOGRAM;  Surgeon: Gaye Pollack, MD;  Location: [facility];  Service: Open Heart Surgery;  Laterality: N/A;   lasix     LEFT HEART CATHETERIZATION WITH CORONARY ANGIOGRAM N/A 12/13/2013   Procedure: LEFT HEART CATHETERIZATION WITH CORONARY ANGIOGRAM;  Surgeon: Jettie Booze, MD;  Location: [facility] CATH LAB;  Service: Cardiovascular;  Laterality: N/A;   retina injection      Family History  Problem Relation Age of Onset   Diabetes Mother    Heart disease Mother    Heart attack Father    Breast cancer Sister     Social History   Tobacco Use   Smoking status: Never   Smokeless tobacco: Never  Vaping Use   Vaping Use: Never used  Substance Use Topics   Alcohol use: Yes    Comment: rarely   Drug use: No    Prior to Admission medications   Medication Sig Start Date End Date Taking? Authorizing Provider  acetaminophen (TYLENOL) 500 MG tablet Take 1,000 mg by mouth in the morning, at noon, and at bedtime.   Yes [provider]  aspirin 81 MG tablet Take 81 mg by mouth daily.   Yes [provider]  atorvastatin (LIPITOR) 40 MG tablet tAKE ONE TABLET BY MOUTH DAILY. Patient taking differently: Take 40 mg by mouth at bedtime. 03/13/22  Yes Lauree Chandler, NP  Cholecalciferol (VITAMIN D3) 50 MCG (2000 UT) capsule Take 2,000 Units by mouth at bedtime.   Yes [provider]  clopidogrel (PLAVIX) 75 MG tablet TAKE ONE TABLET BY MOUTH DAILY Patient taking differently: Take 75 mg by mouth daily. 09/10/21  Yes Lauree Chandler, NP  Coenzyme Q10 (COQ-10 PO) Take 1 tablet daily by mouth.   Yes [provider]  desmopressin (DDAVP) 0.2 MG tablet Take 0.4 mg by mouth at bedtime.   Yes [provider]  docusate sodium (COLACE) 100 MG capsule Take 100 mg by mouth daily.    Yes [provider]  DULoxetine (CYMBALTA) 20 MG capsule TAKE ONE CAPSULE BY MOUTH DAILY 12/13/21  Yes Lauree Chandler, NP  estradiol (ESTRACE) 0.1 MG/GM vaginal cream Place 1 Applicatorful vaginally 3 (three) times a week. 03/02/20  Yes [provider]  gabapentin (NEURONTIN) 300 MG capsule TAKE ONE CAPSULE BY MOUTH THREE TIMES DAILY Patient taking differently: Take 300 mg by mouth 3 (three) times daily. 12/10/21  Yes Raulkar, Clide Deutscher, MD  hydrocortisone (ANUSOL-HC) 2.5 % rectal cream Place 1 Application rectally 2 (two) times daily. Patient taking differently: Place 1 Application rectally as needed for hemorrhoids. 12/14/21  Yes Gatha Mayer, MD  methocarbamol (ROBAXIN) 500 MG tablet Take 1 tablet (500 mg total) by mouth every 8 (eight) hours as needed for muscle spasms. Patient taking differently: Take 500 mg by mouth as needed for muscle spasms.  03/30/21  Yes Ngetich, Dinah C, NP  mirabegron ER (MYRBETRIQ) 50 MG TB24 tablet Take 50 mg by mouth daily. 08/25/20  Yes [provider]  nitroGLYCERIN (NITROSTAT) 0.4 MG SL tablet Place 1 tablet (0.4 mg total) under the tongue every 5 (five) minutes as needed for chest pain. 10/30/21  Yes Lauree Chandler, NP  omeprazole (PRILOSEC) 20 MG capsule TAKE ONE CAPSULE BY MOUTH DAILY 12/14/21  Yes Lauree Chandler, NP  trimethoprim (TRIMPEX) 100 MG tablet Take 100 mg by mouth at bedtime. 04/29/22  Yes [provider]  TURMERIC PO Take 1 tablet by mouth daily.    Yes [provider]  diclofenac Sodium (VOLTAREN) 1 % GEL APPLY TO THE AFFECTED AREA(S) THREE TIMES DAILY. Patient not taking: Reported on 05/23/2022 02/13/21   Izora Ribas, MD    Current Facility-Administered Medications  Medication Dose Route Frequency Provider Last Rate Last Admin   0.9 %  sodium chloride infusion   Intravenous Continuous Shela Leff, MD 125 mL/hr at 05/23/22 0307 New Bag at 05/23/22 0307   acetaminophen (TYLENOL) tablet  650 mg  650 mg Oral Q6H PRN Shela Leff, MD       Or   acetaminophen (TYLENOL) suppository 650 mg  650 mg Rectal Q6H PRN Shela Leff, MD       Current Outpatient Medications  Medication Sig Dispense Refill   acetaminophen (TYLENOL) 500 MG tablet Take 1,000 mg by mouth in the morning, at noon, and at bedtime.     aspirin 81 MG tablet Take 81 mg by mouth daily.     atorvastatin (LIPITOR) 40 MG tablet tAKE ONE TABLET BY MOUTH DAILY. (Patient taking differently: Take 40 mg by mouth at bedtime.) 90 tablet 3   Cholecalciferol (VITAMIN D3) 50 MCG (2000 UT) capsule Take 2,000 Units by mouth at bedtime.     clopidogrel (PLAVIX) 75 MG tablet TAKE ONE TABLET BY MOUTH DAILY (Patient taking differently: Take 75 mg by mouth daily.) 90 tablet 3   Coenzyme Q10 (COQ-10 PO) Take 1 tablet daily by mouth.     desmopressin (DDAVP) 0.2 MG tablet Take 0.4 mg by mouth at bedtime.     docusate sodium (COLACE) 100 MG capsule Take 100 mg by mouth daily.     DULoxetine (CYMBALTA) 20 MG capsule TAKE ONE CAPSULE BY MOUTH DAILY 90 capsule 3   estradiol (ESTRACE) 0.1 MG/GM vaginal cream Place 1 Applicatorful vaginally 3 (three) times a week.     gabapentin (NEURONTIN) 300 MG capsule TAKE ONE CAPSULE BY MOUTH THREE TIMES DAILY (Patient taking differently: Take 300 mg by mouth 3 (three) times daily.) 270 capsule 1   hydrocortisone (ANUSOL-HC) 2.5 % rectal cream Place 1 Application rectally 2 (two) times daily. (Patient taking differently: Place 1 Application rectally as needed for hemorrhoids.) 30 g 1   methocarbamol (ROBAXIN) 500 MG tablet Take 1 tablet (500 mg total) by mouth every 8 (eight) hours as needed for muscle spasms. (Patient taking differently: Take 500 mg by mouth as needed for muscle spasms.) 30 tablet 3   mirabegron ER (MYRBETRIQ) 50 MG TB24 tablet Take 50 mg by mouth daily.     nitroGLYCERIN (NITROSTAT) 0.4 MG SL tablet Place 1 tablet (0.4 mg total) under the tongue every 5 (five) minutes as needed  for chest pain. 30 tablet 0   omeprazole (PRILOSEC) 20 MG capsule TAKE ONE CAPSULE BY MOUTH DAILY 90 capsule 3   trimethoprim (TRIMPEX) 100 MG tablet Take 100 mg by mouth at bedtime.  TURMERIC PO Take 1 tablet by mouth daily.      diclofenac Sodium (VOLTAREN) 1 % GEL APPLY TO THE AFFECTED AREA(S) THREE TIMES DAILY. (Patient not taking: Reported on 05/23/2022) 100 g 0    Allergies as of 05/22/2022   (No Known Allergies)   Review of Systems:    Constitutional: No weight loss, fever or chills Skin: No rash Cardiovascular: No chest pain Respiratory: No SOB  Gastrointestinal: See HPI and otherwise negative Genitourinary: No dysuria  Neurological: No headache, dizziness or syncope Musculoskeletal: No new muscle or joint pain Hematologic: No bruising Psychiatric: No history of depression or anxiety    Physical Exam:  Vital signs in last 24 hours: Temp:  [97.8 F (36.6 C)-98.5 F (36.9 C)] 98.3 F (36.8 C) (02/01 0447) Pulse Rate:  [66-88] 66 (02/01 0600) Resp:  [16-18] 16 (02/01 0600) BP: (115-144)/(65-82) 144/82 (02/01 0600) SpO2:  [95 %-100 %] 97 % (02/01 0600)   General:   Pleasant Elderly Caucasian female appears to be in NAD, Well developed, Well nourished, alert and cooperative +expressive aphasia Head:  Normocephalic and atraumatic. Eyes:   PEERL, EOMI. No icterus. Conjunctiva pink. Ears:  Normal auditory acuity. Neck:  Supple Throat: Oral cavity and pharynx without inflammation, swelling or lesion.  Lungs: Respirations even and unlabored. Lungs clear to auscultation bilaterally.   No wheezes, crackles, or rhonchi.  Heart: Normal S1, S2. No MRG. Regular rate and rhythm.+ 1-2+ bilateral lower extremity pitting edema Abdomen:  Tense, mild distension with mild generalized ttp, some worse in b/l lower quadrants, No rebound or guarding. Normal bowel sounds. No appreciable masses or hepatomegaly. Rectal:  Limited due to body position, External:no visible prolapse or hemorrhoids,  some blood residue; internal: no mass, no tenderness, blood tinged brown residue on glove Msk:  Symmetrical without gross deformities. Peripheral pulses intact.  Extremities:  No deformity or joint abnormality.  Neurologic:  Alert, + Right-sided hemiplegia Skin:   Dry and intact without significant lesions or rashes. Psychiatric: Demonstrates good judgement and reason without abnormal affect or behaviors.+ Expressive aphasia  LAB RESULTS: Recent Labs    05/22/22 2030 05/23/22 0020 05/23/22 0305 05/23/22 0639  WBC 6.5 5.0  --   --   HGB 13.8 8.8* 11.6* 11.9*  HCT 43.6 28.7* 36.9 37.7  PLT 256 167  --   --    BMET Recent Labs    05/22/22 2030 05/23/22 0305  NA 138 138  K 4.1 3.6  CL 101 106  CO2 27 25  GLUCOSE 112* 118*  BUN 26* 26*  CREATININE 0.92 0.60  CALCIUM 9.2 8.4*   LFT Recent Labs    05/22/22 2039  PROT 7.4  ALBUMIN 4.1  AST 19  ALT 12  ALKPHOS 70  BILITOT 0.7  BILIDIR 0.1  IBILI 0.6   PT/INR Recent Labs    05/22/22 2135  LABPROT 14.0  INR 1.1     Impression / Plan:   Impression: 1.  Lower GI bleed: With initial drop in hemoglobin from 13.8--> 8.8 (likely erroneous)--> 11.6--> 11.9, BUN stable overnight at 26, patient does have some lower abdominal discomfort, no further bleeding overnight, last colonoscopy in 2017 with some rectal prolapse tissue and diverticulosis, last dose of Plavix was yesterday morning the patient thinks 05/22/2022; consider most likely diverticular bleed versus rectal prolapse tissue bleeding versus other 2.  Acute blood loss anemia: With above 3.  AKI: Creatinine improved from 0.9 to--> 0.60, BUN stable at 26 4.  CAD status post PCI and  CABG: On Aspirin and Plavix-currently being held, last dose 05/22/2018 4 AM 5.  History of stroke with residual right-sided hemiplegia and expressive aphasia 6.  Depression 7.  GERD  Plan: 1.  At this point will allow patient to have clear liquids. 2.  Continue to monitor hemoglobin with  transfusion as needed less than 7 3.  If patient has large amount of bleeding would recommend CTA 4.  Agree with holding Plavix and Aspirin for now  Thank you for your kind consultation, we will continue to follow.  Lavone Nian [facility]  05/23/2022, 8:23 AM  GI ATTENDING  History, laboratories, x-rays, prior colonoscopy report all personally reviewed.  Patient personally seen and examined.  Daughter in room at bedside.  Also spoke with step daughter-in-law [name].  Agree with comprehensive consultation note as outlined above.  The patient presents with low-volume recurrent rectal bleeding.  No abdominal pain.  Hemodynamically stable.  Mild drift in hemoglobin.  Has not been having problems with constipation or other issues.  She is hungry.  Suspect mild to moderate diverticular bleeding.  Could be internal hemorrhoids.  Rectal exam as above.  Continue to monitor closely.  For significant recurrent bleeding, CTA.  Discussed with the patient and her daughter.  Will follow closely.  Docia Chuck. Geri Seminole., M.D. [facility] Division of Gastroenterology

## 2022-05-23 NOTE — ED Provider Notes (Signed)
Patient signed out pending repeat hemoglobin.  In brief presented with rectal bleeding.  Initially thought to be related to hemorrhoid but minimal hemorrhoid noted on exam.  Initial hemoglobin reassuring.  Patient signed out pending repeat hemoglobin.  If stable, patient could reliably go home and follow-up as her son is a gastroenterologist.  Repeat hemoglobin is significantly lower at 8.8.  I advised the patient's daughter-in-law and the patient at the bedside.  Will plan for admission.  She has already been typed and screened.  She is hemodynamically stable.  Will plan for admission to the hospitalist.  Gastroenterology, Dr. Carlean Purl advised of patient's admission.  Physical Exam  BP 127/66   Pulse 72   Temp 98.5 F (36.9 C) (Oral)   Resp 18   SpO2 95%   Physical Exam Awake, alert, no acute distress Procedures  Procedures  ED Course / MDM    Medical Decision Making Amount and/or Complexity of Data Reviewed Labs: ordered.  Risk Decision regarding hospitalization.   Problem List Items Addressed This Visit   None Visit Diagnoses     Lower GI bleed    -  Primary             Merryl Hacker, MD 05/23/22 [phone]

## 2022-05-23 NOTE — Progress Notes (Addendum)
PROGRESS NOTE    Kristin [name]  HWE:[id number] DOB: 1940/07/08 DOA: 05/22/2022 PCP: Lauree Chandler, NP     Brief Narrative:    h/o CVA in 2001 with residual right hemiplegia and expressive aphasia, CAD s/p PCI/Stenting to RCA, s/p CABG in 2015, nephrolithiasis, required right ureteral stent in 2018, history of pseudomonal UTI, history of diverticulosis and hemorrhoids, presented to the ED with rectal bleeding/bright red blood per rectum. She is on aspirin and Plavix   Subjective:  Hemodynamically stable, hgb 11.9 Family at bedside   Assessment & Plan:  Principal Problem:   Acute lower GI bleeding Active Problems:   CAD (coronary artery disease)   Essential hypertension   AKI (acute kidney injury) ([place])   History of stroke   Acute blood loss anemia    Assessment and Plan:   Acute lower GI bleed, rectal bleeding/bright red blood per rectum Acute blood loss anemia -Has some left lower abdominal discomfort on exam, last colonoscopy in 2017 with  diverticulosis internal and external hemorrhoids -Hemodynamically stable -hemoglobin from 13.8--> 8.8 (likely erroneous)--> 11.6--> 11.9  -Seen by GI, okay to start clear liquid diet, transfuse if hemoglobin less than 7, CTA if large amount of bleeding, hold aspirin and Plavix -received gentle hydration initially -Will follow GI recommendation   CAD status post PCI to RCV on 2010 and CABG in 2015 -No chest pain -not on beta-blocker due to history of bradycardia on beta-blocker -Hold aspirin and Plavix given acute GI bleed -Continue Lipitor    History of stroke with residual right-sided hemiplegia and expressive aphasia -Hold aspirin and Plavix given acute GI bleed  -Continue statin  OAB with mixed urinary incontinence and nocturnal polyuria. - Taking Myrbetriq 50 mg daily and DDAVP 0.2 mg nightly per last urology note -monitor  Sodium   H/o nephrolithiasis ,recurrent UTI.  - Taking Trimethoprim '100mg'$  daily and uses  topical vaginal estrogen cream 2-3x/week for UTI prophylaxis.   I have Reviewed nursing notes, Vitals, pain scores, I/o's, Lab results and  imaging results since pt's last encounter, details please see discussion above  I ordered the following labs:  Unresulted Labs (From admission, onward)     Start     Ordered   05/24/22 0500  CBC with Differential/Platelet  Tomorrow morning,   R        05/23/22 1442   05/24/22 8938  Basic metabolic panel  Tomorrow morning,   R        05/23/22 1442   05/24/22 0500  Magnesium  Tomorrow morning,   R        05/23/22 1442   05/24/22 0500  Phosphorus  Tomorrow morning,   R        05/23/22 1442             DVT prophylaxis: SCDs Start: 05/23/22 0238   Code Status:   Code Status: DNR  Family Communication: Family at bedside Disposition:   Status is: Observation  Dispo: The patient is from:  independent living              Anticipated d/c is to: Independent living              Anticipated d/c date is: Possible tomorrow if cleared by GI, need to discuss aspirin and Plavix resumption with GI  Antimicrobials:    Anti-infectives (From admission, onward)    Start     Dose/Rate Route Frequency Ordered Stop   05/23/22 2200  trimethoprim (TRIMPEX) tablet 100 mg  100 mg Oral Daily at bedtime 05/23/22 1812            Objective: Vitals:   05/23/22 0600 05/23/22 0912 05/23/22 1220 05/23/22 1625  BP: (!) 144/82 132/77 (!) 151/76 (!) 159/90  Pulse: 66 73 71 78  Resp: '16 16  20  '$ Temp:  (!) 97.4 F (36.3 C) 97.6 F (36.4 C) 97.7 F (36.5 C)  TempSrc:  Oral Oral Oral  SpO2: 97% 97% 95% 99%   No intake or output data in the 24 hours ending 05/23/22 1819 There were no vitals filed for this visit.  Examination:  General exam: alert, awake, pleasant, expressive aphasia Respiratory system: Clear to auscultation. Respiratory effort normal. Cardiovascular system:  RRR.  Gastrointestinal system: Abdomen is nondistended, soft , possible  left lower quadrant discomfort with pressure.  Normal bowel sounds heard. Central nervous system: Alert and oriented. right hemiplegia and expressive aphasia Extremities:  chronic pedal edema, right > left  Skin: No rashes, lesions or ulcers Psychiatry: Judgement and insight appear normal. Mood & affect appropriate.     Data Reviewed: I have personally reviewed  labs and visualized  imaging studies since the last encounter and formulate the plan        Scheduled Meds:  atorvastatin  40 mg Oral QHS   cholecalciferol  2,000 Units Oral QHS   DULoxetine  20 mg Oral Daily   gabapentin  300 mg Oral TID   trimethoprim  100 mg Oral QHS   Continuous Infusions:     LOS: 0 days   Time spent: 72mns  FFlorencia Reasons MD PhD FACP [facility]  Available via Epic secure chat 7am-7pm for nonurgent issues Please page for urgent issues To page the attending provider between 7A-7P or the covering provider during after hours 7P-7A, please log into the web site [email or web address] and access using universal [place] password for that web site. If you do not have the password, please call the hospital operator.    05/23/2022, 6:19 PM

## 2022-05-23 NOTE — H&P (Signed)
History and Physical    Suri Tafolla QTM:[id number] DOB: 01-23-1941 DOA: 05/22/2022  PCP: Lauree Chandler, NP  Patient coming from: Home  Chief Complaint: Rectal bleeding  HPI: Kristin [name] is a 82 y.o. female with medical history significant of CAD status post PCI and CABG, depression, hypertension, GERD, hyperlipidemia, stroke with residual right-sided hemiplegia and expressive aphasia, hemorrhoids, diverticulosis, and other medical comorbidities presented to the ED with rectal bleeding/bright red blood per rectum.  She is on aspirin and Plavix.  In the ED, patient remained hemodynamically stable.  Initial labs showing hemoglobin 13.8, BUN 26, creatinine 0.9 (baseline 0.4-0.6). ED physician did appreciate external hemorrhoids on rectal exam with some visible blood.  Patient did not have any further episodes of hematochezia in the ED.  However, hemoglobin dropped to 8.8 on repeat labs done in 4 hours. GI consulted by ED physician (Dr. Carlean Purl).  [facility] called to admit.  History limited as patient has residual expressive aphasia from prior stroke.  She reports 3 episodes of rectal bleeding at home.  Denies hematemesis or abdominal pain.  She is on aspirin and Plavix.  Denies dizziness, chest pain, or shortness of breath.  She has no other complaints.  Review of Systems:  Review of Systems  All other systems reviewed and are negative.   Past Medical History:  Diagnosis Date   Arthritis    hands   CAD (coronary artery disease)    a. 2010: stents pRCA and dRCA. b. LHC 12/13/13: Minimal distal LM dz. LAD 95% ostial w/ L-->R collat. LCx mild dz. RCA 95% mid-dRCA. Patent stents proximal and distal RCA. EF not assessed, Nl by nuclear study.      Central retinal vein occlusion, right eye    Chronic kidney disease    hx kidney stone   Depression    Diplopia    Ear pain    right   Eosinophilic esophagitis    Essential hypertension    GERD (gastroesophageal reflux disease)    H/O: whooping cough     Hyperlipidemia with target LDL less than 70    Incontinence    urine   Stroke ([place]) 2001   reesultant Rt spastic hemplegia and aphasia     Past Surgical History:  Procedure Laterality Date   ABDOMINAL HYSTERECTOMY     CAROTID STENT     cataracts     COLONOSCOPY N/A 10/05/2015   Procedure: COLONOSCOPY;  Surgeon: Manus Gunning, MD;  Location: [facility];  Service: Gastroenterology;  Laterality: N/A;   CORONARY ARTERY BYPASS GRAFT N/A 12/17/2013   Procedure: CORONARY ARTERY BYPASS GRAFTING (CABG);  Surgeon: Gaye Pollack, MD;  Location: [facility];  Service: Open Heart Surgery;  Laterality: N/A;  Times 2 using left internal mammary artery and endoscopically harvested right saphenous vein   ESOPHAGOGASTRODUODENOSCOPY     ESOPHAGOGASTRODUODENOSCOPY N/A 10/05/2015   Procedure: ESOPHAGOGASTRODUODENOSCOPY (EGD);  Surgeon: Manus Gunning, MD;  Location: [facility];  Service: Gastroenterology;  Laterality: N/A;   INTRAOPERATIVE TRANSESOPHAGEAL ECHOCARDIOGRAM N/A 12/17/2013   Procedure: INTRAOPERATIVE TRANSESOPHAGEAL ECHOCARDIOGRAM;  Surgeon: Gaye Pollack, MD;  Location: [facility] OR;  Service: Open Heart Surgery;  Laterality: N/A;   lasix     LEFT HEART CATHETERIZATION WITH CORONARY ANGIOGRAM N/A 12/13/2013   Procedure: LEFT HEART CATHETERIZATION WITH CORONARY ANGIOGRAM;  Surgeon: Jettie Booze, MD;  Location: [facility] CATH LAB;  Service: Cardiovascular;  Laterality: N/A;   retina injection       reports that she has never smoked. She has never  used smokeless tobacco. She reports current alcohol use. She reports that she does not use drugs.  No Known Allergies  Family History  Problem Relation Age of Onset   Diabetes Mother    Heart disease Mother    Heart attack Father    Breast cancer Sister     Prior to Admission medications   Medication Sig Start Date End Date Taking? Authorizing Provider  aspirin 81 MG tablet Take 81 mg by mouth daily.    [provider]   atorvastatin (LIPITOR) 40 MG tablet tAKE ONE TABLET BY MOUTH DAILY. 03/13/22   Lauree Chandler, NP  Cholecalciferol (VITAMIN D3) 50 MCG (2000 UT) capsule Take 2,000 Units by mouth daily.    [provider]  clopidogrel (PLAVIX) 75 MG tablet TAKE ONE TABLET BY MOUTH DAILY 09/10/21   Lauree Chandler, NP  Coenzyme Q10 (COQ-10 PO) Take 1 tablet daily by mouth.    [provider]  desmopressin (DDAVP) 0.2 MG tablet Take 0.4 mg by mouth at bedtime.    [provider]  diclofenac Sodium (VOLTAREN) 1 % GEL APPLY TO THE AFFECTED AREA(S) THREE TIMES DAILY. 02/13/21   Raulkar, Clide Deutscher, MD  docusate sodium (COLACE) 100 MG capsule Take 100 mg by mouth daily.    [provider]  DULoxetine (CYMBALTA) 20 MG capsule TAKE ONE CAPSULE BY MOUTH DAILY 12/13/21   Lauree Chandler, NP  estradiol (ESTRACE) 0.1 MG/GM vaginal cream  03/02/20   [provider]  gabapentin (NEURONTIN) 300 MG capsule TAKE ONE CAPSULE BY MOUTH THREE TIMES DAILY 12/10/21   Raulkar, Clide Deutscher, MD  hydrocortisone (ANUSOL-HC) 2.5 % rectal cream Place 1 Application rectally 2 (two) times daily. 12/14/21   Gatha Mayer, MD  methocarbamol (ROBAXIN) 500 MG tablet Take 1 tablet (500 mg total) by mouth every 8 (eight) hours as needed for muscle spasms. 03/30/21   Ngetich, Dinah C, NP  mirabegron ER (MYRBETRIQ) 50 MG TB24 tablet Take 1 tablet by mouth daily. 08/25/20   [provider]  nitroGLYCERIN (NITROSTAT) 0.4 MG SL tablet Place 1 tablet (0.4 mg total) under the tongue every 5 (five) minutes as needed for chest pain. 10/30/21   Lauree Chandler, NP  omeprazole (PRILOSEC) 20 MG capsule TAKE ONE CAPSULE BY MOUTH DAILY 12/14/21   Lauree Chandler, NP  trimethoprim (TRIMPEX) 100 MG tablet Take 100 mg by mouth daily. 04/29/22   [provider]  TURMERIC PO Take 1 tablet by mouth daily.     [provider]    Physical Exam: Vitals:   05/22/22 2115 05/22/22 2215 05/22/22  2245 05/22/22 2350  BP: 125/71 133/73 134/65 127/66  Pulse: 76 70 68 72  Resp: '18 18 18 18  '$ Temp:    98.5 F (36.9 C)  TempSrc:    Oral  SpO2: 95% 97% 97% 95%    Physical Exam Vitals reviewed.  Constitutional:      General: She is not in acute distress. HENT:     Head: Normocephalic and atraumatic.  Eyes:     Extraocular Movements: Extraocular movements intact.  Cardiovascular:     Rate and Rhythm: Normal rate and regular rhythm.     Pulses: Normal pulses.  Pulmonary:     Effort: Pulmonary effort is normal. No respiratory distress.     Breath sounds: Normal breath sounds. No wheezing or rales.  Abdominal:     General: Bowel sounds are normal. There is no distension.     Palpations:  Abdomen is soft.     Tenderness: There is no abdominal tenderness.  Musculoskeletal:     Cervical back: Normal range of motion.     Right lower leg: Edema present.     Left lower leg: Edema present.     Comments: +1 to +2 bilateral lower extremity pitting edema  Skin:    General: Skin is warm and dry.  Neurological:     General: No focal deficit present.     Mental Status: She is alert and oriented to person, place, and time.     Labs on Admission: I have personally reviewed following labs and imaging studies  CBC: Recent Labs  Lab 05/22/22 2030 05/23/22 0020  WBC 6.5 5.0  NEUTROABS 4.5  --   HGB 13.8 8.8*  HCT 43.6 28.7*  MCV 93.2 95.3  PLT 256 412   Basic Metabolic Panel: Recent Labs  Lab 05/22/22 2030  NA 138  K 4.1  CL 101  CO2 27  GLUCOSE 112*  BUN 26*  CREATININE 0.92  CALCIUM 9.2   GFR: CrCl cannot be calculated (Unknown ideal weight.). Liver Function Tests: Recent Labs  Lab 05/22/22 2039  AST 19  ALT 12  ALKPHOS 70  BILITOT 0.7  PROT 7.4  ALBUMIN 4.1   No results for input(s): "LIPASE", "AMYLASE" in the last 168 hours. No results for input(s): "AMMONIA" in the last 168 hours. Coagulation Profile: Recent Labs  Lab 05/22/22 2135  INR 1.1    Cardiac Enzymes: No results for input(s): "CKTOTAL", "CKMB", "CKMBINDEX", "TROPONINI" in the last 168 hours. BNP (last 3 results) No results for input(s): "PROBNP" in the last 8760 hours. HbA1C: No results for input(s): "HGBA1C" in the last 72 hours. CBG: No results for input(s): "GLUCAP" in the last 168 hours. Lipid Profile: No results for input(s): "CHOL", "HDL", "LDLCALC", "TRIG", "CHOLHDL", "LDLDIRECT" in the last 72 hours. Thyroid Function Tests: No results for input(s): "TSH", "T4TOTAL", "FREET4", "T3FREE", "THYROIDAB" in the last 72 hours. Anemia Panel: No results for input(s): "VITAMINB12", "FOLATE", "FERRITIN", "TIBC", "IRON", "RETICCTPCT" in the last 72 hours. Urine analysis:    Component Value Date/Time   COLORURINE YELLOW 10/21/2019 0948   APPEARANCEUR CLEAR 10/21/2019 0948   LABSPEC 1.019 10/21/2019 0948   PHURINE 6.0 10/21/2019 0948   GLUCOSEU NEGATIVE 10/21/2019 0948   HGBUR 2+ (A) 10/21/2019 0948   BILIRUBINUR NEGATIVE 07/07/2019 0124   KETONESUR NEGATIVE 10/21/2019 0948   PROTEINUR NEGATIVE 10/21/2019 0948   UROBILINOGEN 0.2 12/09/2013 1454   NITRITE NEGATIVE 10/21/2019 0948   LEUKOCYTESUR NEGATIVE 10/21/2019 0948    Radiological Exams on Admission: No results found.  Assessment and Plan  Acute lower GI bleed Acute blood loss anemia Patient presenting with painless rectal bleeding.  ED physician appreciated external hemorrhoids on rectal exam with some visible blood.  No further episodes of hematochezia in the ED and patient remains hemodynamically stable.  However, hemoglobin dropped from 13.8 on initial labs to 8.8 on repeat labs done in 4 hours.  Patient is on aspirin and Plavix. Last colonoscopy done in June 2017 showing internal and external hemorrhoids, polyps in the cecum and rectum which were removed, and diverticulosis of the entire examined colon.  Suspect bleeding is likely related to diverticulosis versus hemorrhoids. -GI consulted by ED  physician (Dr. Carlean Purl) -Keep n.p.o. -IV fluid hydration -Hold aspirin and Plavix -Type and screen -Serial H&H every 4 hours -Transfuse PRBCs if hemoglobin less than 7.  Discussed with the patient and she is okay with receiving blood  transfusions if needed.  AKI Likely prerenal in etiology in the setting of acute GI bleed.  BUN 26, creatinine 0.9 (baseline 0.4-0.6). -IV fluid hydration -Monitor renal function -Avoid nephrotoxic agents  CAD status post PCI and CABG -Hold aspirin and Plavix given acute GI bleed  Hypertension Blood pressure stable. -Avoid antihypertensives at this time in the setting of acute GI bleed  History of stroke with residual right-sided hemiplegia and expressive aphasia -Hold aspirin and Plavix given acute GI bleed  Depression GERD Hyperlipidemia -Pharmacy med rec pending.  DVT prophylaxis: SCDs Code Status: DNR/DNI (confirmed with the patient)      Family Communication: No family available at this time. Consults called: GI Level of care: Progressive Care Unit Admission status: It is my clinical opinion that referral for OBSERVATION is reasonable and necessary in this patient based on the above information provided. The aforementioned taken together are felt to place the patient at high risk for further clinical deterioration. However, it is anticipated that the patient may be medically stable for discharge from the hospital within 24 to 48 hours.   Shela Leff MD [facility]  If 7PM-7AM, please contact night-coverage [email or web address]  05/23/2022, 1:49 AM

## 2022-05-24 DIAGNOSIS — K5731 Diverticulosis of large intestine without perforation or abscess with bleeding: Secondary | ICD-10-CM

## 2022-05-24 DIAGNOSIS — D62 Acute posthemorrhagic anemia: Secondary | ICD-10-CM | POA: Diagnosis not present

## 2022-05-24 DIAGNOSIS — Z7902 Long term (current) use of antithrombotics/antiplatelets: Secondary | ICD-10-CM | POA: Diagnosis not present

## 2022-05-24 DIAGNOSIS — K922 Gastrointestinal hemorrhage, unspecified: Secondary | ICD-10-CM | POA: Diagnosis not present

## 2022-05-24 LAB — BASIC METABOLIC PANEL
Anion gap: 6 (ref 5–15)
BUN: 9 mg/dL (ref 8–23)
CO2: 25 mmol/L (ref 22–32)
Calcium: 8.5 mg/dL — ABNORMAL LOW (ref 8.9–10.3)
Chloride: 106 mmol/L (ref 98–111)
Creatinine, Ser: 0.49 mg/dL (ref 0.44–1.00)
GFR, Estimated: >60 mL/min (ref >60)
Glucose, Bld: 120 mg/dL — ABNORMAL HIGH (ref 70–99)
Potassium: 3.8 mmol/L (ref 3.5–5.1)
Sodium: 137 mmol/L (ref 135–145)

## 2022-05-24 LAB — MAGNESIUM: Magnesium: 1.7 mg/dL (ref 1.7–2.4)

## 2022-05-24 LAB — CBC WITH DIFFERENTIAL/PLATELET
Abs Immature Granulocytes: 0.03 10*3/uL (ref 0.00–0.07)
Basophils Absolute: 0 10*3/uL (ref 0.0–0.1)
Basophils Relative: 0 %
Eosinophils Absolute: 0.2 10*3/uL (ref 0.0–0.5)
Eosinophils Relative: 3 %
HCT: 39.5 % (ref 36.0–46.0)
Hemoglobin: 12.8 g/dL (ref 12.0–15.0)
Immature Granulocytes: 1 %
Lymphocytes Relative: 11 %
Lymphs Abs: 0.7 10*3/uL (ref 0.7–4.0)
MCH: 29.2 pg (ref 26.0–34.0)
MCHC: 32.4 g/dL (ref 30.0–36.0)
MCV: 90.2 fL (ref 80.0–100.0)
Monocytes Absolute: 0.5 10*3/uL (ref 0.1–1.0)
Monocytes Relative: 8 %
Neutro Abs: 5 10*3/uL (ref 1.7–7.7)
Neutrophils Relative %: 77 %
Platelets: 221 10*3/uL (ref 150–400)
RBC: 4.38 MIL/uL (ref 3.87–5.11)
RDW: 12.6 % (ref 11.5–15.5)
WBC: 6.4 10*3/uL (ref 4.0–10.5)
nRBC: 0 % (ref 0.0–0.2)

## 2022-05-24 LAB — PHOSPHORUS: Phosphorus: 3.6 mg/dL (ref 2.5–4.6)

## 2022-05-24 MED ORDER — CLOPIDOGREL BISULFATE 75 MG PO TABS
75.0000 mg | ORAL_TABLET | Freq: Every day | ORAL | Status: DC
  Administered 2022-05-24: 75 mg via ORAL
  Filled 2022-05-24: qty 1

## 2022-05-24 MED ORDER — POLYETHYLENE GLYCOL 3350 17 G PO PACK
17.0000 g | PACK | Freq: Every day | ORAL | Status: DC
  Administered 2022-05-24: 17 g via ORAL
  Filled 2022-05-24: qty 1

## 2022-05-24 MED ORDER — POLYETHYLENE GLYCOL 3350 17 G PO PACK: 17.0000 g | PACK | Freq: Every day | ORAL | 0 refills | Status: DC

## 2022-05-24 MED ORDER — ASPIRIN 81 MG PO TBEC
81.0000 mg | DELAYED_RELEASE_TABLET | Freq: Every day | ORAL | Status: DC
  Administered 2022-05-24: 81 mg via ORAL
  Filled 2022-05-24: qty 1

## 2022-05-24 NOTE — Evaluation (Signed)
Physical Therapy Evaluation Patient Details Name: Kristin [name] MRN: [id number] DOB: 1940-08-03 Today's Date: 05/24/2022  History of Present Illness  Pt is 82 yo female admitted 05/22/22 with acute lower GI bleed.  Pt with hx of CVA 2001 with R hemiplegia and expressive aphasia, CAD s/p PCI/stenting and CABG, nephrolithiasis  Clinical Impression  Pt admitted with above diagnosis. At baseline, pt resides at ILF and can ambulate in apartment with rollator then scooter for longer distances.  She is normally able to perform all of her ADLs (sits for showers).  Pt does have an AFO (which is not here) and a lift chair that she sleeps in at home.  Daughter present during session.  Today, pt requiring mod A for all transfers and up to mod A for balance when standing.  Pt requiring bed rail for support with transfers and felt unsafe using RW (uses rollator at home).  Additionally pt required assist to pull up underwear after toileting.  Pt is somewhat limited due to different set up (doesn't have AFO, tried with RW instead rollator, uses lift chair); however, after 2 days in bed and not eating she is weaker and below baseline mobility.  Pt expressing that she would rather return to ILF at d/c - discussed if doing this therapy recommends 24 hr support.  If not would need SNF.  Pt and daughter to discuss.  Pt currently with functional limitations due to the deficits listed below (see PT Problem List). Pt will benefit from skilled PT to increase their independence and safety with mobility to allow discharge to the venue listed below.          Recommendations for follow up therapy are one component of a multi-disciplinary discharge planning process, led by the attending physician.  Recommendations may be updated based on patient status, additional functional criteria and insurance authorization.  Follow Up Recommendations Skilled nursing-short term rehab (<3 hours/day) (unless can arrange 24 hr support from family, HHPT,  at [place]) Can patient physically be transported by private vehicle: No    Assistance Recommended at Discharge Frequent or constant Supervision/Assistance  Patient can return home with the following  A lot of help with walking and/or transfers;A lot of help with bathing/dressing/bathroom    Equipment Recommendations None recommended by PT  Recommendations for Other Services       Functional Status Assessment Patient has had a recent decline in their functional status and demonstrates the ability to make significant improvements in function in a reasonable and predictable amount of time.     Precautions / Restrictions Precautions Precautions: Fall      Mobility  Bed Mobility Overal bed mobility: Needs Assistance Bed Mobility: Supine to Sit, Sit to Supine     Supine to sit: Mod assist Sit to supine: Mod assist   General bed mobility comments: Mod A but pt typically sleeps in lift chair    Transfers Overall transfer level: Needs assistance Equipment used: Rolling walker (2 wheels) (bed rails) Transfers: Sit to/from Stand, Bed to chair/wheelchair/BSC Sit to Stand: Mod assist Stand pivot transfers: Mod assist         General transfer comment: Pt stood from bed with mod A and RW and bed elevated, she did not feel steady with RW.  Returned to sitting and stood holding bed rail with mod A then pivoted to [facility] holding rail and armrest. .  She then stood from [facility] x 3 but required bed rail to pull up on and mod A to stabilize.  Required assist with toielting ADLs (pulling up underwear).  Again mod A to pivot back to bed    Ambulation/Gait               General Gait Details: unable to take steps today; weak; not comfortable with RW; anxious; does not have AFO  Stairs            Wheelchair Mobility    Modified Rankin (Stroke Patients Only)       Balance Overall balance assessment: Needs assistance Sitting-balance support: No upper extremity supported Sitting  balance-Leahy Scale: Fair     Standing balance support: Single extremity supported Standing balance-Leahy Scale: Poor Standing balance comment: Requriing UE support and min to mod A                             Pertinent Vitals/Pain Pain Assessment Pain Assessment: No/denies pain    Home Living Family/patient expects to be discharged to:: Other (Comment) (Pt from [place] [facility])                 Home Equipment: Rollator (4 wheels);Shower seat;BSC/3in1;Electric scooter Additional Comments: Daughter provided history    Prior Function Prior Level of Function : Independent/Modified Independent             Mobility Comments: Pt typically ambulates in apartment with rollator independently and scooter for longer distance ADLs Comments: Pt independent with ADLs including dressing, showers on seat, and donning AFO.     Hand Dominance        Extremity/Trunk Assessment   Upper Extremity Assessment Upper Extremity Assessment: RUE deficits/detail;LUE deficits/detail RUE Deficits / Details: MMT 0-1/5 throughout; limited ROM: fingers contracted but pt able to open to fit on RW LUE Deficits / Details: Grossly WFL    Lower Extremity Assessment Lower Extremity Assessment: LLE deficits/detail;RLE deficits/detail RLE Deficits / Details: chronic deficits from CVA; tends to keep knee extended and foot plantar flexed but able to PROM/stretch to 90 degrees knee flexion and foot flat; has AFO at home LLE Deficits / Details: Grossly WFL    Cervical / Trunk Assessment Cervical / Trunk Assessment: Kyphotic  Communication   Communication: Expressive difficulties (significant expressive aphasia)  Cognition Arousal/Alertness: Awake/alert Behavior During Therapy: WFL for tasks assessed/performed Overall Cognitive Status: Difficult to assess                                 General Comments: Appears baseline, follows commands, nods yes/no;  occasionally says "yes or I don;t know" but otherwise signficant aphasia        General Comments General comments (skin integrity, edema, etc.): Pt's daughter present throughout session.  Discussed pt with increased difficulty moving and from mobility perspective not safe to return to ILF alone.  Did acknowledge that some difficulty due to different set up but pt also weaker after 2 days in bed and limited diet.  Recommended either SNF or ILF with 24 hr support and HHPT.    Exercises     Assessment/Plan    PT Assessment Patient needs continued PT services  PT Problem List Decreased strength;Decreased coordination;Decreased range of motion;Cardiopulmonary status limiting activity;Decreased activity tolerance;Decreased knowledge of use of DME;Decreased balance;Decreased safety awareness;Decreased mobility;Decreased knowledge of precautions       PT Treatment Interventions DME instruction;Therapeutic exercise;Gait training;Balance training;Functional mobility training;Therapeutic activities;Patient/family education;Wheelchair mobility training    PT Goals (Current  goals can be found in the Care Plan section)  Acute Rehab PT Goals Patient Stated Goal: Difficult to say b/c of aphasia but did express wants to return to ILF PT Goal Formulation: With patient/family Time For Goal Achievement: 06/07/22 Potential to Achieve Goals: Good    Frequency Min 3X/week     Co-evaluation               AM-PAC PT "6 Clicks" Mobility  Outcome Measure Help needed turning from your back to your side while in a flat bed without using bedrails?: A Little Help needed moving from lying on your back to sitting on the side of a flat bed without using bedrails?: A Lot Help needed moving to and from a bed to a chair (including a wheelchair)?: A Lot Help needed standing up from a chair using your arms (e.g., wheelchair or bedside chair)?: A Lot Help needed to walk in hospital room?: Total Help needed  climbing 3-5 steps with a railing? : Total 6 Click Score: 11    End of Session Equipment Utilized During Treatment: Gait belt Activity Tolerance: Patient tolerated treatment well Patient left: in bed;with call bell/phone within reach;with bed alarm set Nurse Communication: Mobility status PT Visit Diagnosis: Other abnormalities of gait and mobility (R26.89);Muscle weakness (generalized) (M62.81)    Time: 0370-4888 PT Time Calculation (min) (ACUTE ONLY): 38 min   Charges:   PT Evaluation $PT Eval Low Complexity: 1 Low PT Treatments $Therapeutic Activity: 23-37 mins        Abran Richard, PT Acute Rehab [facility] [phone]   Karlton Lemon 05/24/2022, 1:04 PM

## 2022-05-24 NOTE — Progress Notes (Signed)
PT Cancellation Note  Patient Details Name: Kristin [name] MRN: [id number] DOB: [birth year]   Cancelled Treatment:    Reason Eval/Treat Not Completed: Other (comment) Attempted as imminent d/c order.  Pt was on phone.  Will f/u after next pt. Abran Richard, PT Acute Rehab [facility] [phone]   Karlton Lemon 05/24/2022, 11:14 AM

## 2022-05-24 NOTE — Discharge Summary (Signed)
Discharge Summary  Kristin [name] MPN:[id number] DOB: April 30, 1940  PCP: Lauree Chandler, NP  Admit date: 05/22/2022 Discharge date: 05/24/2022   Time spent:  79mns  Recommendations for Outpatient Follow-up:  F/u with PCP within a week  for hospital discharge follow up, repeat cbc/bmp at follow up    Discharge Diagnoses:  Active Hospital Problems   Diagnosis Date Noted   Acute lower GI bleeding 05/23/2022   CAD (coronary artery disease) 12/09/2013    Priority: High   Acute blood loss anemia 05/23/2022   [place]   History of stroke 02/04/2017   Essential hypertension     Resolved Hospital Problems  No resolved problems to display.    Discharge Condition: stable  Diet recommendation: heart healthy  There were no vitals filed for this visit.  History of present illness: ( per admitting MD Dr RMarlowe Sax  Chief Complaint: Rectal bleeding   HPI: MAaradhya [name] a 82y.o. female with medical history significant of CAD status post PCI and CABG, depression, hypertension, GERD, hyperlipidemia, stroke with residual right-sided hemiplegia and expressive aphasia, hemorrhoids, diverticulosis, and other medical comorbidities presented to the ED with rectal bleeding/bright red blood per rectum.  She is on aspirin and Plavix.  In the ED, patient remained hemodynamically stable.  Initial labs showing hemoglobin 13.8, BUN 26, creatinine 0.9 (baseline 0.4-0.6). ED physician did appreciate external hemorrhoids on rectal exam with some visible blood.  Patient did not have any further episodes of hematochezia in the ED.  However, hemoglobin dropped to 8.8 on repeat labs done in 4 hours. GI consulted by ED physician (Dr. GCarlean Purl.  TR[facility] called to admit.   History limited as patient has residual expressive aphasia from prior stroke.  She reports 3 episodes of rectal bleeding at home.  Denies hematemesis or abdominal pain.  She is on aspirin and Plavix.  Denies dizziness,  chest pain, or shortness of breath.  She has no other complaints. Hospital Course:  Principal Problem:   Acute lower GI bleeding Active Problems:   CAD (coronary artery disease)   Essential hypertension   [place]) [place]   History of stroke   Acute blood loss anemia   Assessment and Plan:   Acute lower GI bleed, rectal bleeding/bright red blood per rectum Acute blood loss anemia -Has some left lower abdominal discomfort on exam, last colonoscopy in 2017 with  diverticulosis internal and external hemorrhoids -Hemodynamically stable -hemoglobin from 13.8--> 8.8 (likely erroneous)--> 11.6--> 11.9> 12.8  - aspirin and Plavix held in the hospital, no more bleeding, seen by Gi cleared to go back on asa and plavix, cleared to go home      CAD status post PCI to RCV on 2010 and CABG in 2015 -No chest pain -not on beta-blocker due to history of bradycardia on beta-blocker - aspirin and Plavix held in the hospital given acute GI bleed, resumed at discharge after oked with GI -Continue Lipitor      History of stroke with residual right-sided hemiplegia and expressive aphasia -aspirin and Plavix held in the hospital given acute GI bleed, resumed at discharge after oked with GI  -Continue statin   OAB with mixed urinary incontinence and nocturnal polyuria. - Taking Myrbetriq 50 mg daily and DDAVP 0.2 mg nightly per last urology note -Sodium  wnl  H/o nephrolithiasis ,recurrent UTI.  - Taking Trimethoprim '100mg'$  daily and uses topical vaginal estrogen cream 2-3x/week for UTI prophylaxis.    Discharge Exam: BP (!) 144/78 (  BP Location: Left Arm)   Pulse 78   Temp 97.7 F (36.5 C) (Oral)   Resp 18   SpO2 95%   General: NAD, pleasant , baseline  right hemiplegia and expressive aphasia  Cardiovascular: RRR Respiratory: normal respiratory effort     Discharge Instructions     Diet - low sodium heart healthy   Complete by: As directed    Increase activity slowly    Complete by: As directed       Allergies as of 05/24/2022   No Known Allergies      Medication List     TAKE these medications    acetaminophen 500 MG tablet Commonly known as: TYLENOL Take 1,000 mg by mouth in the morning, at noon, and at bedtime.   aspirin 81 MG tablet Take 81 mg by mouth daily.   atorvastatin 40 MG tablet Commonly known as: LIPITOR tAKE ONE TABLET BY MOUTH DAILY. What changed: when to take this   clopidogrel 75 MG tablet Commonly known as: PLAVIX TAKE ONE TABLET BY MOUTH DAILY   COQ-10 PO Take 1 tablet daily by mouth.   desmopressin 0.2 MG tablet Commonly known as: DDAVP Take 0.4 mg by mouth at bedtime.   diclofenac Sodium 1 % Gel Commonly known as: VOLTAREN APPLY TO THE AFFECTED AREA(S) THREE TIMES DAILY.   docusate sodium 100 MG capsule Commonly known as: COLACE Take 100 mg by mouth daily.   DULoxetine 20 MG capsule Commonly known as: CYMBALTA TAKE ONE CAPSULE BY MOUTH DAILY   estradiol 0.1 MG/GM vaginal cream Commonly known as: ESTRACE Place 1 Applicatorful vaginally 3 (three) times a week.   gabapentin 300 MG capsule Commonly known as: NEURONTIN TAKE ONE CAPSULE BY MOUTH THREE TIMES DAILY   hydrocortisone 2.5 % rectal cream Commonly known as: ANUSOL-HC Place 1 Application rectally 2 (two) times daily. What changed:  when to take this reasons to take this   methocarbamol 500 MG tablet Commonly known as: ROBAXIN Take 1 tablet (500 mg total) by mouth every 8 (eight) hours as needed for muscle spasms. What changed: when to take this   mirabegron ER 50 MG Tb24 tablet Commonly known as: MYRBETRIQ Take 50 mg by mouth daily.   nitroGLYCERIN 0.4 MG SL tablet Commonly known as: Nitrostat Place 1 tablet (0.4 mg total) under the tongue every 5 (five) minutes as needed for chest pain.   omeprazole 20 MG capsule Commonly known as: PRILOSEC TAKE ONE CAPSULE BY MOUTH DAILY   polyethylene glycol 17 g packet Commonly known as:  MIRALAX / GLYCOLAX Take 17 g by mouth daily.   trimethoprim 100 MG tablet Commonly known as: TRIMPEX Take 100 mg by mouth at bedtime.   TURMERIC PO Take 1 tablet by mouth daily.   Vitamin D3 50 MCG (2000 UT) capsule Take 2,000 Units by mouth at bedtime.       No Known Allergies  Follow-up Information     Lauree Chandler, NP Follow up in 1 week(s).   Specialty: Geriatric Medicine Why: hospital discharge follow up, repeat basic lab work including cbc/bmp at follow up Contact information: [address]. [place] [address] [phone]                  The results of significant diagnostics from this hospitalization (including imaging, microbiology, ancillary and laboratory) are listed below for reference.    Significant Diagnostic Studies: No results found.  Microbiology: No results found for this or any previous visit (from the  past 240 hour(s)).   Labs: Basic Metabolic Panel: Recent Labs  Lab 05/22/22 2030 05/23/22 0305 05/24/22 0340  NA 138 138 137  K 4.1 3.6 3.8  CL 101 106 106  CO2 '27 25 25  '$ GLUCOSE 112* 118* 120*  BUN 26* 26* 9  CREATININE 0.92 0.60 0.49  CALCIUM 9.2 8.4* 8.5*  MG  --   --  1.7  PHOS  --   --  3.6   Liver Function Tests: Recent Labs  Lab 05/22/22 2039  AST 19  ALT 12  ALKPHOS 70  BILITOT 0.7  PROT 7.4  ALBUMIN 4.1   No results for input(s): "LIPASE", "AMYLASE" in the last 168 hours. No results for input(s): "AMMONIA" in the last 168 hours. CBC: Recent Labs  Lab 05/22/22 2030 05/23/22 0020 05/23/22 0305 05/23/22 0639 05/24/22 0340  WBC 6.5 5.0  --   --  6.4  NEUTROABS 4.5  --   --   --  5.0  HGB 13.8 8.8* 11.6* 11.9* 12.8  HCT 43.6 28.7* 36.9 37.7 39.5  MCV 93.2 95.3  --   --  90.2  PLT 256 167  --   --  221   Cardiac Enzymes: No results for input(s): "CKTOTAL", "CKMB", "CKMBINDEX", "TROPONINI" in the last 168 hours. BNP: BNP (last 3 results) No results for input(s): "BNP" in the last 8760  hours.  ProBNP (last 3 results) No results for input(s): "PROBNP" in the last 8760 hours.  CBG: No results for input(s): "GLUCAP" in the last 168 hours.  FURTHER DISCHARGE INSTRUCTIONS:   Get Medicines reviewed and adjusted: Please take all your medications with you for your next visit with your Primary MD   Laboratory/radiological data: Please request your Primary MD to go over all hospital tests and procedure/radiological results at the follow up, please ask your Primary MD to get all Hospital records sent to his/her office.   In some cases, they will be blood work, cultures and biopsy results pending at the time of your discharge. Please request that your primary care M.D. goes through all the records of your hospital data and follows up on these results.   Also Note the following: If you experience worsening of your admission symptoms, develop shortness of breath, life threatening emergency, suicidal or homicidal thoughts you must seek medical attention immediately by calling 911 or calling your MD immediately  if symptoms less severe.   You must read complete instructions/literature along with all the possible adverse reactions/side effects for all the Medicines you take and that have been prescribed to you. Take any new Medicines after you have completely understood and accpet all the possible adverse reactions/side effects.    Do not drive when taking Pain medications or sleeping medications (Benzodaizepines)   Do not take more than prescribed Pain, Sleep and Anxiety Medications. It is not advisable to combine anxiety,sleep and pain medications without talking with your primary care practitioner   Special Instructions: If you have smoked or chewed Tobacco  in the last 2 yrs please stop smoking, stop any regular Alcohol  and or any Recreational drug use.   Wear Seat belts while driving.   Please note: You were cared for by a hospitalist during your hospital stay. Once you are  discharged, your primary care physician will handle any further medical issues. Please note that NO REFILLS for any discharge medications will be authorized once you are discharged, as it is imperative that you return to your primary care physician (or establish  a relationship with a primary care physician if you do not have one) for your post hospital discharge needs so that they can reassess your need for medications and monitor your lab values.     Signed:  Florencia Reasons MD, PhD, FACP  [facility] 05/24/2022, 9:59 AM

## 2022-05-24 NOTE — Progress Notes (Addendum)
    Progress Note   Subjective  Day #1 Chief Complaint: Lower GI bleed  Patient does have expressive aphasia so history is limited but her daughters in the room.  She reports that she has not had any further bloody bowel movements since being hospitalized.  In general she continues a little bit of abdominal discomfort, but she would be happy to go home.   Objective   Vital signs in last 24 hours: Temp:  [97.6 F (36.4 C)-97.7 F (36.5 C)] 97.7 F (36.5 C) (02/02 0033) Pulse Rate:  [71-84] 78 (02/02 0302) Resp:  [18-21] 18 (02/02 0339) BP: (144-171)/(71-90) 144/78 (02/02 0339) SpO2:  [95 %-99 %] 95 % (02/02 0033)   General:    Elderly white female in NAD +expressive aphasia Heart:  Regular rate and rhythm; no murmurs Lungs: Respirations even and unlabored, lungs CTA bilaterally Abdomen:  Soft, mild generalized ttp and nondistended. Normal bowel sounds. Psych:  Cooperative. Normal mood and affect.  Intake/Output from previous day: 02/01 0701 - 02/02 0700 In: 360 [P.O.:360] Out: 250 [Urine:250]   Lab Results: Recent Labs    05/22/22 2030 05/23/22 0020 05/23/22 0305 05/23/22 0639 05/24/22 0340  WBC 6.5 5.0  --   --  6.4  HGB 13.8 8.8* 11.6* 11.9* 12.8  HCT 43.6 28.7* 36.9 37.7 39.5  PLT 256 167  --   --  221   BMET Recent Labs    05/22/22 2030 05/23/22 0305 05/24/22 0340  NA 138 138 137  K 4.1 3.6 3.8  CL 101 106 106  CO2 '27 25 25  '$ GLUCOSE 112* 118* 120*  BUN 26* 26* 9  CREATININE 0.92 0.60 0.49  CALCIUM 9.2 8.4* 8.5*   LFT Recent Labs    05/22/22 2039  PROT 7.4  ALBUMIN 4.1  AST 19  ALT 12  ALKPHOS 70  BILITOT 0.7  BILIDIR 0.1  IBILI 0.6   PT/INR Recent Labs    05/22/22 2135  LABPROT 14.0  INR 1.1    Assessment / Plan:   Assessment: 1.  Lower GI bleed: With initial drop in hemoglobin from 13.8--> 11.6, now increasing overnight, no further bleeding, last colonoscopy in 2017 was rectal prolapse tissue and diverticulosis, Plavix and aspirin  held this hospitalization; most likely diverticular versus rectal prolapse tissue versus other 2.  Acute blood loss anemia: Hemoglobin increasing overnight 11.6--> 11.9--> 12.8, with above 3.  AKI: Resolved 4.  CAD status post PCI and CABG: On Aspirin and Plavix-held overnight 5.  History of stroke with residual right-sided hemiplegia and expressive aphasia 6.  Depression 7.  GERD  Plan: 1.  Hemoglobin has remained stable overnight with no further signs of bleeding. 2.  At this point can restart Plavix and aspirin 3.  From a GI standpoint patient can be discharged home.  Thank for your kind consultation, we will sign off.   LOS: 0 days   Levin Erp  05/24/2022, 10:19 AM  GI ATTENDING  Interval history data reviewed.  Agree with interval progress note as outlined above.  The patient was discharged home earlier today.  Docia Chuck. Geri Seminole., M.D. [facility] Division of Gastroenterology

## 2022-05-24 NOTE — Plan of Care (Signed)
  Problem: Education: Goal: Knowledge of General Education information will improve Description: Including pain rating scale, medication(s)/side effects and non-pharmacologic comfort measures Outcome: Completed/Met   Problem: Pain Managment: Goal: General experience of comfort will improve Outcome: Completed/Met   

## 2022-05-24 NOTE — TOC Transition Note (Signed)
Transition of Care [facility]) - CM/SW Discharge Note   Patient Details  Name: Kristin [name] MRN: [id number] Date of Birth: [birth year]  Transition of Care [facility]) CM/SW Contact:  Illene Regulus, LCSW Phone Number: 05/24/2022, 11:39 AM   Clinical Narrative:    CSW spoke with pt's daughter Truman Hayward regarding recommendations for [facility] services . Pt's daughter reported pt has [facility] already arranged. CSW spoke with Rollene Fare with [facility] who confirmed pt's HHPT and needing new orders faxed. CSW faxed out [facility] orders. No additional TOC needs TOC sign off.    Final next level of care: Home w Home Health Services Barriers to Discharge: No Barriers Identified   Patient Goals and CMS Choice CMS Medicare.gov Compare Post Acute Care list provided to:: Patient Choice offered to / list presented to : Patient  Discharge Placement                      Patient and family notified of of transfer: 05/24/22  Discharge Plan and Services Additional resources added to the After Visit Summary for                                       Social Determinants of Health (SDOH) Interventions SDOH Screenings   Food Insecurity: No Food Insecurity (05/23/2022)  Housing: Low Risk  (05/23/2022)  Transportation Needs: No Transportation Needs (05/23/2022)  Utilities: Not At Risk (05/23/2022)  Depression (PHQ2-9): Low Risk  (05/07/2022)  Tobacco Use: Low Risk  (05/23/2022)     Readmission Risk Interventions     No data to display

## 2022-05-27 DIAGNOSIS — R278 Other lack of coordination: Secondary | ICD-10-CM | POA: Diagnosis not present

## 2022-05-27 DIAGNOSIS — M6281 Muscle weakness (generalized): Secondary | ICD-10-CM | POA: Diagnosis not present

## 2022-05-27 DIAGNOSIS — I69398 Other sequelae of cerebral infarction: Secondary | ICD-10-CM | POA: Diagnosis not present

## 2022-05-28 DIAGNOSIS — R2689 Other abnormalities of gait and mobility: Secondary | ICD-10-CM | POA: Diagnosis not present

## 2022-05-28 DIAGNOSIS — R2681 Unsteadiness on feet: Secondary | ICD-10-CM | POA: Diagnosis not present

## 2022-05-28 DIAGNOSIS — I69398 Other sequelae of cerebral infarction: Secondary | ICD-10-CM | POA: Diagnosis not present

## 2022-05-28 DIAGNOSIS — M6281 Muscle weakness (generalized): Secondary | ICD-10-CM | POA: Diagnosis not present

## 2022-05-30 DIAGNOSIS — M6281 Muscle weakness (generalized): Secondary | ICD-10-CM | POA: Diagnosis not present

## 2022-05-30 DIAGNOSIS — I69398 Other sequelae of cerebral infarction: Secondary | ICD-10-CM | POA: Diagnosis not present

## 2022-05-30 DIAGNOSIS — R278 Other lack of coordination: Secondary | ICD-10-CM | POA: Diagnosis not present

## 2022-05-31 DIAGNOSIS — R2689 Other abnormalities of gait and mobility: Secondary | ICD-10-CM | POA: Diagnosis not present

## 2022-05-31 DIAGNOSIS — M6281 Muscle weakness (generalized): Secondary | ICD-10-CM | POA: Diagnosis not present

## 2022-05-31 DIAGNOSIS — R2681 Unsteadiness on feet: Secondary | ICD-10-CM | POA: Diagnosis not present

## 2022-05-31 DIAGNOSIS — I69398 Other sequelae of cerebral infarction: Secondary | ICD-10-CM | POA: Diagnosis not present

## 2022-06-01 ENCOUNTER — Other Ambulatory Visit: Payer: Self-pay | Admitting: Physical Medicine and Rehabilitation

## 2022-06-04 DIAGNOSIS — I69398 Other sequelae of cerebral infarction: Secondary | ICD-10-CM | POA: Diagnosis not present

## 2022-06-04 DIAGNOSIS — R2681 Unsteadiness on feet: Secondary | ICD-10-CM | POA: Diagnosis not present

## 2022-06-04 DIAGNOSIS — R2689 Other abnormalities of gait and mobility: Secondary | ICD-10-CM | POA: Diagnosis not present

## 2022-06-04 DIAGNOSIS — M6281 Muscle weakness (generalized): Secondary | ICD-10-CM | POA: Diagnosis not present

## 2022-06-06 DIAGNOSIS — R2689 Other abnormalities of gait and mobility: Secondary | ICD-10-CM | POA: Diagnosis not present

## 2022-06-06 DIAGNOSIS — R2681 Unsteadiness on feet: Secondary | ICD-10-CM | POA: Diagnosis not present

## 2022-06-06 DIAGNOSIS — M6281 Muscle weakness (generalized): Secondary | ICD-10-CM | POA: Diagnosis not present

## 2022-06-06 DIAGNOSIS — I69398 Other sequelae of cerebral infarction: Secondary | ICD-10-CM | POA: Diagnosis not present

## 2022-06-11 ENCOUNTER — Encounter: Payer: Self-pay | Admitting: Physical Medicine and Rehabilitation

## 2022-06-11 ENCOUNTER — Encounter: Payer: Medicare PPO | Attending: Physical Medicine and Rehabilitation | Admitting: Physical Medicine and Rehabilitation

## 2022-06-11 VITALS — BP 127/73 | HR 71 | Ht 65.0 in

## 2022-06-11 DIAGNOSIS — K922 Gastrointestinal hemorrhage, unspecified: Secondary | ICD-10-CM

## 2022-06-11 DIAGNOSIS — Z7409 Other reduced mobility: Secondary | ICD-10-CM | POA: Diagnosis not present

## 2022-06-11 DIAGNOSIS — R519 Headache, unspecified: Secondary | ICD-10-CM

## 2022-06-11 NOTE — Progress Notes (Signed)
Subjective:    Patient ID: Kristin [name], female    DOB: [birth year], 82 y.o.   MRN: [id number]  HPI  Kristin [name] presents for follow-up of bilateral shoulder pain and right sided hemiplegia.  1) Dysphagia She has been having coughing and choking with water and pills -can drink her protein drinks -some shortness of breath with walking -no productive cough  2) Bilateral shoulder pain -Pain has been stable -Does not feel she needs repeat shoulder injection -worse with activity -she has completed OT at her facility. Does not feel she needs repeat OT.  -she continues to take Gabapentin 372m TID  2) Fall -no more falls since last visit -she has completed PT -She continues to have difficulty getting in and out of the bed and difficulty flexing the right hip.  -She continues to sleep in the recliner.   3) right sided spasticity -much improved -continues daily stretching regimen  4) left knee pain -limiting her mobility -she is currently not doing any therapy or using any medication for this.   5) Impaired mobility -she had COVID and mobility decreased after this. -was having a hard tim getting up and going to the bathroom.  -worsened recently after hospitalization -transfers more difficulty -no falls -still doing PT  6) Right sided pain -methocarbamol helps -massage helps  7) Has an appointment with Dr. MZigmund Danielfor her vision.   8) right sided weakness -she needs a new AFO as current one is very old -she needs a new OT referral for strengthening of her RUE  9) GI bleed -was there 2 or 3 days  10) Headache: -just today    Prior history: Overall she is doing much better  Shoulder pain improved with OT and injection.  I am meeting with the patient today for follow-up of left MCA infarct and associated deficits.  [name] and is not as aware of her daily activity at her facility.   Her left shoulder pain and range of motion are much improved  from corticosteroid injection. No need to repeat injection at this time.   Her pain in her right arm and leg is much improved since increasing her Gabapentin to 3037mTID. She continues on this dose. She does not need a refill today.  The patient demonstrated some of the stretches she is doing on her own. This does not make her drowsy.    She is falling asleep well at night but nocturia wakes her 3-4 times.   Urology has gone up up on DDAVP and this has helped  Podiatry removed foreign body in foot and pain has resolved.   Mood has been good.  She is not eating as well as usual since she has less communal dining events at her center. She is not involved in any physical therapy but tries to stretch out her hand herself. She is able to ambulate within her room and use the rest room. She has a lot of joy in her life in living in the center. She loves listening to music.   Kristin [name] has continued outpatient follow-up with urology and continues on DDAVP and Myrbetriq for urinary incontinence. Her sleep continues to be disturbed as a result of this.   Cervical myofascial pain: For now, continue to apply compounded cream up to 4 times per day. Has improved with OT  Pain Inventory Average Pain 2 Pain Right Now 4 My pain is Aching  LOCATION OF PAIN  shoulder, leg  BOWEL Number  of stools per week: normal Oral laxative use No  Type of laxative . Enema or suppository use No  History of colostomy No  Incontinent No   BLADDER Pads In and out cath, frequency . Able to self cath  . Bladder incontinence Yes  Frequent urination Yes  Leakage with coughing No  Difficulty starting stream No  Incomplete bladder emptying No    Mobility use a walker ability to climb steps?  no do you drive?  no  Function disabled: date disabled 04/1999 I need assistance with the following:  dressing, bathing, meal prep, household duties, and shopping  Neuro/Psych bladder control problems spasms  Prior  Studies Any changes since last visit?  no  Physicians involved in your care Any changes since last visit?  no   Family History  Problem Relation Age of Onset   Diabetes Mother    Heart disease Mother    Heart attack Father    Breast cancer Sister    Social History   Socioeconomic History   Marital status: Married    Spouse name: Not on file   Number of children: 2   Years of education: Not on file   Highest education level: Not on file  Occupational History   Occupation: retired Education officer, museum  Tobacco Use   Smoking status: Never   Smokeless tobacco: Never  Vaping Use   Vaping Use: Never used  Substance and Sexual Activity   Alcohol use: Yes    Comment: rarely   Drug use: No   Sexual activity: Never  Other Topics Concern   Not on file  Social History Narrative   Not on file   Social Determinants of Health   Financial Resource Strain: Not on file  Food Insecurity: No Food Insecurity (05/23/2022)   Hunger Vital Sign    Worried About Running Out of Food in the Last Year: Never true    [place] in the Last Year: Never true  Transportation Needs: No Transportation Needs (05/23/2022)   PRAPARE - Hydrologist (Medical): No    Lack of Transportation (Non-Medical): No  Physical Activity: Not on file  Stress: Not on file  Social Connections: Not on file   Past Surgical History:  Procedure Laterality Date   ABDOMINAL HYSTERECTOMY     CAROTID STENT     cataracts     COLONOSCOPY N/A 10/05/2015   Procedure: COLONOSCOPY;  Surgeon: Manus Gunning, MD;  Location: [facility];  Service: Gastroenterology;  Laterality: N/A;   CORONARY ARTERY BYPASS GRAFT N/A 12/17/2013   Procedure: CORONARY ARTERY BYPASS GRAFTING (CABG);  Surgeon: Gaye Pollack, MD;  Location: [facility];  Service: Open Heart Surgery;  Laterality: N/A;  Times 2 using left internal mammary artery and endoscopically harvested right saphenous vein   ESOPHAGOGASTRODUODENOSCOPY      ESOPHAGOGASTRODUODENOSCOPY N/A 10/05/2015   Procedure: ESOPHAGOGASTRODUODENOSCOPY (EGD);  Surgeon: Manus Gunning, MD;  Location: [facility];  Service: Gastroenterology;  Laterality: N/A;   INTRAOPERATIVE TRANSESOPHAGEAL ECHOCARDIOGRAM N/A 12/17/2013   Procedure: INTRAOPERATIVE TRANSESOPHAGEAL ECHOCARDIOGRAM;  Surgeon: Gaye Pollack, MD;  Location: [facility] OR;  Service: Open Heart Surgery;  Laterality: N/A;   lasix     LEFT HEART CATHETERIZATION WITH CORONARY ANGIOGRAM N/A 12/13/2013   Procedure: LEFT HEART CATHETERIZATION WITH CORONARY ANGIOGRAM;  Surgeon: Jettie Booze, MD;  Location: [facility] CATH LAB;  Service: Cardiovascular;  Laterality: N/A;   retina injection     Past Medical History:  Diagnosis Date  Arthritis    hands   CAD (coronary artery disease)    a. 2010: stents pRCA and dRCA. b. LHC 12/13/13: Minimal distal LM dz. LAD 95% ostial w/ L-->R collat. LCx mild dz. RCA 95% mid-dRCA. Patent stents proximal and distal RCA. EF not assessed, Nl by nuclear study.      Central retinal vein occlusion, right eye    Chronic kidney disease    hx kidney stone   Depression    Diplopia    Ear pain    right   Eosinophilic esophagitis    Essential hypertension    GERD (gastroesophageal reflux disease)    H/O: whooping cough    Hyperlipidemia with target LDL less than 70    Incontinence    urine   Stroke ([place]) 2001   reesultant Rt spastic hemplegia and aphasia    There were no vitals taken for this visit.  Opioid Risk Score:   Fall Risk Score:  `1  Depression screen [facility] 2/9     05/07/2022    8:24 AM 03/04/2022    1:04 PM 05/08/2021   11:10 AM 03/08/2021    1:05 PM 10/27/2020    2:15 PM 05/29/2020    2:24 PM 10/21/2019    9:05 AM  Depression screen [facility] 2/9  Decreased Interest 0 0 0 0 1 0 0  Down, Depressed, Hopeless 0 0 0 0 1 0 0  PHQ - 2 Score 0 0 0 0 2 0 0      Family History  Problem Relation Age of Onset   Diabetes Mother    Heart disease Mother    Heart attack  Father    Breast cancer Sister    Social History   Socioeconomic History   Marital status: Married    Spouse name: Not on file   Number of children: 2   Years of education: Not on file   Highest education level: Not on file  Occupational History   Occupation: retired Education officer, museum  Tobacco Use   Smoking status: Never   Smokeless tobacco: Never  Vaping Use   Vaping Use: Never used  Substance and Sexual Activity   Alcohol use: Yes    Comment: rarely   Drug use: No   Sexual activity: Never  Other Topics Concern   Not on file  Social History Narrative   Not on file   Social Determinants of Health   Financial Resource Strain: Not on file  Food Insecurity: No Food Insecurity (05/23/2022)   Hunger Vital Sign    Worried About Running Out of Food in the Last Year: Never true    Ran Out of Food in the Last Year: Never true  Transportation Needs: No Transportation Needs (05/23/2022)   PRAPARE - Hydrologist (Medical): No    Lack of Transportation (Non-Medical): No  Physical Activity: Not on file  Stress: Not on file  Social Connections: Not on file   Past Surgical History:  Procedure Laterality Date   ABDOMINAL HYSTERECTOMY     CAROTID STENT     cataracts     COLONOSCOPY N/A 10/05/2015   Procedure: COLONOSCOPY;  Surgeon: Manus Gunning, MD;  Location: [facility];  Service: Gastroenterology;  Laterality: N/A;   CORONARY ARTERY BYPASS GRAFT N/A 12/17/2013   Procedure: CORONARY ARTERY BYPASS GRAFTING (CABG);  Surgeon: Gaye Pollack, MD;  Location: [facility];  Service: Open Heart Surgery;  Laterality: N/A;  Times 2 using left internal mammary artery  and endoscopically harvested right saphenous vein   ESOPHAGOGASTRODUODENOSCOPY     ESOPHAGOGASTRODUODENOSCOPY N/A 10/05/2015   Procedure: ESOPHAGOGASTRODUODENOSCOPY (EGD);  Surgeon: Manus Gunning, MD;  Location: [facility];  Service: Gastroenterology;  Laterality: N/A;   INTRAOPERATIVE  TRANSESOPHAGEAL ECHOCARDIOGRAM N/A 12/17/2013   Procedure: INTRAOPERATIVE TRANSESOPHAGEAL ECHOCARDIOGRAM;  Surgeon: Gaye Pollack, MD;  Location[facility] OR;  Service: Open Heart Surgery;  Laterality: N/A;   lasix     LEFT HEART CATHETERIZATION WITH CORONARY ANGIOGRAM N/A 12/13/2013   Procedure: LEFT HEART CATHETERIZATION WITH CORONARY ANGIOGRAM;  Surgeon: Jettie Booze, MD;  Location: [facility] CATH LAB;  Service: Cardiovascular;  Laterality: N/A;   retina injection     Past Medical History:  Diagnosis Date   Arthritis    hands   CAD (coronary artery disease)    a. 2010: stents pRCA and dRCA. b. LHC 12/13/13: Minimal distal LM dz. LAD 95% ostial w/ L-->R collat. LCx mild dz. RCA 95% mid-dRCA. Patent stents proximal and distal RCA. EF not assessed, Nl by nuclear study.      Central retinal vein occlusion, right eye    Chronic kidney disease    hx kidney stone   Depression    Diplopia    Ear pain    right   Eosinophilic esophagitis    Essential hypertension    GERD (gastroesophageal reflux disease)    H/O: whooping cough    Hyperlipidemia with target LDL less than 70    Incontinence    urine   Stroke ([place]) 2001   reesultant Rt spastic hemplegia and aphasia    There were no vitals taken for this visit.  Opioid Risk Score:   Fall Risk Score:  `1  Depression screen [facility] 2/9     05/07/2022    8:24 AM 03/04/2022    1:04 PM 05/08/2021   11:10 AM 03/08/2021    1:05 PM 10/27/2020    2:15 PM 05/29/2020    2:24 PM 10/21/2019    9:05 AM  Depression screen PHQ 2/9  Decreased Interest 0 0 0 0 1 0 0  Down, Depressed, Hopeless 0 0 0 0 1 0 0  PHQ - 2 Score 0 0 0 0 2 0 0   Review of Systems  Gastrointestinal: Negative.   Musculoskeletal:  Positive for gait problem.       Pain in right leg, right shoulder, & right knee  Neurological:  Positive for speech difficulty.  All other systems reviewed and are negative.     Objective:   Physical Exam Gen: no distress, normal appearing HEENT: oral  mucosa pink and moist, NCAT Cardio: Reg rate Chest: normal effort, normal rate of breathing Abd: soft, non-distended Ext: no edema Psych: pleasant, normal affect Skin: intact Neuro: Patient communicates through short phrases and words as well as head nods.  Primarily expressive aphasia; she understands very well and follows commands well.  Right upper extremity with minimal voluntary movement.  She has 2+ to 3 out of 5 hip flexor and knee extensor strength right lower extremity.  Right ankle in splint with minimal active movement.  Left upper extremity and left lower extremity grossly 4+ to 5 out of 5. No tenderness over left subacromial joint. Cervical myofascial trigger points have improved.  Musculoskeletal: ongoing sublux.  Minimal pain with right knee range of motion today, passive or active.. Right hamstrings tight.  Psych: Pt's affect is pleasant Assessment & Plan:  1. Hx of left MCA infarct with spastic right hemiparesis, expressive greater than  receptive aphasia. Continue PT and OT.  2. Primary osteoarthritis of bilateral knees 4. Insomnia, anxiety  5. Constipation 6. Bilateral shoulder pain related to chronic subluxation, mild adhesive capsulitis, chronic spasticity  7. OSA.  8. Left rotator cuff tendonitis/bursitis;biciptial tendonitis --generally improved.  9. Right rotator cuff tendonitis/right shoulder sublux 10.  Right sided neuropathic pain 11. NON-healing left medial ankle wound.  12. S/p fall 13. Right sided spasticity 14. Dysphagia     Plan:  1.Continue daily ambulation and exercise at facility.  Focus should be on mechanics and strengthening of the lower extremities, particularly the right side.  Also needs to improve hamstring flexibility. Prescribed occupational therapy to maximize range of motion of left shoulder. Added physical therapy for strengthening of lower extremities.  2. Continue Gabapentin dose 363m TID for pain control. Does not need refill today.   3.  Can stop right shoulder harness which is providing minimal benefit.  4. Corticosteroid injection appears to have improved pain as well as range of motion. Trigger points improved. Discussed applying the compounded cream there up to 4 times per day. No need to repeat at this time. Daughter will call me if repeated needed. 5. Left foot swelling resolved after foreign body removal.  6. Recommended blue emu oil. Continue compounded gel.  7. Discussed benefits of Tai Chi and [employer] Your [employer] Program- will let he know when running 8. No more falls.  9. Spasticity is very well controlled, continue stretching regimen.  10. Apply blue emu oil to knees and shoulders as needed 11. Prescribed OT for right sided upper extremity strengthening 12. Provided her script to get new right sided AFO from [facility] 13. Provided SLP script for dysphagia eval and treatment  All questions answered. RTC in 6 months.

## 2022-06-18 DIAGNOSIS — R2689 Other abnormalities of gait and mobility: Secondary | ICD-10-CM | POA: Diagnosis not present

## 2022-06-18 DIAGNOSIS — M6281 Muscle weakness (generalized): Secondary | ICD-10-CM | POA: Diagnosis not present

## 2022-06-18 DIAGNOSIS — R2681 Unsteadiness on feet: Secondary | ICD-10-CM | POA: Diagnosis not present

## 2022-06-18 DIAGNOSIS — I69398 Other sequelae of cerebral infarction: Secondary | ICD-10-CM | POA: Diagnosis not present

## 2022-06-20 DIAGNOSIS — M6281 Muscle weakness (generalized): Secondary | ICD-10-CM | POA: Diagnosis not present

## 2022-06-20 DIAGNOSIS — I69398 Other sequelae of cerebral infarction: Secondary | ICD-10-CM | POA: Diagnosis not present

## 2022-06-20 DIAGNOSIS — R2689 Other abnormalities of gait and mobility: Secondary | ICD-10-CM | POA: Diagnosis not present

## 2022-06-20 DIAGNOSIS — R2681 Unsteadiness on feet: Secondary | ICD-10-CM | POA: Diagnosis not present

## 2022-06-26 DIAGNOSIS — I69398 Other sequelae of cerebral infarction: Secondary | ICD-10-CM | POA: Diagnosis not present

## 2022-06-26 DIAGNOSIS — R2681 Unsteadiness on feet: Secondary | ICD-10-CM | POA: Diagnosis not present

## 2022-06-26 DIAGNOSIS — M6281 Muscle weakness (generalized): Secondary | ICD-10-CM | POA: Diagnosis not present

## 2022-06-26 DIAGNOSIS — R2689 Other abnormalities of gait and mobility: Secondary | ICD-10-CM | POA: Diagnosis not present

## 2022-07-17 ENCOUNTER — Ambulatory Visit: Payer: Medicare PPO | Admitting: Podiatry

## 2022-07-17 ENCOUNTER — Ambulatory Visit: Payer: Medicare PPO | Admitting: Podiatrist

## 2022-07-17 DIAGNOSIS — G5791 Unspecified mononeuropathy of right lower limb: Secondary | ICD-10-CM

## 2022-07-17 DIAGNOSIS — M79675 Pain in left toe(s): Secondary | ICD-10-CM

## 2022-07-17 DIAGNOSIS — M79674 Pain in right toe(s): Secondary | ICD-10-CM | POA: Diagnosis not present

## 2022-07-17 DIAGNOSIS — B351 Tinea unguium: Secondary | ICD-10-CM | POA: Diagnosis not present

## 2022-07-17 NOTE — Progress Notes (Signed)
  Subjective:  Patient ID: Kristin [name], female    DOB: 1940/08/12,  MRN: [id number]  Kristin [name] presents to clinic today for at risk foot care with history of peripheral neuropathy and corn(s) b/l lower extremities and painful thick toenails that are difficult to trim. Painful toenails interfere with ambulation. Aggravating factors include wearing enclosed shoe gear. Pain is relieved with periodic professional debridement. Painful corns are aggravated when weightbearing when wearing enclosed shoe gear. Pain is relieved with periodic professional debridement.   She is accompanied by her daughter on today's visit.  Chief Complaint  Patient presents with   Nail Problem    [facility] PCP-[place] PCP VST-Early 2024   New problem(s): None.   PCP is Lauree Chandler, NP.  No Known Allergies  Review of Systems: Negative except as noted in the HPI.  Objective: No changes noted in today's physical examination. There were no vitals filed for this visit.  Kristin [name] is a pleasant 82 y.o. female WD, WN in NAD. AAO x 3. Vascular CFT immediate b/l LE. Palpable DP/PT pulses b/l LE. Digital hair absent b/l. Skin temperature gradient WNL b/l. No pain with calf compression b/l. No edema noted b/l. No cyanosis or clubbing noted b/l LE.  Neurologic Oriented to person, place, and time. Protective sensation intact with 10 gram monofilament left lower extremity. Protective sensation decreased with 10 gram monofilament right lower extremity.  Dermatologic Pedal integument with normal turgor, texture and tone b/l LE. No open wounds b/l. No interdigital macerations b/l. Toenails 1-5 b/l elongated, thickened, discolored with subungual debris. +Tenderness with dorsal palpation of nailplates. Hyperkeratotic lesion(s) distal tip of 2nd toe left foot.  No erythema, no edema, no drainage, no fluctuance.  Orthopedic: Dropfoot right lower extremity. AFO RLE. Muscle strength 5/5 to all LE muscle groups of left lower extremity. No  pain, crepitus or joint limitation noted with ROM bilateral LE. Clawtoe deformity 1-5 right.   Radiographs: None  Assessment/Plan:   ICD-10-CM   1. Neuropathy of right lower extremity  G57.91     2. Pain due to onychomycosis of toenails of both feet  B35.1    M79.675    M79.674        -Patient's family member present. All questions/concerns addressed on today's visit. -Toenails 1-5 bilaterally were debrided in length and girth with sterile nail nippers and dremel.   - hyperkeratotic lesion debrided as a courtesy x 1 -Continue soft, supportive shoe gear daily -Report any pedal problems to medical professional immediately. -Patient/POA to contact office if there are any problems in the interim.

## 2022-08-12 ENCOUNTER — Telehealth: Payer: Self-pay | Admitting: Internal Medicine

## 2022-08-12 MED ORDER — HYDROCORTISONE (PERIANAL) 2.5 % EX CREA: 1.0000 | TOPICAL_CREAM | CUTANEOUS | 3 refills | Status: DC | PRN

## 2022-08-12 NOTE — Telephone Encounter (Signed)
Chronic hemorrhoids no new problems Requesting refill on HC cream

## 2022-08-28 ENCOUNTER — Encounter: Payer: Self-pay | Admitting: Nurse Practitioner

## 2022-09-02 ENCOUNTER — Encounter: Payer: Self-pay | Admitting: Nurse Practitioner

## 2022-09-02 ENCOUNTER — Ambulatory Visit: Payer: Medicare PPO | Admitting: Nurse Practitioner

## 2022-09-02 ENCOUNTER — Other Ambulatory Visit: Payer: Self-pay | Admitting: Nurse Practitioner

## 2022-09-02 VITALS — BP 130/82 | HR 75 | Temp 97.6°F

## 2022-09-02 DIAGNOSIS — K219 Gastro-esophageal reflux disease without esophagitis: Secondary | ICD-10-CM

## 2022-09-02 DIAGNOSIS — M792 Neuralgia and neuritis, unspecified: Secondary | ICD-10-CM | POA: Diagnosis not present

## 2022-09-02 DIAGNOSIS — N3281 Overactive bladder: Secondary | ICD-10-CM

## 2022-09-02 DIAGNOSIS — N390 Urinary tract infection, site not specified: Secondary | ICD-10-CM | POA: Diagnosis not present

## 2022-09-02 DIAGNOSIS — E785 Hyperlipidemia, unspecified: Secondary | ICD-10-CM

## 2022-09-02 DIAGNOSIS — G8110 Spastic hemiplegia affecting unspecified side: Secondary | ICD-10-CM

## 2022-09-02 DIAGNOSIS — I10 Essential (primary) hypertension: Secondary | ICD-10-CM

## 2022-09-02 DIAGNOSIS — I69351 Hemiplegia and hemiparesis following cerebral infarction affecting right dominant side: Secondary | ICD-10-CM | POA: Diagnosis not present

## 2022-09-02 DIAGNOSIS — I69320 Aphasia following cerebral infarction: Secondary | ICD-10-CM | POA: Diagnosis not present

## 2022-09-02 DIAGNOSIS — D509 Iron deficiency anemia, unspecified: Secondary | ICD-10-CM | POA: Diagnosis not present

## 2022-09-02 NOTE — Progress Notes (Signed)
Careteam: Patient Care Team: Sharon Seller, NP as PCP - General (Geriatric Medicine) Armbruster, Willaim Rayas, MD as Consulting Physician (Gastroenterology) Ranelle Oyster, MD as Consulting Physician (Physical Medicine and Rehabilitation) Register, Joice Lofts, PA-C as Physician Assistant (Dermatology) Freddie Breech, DPM as Consulting Physician (Podiatry) Sherrie George, MD as Consulting Physician (Ophthalmology) Jamison Neighbor, MD (Urology)  PLACE OF SERVICE:  [facility]  Advanced Directive information Does Patient Have a Medical Advance Directive?: Yes, Type of Advance Directive: Out of facility DNR (pink MOST or yellow form), Pre-existing out of facility DNR order (yellow form or pink MOST form): Pink MOST form placed in chart (order not valid for inpatient use), Does patient want to make changes to medical advance directive?: No - Patient declined  No Known Allergies  Chief Complaint  Patient presents with   Medical Management of Chronic Issues    6 month follow-up      HPI: Patient is a 82 y.o. female for routine follow up.  Pt was hospitalized for acute GI bleeding in February.  She has not noticed any dark stools.  Not on any iron supplements at this time.  Hgb was normal on last lab from 05/24/22  More difficulty swallowing with medication. She has had ST and OT in the past for that.   Ongoing urinary issues seeing urologist. She is on myrbetriq   Continues on omeprazole for GERD- controlled.   She has pain in her legs. She is on tylenol which helps.   Has lost a lot of muscle ton recently, transfers are more challenging. More weakness over time. Hard time getting in and out of car.  She lives in independent living. [place] and has a lot of support there.  They have services as needed.  She sleeps in the recliner because it is hard to get in and out of the bed  Uses a walker to get around apartment.  Has pendant if she falls.  Review of Systems:   Review of Systems  Constitutional:  Negative for chills, fever and weight loss.  HENT:  Negative for hearing loss and tinnitus.   Respiratory:  Negative for cough, sputum production and shortness of breath.   Cardiovascular:  Negative for chest pain, palpitations and leg swelling.  Gastrointestinal:  Negative for abdominal pain, constipation, diarrhea and heartburn.  Genitourinary:  Negative for dysuria, frequency and urgency.       Overactive bladder  Musculoskeletal:  Positive for joint pain and myalgias. Negative for back pain and falls.  Skin: Negative.   Neurological:  Negative for dizziness and headaches.       Right sided weakness after CVA  Psychiatric/Behavioral:  Negative for depression and memory loss. The patient does not have insomnia.     Past Medical History:  Diagnosis Date   Arthritis    hands   CAD (coronary artery disease)    a. 2010: stents pRCA and dRCA. b. LHC 12/13/13: Minimal distal LM dz. LAD 95% ostial w/ L-->R collat. LCx mild dz. RCA 95% mid-dRCA. Patent stents proximal and distal RCA. EF not assessed, Nl by nuclear study.      Central retinal vein occlusion, right eye    Chronic kidney disease    hx kidney stone   Depression    Diplopia    Ear pain    right   Eosinophilic esophagitis    Essential hypertension    GERD (gastroesophageal reflux disease)    H/O: whooping cough    Hyperlipidemia  with target LDL less than 70    Incontinence    urine   Stroke (HCC) 2001   reesultant Rt spastic hemplegia and aphasia    Past Surgical History:  Procedure Laterality Date   ABDOMINAL HYSTERECTOMY     CAROTID STENT     cataracts     COLONOSCOPY N/A 10/05/2015   Procedure: COLONOSCOPY;  Surgeon: Ruffin Frederick, MD;  Location: [facility];  Service: Gastroenterology;  Laterality: N/A;   CORONARY ARTERY BYPASS GRAFT N/A 12/17/2013   Procedure: CORONARY ARTERY BYPASS GRAFTING (CABG);  Surgeon: Alleen Borne, MD;  Location[facility] OR;  Service: Open Heart  Surgery;  Laterality: N/A;  Times 2 using left internal mammary artery and endoscopically harvested right saphenous vein   ESOPHAGOGASTRODUODENOSCOPY     ESOPHAGOGASTRODUODENOSCOPY N/A 10/05/2015   Procedure: ESOPHAGOGASTRODUODENOSCOPY (EGD);  Surgeon: Ruffin Frederick, MD;  Location: [facility];  Service: Gastroenterology;  Laterality: N/A;   INTRAOPERATIVE TRANSESOPHAGEAL ECHOCARDIOGRAM N/A 12/17/2013   Procedure: INTRAOPERATIVE TRANSESOPHAGEAL ECHOCARDIOGRAM;  Surgeon: Alleen Borne, MD;  Location: [facility] OR;  Service: Open Heart Surgery;  Laterality: N/A;   lasix     LEFT HEART CATHETERIZATION WITH CORONARY ANGIOGRAM N/A 12/13/2013   Procedure: LEFT HEART CATHETERIZATION WITH CORONARY ANGIOGRAM;  Surgeon: Corky Crafts, MD;  Location: [facility] CATH LAB;  Service: Cardiovascular;  Laterality: N/A;   retina injection     Social History:   reports that she has never smoked. She has never used smokeless tobacco. She reports current alcohol use. She reports that she does not use drugs.  Family History  Problem Relation Age of Onset   Diabetes Mother    Heart disease Mother    Heart attack Father    Breast cancer Sister     Medications: Patient's Medications  New Prescriptions   No medications on file  Previous Medications   ACETAMINOPHEN (TYLENOL) 500 MG TABLET    Take 1,000 mg by mouth in the morning, at noon, and at bedtime.   ASPIRIN 81 MG TABLET    Take 81 mg by mouth daily.   ATORVASTATIN (LIPITOR) 40 MG TABLET    tAKE ONE TABLET BY MOUTH DAILY.   CHOLECALCIFEROL (VITAMIN D3) 50 MCG (2000 UT) CAPSULE    Take 2,000 Units by mouth at bedtime.   CLOPIDOGREL (PLAVIX) 75 MG TABLET    TAKE ONE TABLET BY MOUTH DAILY   COENZYME Q10 (COQ-10 PO)    Take 1 tablet daily by mouth.   DESMOPRESSIN (DDAVP) 0.2 MG TABLET    Take 0.4 mg by mouth at bedtime.   DICLOFENAC SODIUM (VOLTAREN) 1 % GEL    APPLY TO THE AFFECTED AREA(S) THREE TIMES DAILY.   DOCUSATE SODIUM (COLACE) 100 MG CAPSULE     Take 100 mg by mouth daily.   DULOXETINE (CYMBALTA) 20 MG CAPSULE    TAKE ONE CAPSULE BY MOUTH DAILY   ESTRADIOL (ESTRACE) 0.1 MG/GM VAGINAL CREAM    Place 1 Applicatorful vaginally 3 (three) times a week.   GABAPENTIN (NEURONTIN) 300 MG CAPSULE    TAKE ONE CAPSULE BY MOUTH THREE TIMES DAILY   HYDROCORTISONE (ANUSOL-HC) 2.5 % RECTAL CREAM    Place 1 Application rectally as needed for hemorrhoids.   METHOCARBAMOL (ROBAXIN) 500 MG TABLET    Take 1 tablet (500 mg total) by mouth every 8 (eight) hours as needed for muscle spasms.   MIRABEGRON ER (MYRBETRIQ) 50 MG TB24 TABLET    Take 50 mg by mouth daily.   NITROGLYCERIN (NITROSTAT) 0.4  MG SL TABLET    Place 1 tablet (0.4 mg total) under the tongue every 5 (five) minutes as needed for chest pain.   OMEPRAZOLE (PRILOSEC) 20 MG CAPSULE    TAKE ONE CAPSULE BY MOUTH DAILY   POLYETHYLENE GLYCOL (MIRALAX / GLYCOLAX) 17 G PACKET    Take 17 g by mouth daily.   TRIMETHOPRIM (TRIMPEX) 100 MG TABLET    Take 100 mg by mouth at bedtime.   TURMERIC PO    Take 1 tablet by mouth daily.   Modified Medications   No medications on file  Discontinued Medications   No medications on file    Physical Exam:  Vitals:   09/02/22 1126  BP: 130/82  Pulse: 75  Temp: 97.6 F (36.4 C)  TempSrc: Temporal  SpO2: 95%   There is no height or weight on file to calculate BMI. Wt Readings from Last 3 Encounters:  05/24/22 186 lb 4.8 oz (84.5 kg)  03/02/21 191 lb (86.6 kg)  02/23/21 170 lb (77.1 kg)    Physical Exam Constitutional:      General: She is not in acute distress.    Appearance: She is well-developed. She is not diaphoretic.  HENT:     Head: Normocephalic and atraumatic.     Mouth/Throat:     Pharynx: No oropharyngeal exudate.  Eyes:     Conjunctiva/sclera: Conjunctivae normal.     Pupils: Pupils are equal, round, and reactive to light.  Cardiovascular:     Rate and Rhythm: Normal rate and regular rhythm.     Heart sounds: Normal heart sounds.   Pulmonary:     Effort: Pulmonary effort is normal.     Breath sounds: Normal breath sounds.  Abdominal:     General: Bowel sounds are normal.     Palpations: Abdomen is soft.  Musculoskeletal:     Cervical back: Normal range of motion and neck supple.     Right lower leg: No edema.     Left lower leg: No edema.  Skin:    General: Skin is warm and dry.  Neurological:     Mental Status: She is alert.     Motor: Weakness present.     Gait: Gait abnormal.     Comments: Right sided weakness   Psychiatric:        Mood and Affect: Mood normal.     Labs reviewed: Basic Metabolic Panel: Recent Labs    05/22/22 2030 05/23/22 0305 05/24/22 0340  NA 138 138 137  K 4.1 3.6 3.8  CL 101 106 106  CO2 27 25 25   GLUCOSE 112* 118* 120*  BUN 26* 26* 9  CREATININE 0.92 0.60 0.49  CALCIUM 9.2 8.4* 8.5*  MG  --   --  1.7  PHOS  --   --  3.6   Liver Function Tests: Recent Labs    03/04/22 1339 05/22/22 2039  AST 13 19  ALT 10 12  ALKPHOS  --  70  BILITOT 0.6 0.7  PROT 6.8 7.4  ALBUMIN  --  4.1   No results for input(s): "LIPASE", "AMYLASE" in the last 8760 hours. No results for input(s): "AMMONIA" in the last 8760 hours. CBC: Recent Labs    03/04/22 1339 05/22/22 2030 05/23/22 0020 05/23/22 0305 05/23/22 0639 05/24/22 0340  WBC 5.1 6.5 5.0  --   --  6.4  NEUTROABS 3,713 4.5  --   --   --  5.0  HGB 14.7 13.8 8.8* 11.6* 11.9* 12.8  HCT 44.0 43.6 28.7* 36.9 37.7 39.5  MCV 89.4 93.2 95.3  --   --  90.2  PLT 251 256 167  --   --  221   Lipid Panel: Recent Labs    03/04/22 1339  CHOL 146  HDL 51  LDLCALC 77  TRIG 101  CHOLHDL 2.9   TSH: No results for input(s): "TSH" in the last 8760 hours. A1C: Lab Results  Component Value Date   HGBA1C 5.4 06/02/2018     Assessment/Plan 1. Essential hypertension -Blood pressure well controlled, goal bp <140/90 Continue current medications and dietary modifications follow metabolic panel - CBC with  Differential/Platelet - Complete Metabolic Panel with eGFR  2. Spastic hemiplegia of right dominant side as late effect of cerebral infarction (HCC) Stable, continues to work with therapy for gait and strength training.   3. OAB (overactive bladder) -ongoing, continues on myrbetriq, followed by urologist  4. Iron deficiency anemia, unspecified iron deficiency anemia type -will follow up labs today.  - CBC with Differential/Platelet  5. Dyslipidemia, goal LDL below 70 -continues on lipitor with dietary modifications  6. Recurrent UTI Stable, continues on daily trimethoprim. Followed by urologist  7. Gastroesophageal reflux disease without esophagitis Stable on omeprazole.   8. Aphasia as late effect of stroke -stable, continues on plavix and AsA  9. Neuropathic pain Controlled on gabapentin and cymbalta.    Return in about 6 months (around 03/05/2023) for routine follow up.  Janene Harvey. Biagio Borg [facility] [phone]

## 2022-09-03 LAB — COMPLETE METABOLIC PANEL WITH GFR
AG Ratio: 1.6 (calc) (ref 1.0–2.5)
ALT: 9 U/L (ref 6–29)
AST: 15 U/L (ref 10–35)
Albumin: 4.1 g/dL (ref 3.6–5.1)
Alkaline phosphatase (APISO): 81 U/L (ref 37–153)
BUN/Creatinine Ratio: 32 (calc) — ABNORMAL HIGH (ref 6–22)
BUN: 18 mg/dL (ref 7–25)
CO2: 28 mmol/L (ref 20–32)
Calcium: 9.2 mg/dL (ref 8.6–10.4)
Chloride: 103 mmol/L (ref 98–110)
Creat: 0.57 mg/dL — ABNORMAL LOW (ref 0.60–0.95)
Globulin: 2.5 g/dL (calc) (ref 1.9–3.7)
Glucose, Bld: 115 mg/dL — ABNORMAL HIGH (ref 65–99)
Potassium: 4 mmol/L (ref 3.5–5.3)
Sodium: 138 mmol/L (ref 135–146)
Total Bilirubin: 0.5 mg/dL (ref 0.2–1.2)
Total Protein: 6.6 g/dL (ref 6.1–8.1)
eGFR: 90 mL/min/{1.73_m2} (ref >=60)

## 2022-09-03 LAB — CBC WITH DIFFERENTIAL/PLATELET
Absolute Monocytes: 502 cells/uL (ref 200–950)
Basophils Absolute: 38 cells/uL (ref 0–200)
Basophils Relative: 0.7 %
Eosinophils Absolute: 265 cells/uL (ref 15–500)
Eosinophils Relative: 4.9 %
HCT: 40.7 % (ref 35.0–45.0)
Hemoglobin: 12.4 g/dL (ref 11.7–15.5)
Lymphs Abs: 929 cells/uL (ref 850–3900)
MCH: 25.1 pg — ABNORMAL LOW (ref 27.0–33.0)
MCHC: 30.5 g/dL — ABNORMAL LOW (ref 32.0–36.0)
MCV: 82.4 fL (ref 80.0–100.0)
MPV: 9.9 fL (ref 7.5–12.5)
Monocytes Relative: 9.3 %
Neutro Abs: 3667 cells/uL (ref 1500–7800)
Neutrophils Relative %: 67.9 %
Platelets: 291 10*3/uL (ref 140–400)
RBC: 4.94 10*6/uL (ref 3.80–5.10)
RDW: 12.8 % (ref 11.0–15.0)
Total Lymphocyte: 17.2 %
WBC: 5.4 10*3/uL (ref 3.8–10.8)

## 2022-09-04 ENCOUNTER — Encounter (INDEPENDENT_AMBULATORY_CARE_PROVIDER_SITE_OTHER): Payer: Medicare PPO | Admitting: Ophthalmology

## 2022-09-04 DIAGNOSIS — I10 Essential (primary) hypertension: Secondary | ICD-10-CM | POA: Diagnosis not present

## 2022-09-04 DIAGNOSIS — H34811 Central retinal vein occlusion, right eye, with macular edema: Secondary | ICD-10-CM | POA: Diagnosis not present

## 2022-09-04 DIAGNOSIS — H43813 Vitreous degeneration, bilateral: Secondary | ICD-10-CM | POA: Diagnosis not present

## 2022-09-04 DIAGNOSIS — H35033 Hypertensive retinopathy, bilateral: Secondary | ICD-10-CM | POA: Diagnosis not present

## 2022-09-27 DIAGNOSIS — X32XXXA Exposure to sunlight, initial encounter: Secondary | ICD-10-CM | POA: Diagnosis not present

## 2022-09-27 DIAGNOSIS — L570 Actinic keratosis: Secondary | ICD-10-CM | POA: Diagnosis not present

## 2022-10-15 ENCOUNTER — Encounter: Payer: Self-pay | Admitting: Podiatry

## 2022-10-15 ENCOUNTER — Ambulatory Visit: Payer: Medicare PPO | Admitting: Podiatry

## 2022-10-15 VITALS — BP 137/77 | HR 73

## 2022-10-15 DIAGNOSIS — G5791 Unspecified mononeuropathy of right lower limb: Secondary | ICD-10-CM | POA: Diagnosis not present

## 2022-10-15 DIAGNOSIS — B351 Tinea unguium: Secondary | ICD-10-CM | POA: Diagnosis not present

## 2022-10-15 DIAGNOSIS — M79674 Pain in right toe(s): Secondary | ICD-10-CM

## 2022-10-15 DIAGNOSIS — M79675 Pain in left toe(s): Secondary | ICD-10-CM | POA: Diagnosis not present

## 2022-10-20 NOTE — Progress Notes (Signed)
  Subjective:  Patient ID: Kristin [name], female    DOB: 08-17-1940,  MRN: [id number]  Kristin [name] presents to clinic today for: at risk foot care with history of peripheral neuropathy and painful elongated mycotic toenails 1-5 bilaterally which are tender when wearing enclosed shoe gear. Pain is relieved with periodic professional debridement.  Chief Complaint  Patient presents with   Nail Problem    "A three month foot check and nail trim"    PCP is Sharon Seller, NP.  No Known Allergies  Review of Systems: Negative except as noted in the HPI.  Objective: No changes noted in today's physical examination. Vitals:   10/15/22 1430  BP: 137/77  Pulse: 73    Kristin [name] is a pleasant 82 y.o. female in NAD. AAO x 3.  Vascular Examination: Capillary refill time <3 seconds b/l LE. Palpable pedal pulses b/l LE. Digital hair present b/l. No pedal edema b/l. Skin temperature gradient WNL b/l. No varicosities b/l. No cyanosis or clubbing noted b/l LE.Marland Kitchen  Dermatological Examination: Pedal skin with normal turgor, texture and tone b/l. No open wounds. No interdigital macerations b/l. Toenails 1-5 b/l thickened, discolored, dystrophic with subungual debris. There is pain on palpation to dorsal aspect of nailplates.   Neurological Examination: Protective sensation intact with 10 gram monofilament LLE; decreased RLE.  Musculoskeletal Examination: Dropfoot right lower extremity. Wearing AFO on right lower extremity. Clawtoe deformity 1-5 right foot.  Assessment/Plan: 1. Pain due to onychomycosis of toenails of both feet   2. Neuropathy of right lower extremity   -Patient was evaluated and treated. All patient's and/or POA's questions/concerns answered on today's visit. -Continue supportive shoe gear daily. -Mycotic toenails 1-5 bilaterally were debrided in length and girth with sterile nail nippers and dremel without incident. -Patient/POA to call should there be question/concern in the  interim.   Return in about 3 months (around 01/15/2023).  Freddie Breech, DPM

## 2022-12-04 ENCOUNTER — Other Ambulatory Visit: Payer: Self-pay | Admitting: Nurse Practitioner

## 2022-12-04 ENCOUNTER — Other Ambulatory Visit: Payer: Self-pay | Admitting: Physical Medicine and Rehabilitation

## 2022-12-04 DIAGNOSIS — K2100 Gastro-esophageal reflux disease with esophagitis, without bleeding: Secondary | ICD-10-CM

## 2022-12-04 DIAGNOSIS — M79604 Pain in right leg: Secondary | ICD-10-CM

## 2022-12-04 DIAGNOSIS — M792 Neuralgia and neuritis, unspecified: Secondary | ICD-10-CM

## 2022-12-04 NOTE — Telephone Encounter (Signed)
Patient medication has High Risk Warnings. Medication pend and sent to PCP Janyth Contes Janene Harvey, NP

## 2023-01-01 ENCOUNTER — Encounter (INDEPENDENT_AMBULATORY_CARE_PROVIDER_SITE_OTHER): Payer: Medicare PPO | Admitting: Ophthalmology

## 2023-01-01 DIAGNOSIS — H34811 Central retinal vein occlusion, right eye, with macular edema: Secondary | ICD-10-CM

## 2023-01-01 DIAGNOSIS — H35033 Hypertensive retinopathy, bilateral: Secondary | ICD-10-CM

## 2023-01-01 DIAGNOSIS — I10 Essential (primary) hypertension: Secondary | ICD-10-CM | POA: Diagnosis not present

## 2023-01-01 DIAGNOSIS — H43813 Vitreous degeneration, bilateral: Secondary | ICD-10-CM

## 2023-01-21 ENCOUNTER — Encounter: Payer: Self-pay | Admitting: Podiatry

## 2023-01-21 ENCOUNTER — Ambulatory Visit: Payer: Medicare PPO | Admitting: Podiatry

## 2023-01-21 DIAGNOSIS — M79675 Pain in left toe(s): Secondary | ICD-10-CM

## 2023-01-21 DIAGNOSIS — B351 Tinea unguium: Secondary | ICD-10-CM | POA: Diagnosis not present

## 2023-01-21 DIAGNOSIS — L84 Corns and callosities: Secondary | ICD-10-CM | POA: Diagnosis not present

## 2023-01-21 DIAGNOSIS — Z87440 Personal history of urinary (tract) infections: Secondary | ICD-10-CM | POA: Diagnosis not present

## 2023-01-21 DIAGNOSIS — G5791 Unspecified mononeuropathy of right lower limb: Secondary | ICD-10-CM | POA: Diagnosis not present

## 2023-01-21 DIAGNOSIS — N3946 Mixed incontinence: Secondary | ICD-10-CM | POA: Diagnosis not present

## 2023-01-21 DIAGNOSIS — Z87442 Personal history of urinary calculi: Secondary | ICD-10-CM | POA: Diagnosis not present

## 2023-01-21 DIAGNOSIS — R3581 Nocturnal polyuria: Secondary | ICD-10-CM | POA: Diagnosis not present

## 2023-01-21 DIAGNOSIS — M79674 Pain in right toe(s): Secondary | ICD-10-CM | POA: Diagnosis not present

## 2023-01-21 DIAGNOSIS — N200 Calculus of kidney: Secondary | ICD-10-CM | POA: Diagnosis not present

## 2023-01-21 DIAGNOSIS — N3281 Overactive bladder: Secondary | ICD-10-CM | POA: Diagnosis not present

## 2023-01-21 NOTE — Progress Notes (Signed)
Subjective:  Patient ID: Kristin [name], female    DOB: Apr 08, 1941,  MRN: [id number]  Kristin [name] presents to clinic today for at risk foot care with history of peripheral neuropathy and painful thick toenails that are difficult to trim. Pain interferes with ambulation. Aggravating factors include wearing enclosed shoe gear. Pain is relieved with periodic professional debridement.  Chief Complaint  Patient presents with   RFC    RFC   New problem(s): None.   PCP is Sharon Seller, NP.  No Known Allergies  Review of Systems: Negative except as noted in the HPI.  Objective:  There were no vitals filed for this visit. Kristin [name] is a pleasant 82 y.o. female WD, WN in NAD. AAO x 3.  Vascular Examination: Capillary refill time <3 seconds b/l LE. Palpable pedal pulses b/l LE. Digital hair present b/l. Trace dependent edema b/l feet. Skin temperature gradient WNL b/l. No varicosities b/l. No cyanosis or clubbing noted b/l LE.Marland Kitchen  Dermatological Examination: Pedal skin with normal turgor, texture and tone b/l. No open wounds. No interdigital macerations b/l.   Toenails 1-5 b/l thickened, discolored, dystrophic with subungual debris. There is pain on palpation to dorsal aspect of nailplates.   Subacute subungual hematoma noted right 3rd toe with cracked nailplate. No erythema, no edema, no drainage, no fluctuance.  Preulcerative porokeratosis distal tip right 2nd digit. No erythema, no edema, no drainage, no fluctuance.  Neurological Examination: Protective sensation intact with 10 gram monofilament LLE; decreased RLE.  Musculoskeletal Examination: Dropfoot right lower extremity. Wearing AFO on right lower extremity. Clawtoe deformity 1-5 right foot. Uses transport chair for mobility.  DME: Uses silicone toe caps on bilateral 2nd toes. Transport chair AFO RLE  Assessment/Plan: 1. Pain due to onychomycosis of toenails of both feet   2. Pre-ulcerative corn or callous   3. Neuropathy  of right lower extremity     -Patient's family member present. All questions/concerns addressed on today's visit. -Consent given for treatment as described below: -Examined patient. -Patient to continue soft, supportive shoe gear daily. -Toenails 1-5 b/l were debrided in length and girth with sterile nail nippers and dremel without iatrogenic bleeding.  -Preulcerative lesion pared distal tip of right 2nd toe utilizing sterile dermal currette. Total number pared=1. Continue digital toe cap daily for protection. -Patient/POA to call should there be question/concern in the interim.   Return in about 3 months (around 04/23/2023).  Freddie Breech, DPM

## 2023-03-03 ENCOUNTER — Ambulatory Visit: Payer: Medicare PPO | Admitting: Nurse Practitioner

## 2023-03-04 ENCOUNTER — Other Ambulatory Visit: Payer: Self-pay | Admitting: Nurse Practitioner

## 2023-03-10 ENCOUNTER — Encounter: Payer: Self-pay | Admitting: Nurse Practitioner

## 2023-03-10 ENCOUNTER — Ambulatory Visit: Payer: Medicare PPO | Admitting: Nurse Practitioner

## 2023-03-10 VITALS — BP 124/80 | HR 82 | Temp 97.1°F

## 2023-03-10 DIAGNOSIS — E785 Hyperlipidemia, unspecified: Secondary | ICD-10-CM | POA: Diagnosis not present

## 2023-03-10 DIAGNOSIS — N3281 Overactive bladder: Secondary | ICD-10-CM

## 2023-03-10 DIAGNOSIS — D509 Iron deficiency anemia, unspecified: Secondary | ICD-10-CM

## 2023-03-10 DIAGNOSIS — K219 Gastro-esophageal reflux disease without esophagitis: Secondary | ICD-10-CM | POA: Diagnosis not present

## 2023-03-10 DIAGNOSIS — I69351 Hemiplegia and hemiparesis following cerebral infarction affecting right dominant side: Secondary | ICD-10-CM

## 2023-03-10 DIAGNOSIS — K649 Unspecified hemorrhoids: Secondary | ICD-10-CM | POA: Diagnosis not present

## 2023-03-10 DIAGNOSIS — I69320 Aphasia following cerebral infarction: Secondary | ICD-10-CM

## 2023-03-10 DIAGNOSIS — N390 Urinary tract infection, site not specified: Secondary | ICD-10-CM

## 2023-03-10 DIAGNOSIS — I10 Essential (primary) hypertension: Secondary | ICD-10-CM

## 2023-03-10 NOTE — Progress Notes (Unsigned)
Careteam: Patient Care Team: Sharon Seller, NP as PCP - General (Geriatric Medicine) Armbruster, Willaim Rayas, MD as Consulting Physician (Gastroenterology) Ranelle Oyster, MD as Consulting Physician (Physical Medicine and Rehabilitation) Register, Joice Lofts, PA-C as Physician Assistant (Dermatology) Freddie Breech, DPM as Consulting Physician (Podiatry) Sherrie George, MD as Consulting Physician (Ophthalmology) Jamison Neighbor, MD (Urology)  PLACE OF SERVICE:  [facility]  Advanced Directive information Does Patient Have a Medical Advance Directive?: Yes, Type of Advance Directive: Out of facility DNR (pink MOST or yellow form), Pre-existing out of facility DNR order (yellow form or pink MOST form): Pink MOST form placed in chart (order not valid for inpatient use), Does patient want to make changes to medical advance directive?: No - Patient declined  No Known Allergies  Chief Complaint  Patient presents with   Medical Management of Chronic Issues    6 month follow-up. Discuss need for pneumonia vaccine,  and AWV (pending for 05/13/2023). Rectal bleeding over the weekend.      HPI: Patient is a 82 y.o. female for routine follow up.   Daughter reports one episode of moderate amount of blood in the toilet.  Stool was dark as well.  No further bleeding.   No shortness of breath or chest palpitations.  No lightheadness or dizziness.   She goes out on her scooters and takes pictures, rearranges things in her house and takes pictures of them- always been artistic .   GERD has been controlled on omeprazole.   Recurrent uti/overactive bladder- no current issues. Continues to follow up with urology   Review of Systems:  Review of Systems  Constitutional:  Negative for chills, fever and weight loss.  HENT:  Negative for tinnitus.   Respiratory:  Negative for cough, sputum production and shortness of breath.   Cardiovascular:  Negative for chest pain, palpitations and  leg swelling.  Gastrointestinal:  Positive for blood in stool. Negative for abdominal pain, constipation, diarrhea and heartburn.  Genitourinary:  Negative for dysuria, frequency and urgency.  Musculoskeletal:  Negative for back pain, falls, joint pain and myalgias.  Skin: Negative.   Neurological:  Negative for dizziness and headaches.  Psychiatric/Behavioral:  Negative for depression and memory loss. The patient does not have insomnia.     Past Medical History:  Diagnosis Date   Arthritis    hands   CAD (coronary artery disease)    a. 2010: stents pRCA and dRCA. b. LHC 12/13/13: Minimal distal LM dz. LAD 95% ostial w/ L-->R collat. LCx mild dz. RCA 95% mid-dRCA. Patent stents proximal and distal RCA. EF not assessed, Nl by nuclear study.      Central retinal vein occlusion, right eye    Chronic kidney disease    hx kidney stone   Depression    Diplopia    Ear pain    right   Eosinophilic esophagitis    Essential hypertension    GERD (gastroesophageal reflux disease)    H/O: whooping cough    Hyperlipidemia with target LDL less than 70    Incontinence    urine   Stroke (HCC) 2001   reesultant Rt spastic hemplegia and aphasia    Past Surgical History:  Procedure Laterality Date   ABDOMINAL HYSTERECTOMY     CAROTID STENT     cataracts     COLONOSCOPY N/A 10/05/2015   Procedure: COLONOSCOPY;  Surgeon: Ruffin Frederick, MD;  Location: [facility];  Service: Gastroenterology;  Laterality: N/A;   CORONARY  ARTERY BYPASS GRAFT N/A 12/17/2013   Procedure: CORONARY ARTERY BYPASS GRAFTING (CABG);  Surgeon: Alleen Borne, MD;  Location: [facility] OR;  Service: Open Heart Surgery;  Laterality: N/A;  Times 2 using left internal mammary artery and endoscopically harvested right saphenous vein   ESOPHAGOGASTRODUODENOSCOPY     ESOPHAGOGASTRODUODENOSCOPY N/A 10/05/2015   Procedure: ESOPHAGOGASTRODUODENOSCOPY (EGD);  Surgeon: Ruffin Frederick, MD;  Location: [facility];  Service:  Gastroenterology;  Laterality: N/A;   INTRAOPERATIVE TRANSESOPHAGEAL ECHOCARDIOGRAM N/A 12/17/2013   Procedure: INTRAOPERATIVE TRANSESOPHAGEAL ECHOCARDIOGRAM;  Surgeon: Alleen Borne, MD;  Location: [facility] OR;  Service: Open Heart Surgery;  Laterality: N/A;   lasix     LEFT HEART CATHETERIZATION WITH CORONARY ANGIOGRAM N/A 12/13/2013   Procedure: LEFT HEART CATHETERIZATION WITH CORONARY ANGIOGRAM;  Surgeon: Corky Crafts, MD;  Location: [facility] CATH LAB;  Service: Cardiovascular;  Laterality: N/A;   retina injection     Social History:   reports that she has never smoked. She has never used smokeless tobacco. She reports current alcohol use. She reports that she does not use drugs.  Family History  Problem Relation Age of Onset   Diabetes Mother    Heart disease Mother    Heart attack Father    Breast cancer Sister     Medications: Patient's Medications  New Prescriptions   No medications on file  Previous Medications   ACETAMINOPHEN (TYLENOL) 500 MG TABLET    Take 1,000 mg by mouth in the morning, at noon, and at bedtime.   ASPIRIN 81 MG TABLET    Take 81 mg by mouth daily.   ATORVASTATIN (LIPITOR) 40 MG TABLET    tAKE ONE TABLET BY MOUTH DAILY.   CHOLECALCIFEROL (VITAMIN D3) 50 MCG (2000 UT) CAPSULE    Take 2,000 Units by mouth at bedtime.   CLOPIDOGREL (PLAVIX) 75 MG TABLET    TAKE ONE TABLET BY MOUTH DAILY   COENZYME Q10 (COQ-10 PO)    Take 1 tablet daily by mouth.   DESMOPRESSIN (DDAVP) 0.2 MG TABLET    Take 0.4 mg by mouth at bedtime.   DICLOFENAC SODIUM (VOLTAREN) 1 % GEL    APPLY TO THE AFFECTED AREA(S) THREE TIMES DAILY.   DOCUSATE SODIUM (COLACE) 100 MG CAPSULE    Take 100 mg by mouth daily.   DULOXETINE (CYMBALTA) 20 MG CAPSULE    TAKE ONE CAPSULE BY MOUTH DAILY   ESTRADIOL (ESTRACE) 0.1 MG/GM VAGINAL CREAM    Place 1 Applicatorful vaginally 3 (three) times a week.   GABAPENTIN (NEURONTIN) 300 MG CAPSULE    TAKE ONE CAPSULE BY MOUTH THREE TIMES DAILY   HYDROCORTISONE  (ANUSOL-HC) 2.5 % RECTAL CREAM    Place 1 Application rectally as needed for hemorrhoids.   METHOCARBAMOL (ROBAXIN) 500 MG TABLET    Take 1 tablet (500 mg total) by mouth every 8 (eight) hours as needed for muscle spasms.   MIRABEGRON ER (MYRBETRIQ) 50 MG TB24 TABLET    Take 50 mg by mouth daily.   NITROGLYCERIN (NITROSTAT) 0.4 MG SL TABLET    Place 1 tablet (0.4 mg total) under the tongue every 5 (five) minutes as needed for chest pain.   OMEPRAZOLE (PRILOSEC) 20 MG CAPSULE    TAKE ONE CAPSULE BY MOUTH DAILY   POLYETHYLENE GLYCOL (MIRALAX / GLYCOLAX) 17 G PACKET    Take 17 g by mouth daily.   TRIMETHOPRIM (TRIMPEX) 100 MG TABLET    Take 100 mg by mouth at bedtime.   TURMERIC PO  Take 1 tablet by mouth daily.   Modified Medications   No medications on file  Discontinued Medications   No medications on file    Physical Exam:  Vitals:   03/10/23 1137  Height: 5\' 5"  (1.651 m)   Body mass index is 31 kg/m. Wt Readings from Last 3 Encounters:  05/24/22 186 lb 4.8 oz (84.5 kg)  03/02/21 191 lb (86.6 kg)  02/23/21 170 lb (77.1 kg)    Physical Exam Constitutional:      General: She is not in acute distress.    Appearance: She is well-developed. She is not diaphoretic.  HENT:     Head: Normocephalic and atraumatic.     Mouth/Throat:     Pharynx: No oropharyngeal exudate.  Eyes:     Conjunctiva/sclera: Conjunctivae normal.     Pupils: Pupils are equal, round, and reactive to light.  Cardiovascular:     Rate and Rhythm: Normal rate and regular rhythm.     Heart sounds: Normal heart sounds.  Pulmonary:     Effort: Pulmonary effort is normal.     Breath sounds: Normal breath sounds.  Abdominal:     General: Bowel sounds are normal.     Palpations: Abdomen is soft.  Genitourinary:    Rectum: External hemorrhoid present.  Musculoskeletal:     Cervical back: Normal range of motion and neck supple.     Right lower leg: No edema.     Left lower leg: No edema.  Skin:    General:  Skin is warm and dry.  Neurological:     Mental Status: She is alert.  Psychiatric:        Mood and Affect: Mood normal.     Labs reviewed: Basic Metabolic Panel: Recent Labs    05/23/22 0305 05/24/22 0340 09/02/22 1351  NA 138 137 138  K 3.6 3.8 4.0  CL 106 106 103  CO2 25 25 28   GLUCOSE 118* 120* 115*  BUN 26* 9 18  CREATININE 0.60 0.49 0.57*  CALCIUM 8.4* 8.5* 9.2  MG  --  1.7  --   PHOS  --  3.6  --    Liver Function Tests: Recent Labs    05/22/22 2039 09/02/22 1351  AST 19 15  ALT 12 9  ALKPHOS 70  --   BILITOT 0.7 0.5  PROT 7.4 6.6  ALBUMIN 4.1  --    No results for input(s): "LIPASE", "AMYLASE" in the last 8760 hours. No results for input(s): "AMMONIA" in the last 8760 hours. CBC: Recent Labs    05/22/22 2030 05/23/22 0020 05/23/22 0305 05/23/22 0639 05/24/22 0340 09/02/22 1351  WBC 6.5 5.0  --   --  6.4 5.4  NEUTROABS 4.5  --   --   --  5.0 3,667  HGB 13.8 8.8*   < > 11.9* 12.8 12.4  HCT 43.6 28.7*   < > 37.7 39.5 40.7  MCV 93.2 95.3  --   --  90.2 82.4  PLT 256 167  --   --  221 291   < > = values in this interval not displayed.   Lipid Panel: No results for input(s): "CHOL", "HDL", "LDLCALC", "TRIG", "CHOLHDL", "LDLDIRECT" in the last 8760 hours. TSH: No results for input(s): "TSH" in the last 8760 hours. A1C: Lab Results  Component Value Date   HGBA1C 5.4 06/02/2018     Assessment/Plan 1. Essential hypertension -Blood pressure well controlled, goal bp <140/90 Continue current medications and dietary modifications follow metabolic  panel - COMPLETE METABOLIC PANEL WITH GFR - CBC with Differential/Platelet  2. Spastic hemiplegia of right dominant side as late effect of cerebral infarction (HCC) Stable, continues on asa, plavix, statin with proper BP control.   3. Recurrent UTI -continues to follow up with urology, controlled on trimethoprim  4. OAB (overactive bladder) Stable on myrbetriq   5. Iron deficiency anemia,  unspecified iron deficiency anemia type Not currently on iron supplement  - CBC with Differential/Platelet - Iron, TIBC and Ferritin Panel  6. Aphasia as late effect of stroke -continue supportive care  7. Gastroesophageal reflux disease without esophagitis Controlled on omeprazole- discussed interaction with plavix but daughter does not wish to change medication.   8. Hyperlipidemia, unspecified hyperlipidemia type Continues on lipitor with dietary modifications  - Lipid panel  9. Hemorrhoids, unspecified hemorrhoid type Noted to rectum, no active bleeding or signs of blood loss Internal exam without signs of bleeding. Will get CBC to evaluate   Return in about 6 months (around 09/07/2023) for routine follow up .  Janene Harvey. Biagio [facility] [phone]

## 2023-03-11 LAB — LIPID PANEL
Cholesterol: 142 mg/dL (ref <200)
HDL: 49 mg/dL — ABNORMAL LOW (ref >=50)
LDL Cholesterol (Calc): 70 mg/dL
Non-HDL Cholesterol (Calc): 93 mg/dL (ref <130)
Total CHOL/HDL Ratio: 2.9 (calc) (ref <5.0)
Triglycerides: 150 mg/dL — ABNORMAL HIGH (ref <150)

## 2023-03-11 LAB — CBC WITH DIFFERENTIAL/PLATELET
Absolute Lymphocytes: 1030 {cells}/uL (ref 850–3900)
Absolute Monocytes: 498 {cells}/uL (ref 200–950)
Basophils Absolute: 28 {cells}/uL (ref 0–200)
Basophils Relative: 0.5 %
Eosinophils Absolute: 196 {cells}/uL (ref 15–500)
Eosinophils Relative: 3.5 %
HCT: 39.7 % (ref 35.0–45.0)
Hemoglobin: 12.2 g/dL (ref 11.7–15.5)
MCH: 25.6 pg — ABNORMAL LOW (ref 27.0–33.0)
MCHC: 30.7 g/dL — ABNORMAL LOW (ref 32.0–36.0)
MCV: 83.2 fL (ref 80.0–100.0)
MPV: 9.9 fL (ref 7.5–12.5)
Monocytes Relative: 8.9 %
Neutro Abs: 3847 {cells}/uL (ref 1500–7800)
Neutrophils Relative %: 68.7 %
Platelets: 290 10*3/uL (ref 140–400)
RBC: 4.77 10*6/uL (ref 3.80–5.10)
RDW: 13.4 % (ref 11.0–15.0)
Total Lymphocyte: 18.4 %
WBC: 5.6 10*3/uL (ref 3.8–10.8)

## 2023-03-11 LAB — COMPLETE METABOLIC PANEL WITH GFR
AG Ratio: 1.8 (calc) (ref 1.0–2.5)
ALT: 10 U/L (ref 6–29)
AST: 12 U/L (ref 10–35)
Albumin: 4.2 g/dL (ref 3.6–5.1)
Alkaline phosphatase (APISO): 88 U/L (ref 37–153)
BUN: 21 mg/dL (ref 7–25)
CO2: 29 mmol/L (ref 20–32)
Calcium: 9.5 mg/dL (ref 8.6–10.4)
Chloride: 104 mmol/L (ref 98–110)
Creat: 0.66 mg/dL (ref 0.60–0.95)
Globulin: 2.4 g/dL (ref 1.9–3.7)
Glucose, Bld: 125 mg/dL (ref 65–139)
Potassium: 4.1 mmol/L (ref 3.5–5.3)
Sodium: 140 mmol/L (ref 135–146)
Total Bilirubin: 0.5 mg/dL (ref 0.2–1.2)
Total Protein: 6.6 g/dL (ref 6.1–8.1)
eGFR: 86 mL/min/{1.73_m2} (ref >=60)

## 2023-03-11 LAB — IRON,TIBC AND FERRITIN PANEL
%SAT: 5 % — ABNORMAL LOW (ref 16–45)
Ferritin: 10 ng/mL — ABNORMAL LOW (ref 16–288)
Iron: 22 ug/dL — ABNORMAL LOW (ref 45–160)
TIBC: 416 ug/dL (ref 250–450)

## 2023-03-18 ENCOUNTER — Other Ambulatory Visit: Payer: Self-pay

## 2023-04-11 ENCOUNTER — Encounter: Payer: Self-pay | Admitting: Physical Medicine and Rehabilitation

## 2023-04-29 ENCOUNTER — Ambulatory Visit: Payer: Medicare PPO | Admitting: Podiatry

## 2023-04-29 ENCOUNTER — Encounter: Payer: Self-pay | Admitting: Podiatry

## 2023-04-29 DIAGNOSIS — M79675 Pain in left toe(s): Secondary | ICD-10-CM | POA: Diagnosis not present

## 2023-04-29 DIAGNOSIS — M79674 Pain in right toe(s): Secondary | ICD-10-CM

## 2023-04-29 DIAGNOSIS — B351 Tinea unguium: Secondary | ICD-10-CM

## 2023-04-29 DIAGNOSIS — G5791 Unspecified mononeuropathy of right lower limb: Secondary | ICD-10-CM | POA: Diagnosis not present

## 2023-04-30 DIAGNOSIS — R2681 Unsteadiness on feet: Secondary | ICD-10-CM | POA: Diagnosis not present

## 2023-04-30 DIAGNOSIS — R278 Other lack of coordination: Secondary | ICD-10-CM | POA: Diagnosis not present

## 2023-04-30 DIAGNOSIS — M6259 Muscle wasting and atrophy, not elsewhere classified, multiple sites: Secondary | ICD-10-CM | POA: Diagnosis not present

## 2023-04-30 DIAGNOSIS — I679 Cerebrovascular disease, unspecified: Secondary | ICD-10-CM | POA: Diagnosis not present

## 2023-05-05 DIAGNOSIS — R278 Other lack of coordination: Secondary | ICD-10-CM | POA: Diagnosis not present

## 2023-05-05 DIAGNOSIS — M6259 Muscle wasting and atrophy, not elsewhere classified, multiple sites: Secondary | ICD-10-CM | POA: Diagnosis not present

## 2023-05-05 DIAGNOSIS — R2689 Other abnormalities of gait and mobility: Secondary | ICD-10-CM | POA: Diagnosis not present

## 2023-05-05 DIAGNOSIS — I679 Cerebrovascular disease, unspecified: Secondary | ICD-10-CM | POA: Diagnosis not present

## 2023-05-05 DIAGNOSIS — R2681 Unsteadiness on feet: Secondary | ICD-10-CM | POA: Diagnosis not present

## 2023-05-05 NOTE — Progress Notes (Signed)
  Subjective:  Patient ID: Kristin [name], female    DOB: 09-25-1940,  MRN: [id number]  82 y.o. female presents at risk foot care with history of peripheral neuropathy and painful, discolored, thick toenails which interfere with daily activities. She is accompanied by her daughter on today's visit. Chief Complaint  Patient presents with   Nail Problem    Patient saw her PCP about 2 months ago    New problem(s): None   PCP is Caro Harlene POUR, NP  No Known Allergies  Review of Systems: Negative except as noted in the HPI.   Objective:  Kristin [name] is a pleasant 83 y.o. female WD, WN in NAD. AAO x 3.  Vascular Examination: Vascular status intact b/l with palpable pedal pulses. CFT immediate b/l. Pedal hair present. No edema. No pain with calf compression b/l. Skin temperature gradient WNL b/l. No varicosities noted. No cyanosis or clubbing noted.  Neurological Examination: Protective sensation intact with 10 gram monofilament LLE; decreased RLE.  Dermatological Examination: Pedal skin with normal turgor, texture and tone b/l. No open wounds nor interdigital macerations noted. Toenails 1-5 b/l thick, discolored, elongated with subungual debris and pain on dorsal palpation. No hyperkeratotic lesions noted b/l.   Musculoskeletal Examination: Dropfoot right lower extremity. Wearing AFO on right lower extremity. Clawtoe deformity 1-5 right foot. Uses transport chair for mobility.  DME: Transport chair AFO RLE  Radiographs: None  Last A1c:       No data to display           Assessment:   1. Pain due to onychomycosis of toenails of both feet   2. Neuropathy of right lower extremity    Plan:  -Patient was evaluated today. All questions/concerns addressed on today's visit. -Continue supportive shoe gear daily. -Mycotic toenails 1-5 bilaterally were debrided in length and girth with sterile nail nippers and dremel without incident. -Continue digital toe cap to afftected digits  daily for protection. -Patient/POA to call should there be question/concern in the interim.  Return in about 3 months (around 07/28/2023).  Delon LITTIE Merlin, DPM      [place] LOCATION: [address] [place] [address]                   Office [phone]   [facility] LOCATION: [address] [place], [place] [address] Office [phone]

## 2023-05-06 DIAGNOSIS — R2681 Unsteadiness on feet: Secondary | ICD-10-CM | POA: Diagnosis not present

## 2023-05-06 DIAGNOSIS — I679 Cerebrovascular disease, unspecified: Secondary | ICD-10-CM | POA: Diagnosis not present

## 2023-05-06 DIAGNOSIS — R278 Other lack of coordination: Secondary | ICD-10-CM | POA: Diagnosis not present

## 2023-05-06 DIAGNOSIS — M6259 Muscle wasting and atrophy, not elsewhere classified, multiple sites: Secondary | ICD-10-CM | POA: Diagnosis not present

## 2023-05-07 ENCOUNTER — Emergency Department (HOSPITAL_COMMUNITY): Payer: Medicare PPO

## 2023-05-07 ENCOUNTER — Encounter (HOSPITAL_COMMUNITY): Payer: Self-pay

## 2023-05-07 ENCOUNTER — Emergency Department (HOSPITAL_COMMUNITY)
Admission: EM | Admit: 2023-05-07 | Discharge: 2023-05-07 | Disposition: A | Discharge status: Home / Self Care | Payer: Medicare PPO

## 2023-05-07 ENCOUNTER — Other Ambulatory Visit: Payer: Self-pay

## 2023-05-07 DIAGNOSIS — L03115 Cellulitis of right lower limb: Secondary | ICD-10-CM

## 2023-05-07 DIAGNOSIS — Z66 Do not resuscitate: Secondary | ICD-10-CM | POA: Insufficient documentation

## 2023-05-07 DIAGNOSIS — R531 Weakness: Secondary | ICD-10-CM | POA: Diagnosis not present

## 2023-05-07 DIAGNOSIS — Z7409 Other reduced mobility: Secondary | ICD-10-CM | POA: Diagnosis present

## 2023-05-07 DIAGNOSIS — Z8673 Personal history of transient ischemic attack (TIA), and cerebral infarction without residual deficits: Secondary | ICD-10-CM | POA: Insufficient documentation

## 2023-05-07 DIAGNOSIS — I771 Stricture of artery: Secondary | ICD-10-CM | POA: Diagnosis not present

## 2023-05-07 DIAGNOSIS — Z7902 Long term (current) use of antithrombotics/antiplatelets: Secondary | ICD-10-CM | POA: Diagnosis not present

## 2023-05-07 DIAGNOSIS — M6281 Muscle weakness (generalized): Secondary | ICD-10-CM | POA: Insufficient documentation

## 2023-05-07 DIAGNOSIS — R609 Edema, unspecified: Secondary | ICD-10-CM | POA: Diagnosis not present

## 2023-05-07 DIAGNOSIS — G8190 Hemiplegia, unspecified affecting unspecified side: Secondary | ICD-10-CM | POA: Diagnosis not present

## 2023-05-07 DIAGNOSIS — Z7982 Long term (current) use of aspirin: Secondary | ICD-10-CM | POA: Diagnosis not present

## 2023-05-07 DIAGNOSIS — Z20822 Contact with and (suspected) exposure to covid-19: Secondary | ICD-10-CM | POA: Insufficient documentation

## 2023-05-07 DIAGNOSIS — Z79899 Other long term (current) drug therapy: Secondary | ICD-10-CM | POA: Insufficient documentation

## 2023-05-07 DIAGNOSIS — R4182 Altered mental status, unspecified: Secondary | ICD-10-CM | POA: Diagnosis present

## 2023-05-07 DIAGNOSIS — R918 Other nonspecific abnormal finding of lung field: Secondary | ICD-10-CM | POA: Diagnosis not present

## 2023-05-07 DIAGNOSIS — J9811 Atelectasis: Secondary | ICD-10-CM | POA: Diagnosis not present

## 2023-05-07 DIAGNOSIS — I69351 Hemiplegia and hemiparesis following cerebral infarction affecting right dominant side: Secondary | ICD-10-CM | POA: Diagnosis not present

## 2023-05-07 LAB — URINALYSIS, ROUTINE W REFLEX MICROSCOPIC
Bacteria, UA: NONE SEEN
Bilirubin Urine: NEGATIVE
Glucose, UA: NEGATIVE mg/dL
Ketones, ur: 5 mg/dL — AB
Leukocytes,Ua: NEGATIVE
Nitrite: NEGATIVE
Protein, ur: 30 mg/dL — AB
Specific Gravity, Urine: 1.026 (ref 1.005–1.030)
pH: 5 (ref 5.0–8.0)

## 2023-05-07 LAB — CBC
HCT: 45.3 % (ref 36.0–46.0)
Hemoglobin: 13.9 g/dL (ref 12.0–15.0)
MCH: 26.7 pg (ref 26.0–34.0)
MCHC: 30.7 g/dL (ref 30.0–36.0)
MCV: 87.1 fL (ref 80.0–100.0)
Platelets: 279 10*3/uL (ref 150–400)
RBC: 5.2 MIL/uL — ABNORMAL HIGH (ref 3.87–5.11)
RDW: 15.6 % — ABNORMAL HIGH (ref 11.5–15.5)
WBC: 8.6 10*3/uL (ref 4.0–10.5)
nRBC: 0 % (ref 0.0–0.2)

## 2023-05-07 LAB — RESP PANEL BY RT-PCR (RSV, FLU A&B, COVID)  RVPGX2
Influenza A by PCR: NEGATIVE
Influenza B by PCR: NEGATIVE
Resp Syncytial Virus by PCR: NEGATIVE
SARS Coronavirus 2 by RT PCR: NEGATIVE

## 2023-05-07 LAB — BASIC METABOLIC PANEL
Anion gap: 11 (ref 5–15)
BUN: 17 mg/dL (ref 8–23)
CO2: 26 mmol/L (ref 22–32)
Calcium: 9.3 mg/dL (ref 8.9–10.3)
Chloride: 102 mmol/L (ref 98–111)
Creatinine, Ser: 0.51 mg/dL (ref 0.44–1.00)
GFR, Estimated: >60 mL/min (ref >60)
Glucose, Bld: 113 mg/dL — ABNORMAL HIGH (ref 70–99)
Potassium: 3.2 mmol/L — ABNORMAL LOW (ref 3.5–5.1)
Sodium: 139 mmol/L (ref 135–145)

## 2023-05-07 MED ORDER — CEPHALEXIN 500 MG PO CAPS: 500.0000 mg | ORAL_CAPSULE | Freq: Two times a day (BID) | ORAL | 0 refills | Status: AC

## 2023-05-07 MED ORDER — CEPHALEXIN 500 MG PO CAPS
500.0000 mg | ORAL_CAPSULE | Freq: Once | ORAL | Status: DC
  Filled 2023-05-07: qty 1

## 2023-05-07 NOTE — ED Provider Notes (Signed)
 [place] EMERGENCY DEPARTMENT AT [facility] Provider Note   CSN: [id number] Arrival date & time: 05/07/23  1124     History  Chief Complaint  Patient presents with   Weakness    Kristin [name] is a 83 y.o. female.  83 year old female with past medical history of CVA with right-sided deficits at baseline presenting to the emergency department today with generalized weakness.  The patient is currently in independent living.  She does have significant right-sided deficits from previous stroke.  She has apparently not been out of her room now for the past few days.  The family is currently working on getting her into an assisted living room and the plan is to discharge her to this if her workup is unremarkable.  The patient is denying any complaints currently.  She does have significant expressive aphasia which does make obtaining history difficult.  She does have a history of recurrent urinary tract infections in the past.   Weakness      Home Medications Prior to Admission medications   Medication Sig Start Date End Date Taking? Authorizing Provider  acetaminophen  (TYLENOL ) 500 MG tablet Take 1,000 mg by mouth in the morning, at noon, and at bedtime.    [provider]  aspirin  81 MG tablet Take 81 mg by mouth daily.    [provider]  atorvastatin  (LIPITOR ) 40 MG tablet tAKE ONE TABLET BY MOUTH DAILY. 03/04/23   Verma Gobble, NP  Cholecalciferol  (VITAMIN D3) 50 MCG (2000 UT) capsule Take 2,000 Units by mouth at bedtime.    [provider]  clopidogrel  (PLAVIX ) 75 MG tablet TAKE ONE TABLET BY MOUTH DAILY 09/02/22   Eubanks, Jessica K, NP  Coenzyme Q10 (COQ-10 PO) Take 1 tablet daily by mouth.    [provider]  desmopressin  (DDAVP ) 0.2 MG tablet Take 0.4 mg by mouth at bedtime.    [provider]  diclofenac  Sodium (VOLTAREN ) 1 % GEL APPLY TO THE AFFECTED AREA(S) THREE TIMES DAILY. 02/13/21   Raulkar, Keven Pel, MD   docusate sodium  (COLACE) 100 MG capsule Take 100 mg by mouth daily.    [provider]  DULoxetine  (CYMBALTA ) 20 MG capsule TAKE ONE CAPSULE BY MOUTH DAILY 12/04/22   Eubanks, Jessica K, NP  estradiol  (ESTRACE ) 0.1 MG/GM vaginal cream Place 1 Applicatorful vaginally 3 (three) times a week. 03/02/20   [provider]  ferrous sulfate  325 (65 FE) MG tablet Take 325 mg by mouth daily at 12 noon.    [provider]  gabapentin  (NEURONTIN ) 300 MG capsule TAKE ONE CAPSULE BY MOUTH THREE TIMES DAILY 12/04/22   Raulkar, Keven Pel, MD  hydrocortisone  (ANUSOL -HC) 2.5 % rectal cream Place 1 Application rectally as needed for hemorrhoids. 08/12/22   Kenney Peacemaker, MD  methocarbamol  (ROBAXIN ) 500 MG tablet Take 1 tablet (500 mg total) by mouth every 8 (eight) hours as needed for muscle spasms. 03/30/21   Ngetich, Dinah C, NP  mirabegron  ER (MYRBETRIQ ) 50 MG TB24 tablet Take 50 mg by mouth daily. 08/25/20   [provider]  nitroGLYCERIN  (NITROSTAT ) 0.4 MG SL tablet Place 1 tablet (0.4 mg total) under the tongue every 5 (five) minutes as needed for chest pain. 10/30/21   Verma Gobble, NP  omeprazole  (PRILOSEC) 20 MG capsule TAKE ONE CAPSULE BY MOUTH DAILY 12/04/22   Eubanks, Jessica K, NP  polyethylene glycol (MIRALAX  / GLYCOLAX ) 17 g packet Take 17 g by mouth daily. 05/24/22   Lavaughn Portland, MD  trimethoprim  (TRIMPEX ) 100 MG tablet Take 100 mg by mouth at bedtime. 04/29/22   [provider]  TURMERIC PO Take 1 tablet by mouth daily.     [provider]      Allergies    Patient has no known allergies.    Review of Systems   Review of Systems  Neurological:  Positive for weakness.  All other systems reviewed and are negative.   Physical Exam Updated Vital Signs BP (!) 170/86   Pulse 87   Temp 98.8 F (37.1 C) (Oral)   Resp 16   Ht 5\' 5"  (1.651 m)   Wt 84 kg   SpO2 99%   BMI 30.82 kg/m  Physical Exam Vitals and nursing note reviewed.   Gen:  NAD Eyes: PERRL, EOMI HEENT: no oropharyngeal swelling Neck: trachea midline Resp: clear to auscultation bilaterally Card: RRR, no murmurs, rubs, or gallops Abd: nontender, nondistended Extremities: no calf tenderness, no edema Vascular: 2+ radial pulses bilaterally, 2+ DP pulses bilaterally Neuro: Chronic right-sided deficits noted, the patient does have normal strength in the left upper and lower extremities, significant expressive aphasia noted Skin: no rashes Psyc: acting appropriately   ED Results / Procedures / Treatments   Labs (all labs ordered are listed, but only abnormal results are displayed) Labs Reviewed  BASIC METABOLIC PANEL - Abnormal; Notable for the following components:      Result Value   Potassium 3.2 (*)    Glucose, Bld 113 (*)    All other components within normal limits  CBC - Abnormal; Notable for the following components:   RBC 5.20 (*)    RDW 15.6 (*)    All other components within normal limits  RESP PANEL BY RT-PCR (RSV, FLU A&B, COVID)  RVPGX2  URINALYSIS, ROUTINE W REFLEX MICROSCOPIC    EKG None  Radiology DG Chest Port 1 View Result Date: 05/07/2023 CLINICAL DATA:  Weakness EXAM: PORTABLE CHEST 1 VIEW COMPARISON:  X-ray 07/07/2019 FINDINGS: Kyphotic rotated x-ray obscures the right apex. Status post median sternotomy. Normal cardiopericardial silhouette. Tortuous ectatic aorta. Slight blunting of the left costophrenic angle. Tiny effusion versus pleural thickening. There is some basilar atelectasis. No pneumothorax or edema. Curvature and degenerative changes of the spine. Osteopenia IMPRESSION: Postop chest. Basilar atelectasis or scar. Tiny left pleural effusion or thickening. Rotated kyphotic x-ray obscures the right lung apex. Additional imaging as clinically appropriate Electronically Signed   By: Adrianna Horde M.D.   On: 05/07/2023 14:44    Procedures Procedures    Medications Ordered in ED Medications - No data to display  ED  Course/ Medical Decision Making/ A&P                                 Medical Decision Making 83 year old female with past medical history of CVA with right-sided deficits presenting to the emergency department today with generalized weakness.  I will further evaluate the patient here with basic labs to evaluate for electrolyte abnormalities or anemia.  Will obtain a chest x-ray and urinalysis as well as an RSV/COVID/flu swab on the patient to evaluate for infectious etiology.  Also obtain a CT scan of her head to evaluate for intracranial hemorrhage or mass lesion.  I will reevaluate for ultimate disposition.  I will have our social worker see the patient to make sure that she has adequate care at home if she were to be discharged.  The patient's labs  are reassuring.  Urinalysis is pending at the time of signout.  The plan is for discharge if unremarkable.  Amount and/or Complexity of Data Reviewed Labs: ordered. Radiology: ordered.           Final Clinical Impression(s) / ED Diagnoses Final diagnoses:  Generalized weakness    Rx / DC Orders ED Discharge Orders     None         Carin Charleston, MD 05/07/23 1524

## 2023-05-07 NOTE — Discharge Instructions (Addendum)
 Your workup today was reassuring.  Please follow-up with your doctor and return to the ER for worsening symptoms.

## 2023-05-07 NOTE — Care Management (Addendum)
 Transition of Care [facility]) - Emergency Department Mini Assessment   Patient Details  Name: Kristin [name] MRN: [id number] Date of Birth: 09-23-1940  [facility] of [facility]) CM/SW Contact:    Naoma Bacca, RN Phone Number: 05/07/2023, 2:55 PM   Clinical Narrative: Received TOC consult for potential home needs. Per chart review patient currently at [facility] ED for generalized weakness. This RNCM spoke with patient and her daughter Kristin [name] [phone] at bedside to discuss any home needs post discharge. Kristin [name] reports prior to admission patient has the following DME: scooter, walker, wc and lift chair. Patient currently resides at [facility] ILF and plans to transition to the assisted living side of [facility]. Kristin [name] has a meeting with [facility] today at 3:30pm. Kristin [name] and patient declined any home health and TOC assistance. Kristin [name] reports they have everything they need.  - Left voicemail message w/ Brittney w/ [facility] to see if any additional needs from the [facility] department.  No additional TOC needs.   ED Mini Assessment: What brought you to the Emergency Department? : generalized weakness  Barriers to Discharge: No Barriers Identified  Barrier interventions: discussed home needs  Means of departure: Car  Interventions which prevented an admission or readmission: Home Health Consult or Services    Patient Contact and Communications        ,          Patient states their goals for this hospitalization and ongoing recovery are:: to feel better CMS Medicare.gov Compare Post Acute Care list provided to::  (n/a) Choice offered to / list presented to : NA  Admission diagnosis:  weakness, cant walk Patient Active Problem List   Diagnosis Date Noted   Diverticulosis of colon with hemorrhage 05/24/2022   Acute lower GI bleeding 05/23/2022   Acute blood loss anemia 05/23/2022   PAD (peripheral artery disease) (HCC) 08/31/2021   Recurrent UTI 08/31/2021   OAB (overactive  bladder) 02/29/2020   Fever 07/07/2019   Nephrolithiasis 02/24/2017   Colitis 02/24/2017   Hypokalemia 02/24/2017   AKI (acute kidney injury) (HCC) 02/24/2017   History of stroke 02/04/2017   Female stress incontinence 01/09/2017   Ureteral calculus, right 01/09/2017   Nocturia 12/26/2016   Left ear pain 11/07/2016   Chest pain syndrome    Chest pain 10/17/2015   IDA (iron deficiency anemia) 10/04/2015   Right rotator cuff tendinitis 08/14/2015   Shoulder subluxation, right 07/06/2014   S/P CABG x 2 12/17/2013   Essential hypertension    Unstable angina (HCC) 12/09/2013   CAD (coronary artery disease) 12/09/2013   Dyslipidemia, goal LDL below 70 12/09/2013   Prolonged QT interval 12/09/2013   Unilateral primary osteoarthritis, right knee 06/01/2012   Lumbago 06/01/2012   Spastic hemiplegia affecting dominant side (HCC) 06/01/2012   Aphasia as late effect of stroke 06/01/2012   PCP:  Verma Gobble, NP Pharmacy:   [facility] [place], [place] - [address] [place] [address] Phone: [phone] Fax: [phone]

## 2023-05-07 NOTE — ED Triage Notes (Signed)
 BIBA from [facility] c/o weakness and unable to walk x3 days.  Ambulatory at baseline Pt has right sided deficit from prior stroke.  Most/DNR at bedside.

## 2023-05-07 NOTE — ED Provider Notes (Signed)
 Care of patient received from prior provider at 4:39 PM, please see their note for complete H/P and care plan.  Received handoff per ED course.  Clinical Course as of 05/07/23 1639  Wed May 07, 2023  1525 Stable  Weakness. UA pending. Reassess [CC]    Clinical Course User Index [CC] Onetha Bile, MD    Reassessment: UA was nondiagnostic.  I reevaluated the patient at bedside.  She has a relatively substantial amount of right lower extremity erythema and tenderness to palpation that she is endorsing.  She does not have a systemic leukocytosis nor fever, however per the family she has had relatively rapid deterioration over the past 72 hours.  This may just be exacerbation of her underlying cognitive impairment, but given the rapid onset and findings on exam, cellulitis of the lower extremity in the setting of baseline venous stasis would also be a reasonable diagnosis. Discussed risk of proceeding with antibiotic therapy (C. difficile) versus benefit and family is in agreement with expected benefit of trialing therapy.  Will treat with Keflex  500 mg twice daily recommend follow-up back at her facility. Disposition:  I have considered need for hospitalization, however, considering all of the above, I believe this patient is stable for discharge at this time.  Patient/family educated about specific return precautions for given chief complaint and symptoms.  Patient/family educated about follow-up with PCP.     Patient/family expressed understanding of return precautions and need for follow-up. Patient spoken to regarding all imaging and laboratory results and appropriate follow up for these results. All education provided in verbal form with additional information in written form. Time was allowed for answering of patient questions. Patient discharged.    Emergency Department Medication Summary:   Medications  cephALEXin  (KEFLEX ) capsule 500 mg (has no administration in time range)              Onetha Bile, MD 05/07/23 2317

## 2023-05-08 ENCOUNTER — Encounter: Payer: Self-pay | Admitting: Physical Medicine and Rehabilitation

## 2023-05-08 DIAGNOSIS — M6259 Muscle wasting and atrophy, not elsewhere classified, multiple sites: Secondary | ICD-10-CM | POA: Diagnosis not present

## 2023-05-08 DIAGNOSIS — R278 Other lack of coordination: Secondary | ICD-10-CM | POA: Diagnosis not present

## 2023-05-08 DIAGNOSIS — R2681 Unsteadiness on feet: Secondary | ICD-10-CM | POA: Diagnosis not present

## 2023-05-08 DIAGNOSIS — R2689 Other abnormalities of gait and mobility: Secondary | ICD-10-CM | POA: Diagnosis not present

## 2023-05-08 DIAGNOSIS — I679 Cerebrovascular disease, unspecified: Secondary | ICD-10-CM | POA: Diagnosis not present

## 2023-05-08 LAB — URINE CULTURE: Culture: NO GROWTH

## 2023-05-09 ENCOUNTER — Encounter: Payer: Self-pay | Admitting: Nurse Practitioner

## 2023-05-09 NOTE — Telephone Encounter (Signed)
Message forwarded to medical assistant in CI to start [facility] as requested by provider and message also forwarded to PCP Janyth Contes, Janene Harvey, NP because patient has continuous questions.

## 2023-05-09 NOTE — Telephone Encounter (Signed)
FL2 forms are completed and placed in the back to be signed

## 2023-05-09 NOTE — Telephone Encounter (Signed)
Can we get an FL2 completed for her?

## 2023-05-12 DIAGNOSIS — R278 Other lack of coordination: Secondary | ICD-10-CM | POA: Diagnosis not present

## 2023-05-12 DIAGNOSIS — I679 Cerebrovascular disease, unspecified: Secondary | ICD-10-CM | POA: Diagnosis not present

## 2023-05-12 DIAGNOSIS — M6259 Muscle wasting and atrophy, not elsewhere classified, multiple sites: Secondary | ICD-10-CM | POA: Diagnosis not present

## 2023-05-12 DIAGNOSIS — R2681 Unsteadiness on feet: Secondary | ICD-10-CM | POA: Diagnosis not present

## 2023-05-12 NOTE — Telephone Encounter (Signed)
FL2 form completed and signed.  Stepdaughter picked up Fl2 Form Copy sent to scanning.

## 2023-05-13 ENCOUNTER — Inpatient Hospital Stay (HOSPITAL_COMMUNITY): Payer: Medicare PPO

## 2023-05-13 ENCOUNTER — Other Ambulatory Visit: Payer: Self-pay

## 2023-05-13 ENCOUNTER — Emergency Department (HOSPITAL_COMMUNITY): Payer: Medicare PPO

## 2023-05-13 ENCOUNTER — Inpatient Hospital Stay (HOSPITAL_COMMUNITY)
Admission: EM | Admit: 2023-05-13 | Discharge: 2023-05-16 | DRG: 065 | Disposition: A | Discharge status: Skilled Nursing Facility | Payer: Medicare PPO | Source: Skilled Nursing Facility | Attending: Internal Medicine | Admitting: Internal Medicine

## 2023-05-13 ENCOUNTER — Encounter: Payer: Medicare PPO | Admitting: Nurse Practitioner

## 2023-05-13 DIAGNOSIS — Z79818 Long term (current) use of other agents affecting estrogen receptors and estrogen levels: Secondary | ICD-10-CM

## 2023-05-13 DIAGNOSIS — I6381 Other cerebral infarction due to occlusion or stenosis of small artery: Secondary | ICD-10-CM | POA: Diagnosis not present

## 2023-05-13 DIAGNOSIS — M25462 Effusion, left knee: Secondary | ICD-10-CM | POA: Diagnosis not present

## 2023-05-13 DIAGNOSIS — Z951 Presence of aortocoronary bypass graft: Secondary | ICD-10-CM | POA: Diagnosis not present

## 2023-05-13 DIAGNOSIS — Z803 Family history of malignant neoplasm of breast: Secondary | ICD-10-CM

## 2023-05-13 DIAGNOSIS — G459 Transient cerebral ischemic attack, unspecified: Secondary | ICD-10-CM | POA: Diagnosis not present

## 2023-05-13 DIAGNOSIS — R351 Nocturia: Secondary | ICD-10-CM | POA: Diagnosis present

## 2023-05-13 DIAGNOSIS — M47812 Spondylosis without myelopathy or radiculopathy, cervical region: Secondary | ICD-10-CM | POA: Diagnosis not present

## 2023-05-13 DIAGNOSIS — S0003XA Contusion of scalp, initial encounter: Secondary | ICD-10-CM | POA: Diagnosis present

## 2023-05-13 DIAGNOSIS — Z9181 History of falling: Secondary | ICD-10-CM

## 2023-05-13 DIAGNOSIS — D509 Iron deficiency anemia, unspecified: Secondary | ICD-10-CM | POA: Diagnosis not present

## 2023-05-13 DIAGNOSIS — I639 Cerebral infarction, unspecified: Principal | ICD-10-CM | POA: Diagnosis present

## 2023-05-13 DIAGNOSIS — E785 Hyperlipidemia, unspecified: Secondary | ICD-10-CM | POA: Diagnosis present

## 2023-05-13 DIAGNOSIS — G9389 Other specified disorders of brain: Secondary | ICD-10-CM | POA: Diagnosis not present

## 2023-05-13 DIAGNOSIS — M4802 Spinal stenosis, cervical region: Secondary | ICD-10-CM | POA: Diagnosis not present

## 2023-05-13 DIAGNOSIS — I672 Cerebral atherosclerosis: Secondary | ICD-10-CM | POA: Diagnosis not present

## 2023-05-13 DIAGNOSIS — Z7982 Long term (current) use of aspirin: Secondary | ICD-10-CM

## 2023-05-13 DIAGNOSIS — Z66 Do not resuscitate: Secondary | ICD-10-CM | POA: Diagnosis not present

## 2023-05-13 DIAGNOSIS — R519 Headache, unspecified: Secondary | ICD-10-CM | POA: Diagnosis not present

## 2023-05-13 DIAGNOSIS — I6389 Other cerebral infarction: Secondary | ICD-10-CM | POA: Diagnosis not present

## 2023-05-13 DIAGNOSIS — W010XXA Fall on same level from slipping, tripping and stumbling without subsequent striking against object, initial encounter: Secondary | ICD-10-CM | POA: Diagnosis present

## 2023-05-13 DIAGNOSIS — Z7902 Long term (current) use of antithrombotics/antiplatelets: Secondary | ICD-10-CM | POA: Diagnosis not present

## 2023-05-13 DIAGNOSIS — R471 Dysarthria and anarthria: Secondary | ICD-10-CM | POA: Diagnosis present

## 2023-05-13 DIAGNOSIS — Z043 Encounter for examination and observation following other accident: Secondary | ICD-10-CM | POA: Diagnosis not present

## 2023-05-13 DIAGNOSIS — I10 Essential (primary) hypertension: Secondary | ICD-10-CM | POA: Diagnosis not present

## 2023-05-13 DIAGNOSIS — E876 Hypokalemia: Secondary | ICD-10-CM | POA: Diagnosis present

## 2023-05-13 DIAGNOSIS — R4702 Dysphasia: Secondary | ICD-10-CM | POA: Diagnosis not present

## 2023-05-13 DIAGNOSIS — R29717 NIHSS score 17: Secondary | ICD-10-CM | POA: Diagnosis not present

## 2023-05-13 DIAGNOSIS — I2510 Atherosclerotic heart disease of native coronary artery without angina pectoris: Secondary | ICD-10-CM | POA: Diagnosis present

## 2023-05-13 DIAGNOSIS — Z7401 Bed confinement status: Secondary | ICD-10-CM | POA: Diagnosis not present

## 2023-05-13 DIAGNOSIS — R918 Other nonspecific abnormal finding of lung field: Secondary | ICD-10-CM | POA: Diagnosis not present

## 2023-05-13 DIAGNOSIS — R0902 Hypoxemia: Secondary | ICD-10-CM | POA: Diagnosis not present

## 2023-05-13 DIAGNOSIS — M1712 Unilateral primary osteoarthritis, left knee: Secondary | ICD-10-CM | POA: Diagnosis not present

## 2023-05-13 DIAGNOSIS — I6782 Cerebral ischemia: Secondary | ICD-10-CM | POA: Diagnosis not present

## 2023-05-13 DIAGNOSIS — E66811 Obesity, class 1: Secondary | ICD-10-CM | POA: Diagnosis not present

## 2023-05-13 DIAGNOSIS — H3411 Central retinal artery occlusion, right eye: Secondary | ICD-10-CM | POA: Diagnosis present

## 2023-05-13 DIAGNOSIS — I63311 Cerebral infarction due to thrombosis of right middle cerebral artery: Secondary | ICD-10-CM | POA: Diagnosis not present

## 2023-05-13 DIAGNOSIS — K219 Gastro-esophageal reflux disease without esophagitis: Secondary | ICD-10-CM | POA: Diagnosis present

## 2023-05-13 DIAGNOSIS — E871 Hypo-osmolality and hyponatremia: Secondary | ICD-10-CM | POA: Diagnosis present

## 2023-05-13 DIAGNOSIS — I69320 Aphasia following cerebral infarction: Secondary | ICD-10-CM

## 2023-05-13 DIAGNOSIS — F32A Depression, unspecified: Secondary | ICD-10-CM | POA: Diagnosis not present

## 2023-05-13 DIAGNOSIS — Z6830 Body mass index (BMI) 30.0-30.9, adult: Secondary | ICD-10-CM | POA: Diagnosis not present

## 2023-05-13 DIAGNOSIS — M85862 Other specified disorders of bone density and structure, left lower leg: Secondary | ICD-10-CM | POA: Diagnosis not present

## 2023-05-13 DIAGNOSIS — K529 Noninfective gastroenteritis and colitis, unspecified: Secondary | ICD-10-CM | POA: Diagnosis not present

## 2023-05-13 DIAGNOSIS — Z79899 Other long term (current) drug therapy: Secondary | ICD-10-CM | POA: Diagnosis not present

## 2023-05-13 DIAGNOSIS — W19XXXA Unspecified fall, initial encounter: Secondary | ICD-10-CM | POA: Diagnosis not present

## 2023-05-13 DIAGNOSIS — Z8249 Family history of ischemic heart disease and other diseases of the circulatory system: Secondary | ICD-10-CM

## 2023-05-13 DIAGNOSIS — G8110 Spastic hemiplegia affecting unspecified side: Secondary | ICD-10-CM | POA: Diagnosis present

## 2023-05-13 DIAGNOSIS — E870 Hyperosmolality and hypernatremia: Secondary | ICD-10-CM | POA: Diagnosis not present

## 2023-05-13 DIAGNOSIS — I69398 Other sequelae of cerebral infarction: Secondary | ICD-10-CM | POA: Diagnosis not present

## 2023-05-13 DIAGNOSIS — Z833 Family history of diabetes mellitus: Secondary | ICD-10-CM

## 2023-05-13 DIAGNOSIS — I6523 Occlusion and stenosis of bilateral carotid arteries: Secondary | ICD-10-CM | POA: Diagnosis not present

## 2023-05-13 DIAGNOSIS — R0989 Other specified symptoms and signs involving the circulatory and respiratory systems: Secondary | ICD-10-CM | POA: Diagnosis not present

## 2023-05-13 DIAGNOSIS — I69351 Hemiplegia and hemiparesis following cerebral infarction affecting right dominant side: Secondary | ICD-10-CM | POA: Diagnosis not present

## 2023-05-13 DIAGNOSIS — Y92099 Unspecified place in other non-institutional residence as the place of occurrence of the external cause: Secondary | ICD-10-CM | POA: Diagnosis not present

## 2023-05-13 DIAGNOSIS — I6602 Occlusion and stenosis of left middle cerebral artery: Secondary | ICD-10-CM | POA: Diagnosis not present

## 2023-05-13 DIAGNOSIS — R531 Weakness: Secondary | ICD-10-CM | POA: Diagnosis not present

## 2023-05-13 DIAGNOSIS — Z751 Person awaiting admission to adequate facility elsewhere: Secondary | ICD-10-CM

## 2023-05-13 DIAGNOSIS — I6503 Occlusion and stenosis of bilateral vertebral arteries: Secondary | ICD-10-CM | POA: Diagnosis not present

## 2023-05-13 DIAGNOSIS — M25562 Pain in left knee: Secondary | ICD-10-CM | POA: Diagnosis not present

## 2023-05-13 LAB — CBC
HCT: 48.7 % — ABNORMAL HIGH (ref 36.0–46.0)
Hemoglobin: 14.8 g/dL (ref 12.0–15.0)
MCH: 26.3 pg (ref 26.0–34.0)
MCHC: 30.4 g/dL (ref 30.0–36.0)
MCV: 86.7 fL (ref 80.0–100.0)
Platelets: 346 10*3/uL (ref 150–400)
RBC: 5.62 MIL/uL — ABNORMAL HIGH (ref 3.87–5.11)
RDW: 14.7 % (ref 11.5–15.5)
WBC: 14.2 10*3/uL — ABNORMAL HIGH (ref 4.0–10.5)
nRBC: 0 % (ref 0.0–0.2)

## 2023-05-13 LAB — BASIC METABOLIC PANEL
Anion gap: 15 (ref 5–15)
BUN: 19 mg/dL (ref 8–23)
CO2: 27 mmol/L (ref 22–32)
Calcium: 9.3 mg/dL (ref 8.9–10.3)
Chloride: 104 mmol/L (ref 98–111)
Creatinine, Ser: 0.53 mg/dL (ref 0.44–1.00)
GFR, Estimated: >60 mL/min (ref >60)
Glucose, Bld: 143 mg/dL — ABNORMAL HIGH (ref 70–99)
Potassium: 3.1 mmol/L — ABNORMAL LOW (ref 3.5–5.1)
Sodium: 146 mmol/L — ABNORMAL HIGH (ref 135–145)

## 2023-05-13 MED ORDER — ATORVASTATIN CALCIUM 40 MG PO TABS
40.0000 mg | ORAL_TABLET | Freq: Every day | ORAL | Status: DC
  Administered 2023-05-14 – 2023-05-15 (×2): 40 mg via ORAL
  Filled 2023-05-13 (×2): qty 1

## 2023-05-13 MED ORDER — DICLOFENAC SODIUM 1 % EX GEL
2.0000 g | Freq: Four times a day (QID) | CUTANEOUS | Status: DC
Start: 2023-05-13 — End: 2023-05-16
  Administered 2023-05-16 (×2): 2 g via TOPICAL
  Filled 2023-05-13: qty 100

## 2023-05-13 MED ORDER — NITROGLYCERIN 0.4 MG SL SUBL: 0.4000 mg | SUBLINGUAL_TABLET | SUBLINGUAL | Status: DC | PRN

## 2023-05-13 MED ORDER — ACETAMINOPHEN 650 MG RE SUPP
650.0000 mg | RECTAL | Status: DC | PRN
  Administered 2023-05-14: 650 mg via RECTAL
  Filled 2023-05-13: qty 1

## 2023-05-13 MED ORDER — POLYETHYLENE GLYCOL 3350 17 G PO PACK
17.0000 g | PACK | Freq: Every day | ORAL | Status: DC
  Administered 2023-05-14 – 2023-05-16 (×3): 17 g via ORAL
  Filled 2023-05-13 (×3): qty 1

## 2023-05-13 MED ORDER — ESTRADIOL 0.1 MG/GM VA CREA
1.0000 | TOPICAL_CREAM | VAGINAL | Status: DC
  Administered 2023-05-14 – 2023-05-16 (×2): 1 via VAGINAL
  Filled 2023-05-13 (×2): qty 42.5

## 2023-05-13 MED ORDER — DESMOPRESSIN ACETATE 0.1 MG PO TABS
0.4000 mg | ORAL_TABLET | Freq: Every day | ORAL | Status: DC
  Filled 2023-05-13: qty 4

## 2023-05-13 MED ORDER — ASPIRIN 81 MG PO TBEC
162.0000 mg | DELAYED_RELEASE_TABLET | Freq: Every day | ORAL | Status: DC
  Administered 2023-05-14: 162 mg via ORAL
  Filled 2023-05-13 (×2): qty 2

## 2023-05-13 MED ORDER — POTASSIUM CHLORIDE 2 MEQ/ML IV SOLN: INTRAVENOUS | Status: DC

## 2023-05-13 MED ORDER — MIRABEGRON ER 25 MG PO TB24
50.0000 mg | ORAL_TABLET | Freq: Every day | ORAL | Status: DC
  Administered 2023-05-14 – 2023-05-16 (×3): 50 mg via ORAL
  Filled 2023-05-13 (×4): qty 2

## 2023-05-13 MED ORDER — VITAMIN D 25 MCG (1000 UNIT) PO TABS
2000.0000 [IU] | ORAL_TABLET | Freq: Every day | ORAL | Status: DC
Start: 2023-05-13 — End: 2023-05-16
  Administered 2023-05-14 – 2023-05-15 (×2): 2000 [IU] via ORAL
  Filled 2023-05-13 (×2): qty 2

## 2023-05-13 MED ORDER — DOCUSATE SODIUM 100 MG PO CAPS
100.0000 mg | ORAL_CAPSULE | Freq: Every day | ORAL | Status: DC
  Administered 2023-05-14 – 2023-05-16 (×3): 100 mg via ORAL
  Filled 2023-05-13 (×3): qty 1

## 2023-05-13 MED ORDER — GABAPENTIN 300 MG PO CAPS
300.0000 mg | ORAL_CAPSULE | Freq: Three times a day (TID) | ORAL | Status: DC
  Administered 2023-05-14 – 2023-05-16 (×8): 300 mg via ORAL
  Filled 2023-05-13 (×8): qty 1

## 2023-05-13 MED ORDER — SENNOSIDES-DOCUSATE SODIUM 8.6-50 MG PO TABS: 1.0000 | ORAL_TABLET | Freq: Every evening | ORAL | Status: DC | PRN

## 2023-05-13 MED ORDER — DULOXETINE HCL 20 MG PO CPEP
20.0000 mg | ORAL_CAPSULE | Freq: Every day | ORAL | Status: DC
  Administered 2023-05-14 – 2023-05-16 (×3): 20 mg via ORAL
  Filled 2023-05-13 (×3): qty 1

## 2023-05-13 MED ORDER — CLOPIDOGREL BISULFATE 75 MG PO TABS
75.0000 mg | ORAL_TABLET | Freq: Every day | ORAL | Status: DC
  Administered 2023-05-14 – 2023-05-16 (×3): 75 mg via ORAL
  Filled 2023-05-13 (×4): qty 1

## 2023-05-13 MED ORDER — HYDROCORTISONE (PERIANAL) 2.5 % EX CREA: 1.0000 | TOPICAL_CREAM | CUTANEOUS | Status: DC | PRN

## 2023-05-13 MED ORDER — STROKE: EARLY STAGES OF RECOVERY BOOK
Freq: Once | Status: AC
  Filled 2023-05-13: qty 1

## 2023-05-13 MED ORDER — ACETAMINOPHEN 325 MG PO TABS: 650.0000 mg | ORAL_TABLET | ORAL | Status: DC | PRN

## 2023-05-13 MED ORDER — KCL IN DEXTROSE-NACL 20-5-0.9 MEQ/L-%-% IV SOLN
INTRAVENOUS | Status: DC
  Filled 2023-05-13 (×2): qty 1000

## 2023-05-13 MED ORDER — PANTOPRAZOLE SODIUM 40 MG PO TBEC
40.0000 mg | DELAYED_RELEASE_TABLET | Freq: Every day | ORAL | Status: DC
Start: 2023-05-13 — End: 2023-05-16
  Administered 2023-05-14 – 2023-05-16 (×3): 40 mg via ORAL
  Filled 2023-05-13 (×3): qty 1

## 2023-05-13 MED ORDER — ACETAMINOPHEN 160 MG/5ML PO SOLN: 650.0000 mg | ORAL | Status: DC | PRN

## 2023-05-13 MED ORDER — IOHEXOL 350 MG/ML SOLN
75.0000 mL | Freq: Once | INTRAVENOUS | Status: AC | PRN
  Administered 2023-05-13: 75 mL via INTRAVENOUS

## 2023-05-13 MED ORDER — SODIUM CHLORIDE 0.9 % IV SOLN: INTRAVENOUS | Status: DC

## 2023-05-13 MED ORDER — ENOXAPARIN SODIUM 40 MG/0.4ML IJ SOSY
40.0000 mg | PREFILLED_SYRINGE | Freq: Every day | INTRAMUSCULAR | Status: DC
  Administered 2023-05-13 – 2023-05-16 (×4): 40 mg via SUBCUTANEOUS
  Filled 2023-05-13 (×4): qty 0.4

## 2023-05-13 NOTE — Progress Notes (Signed)
 This encounter was created in error - please disregard.

## 2023-05-13 NOTE — H&P (Addendum)
[facility] History and Physical  Kristin [name] ZHY:[id number] DOB: 1941/04/01 DOA: 05/13/2023  Referring physician: ED  PCP: Sharon Seller, NP   Patient is coming from: Independent living facility  Chief Complaint: Fall  HPI:  83 years old female with past medical history of CVA with right-sided hemiplegia, history of falls, history of residual aphasia, hypertension presented to hospital from [facility] independent living facility with complaints of fall which was unwitnessed.  Of note patient had presented to the ED, 7 to 8 days back with a fall at the time and had gone back to independent living facility from the ED.  Patient was again noted to have fall  which as per the patient was mostly mechanical without loss of consciousness.  Patient's family noted that she was out of her recliner on the floor around 7;30 this morning.  She was however communicative and at her baseline mentation and speech.  Patient is a poor historian due to dysarthria, aphasia.  She however complains of mild left sided pain from the fall and some bruise over the right scalp area.  No recent mention of nausea, vomiting, fever or chills.  No mention of shortness of breath, chest pain or dyspnea.  No mention of any urinary urgency, frequency or dysuria.  Patient's family is worried that she might not be able to take care of herself at the independent living facility.  Of note she used to be able to use a walker and ambulate and is currently at the independent living facility but for last 7 to 10 days she has not been able to do much.  In the ED, patient was afebrile but her blood pressure was slightly elevated at 150/96.  Initial labs showed WBC at 14.2 with sodium of 146 and potassium of 3.1.  Creatinine was 0.5.  CT head scan done in the ED showed possible new stroke in the right basal ganglia region possibly subacute.  MRI of the brain was then done which showed acute/subacute infarction in the right basal ganglia  and regional white matter tract.  Chest x-ray was notable for trace left effusion.  Left knee x-ray with severe osteoarthritis but no fracture.  CT cervical spine with findings of fusion from the past with degenerative changes but no fracture.  Neurology was consulted from the ED and was considered for admission to hospital at [facility].   Assessment and Plan Principal Problem:   CVA (cerebral vascular accident) (HCC) Active Problems:   Dyslipidemia, goal LDL below 70   Spastic hemiplegia affecting dominant side (HCC)   Aphasia as late effect of stroke   Essential hypertension   S/P CABG x 2   Hypokalemia  Acute on chronic CVA.  Residual right hemiplegia from previous stroke in 2001.  Will monitor in telemetry unit, stroke workup with MRI CT angiogram of the head and neck aspirin and statins.  Allow permissive hypertension for now.  Neurology has been notified for consultation.  Continue aspirin and Plavix.  Check a fasting lipid profile, TSH, A1c.  Likely patient will need placement.  Hypertension.  Will closely monitor blood pressure.  Will allow for permissive hypertension at this time.  Recent fall.   Mild right scalp hematoma.   Will obtain PT OT evaluation.  Likely patient will need placement.  Hyperlipidemia will continue with Lipitor.  Hypokalemia.  Mild.  Will replenish IV fluids..  Check levels in AM.  Unable to swallow so we will do IV potassium.  Mild hyponatremia.  Will  put the patient on D5 normal saline with KCl for now.  Check levels in AM.  Deconditioning, debility.  Currently at the independent living facility but has been declining.  Patient's family wishes for skilled nursing facility on discharge.  Will consult TOC.  DVT Prophylaxis: Lovenox subcu  Review of Systems:  All systems were reviewed and were negative unless otherwise mentioned in the HPI   Past Medical History:  Diagnosis Date   Arthritis    hands   CAD (coronary artery disease)    a. 2010:  stents pRCA and dRCA. b. LHC 12/13/13: Minimal distal LM dz. LAD 95% ostial w/ L-->R collat. LCx mild dz. RCA 95% mid-dRCA. Patent stents proximal and distal RCA. EF not assessed, Nl by nuclear study.      Central retinal vein occlusion, right eye    Chronic kidney disease    hx kidney stone   Depression    Diplopia    Ear pain    right   Eosinophilic esophagitis    Essential hypertension    GERD (gastroesophageal reflux disease)    H/O: whooping cough    Hyperlipidemia with target LDL less than 70    Incontinence    urine   Stroke (HCC) 2001   reesultant Rt spastic hemplegia and aphasia    Past Surgical History:  Procedure Laterality Date   ABDOMINAL HYSTERECTOMY     CAROTID STENT     cataracts     COLONOSCOPY N/A 10/05/2015   Procedure: COLONOSCOPY;  Surgeon: Ruffin Frederick, MD;  Location: [facility];  Service: Gastroenterology;  Laterality: N/A;   CORONARY ARTERY BYPASS GRAFT N/A 12/17/2013   Procedure: CORONARY ARTERY BYPASS GRAFTING (CABG);  Surgeon: Alleen Borne, MD;  Location: [facility] OR;  Service: Open Heart Surgery;  Laterality: N/A;  Times 2 using left internal mammary artery and endoscopically harvested right saphenous vein   ESOPHAGOGASTRODUODENOSCOPY     ESOPHAGOGASTRODUODENOSCOPY N/A 10/05/2015   Procedure: ESOPHAGOGASTRODUODENOSCOPY (EGD);  Surgeon: Ruffin Frederick, MD;  Location: [facility];  Service: Gastroenterology;  Laterality: N/A;   INTRAOPERATIVE TRANSESOPHAGEAL ECHOCARDIOGRAM N/A 12/17/2013   Procedure: INTRAOPERATIVE TRANSESOPHAGEAL ECHOCARDIOGRAM;  Surgeon: Alleen Borne, MD;  Location: [facility] OR;  Service: Open Heart Surgery;  Laterality: N/A;   lasix     LEFT HEART CATHETERIZATION WITH CORONARY ANGIOGRAM N/A 12/13/2013   Procedure: LEFT HEART CATHETERIZATION WITH CORONARY ANGIOGRAM;  Surgeon: Corky Crafts, MD;  Location: [facility] CATH LAB;  Service: Cardiovascular;  Laterality: N/A;   retina injection      Social History:  reports that she has  never smoked. She has never used smokeless tobacco. She reports current alcohol use. She reports that she does not use drugs.  No Known Allergies  Family History  Problem Relation Age of Onset   Diabetes Mother    Heart disease Mother    Heart attack Father    Breast cancer Sister      Prior to Admission medications   Medication Sig Start Date End Date Taking? Authorizing Provider  acetaminophen (TYLENOL) 500 MG tablet Take 1,000 mg by mouth in the morning, at noon, and at bedtime.    [provider]  aspirin 81 MG tablet Take 81 mg by mouth daily.    [provider]  atorvastatin (LIPITOR) 40 MG tablet tAKE ONE TABLET BY MOUTH DAILY. 03/04/23   Sharon Seller, NP  Cholecalciferol (VITAMIN D3) 50 MCG (2000 UT) capsule Take 2,000 Units by mouth at bedtime.    [provider]  clopidogrel (PLAVIX) 75 MG tablet TAKE ONE TABLET BY MOUTH DAILY 09/02/22   Sharon Seller, NP  Coenzyme Q10 (COQ-10 PO) Take 1 tablet daily by mouth.    [provider]  desmopressin (DDAVP) 0.2 MG tablet Take 0.4 mg by mouth at bedtime.    [provider]  diclofenac Sodium (VOLTAREN) 1 % GEL APPLY TO THE AFFECTED AREA(S) THREE TIMES DAILY. 02/13/21   Raulkar, Drema Pry, MD  docusate sodium (COLACE) 100 MG capsule Take 100 mg by mouth daily.    [provider]  DULoxetine (CYMBALTA) 20 MG capsule TAKE ONE CAPSULE BY MOUTH DAILY 12/04/22   Sharon Seller, NP  estradiol (ESTRACE) 0.1 MG/GM vaginal cream Place 1 Applicatorful vaginally 3 (three) times a week. 03/02/20   [provider]  ferrous sulfate 325 (65 FE) MG tablet Take 325 mg by mouth daily at 12 noon.    [provider]  gabapentin (NEURONTIN) 300 MG capsule TAKE ONE CAPSULE BY MOUTH THREE TIMES DAILY 12/04/22   Raulkar, Drema Pry, MD  hydrocortisone (ANUSOL-HC) 2.5 % rectal cream Place 1 Application rectally as needed for hemorrhoids. 08/12/22   Iva Boop, MD   methocarbamol (ROBAXIN) 500 MG tablet Take 1 tablet (500 mg total) by mouth every 8 (eight) hours as needed for muscle spasms. 03/30/21   Ngetich, Dinah C, NP  mirabegron ER (MYRBETRIQ) 50 MG TB24 tablet Take 50 mg by mouth daily. 08/25/20   [provider]  nitroGLYCERIN (NITROSTAT) 0.4 MG SL tablet Place 1 tablet (0.4 mg total) under the tongue every 5 (five) minutes as needed for chest pain. 10/30/21   Sharon Seller, NP  omeprazole (PRILOSEC) 20 MG capsule TAKE ONE CAPSULE BY MOUTH DAILY 12/04/22   Sharon Seller, NP  polyethylene glycol (MIRALAX / GLYCOLAX) 17 g packet Take 17 g by mouth daily. 05/24/22   Albertine Grates, MD  trimethoprim (TRIMPEX) 100 MG tablet Take 100 mg by mouth at bedtime. 04/29/22   [provider]  TURMERIC PO Take 1 tablet by mouth daily.     [provider]    Physical Exam:  Vitals:   05/13/23 1300 05/13/23 1330 05/13/23 1400 05/13/23 1415  BP: (!) 190/107 (!) 200/104 (!) 174/108 (!) 184/98  Pulse:  (!) 121 78 78  Resp:  20  18  Temp:  98.2 F (36.8 C)    TempSrc:  Oral    SpO2:  97% 95% 95%  Weight:      Height:       Wt Readings from Last 3 Encounters:  05/13/23 84 kg  05/07/23 84 kg  05/24/22 84.5 kg   Body mass index is 30.82 kg/m.  General:  Average built, not in obvious distress, elderly female, HENT: Normocephalic, No scleral pallor or icterus noted. Oral mucosa is moist.  Chest:  Clear breath sounds.  . No crackles or wheezes.  CVS: S1 &S2 heard. No murmur.  Regular rate and rhythm.  CABG scar noted. Abdomen: Soft, nontender, nondistended.  Bowel sounds are heard. No abdominal mass palpated Extremities: No cyanosis, clubbing or edema.  Right hemiaplegia noted.  Left upper extremity 3/ 5, left lower extremity 1/ 5 power but could be limited due to pain. Psych: Alert, awake and Communicative but has aphasia and dysarthria. CNS: Aphasia, dysarthria, right hemiplegia, Skin: Warm and dry.   Labs on Admission:    CBC: Recent Labs  Lab 05/07/23 1253 05/13/23 1009  WBC 8.6 14.2*  HGB 13.9 14.8  HCT 45.3 48.7*  MCV 87.1 86.7  PLT 279 346    Basic Metabolic Panel: Recent Labs  Lab 05/07/23 1253 05/13/23 1009  NA 139 146*  K 3.2* 3.1*  CL 102 104  CO2 26 27  GLUCOSE 113* 143*  BUN 17 19  CREATININE 0.51 0.53  CALCIUM 9.3 9.3    Liver Function Tests: No results for input(s): "AST", "ALT", "ALKPHOS", "BILITOT", "PROT", "ALBUMIN" in the last 168 hours. No results for input(s): "LIPASE", "AMYLASE" in the last 168 hours. No results for input(s): "AMMONIA" in the last 168 hours.  Cardiac Enzymes: No results for input(s): "CKTOTAL", "CKMB", "CKMBINDEX", "TROPONINI" in the last 168 hours.  BNP (last 3 results) No results for input(s): "BNP" in the last 8760 hours.  ProBNP (last 3 results) No results for input(s): "PROBNP" in the last 8760 hours.  CBG: No results for input(s): "GLUCAP" in the last 168 hours.  Lipase     Component Value Date/Time   LIPASE 17 07/07/2019 0124     Urinalysis    Component Value Date/Time   COLORURINE YELLOW 05/07/2023 1554   APPEARANCEUR HAZY (A) 05/07/2023 1554   LABSPEC 1.026 05/07/2023 1554   PHURINE 5.0 05/07/2023 1554   GLUCOSEU NEGATIVE 05/07/2023 1554   HGBUR MODERATE (A) 05/07/2023 1554   BILIRUBINUR NEGATIVE 05/07/2023 1554   KETONESUR 5 (A) 05/07/2023 1554   PROTEINUR 30 (A) 05/07/2023 1554   UROBILINOGEN 0.2 12/09/2013 1454   NITRITE NEGATIVE 05/07/2023 1554   LEUKOCYTESUR NEGATIVE 05/07/2023 1554     Drugs of Abuse  No results found for: "LABOPIA", "COCAINSCRNUR", "LABBENZ", "AMPHETMU", "THCU", "LABBARB"    Radiological Exams on Admission: MR BRAIN WO CONTRAST Result Date: 05/13/2023 CLINICAL DATA:  Neuro deficit, acute, stroke suspected. Unwitnessed fall and confusion. Speech disturbance. EXAM: MRI HEAD WITHOUT CONTRAST TECHNIQUE: Multiplanar, multiecho pulse sequences of the brain and surrounding structures were obtained  without intravenous contrast. COMPARISON:  Head CT earlier same day FINDINGS: Brain: Acute/subacute infarction in the right basal ganglia and regional white matter tracks. Mild swelling but no significant mass effect. No hemorrhage. No other acute infarction. Old left middle cerebral artery territory infarction with atrophy, encephalomalacia and gliosis. Ex vacuo enlargement of the left lateral ventricle. Chronic small-vessel ischemic changes of the white matter of both hemispheres. Chronic small-vessel ischemic changes of the pons. Wallerian degeneration of the brainstem on the left due to the old left MCA stroke. No hydrocephalus. No extra-axial collection. Vascular: Major vessels at the base of the brain show flow. Skull and upper cervical spine: Negative Sinuses/Orbits: Mild seasonal mucosal thickening. Small right mastoid effusion. Orbits negative. Other: None IMPRESSION: 1. Acute/subacute infarction in the right basal ganglia and regional white matter tracks. Mild swelling but no significant mass effect. No hemorrhage. 2. Old left middle cerebral artery territory infarction with atrophy, encephalomalacia and gliosis. Ex vacuo enlargement of the left lateral ventricle. Wallerian degeneration of the brainstem on the left due to the old left MCA stroke. 3. Chronic small-vessel ischemic changes of the white matter of both hemispheres and of the pons. Electronically Signed   By: Paulina Fusi M.D.   On: 05/13/2023 15:00   DG Chest Portable 1 View Result Date: 05/13/2023 CLINICAL DATA:  Fall. EXAM: PORTABLE CHEST 1 VIEW COMPARISON:  Chest radiograph dated May 07, 2023. FINDINGS: Stable cardiomediastinal silhouette. Prior median sternotomy and CABG. Low lung volumes. Trace left effusion versus pleural thickening. No focal consolidation or pneumothorax. No acute osseous abnormality identified. IMPRESSION: Trace left effusion versus pleural thickening,  similar to slightly less prominent compared to the prior  exam, which could be somewhat attributable to variations in positioning. Otherwise, no acute findings in the chest. Electronically Signed   By: Hart Robinsons M.D.   On: 05/13/2023 10:26   DG Knee 2 Views Left Result Date: 05/13/2023 CLINICAL DATA:  Fall and left knee pain. EXAM: LEFT KNEE - 1-2 VIEW COMPARISON:  None Available. FINDINGS: There is no acute fracture or dislocation. The bones are osteopenic. There is severe osteoarthritic changes with tricompartmental narrowing. There is near bone-on-bone contact in the lateral compartment. Small suprapatellar effusion. The soft tissues are unremarkable. Vascular calcifications noted. IMPRESSION: 1. No acute fracture or dislocation. 2. Severe osteoarthritis. Electronically Signed   By: Elgie Collard M.D.   On: 05/13/2023 10:23   CT Cervical Spine Wo Contrast Result Date: 05/13/2023 CLINICAL DATA:  Unwitnessed fall.  Found on the ground. EXAM: CT CERVICAL SPINE WITHOUT CONTRAST TECHNIQUE: Multidetector CT imaging of the cervical spine was performed without intravenous contrast. Multiplanar CT image reconstructions were also generated. RADIATION DOSE REDUCTION: This exam was performed according to the departmental dose-optimization program which includes automated exposure control, adjustment of the mA and/or kV according to patient size and/or use of iterative reconstruction technique. COMPARISON:  02/22/2021 FINDINGS: Alignment: No traumatic malalignment. Skull base and vertebrae: No regional fracture. Chronic fusion from C2 through C5 of the posterior elements. Soft tissues and spinal canal: No traumatic soft tissue finding. Disc levels: Chronic degenerative spondylosis at C5-6 and C6-7 with bilateral bony foraminal narrowing. Upper chest: Negative Other: None IMPRESSION: No acute or traumatic finding. Chronic fusion from C2 through C5 of the posterior elements. Chronic degenerative spondylosis at C5-6 and C6-7 with bilateral bony foraminal narrowing.  Electronically Signed   By: Paulina Fusi M.D.   On: 05/13/2023 10:23   CT Head Wo Contrast Result Date: 05/13/2023 CLINICAL DATA:  Headache, increasing frequency or severity. Unwitnessed fall. Found on the floor. EXAM: CT HEAD WITHOUT CONTRAST TECHNIQUE: Contiguous axial images were obtained from the base of the skull through the vertex without intravenous contrast. RADIATION DOSE REDUCTION: This exam was performed according to the departmental dose-optimization program which includes automated exposure control, adjustment of the mA and/or kV according to patient size and/or use of iterative reconstruction technique. COMPARISON:  02/22/2021 FINDINGS: Brain: Old left middle cerebral artery territory infarction with encephalomalacia and gliosis. New since November 2022 stroke in the right basal ganglia and radiating white matter tracts, possibly subacute. Chronic small-vessel ischemic changes otherwise affecting the white matter. No sign of mass, hemorrhage, hydrocephalus or extra-axial fluid collection Vascular: There is atherosclerotic calcification of the major vessels at the base of the brain. Skull: Negative Sinuses/Orbits: Mild mucosal inflammatory changes of the sinuses. No advanced finding. Orbits negative. Other: None IMPRESSION: New since November 2022 stroke in the right basal ganglia and radiating white matter tracts, possibly subacute. Old left middle cerebral artery territory infarction with encephalomalacia and gliosis. Chronic small-vessel ischemic changes otherwise affecting the white matter. Electronically Signed   By: Paulina Fusi M.D.   On: 05/13/2023 10:20    EKG: Not available for review.  Will order EKG stat.   Consultant: Neurology  Code Status: DNR  Microbiology none  Antibiotics: None  Family Communication:  Patients' condition and plan of care including tests being ordered have been discussed with the patient and the patient's stepdaughter at bedside who indicate  understanding and agree with the plan.   Status is: Inpatient  Severity of Illness: The appropriate patient  status for this patient is INPATIENT. Inpatient status is judged to be reasonable and necessary in order to provide the required intensity of service to ensure the patient's safety. The patient's presenting symptoms, physical exam findings, and initial radiographic and laboratory data in the context of their chronic comorbidities is felt to place them at high risk for further clinical deterioration. Furthermore, it is not anticipated that the patient will be medically stable for discharge from the hospital within 2 midnights of admission.   * I certify that at the point of admission it is my clinical judgment that the patient will require inpatient hospital care spanning beyond 2 midnights from the point of admission due to high intensity of service, high risk for further deterioration and high frequency of surveillance required.*  Signed, Joycelyn Das, MD [facility] 05/13/2023

## 2023-05-13 NOTE — Consult Note (Signed)
NEUROLOGY CONSULT NOTE   Date of service: May 13, 2023 Patient Name: Kristin [name] MRN:  [id number] DOB:  [birth year] Chief Complaint: "Stroke" Requesting Provider: Joycelyn Das, MD  History of Present Illness  Kristin [name] is a 83 y.o. female  has a past medical history of Arthritis, CAD (coronary artery disease), Central retinal vein occlusion, right eye, Chronic kidney disease, Depression, Diplopia, Ear pain, Eosinophilic esophagitis, Essential hypertension, GERD (gastroesophageal reflux disease), H/O: whooping cough, Hyperlipidemia with target LDL less than 70, Incontinence, and Stroke (HCC) (2001). who presents after a fall at an assisted living facility. At baseline she does have right-sided hemiplegia, dysarthria, aphasia from a previous stroke.  She was seen in the ED on 05/07/2023 for weakness.  Workup was negative and she was discharged home. For the last week they noted that she has been unable to transfer to her scooter.  They have felt that she is weaker on her left side.  She then had a fall today. At approximately 0730 this morning she was found on the floor next to her recliner.  She however complains of mild left sided pain from the fall and some bruise over the right scalp area. No recent mention of nausea, vomiting, fever or chills. No mention of shortness of breath, chest pain or dyspnea. No mention of any urinary urgency, frequency or dysuria.  At baseline she is typically able to transfer herself with a walker to her scooter.  She is hemiplegic from her previous stroke and she does have dysarthria and aphasia at baseline.  Home meds include aspirin 81 mg and Plavix 75 mg.  Rehab assessment in 2019 stated ongoing aphasia, expressive, occasionally able to convey thoughts through her words.  Uses a lot of yes and noes as well as facial expressions to communicate thoughts. Very alert. Follows simple commands. Has 2-3/5 strength right HF, 3 to 3+ out of 5 KE and trace distally at the  ankle.   ROS  Comprehensive ROS performed and pertinent positives documented in HPI   Past History   Past Medical History:  Diagnosis Date   Arthritis    hands   CAD (coronary artery disease)    a. 2010: stents pRCA and dRCA. b. LHC 12/13/13: Minimal distal LM dz. LAD 95% ostial w/ L-->R collat. LCx mild dz. RCA 95% mid-dRCA. Patent stents proximal and distal RCA. EF not assessed, Nl by nuclear study.      Central retinal vein occlusion, right eye    Chronic kidney disease    hx kidney stone   Depression    Diplopia    Ear pain    right   Eosinophilic esophagitis    Essential hypertension    GERD (gastroesophageal reflux disease)    H/O: whooping cough    Hyperlipidemia with target LDL less than 70    Incontinence    urine   Stroke (HCC) 2001   reesultant Rt spastic hemplegia and aphasia     Past Surgical History:  Procedure Laterality Date   ABDOMINAL HYSTERECTOMY     CAROTID STENT     cataracts     COLONOSCOPY N/A 10/05/2015   Procedure: COLONOSCOPY;  Surgeon: Ruffin Frederick, MD;  Location: [facility];  Service: Gastroenterology;  Laterality: N/A;   CORONARY ARTERY BYPASS GRAFT N/A 12/17/2013   Procedure: CORONARY ARTERY BYPASS GRAFTING (CABG);  Surgeon: Alleen Borne, MD;  Location: [facility] OR;  Service: Open Heart Surgery;  Laterality: N/A;  Times 2 using left internal mammary artery and endoscopically  harvested right saphenous vein   ESOPHAGOGASTRODUODENOSCOPY     ESOPHAGOGASTRODUODENOSCOPY N/A 10/05/2015   Procedure: ESOPHAGOGASTRODUODENOSCOPY (EGD);  Surgeon: Ruffin Frederick, MD;  Location: [facility];  Service: Gastroenterology;  Laterality: N/A;   INTRAOPERATIVE TRANSESOPHAGEAL ECHOCARDIOGRAM N/A 12/17/2013   Procedure: INTRAOPERATIVE TRANSESOPHAGEAL ECHOCARDIOGRAM;  Surgeon: Alleen Borne, MD;  Location: [facility] OR;  Service: Open Heart Surgery;  Laterality: N/A;   lasix     LEFT HEART CATHETERIZATION WITH CORONARY ANGIOGRAM N/A 12/13/2013   Procedure:  LEFT HEART CATHETERIZATION WITH CORONARY ANGIOGRAM;  Surgeon: Corky Crafts, MD;  Location: [facility] CATH LAB;  Service: Cardiovascular;  Laterality: N/A;   retina injection      Family History: Family History  Problem Relation Age of Onset   Diabetes Mother    Heart disease Mother    Heart attack Father    Breast cancer Sister     Social History  reports that she has never smoked. She has never used smokeless tobacco. She reports current alcohol use. She reports that she does not use drugs.  No Known Allergies  Medications   Current Facility-Administered Medications:    [START ON 05/14/2023]  stroke: early stages of recovery book, , Does not apply, Once, Pokhrel, [place], MD   acetaminophen (TYLENOL) tablet 650 mg, 650 mg, Oral, Q4H PRN **OR** acetaminophen (TYLENOL) 160 MG/5ML solution 650 mg, 650 mg, Per Tube, Q4H PRN **OR** acetaminophen (TYLENOL) suppository 650 mg, 650 mg, Rectal, Q4H PRN, Pokhrel, [place], MD   aspirin EC tablet 162 mg, 162 mg, Oral, Daily, Pokhrel, [place], MD   atorvastatin (LIPITOR) tablet 40 mg, 40 mg, Oral, Daily, Pokhrel, [place], MD   clopidogrel (PLAVIX) tablet 75 mg, 75 mg, Oral, Daily, Pokhrel, [place], MD   dextrose 5 % and 0.9% NaCl 1,000 mL with potassium chloride 30 mEq/L Pediatric IV infusion, , Intravenous, Continuous, Pokhrel, [place], MD   diclofenac Sodium (VOLTAREN) 1 % topical gel 2 g, 2 g, Topical, QID, Pokhrel, Lax[place], MD   docusate sodium (COLACE) capsule 100 mg, 100 mg, Oral, Daily, Pokhrel, Laxman, MD   DULoxetine (CYMBALTA) DR capsule 20 mg, 20 mg, Oral, Daily, Pokhrel, Laxman, MD   enoxaparin (LOVENOX) injection 40 mg, 40 mg, Subcutaneous, Daily, Pokhrel, Laxman, MD   [START ON 05/14/2023] estradiol (ESTRACE) vaginal cream 1 Applicatorful, 1 Applicatorful, Vaginal, Once per day on Monday Wednesday Friday, Pokhrel, [place], MD   gabapentin (NEURONTIN) capsule 300 mg, 300 mg, Oral, TID, Pokhrel, Laxman, MD   hydrocortisone (ANUSOL-HC) 2.5 %  rectal cream 1 Application, 1 Application, Rectal, PRN, Pokhrel, Lax[place], MD   mirabegron ER (MYRBETRIQ) tablet 50 mg, 50 mg, Oral, Daily, Pokhrel, [place], MD   nitroGLYCERIN (NITROSTAT) SL tablet 0.4 mg, 0.4 mg, Sublingual, Q5 min PRN, Pokhrel, Laxman, MD   pantoprazole (PROTONIX) EC tablet 40 mg, 40 mg, Oral, Daily, Pokhrel, Lax[place], MD   polyethylene glycol (MIRALAX / GLYCOLAX) packet 17 g, 17 g, Oral, Daily, Pokhrel, [place], MD   senna-docusate (Senokot-S) tablet 1 tablet, 1 tablet, Oral, QHS PRN, Pokhrel, L[place], MD   Vitamin D3 2,000 Units, 2,000 Units, Oral, QHS, Pokhrel, Laxman, MD  Current Outpatient Medications:    acetaminophen (TYLENOL) 500 MG tablet, Take 1,000 mg by mouth 3 (three) times daily as needed for mild pain (pain score 1-3), headache or fever., Disp: , Rfl:    aspirin 81 MG tablet, Take 81 mg by mouth in the morning., Disp: , Rfl:    atorvastatin (LIPITOR) 40 MG tablet, tAKE ONE TABLET BY MOUTH DAILY. (Patient taking differently: Take  40 mg by mouth every evening.), Disp: 90 tablet, Rfl: 3   Cholecalciferol (VITAMIN D3) 50 MCG (2000 UT) capsule, Take 2,000 Units by mouth at bedtime., Disp: , Rfl:    clopidogrel (PLAVIX) 75 MG tablet, TAKE ONE TABLET BY MOUTH DAILY (Patient taking differently: Take 75 mg by mouth in the morning.), Disp: 90 tablet, Rfl: 3   Coenzyme Q10 (COQ-10 PO), Take 1 tablet daily by mouth., Disp: , Rfl:    nitroGLYCERIN (NITROSTAT) 0.4 MG SL tablet, Place 1 tablet (0.4 mg total) under the tongue every 5 (five) minutes as needed for chest pain., Disp: 30 tablet, Rfl: 0   polyethylene glycol (MIRALAX / GLYCOLAX) 17 g packet, Take 17 g by mouth daily. (Patient taking differently: Take 17 g by mouth daily as needed for mild constipation.), Disp: 14 each, Rfl: 0   desmopressin (DDAVP) 0.2 MG tablet, Take 0.4 mg by mouth at bedtime., Disp: , Rfl:    diclofenac Sodium (VOLTAREN) 1 % GEL, APPLY TO THE AFFECTED AREA(S) THREE TIMES DAILY., Disp: 100 g, Rfl: 0    DULoxetine (CYMBALTA) 20 MG capsule, TAKE ONE CAPSULE BY MOUTH DAILY (Patient taking differently: Take 20 mg by mouth in the morning.), Disp: 90 capsule, Rfl: 3   estradiol (ESTRACE) 0.1 MG/GM vaginal cream, Place 1 Applicatorful vaginally 3 (three) times a week., Disp: , Rfl:    ferrous sulfate 325 (65 FE) MG tablet, Take 325 mg by mouth daily at 12 noon., Disp: , Rfl:    gabapentin (NEURONTIN) 300 MG capsule, TAKE ONE CAPSULE BY MOUTH THREE TIMES DAILY, Disp: 270 capsule, Rfl: 1   hydrocortisone (ANUSOL-HC) 2.5 % rectal cream, Place 1 Application rectally as needed for hemorrhoids., Disp: 28 g, Rfl: 3   methocarbamol (ROBAXIN) 500 MG tablet, Take 1 tablet (500 mg total) by mouth every 8 (eight) hours as needed for muscle spasms., Disp: 30 tablet, Rfl: 3   mirabegron ER (MYRBETRIQ) 50 MG TB24 tablet, Take 50 mg by mouth daily., Disp: , Rfl:    omeprazole (PRILOSEC) 20 MG capsule, TAKE ONE CAPSULE BY MOUTH DAILY, Disp: 90 capsule, Rfl: 3   trimethoprim (TRIMPEX) 100 MG tablet, Take 100 mg by mouth at bedtime., Disp: , Rfl:    TURMERIC PO, Take 1 tablet by mouth daily. , Disp: , Rfl:   Vitals   Vitals:   05/13/23 1300 05/13/23 1330 05/13/23 1400 05/13/23 1415  BP: (!) 190/107 (!) 200/104 (!) 174/108 (!) 184/98  Pulse:  (!) 121 78 78  Resp:  20  18  Temp:  98.2 F (36.8 C)    TempSrc:  Oral    SpO2:  97% 95% 95%  Weight:      Height:        Body mass index is 30.82 kg/m.  Physical Exam   Constitutional: chronically ill Caucasian Psych: Affect appropriate to situation.  Eyes: No scleral injection.  HENT: No OP obstruction.  Head: Normocephalic.  Cardiovascular: Normal rate and regular rhythm.  Respiratory: Effort normal, non-labored breathing.  GI: Soft.  No distension. There is no tenderness.  Skin: small area of ecchymosis on right scalp  Neurologic Examination   Neuro: Mental Status: Patient is awake, states yes/no. Follows commands well Cranial Nerves: II: Visual Fields  are full. Pupils are equal, round, and reactive to light.   III,IV, VI: EOMI without ptosis or diploplia.  V: Facial sensation is symmetric to temperature VII: Facial movement is symmetric resting and smiling VIII: Hearing is intact to voice X: Palate elevates  symmetrically XI: Shoulder shrug is symmetric. XII: Tongue protrudes midline without atrophy or fasciculations.  Motor: Tone is normal. Bulk is normal.  RUE  1/5 RH contracted  LUE 4/5  L hand grasp 3/5 RLE   1/5 Wiggles toes  LLE 2/5 - pain with movement, dorsiflexion and plantarflexion 2/5 Sensory: Grimaces to painful stimuli Cerebellar: FNF intact on the LUE   Labs/Imaging/Neurodiagnostic studies   CBC:  Recent Labs  Lab May 13, 2023 1253 05/13/23 1009  WBC 8.6 14.2*  HGB 13.9 14.8  HCT 45.3 48.7*  MCV 87.1 86.7  PLT 279 346    Basic Metabolic Panel:  Lab Results  Component Value Date   NA 146 (H) 05/13/2023   K 3.1 (L) 05/13/2023   CO2 27 05/13/2023   GLUCOSE 143 (H) 05/13/2023   BUN 19 05/13/2023   CREATININE 0.53 05/13/2023   CALCIUM 9.3 05/13/2023   GFRNONAA >60 05/13/2023   GFRAA 99 07/20/2019    Lipid Panel:  Lab Results  Component Value Date   LDLCALC 70 03/10/2023    HgbA1c:  Lab Results  Component Value Date   HGBA1C 5.4 06/02/2018    Urine Drug Screen: No results found for: "LABOPIA", "COCAINSCRNUR", "LABBENZ", "AMPHETMU", "THCU", "LABBARB"   Alcohol Level No results found for: "ETH"  INR  Lab Results  Component Value Date   INR 1.1 05/22/2022    APTT  Lab Results  Component Value Date   APTT 34 07/07/2019    AED levels: No results found for: "PHENYTOIN", "ZONISAMIDE", "LAMOTRIGINE", "LEVETIRACETA"    CT Head without contrast(Personally reviewed): New since November 2022 stroke in the right basal ganglia and radiating white matter tracts, possibly subacute. Old left middle cerebral artery territory infarction with encephalomalacia and gliosis. Chronic small-vessel ischemic  changes otherwise affecting the white matter.  CT angio Head and Neck with contrast(Personally reviewed): Pending   MRI Brain(Personally reviewed): Acute/subacute infarction in the right basal ganglia and regional white matter tracks. Mild swelling but no  significant mass effect. No hemorrhage. Old left middle cerebral artery territory infarction with atrophy, encephalomalacia and gliosis. Ex vacuo enlargement of the left lateral ventricle. Wallerian degeneration of the brainstem on the left due to the old left MCA stroke. Chronic small-vessel ischemic changes of the white matter of both hemispheres and of the pons.  ASSESSMENT   Kristin [name] is a 83 y.o. female  has a past medical history of Arthritis, CAD (coronary artery disease), Central retinal vein occlusion, right eye, Chronic kidney disease, Depression, Diplopia, Ear pain, Eosinophilic esophagitis, Essential hypertension, GERD (gastroesophageal reflux disease), H/O: whooping cough, Hyperlipidemia with target LDL less than 70, Incontinence, and Stroke (HCC) (2001). Acute/subacute infarct noted in the right basal ganglia on MRI. Plan to admit to [facility] for stroke work up.   RECOMMENDATIONS  - HgbA1c, fasting lipid panel - CT Angio Head and Neck - Frequent neuro checks - Echocardiogram - Carotid dopplers - Prophylactic therapy-Antiplatelet med: Aspirin 81mg  and Plavix 75mg  - Risk factor modification - Telemetry monitoring - PT consult, OT consult, Speech consult - Stroke team to follow  ______________________________________________________________________   Patient seen and examined by NP/APP with MD. MD to update note as needed.   Elmer Picker, DNP, FNP-BC [facility] Pager: [phone]

## 2023-05-13 NOTE — Progress Notes (Deleted)
Error. Patient was found on Floor and Waiting for EMS. Will Call back to Reschedule AWV.

## 2023-05-13 NOTE — ED Triage Notes (Signed)
Patient BIB [facility] from [facility]. Unwitnessed fall. Daughter found patient on the floor with her recliner on top of her. Has dysphagia, stutters while speaking. Right side deficit from previous stroke. Hematoma to right temple. Patient said she did not lose consciousness. No blood thinners.

## 2023-05-13 NOTE — ED Notes (Signed)
Patient's family member states she does not want the patient "coded" in the event something happens. There is a history of a DNR on file last year.

## 2023-05-13 NOTE — ED Notes (Signed)
Pt is here for an unwitnessed fall, brought in by EMS. Pt found by daughter. EMS reported for pt to be at her baseline at this time. Speech is normal for pt. Pt can answer yes or no questions.

## 2023-05-13 NOTE — ED Provider Notes (Signed)
EMERGENCY DEPARTMENT AT [facility] Provider Note   CSN: [id number] Arrival date & time: 05/13/23  6578     History  Chief Complaint  Patient presents with   Marletta Lor    Kristin [name] is a 83 y.o. female.  83 year old female with past medical history of CVA with right-sided deficits as well as aphasia presenting to the emergency department today after a fall at her assisted living facility.  The patient had an unwitnessed fall this morning.  She tells me that she tripped and fell when asked the answer no questions.  She is denying any syncope or presyncope/dizziness.  She is denying any acute complaints but obtaining history is somewhat difficult due to her aphasia.   Fall       Home Medications Prior to Admission medications   Medication Sig Start Date End Date Taking? Authorizing Provider  acetaminophen (TYLENOL) 500 MG tablet Take 1,000 mg by mouth 3 (three) times daily as needed for mild pain (pain score 1-3), headache or fever.   Yes [provider]  aspirin 81 MG tablet Take 81 mg by mouth in the morning.   Yes [provider]  atorvastatin (LIPITOR) 40 MG tablet tAKE ONE TABLET BY MOUTH DAILY. Patient taking differently: Take 40 mg by mouth every evening. 03/04/23  Yes Sharon Seller, NP  Cholecalciferol (VITAMIN D3) 50 MCG (2000 UT) capsule Take 2,000 Units by mouth at bedtime.   Yes [provider]  clopidogrel (PLAVIX) 75 MG tablet TAKE ONE TABLET BY MOUTH DAILY Patient taking differently: Take 75 mg by mouth in the morning. 09/02/22  Yes Sharon Seller, NP  Coenzyme Q10 (COQ-10 PO) Take 1 tablet daily by mouth.   Yes [provider]  desmopressin (DDAVP) 0.2 MG tablet Take 0.4 mg by mouth at bedtime.   Yes [provider]  DULoxetine (CYMBALTA) 20 MG capsule TAKE ONE CAPSULE BY MOUTH DAILY Patient taking differently: Take 20 mg by mouth in the morning. 12/04/22  Yes Sharon Seller, NP  estradiol  (ESTRACE) 0.1 MG/GM vaginal cream Place 1 Applicatorful vaginally 3 (three) times a week. 03/02/20  Yes [provider]  ferrous sulfate 325 (65 FE) MG tablet Take 325 mg by mouth See admin instructions. Take 325 mg by mouth WITH FOOD three times a week   Yes [provider]  gabapentin (NEURONTIN) 300 MG capsule TAKE ONE CAPSULE BY MOUTH THREE TIMES DAILY Patient taking differently: Take 300 mg by mouth in the morning, at noon, and at bedtime. 12/04/22  Yes Raulkar, Drema Pry, MD  methocarbamol (ROBAXIN) 500 MG tablet Take 1 tablet (500 mg total) by mouth every 8 (eight) hours as needed for muscle spasms. 03/30/21  Yes Ngetich, Dinah C, NP  nitroGLYCERIN (NITROSTAT) 0.4 MG SL tablet Place 1 tablet (0.4 mg total) under the tongue every 5 (five) minutes as needed for chest pain. 10/30/21  Yes Sharon Seller, NP  polyethylene glycol (MIRALAX / GLYCOLAX) 17 g packet Take 17 g by mouth daily. Patient taking differently: Take 17 g by mouth daily as needed for mild constipation. 05/24/22  Yes Albertine Grates, MD  trimethoprim (TRIMPEX) 100 MG tablet Take 100 mg by mouth every evening. 04/29/22  Yes [provider]  TURMERIC PO Take 1 capsule by mouth daily.   Yes [provider]  diclofenac Sodium (VOLTAREN) 1 % GEL APPLY TO THE AFFECTED AREA(S) THREE TIMES DAILY. Patient not taking: Reported on 05/13/2023 02/13/21   Horton Chin,  MD  hydrocortisone (ANUSOL-HC) 2.5 % rectal cream Place 1 Application rectally as needed for hemorrhoids. Patient not taking: Reported on 05/13/2023 08/12/22   Iva Boop, MD  mirabegron ER (MYRBETRIQ) 50 MG TB24 tablet Take 50 mg by mouth in the morning. 08/25/20   [provider]  omeprazole (PRILOSEC) 20 MG capsule TAKE ONE CAPSULE BY MOUTH DAILY 12/04/22   Sharon Seller, NP      Allergies    Patient has no known allergies.    Review of Systems   Review of Systems  Reason unable to perform ROS: Aphasia.    Physical  Exam Updated Vital Signs BP (!) 184/98 (BP Location: Right Arm)   Pulse 78   Temp 98.2 F (36.8 C) (Oral)   Resp 18   Ht 5\' 5"  (1.651 m)   Wt 84 kg   SpO2 95%   BMI 30.82 kg/m  Physical Exam Vitals and nursing note reviewed.   Gen: NAD Eyes: PERRL, EOMI HEENT: no oropharyngeal swelling Neck: trachea midline Resp: clear to auscultation bilaterally Card: RRR, no murmurs, rubs, or gallops Abd: nontender, nondistended, pelvis stable Extremities: no calf tenderness, no edema MSK: There is erythema noted over the right knee with no obvious deformity noted Vascular: 2+ radial pulses bilaterally, 2+ DP pulses bilaterally Neuro: Right-sided deficits noted Skin: no rashes Psyc: acting appropriately   ED Results / Procedures / Treatments   Labs (all labs ordered are listed, but only abnormal results are displayed) Labs Reviewed  CBC - Abnormal; Notable for the following components:      Result Value   WBC 14.2 (*)    RBC 5.62 (*)    HCT 48.7 (*)    All other components within normal limits  BASIC METABOLIC PANEL - Abnormal; Notable for the following components:   Sodium 146 (*)    Potassium 3.1 (*)    Glucose, Bld 143 (*)    All other components within normal limits  HEMOGLOBIN A1C    EKG None  Radiology MR BRAIN WO CONTRAST Result Date: 05/13/2023 CLINICAL DATA:  Neuro deficit, acute, stroke suspected. Unwitnessed fall and confusion. Speech disturbance. EXAM: MRI HEAD WITHOUT CONTRAST TECHNIQUE: Multiplanar, multiecho pulse sequences of the brain and surrounding structures were obtained without intravenous contrast. COMPARISON:  Head CT earlier same day FINDINGS: Brain: Acute/subacute infarction in the right basal ganglia and regional white matter tracks. Mild swelling but no significant mass effect. No hemorrhage. No other acute infarction. Old left middle cerebral artery territory infarction with atrophy, encephalomalacia and gliosis. Ex vacuo enlargement of the left  lateral ventricle. Chronic small-vessel ischemic changes of the white matter of both hemispheres. Chronic small-vessel ischemic changes of the pons. Wallerian degeneration of the brainstem on the left due to the old left MCA stroke. No hydrocephalus. No extra-axial collection. Vascular: Major vessels at the base of the brain show flow. Skull and upper cervical spine: Negative Sinuses/Orbits: Mild seasonal mucosal thickening. Small right mastoid effusion. Orbits negative. Other: None IMPRESSION: 1. Acute/subacute infarction in the right basal ganglia and regional white matter tracks. Mild swelling but no significant mass effect. No hemorrhage. 2. Old left middle cerebral artery territory infarction with atrophy, encephalomalacia and gliosis. Ex vacuo enlargement of the left lateral ventricle. Wallerian degeneration of the brainstem on the left due to the old left MCA stroke. 3. Chronic small-vessel ischemic changes of the white matter of both hemispheres and of the pons. Electronically Signed   By: Paulina Fusi M.D.   On: 05/13/2023  15:00   DG Chest Portable 1 View Result Date: 05/13/2023 CLINICAL DATA:  Fall. EXAM: PORTABLE CHEST 1 VIEW COMPARISON:  Chest radiograph dated May 07, 2023. FINDINGS: Stable cardiomediastinal silhouette. Prior median sternotomy and CABG. Low lung volumes. Trace left effusion versus pleural thickening. No focal consolidation or pneumothorax. No acute osseous abnormality identified. IMPRESSION: Trace left effusion versus pleural thickening, similar to slightly less prominent compared to the prior exam, which could be somewhat attributable to variations in positioning. Otherwise, no acute findings in the chest. Electronically Signed   By: Hart Robinsons M.D.   On: 05/13/2023 10:26   DG Knee 2 Views Left Result Date: 05/13/2023 CLINICAL DATA:  Fall and left knee pain. EXAM: LEFT KNEE - 1-2 VIEW COMPARISON:  None Available. FINDINGS: There is no acute fracture or dislocation. The  bones are osteopenic. There is severe osteoarthritic changes with tricompartmental narrowing. There is near bone-on-bone contact in the lateral compartment. Small suprapatellar effusion. The soft tissues are unremarkable. Vascular calcifications noted. IMPRESSION: 1. No acute fracture or dislocation. 2. Severe osteoarthritis. Electronically Signed   By: Elgie Collard M.D.   On: 05/13/2023 10:23   CT Cervical Spine Wo Contrast Result Date: 05/13/2023 CLINICAL DATA:  Unwitnessed fall.  Found on the ground. EXAM: CT CERVICAL SPINE WITHOUT CONTRAST TECHNIQUE: Multidetector CT imaging of the cervical spine was performed without intravenous contrast. Multiplanar CT image reconstructions were also generated. RADIATION DOSE REDUCTION: This exam was performed according to the departmental dose-optimization program which includes automated exposure control, adjustment of the mA and/or kV according to patient size and/or use of iterative reconstruction technique. COMPARISON:  02/22/2021 FINDINGS: Alignment: No traumatic malalignment. Skull base and vertebrae: No regional fracture. Chronic fusion from C2 through C5 of the posterior elements. Soft tissues and spinal canal: No traumatic soft tissue finding. Disc levels: Chronic degenerative spondylosis at C5-6 and C6-7 with bilateral bony foraminal narrowing. Upper chest: Negative Other: None IMPRESSION: No acute or traumatic finding. Chronic fusion from C2 through C5 of the posterior elements. Chronic degenerative spondylosis at C5-6 and C6-7 with bilateral bony foraminal narrowing. Electronically Signed   By: Paulina Fusi M.D.   On: 05/13/2023 10:23   CT Head Wo Contrast Result Date: 05/13/2023 CLINICAL DATA:  Headache, increasing frequency or severity. Unwitnessed fall. Found on the floor. EXAM: CT HEAD WITHOUT CONTRAST TECHNIQUE: Contiguous axial images were obtained from the base of the skull through the vertex without intravenous contrast. RADIATION DOSE REDUCTION:  This exam was performed according to the departmental dose-optimization program which includes automated exposure control, adjustment of the mA and/or kV according to patient size and/or use of iterative reconstruction technique. COMPARISON:  02/22/2021 FINDINGS: Brain: Old left middle cerebral artery territory infarction with encephalomalacia and gliosis. New since November 2022 stroke in the right basal ganglia and radiating white matter tracts, possibly subacute. Chronic small-vessel ischemic changes otherwise affecting the white matter. No sign of mass, hemorrhage, hydrocephalus or extra-axial fluid collection Vascular: There is atherosclerotic calcification of the major vessels at the base of the brain. Skull: Negative Sinuses/Orbits: Mild mucosal inflammatory changes of the sinuses. No advanced finding. Orbits negative. Other: None IMPRESSION: New since November 2022 stroke in the right basal ganglia and radiating white matter tracts, possibly subacute. Old left middle cerebral artery territory infarction with encephalomalacia and gliosis. Chronic small-vessel ischemic changes otherwise affecting the white matter. Electronically Signed   By: Paulina Fusi M.D.   On: 05/13/2023 10:20    Procedures Procedures    Medications Ordered in  ED Medications  aspirin EC tablet 162 mg (0 mg Oral Hold 05/13/23 1620)  atorvastatin (LIPITOR) tablet 40 mg (has no administration in time range)  nitroGLYCERIN (NITROSTAT) SL tablet 0.4 mg (has no administration in time range)  DULoxetine (CYMBALTA) DR capsule 20 mg (has no administration in time range)  pantoprazole (PROTONIX) EC tablet 40 mg (has no administration in time range)  docusate sodium (COLACE) capsule 100 mg (has no administration in time range)  polyethylene glycol (MIRALAX / GLYCOLAX) packet 17 g (has no administration in time range)  mirabegron ER (MYRBETRIQ) tablet 50 mg (has no administration in time range)  estradiol (ESTRACE) vaginal cream 1  Applicatorful (has no administration in time range)  clopidogrel (PLAVIX) tablet 75 mg (has no administration in time range)  gabapentin (NEURONTIN) capsule 300 mg (has no administration in time range)  Vitamin D3 2,000 Units (has no administration in time range)  diclofenac Sodium (VOLTAREN) 1 % topical gel 2 g (has no administration in time range)  hydrocortisone (ANUSOL-HC) 2.5 % rectal cream 1 Application (has no administration in time range)   stroke: early stages of recovery book (has no administration in time range)  acetaminophen (TYLENOL) tablet 650 mg (has no administration in time range)    Or  acetaminophen (TYLENOL) 160 MG/5ML solution 650 mg (has no administration in time range)    Or  acetaminophen (TYLENOL) suppository 650 mg (has no administration in time range)  senna-docusate (Senokot-S) tablet 1 tablet (has no administration in time range)  enoxaparin (LOVENOX) injection 40 mg (has no administration in time range)  dextrose 5 % and 0.9% NaCl 1,000 mL with potassium chloride 30 mEq/L Pediatric IV infusion (has no administration in time range)    ED Course/ Medical Decision Making/ A&P                                 Medical Decision Making 83 year old female with past medical history of CVA with right-sided deficits and severe aphasia presenting to the emergency department today after a fall at her assisted living facility.  I will further evaluate patient here with basic labs well as a CT scan of her head and cervical spine for evaluation for acute traumatic injuries p.o. obtain a chest x-ray and left knee x-ray to further evaluate for traumatic injuries.  I will discuss her case with her family and reevaluate for ultimate disposition.  The patient's labs are reassuring.  CT scan shows possible infarct.  MRI confirms this.  The patient is admitted to the hospital service for further stroke evaluation management.  Amount and/or Complexity of Data Reviewed Labs:  ordered. Radiology: ordered.  Risk Decision regarding hospitalization.           Final Clinical Impression(s) / ED Diagnoses Final diagnoses:  Cerebrovascular accident (CVA), unspecified mechanism (HCC)    Rx / DC Orders ED Discharge Orders     None         Durwin Glaze, MD 05/13/23 [phone]

## 2023-05-14 ENCOUNTER — Inpatient Hospital Stay (HOSPITAL_COMMUNITY): Payer: Medicare PPO

## 2023-05-14 DIAGNOSIS — I6389 Other cerebral infarction: Secondary | ICD-10-CM

## 2023-05-14 DIAGNOSIS — I63311 Cerebral infarction due to thrombosis of right middle cerebral artery: Secondary | ICD-10-CM

## 2023-05-14 LAB — CBC
HCT: 43.6 % (ref 36.0–46.0)
Hemoglobin: 13.5 g/dL (ref 12.0–15.0)
MCH: 26.6 pg (ref 26.0–34.0)
MCHC: 31 g/dL (ref 30.0–36.0)
MCV: 86 fL (ref 80.0–100.0)
Platelets: 328 10*3/uL (ref 150–400)
RBC: 5.07 MIL/uL (ref 3.87–5.11)
RDW: 14.8 % (ref 11.5–15.5)
WBC: 8.6 10*3/uL (ref 4.0–10.5)
nRBC: 0 % (ref 0.0–0.2)

## 2023-05-14 LAB — BASIC METABOLIC PANEL
Anion gap: 12 (ref 5–15)
Anion gap: 12 (ref 5–15)
BUN: 12 mg/dL (ref 8–23)
BUN: 15 mg/dL (ref 8–23)
CO2: 28 mmol/L (ref 22–32)
CO2: 23 mmol/L (ref 22–32)
Calcium: 8.6 mg/dL — ABNORMAL LOW (ref 8.9–10.3)
Calcium: 8.8 mg/dL — ABNORMAL LOW (ref 8.9–10.3)
Chloride: 107 mmol/L (ref 98–111)
Chloride: 110 mmol/L (ref 98–111)
Creatinine, Ser: 0.54 mg/dL (ref 0.44–1.00)
Creatinine, Ser: 0.77 mg/dL (ref 0.44–1.00)
GFR, Estimated: >60 mL/min (ref >60)
GFR, Estimated: >60 mL/min (ref >60)
Glucose, Bld: 156 mg/dL — ABNORMAL HIGH (ref 70–99)
Glucose, Bld: 195 mg/dL — ABNORMAL HIGH (ref 70–99)
Potassium: 2.9 mmol/L — ABNORMAL LOW (ref 3.5–5.1)
Potassium: 3.2 mmol/L — ABNORMAL LOW (ref 3.5–5.1)
Sodium: 147 mmol/L — ABNORMAL HIGH (ref 135–145)
Sodium: 145 mmol/L (ref 135–145)

## 2023-05-14 LAB — LIPID PANEL
Cholesterol: 133 mg/dL (ref 0–200)
HDL: 35 mg/dL — ABNORMAL LOW (ref >40)
LDL Cholesterol: 82 mg/dL (ref 0–99)
Total CHOL/HDL Ratio: 3.8 {ratio}
Triglycerides: 79 mg/dL (ref <150)
VLDL: 16 mg/dL (ref 0–40)

## 2023-05-14 LAB — ECHOCARDIOGRAM COMPLETE
AR max vel: 1.49 cm2
AV Area VTI: 1.68 cm2
AV Area mean vel: 1.51 cm2
AV Mean grad: 8.2 mm[Hg]
AV Peak grad: 14 mm[Hg]
Ao pk vel: 1.87 m/s
Area-P 1/2: 4.21 cm2
Height: 65 in
MV VTI: 2.07 cm2
S' Lateral: 3.1 cm
Weight: 2962.98 [oz_av]

## 2023-05-14 LAB — MAGNESIUM
Magnesium: 2 mg/dL (ref 1.7–2.4)
Magnesium: 1.9 mg/dL (ref 1.7–2.4)

## 2023-05-14 LAB — HEMOGLOBIN A1C
Hgb A1c MFr Bld: 5.8 % — ABNORMAL HIGH (ref 4.8–5.6)
Hgb A1c MFr Bld: 5.7 % — ABNORMAL HIGH (ref 4.8–5.6)
Mean Plasma Glucose: 119.76 mg/dL
Mean Plasma Glucose: 116.89 mg/dL

## 2023-05-14 MED ORDER — ENSURE ENLIVE PO LIQD
237.0000 mL | Freq: Two times a day (BID) | ORAL | Status: DC
  Administered 2023-05-14 – 2023-05-16 (×3): 237 mL via ORAL

## 2023-05-14 MED ORDER — POTASSIUM CHLORIDE 10 MEQ/100ML IV SOLN: 10.0000 meq | INTRAVENOUS | Status: DC

## 2023-05-14 MED ORDER — ASPIRIN 81 MG PO TBEC
81.0000 mg | DELAYED_RELEASE_TABLET | Freq: Every day | ORAL | Status: DC
Start: 2023-05-15 — End: 2023-05-16
  Administered 2023-05-15 – 2023-05-16 (×2): 81 mg via ORAL
  Filled 2023-05-14 (×2): qty 1

## 2023-05-14 MED ORDER — POTASSIUM CHLORIDE 2 MEQ/ML IV SOLN
INTRAVENOUS | Status: DC
  Filled 2023-05-14 (×2): qty 1000

## 2023-05-14 MED ORDER — PERFLUTREN LIPID MICROSPHERE
1.0000 mL | INTRAVENOUS | Status: AC | PRN
  Administered 2023-05-14: 2 mL via INTRAVENOUS

## 2023-05-14 MED ORDER — POTASSIUM CHLORIDE CRYS ER 20 MEQ PO TBCR
40.0000 meq | EXTENDED_RELEASE_TABLET | Freq: Once | ORAL | Status: AC
  Administered 2023-05-14: 40 meq via ORAL
  Filled 2023-05-14: qty 2

## 2023-05-14 NOTE — Progress Notes (Signed)
Physical Therapy Evaluation Patient Details Name: Kristin [name] MRN: [id number] DOB: 1941-02-28 Today's Date: 05/14/2023  History of Present Illness  Kristin [name] is a 83 year old female presented to hospital from [facility] independent living facility with complaints of fall which was unwitnessed. Kristin [name] complains of mild left sided pain from the fall and some bruise over the right scalp area. MRI on 05/13/2023 revealed Acute/subacute infarction in the right basal ganglia and regional white matter tracks and chronic small-vessel ischemic changes of the white matter of both  hemispheres and of the pons. PMHx of CVA with right-sided hemiplegia, history of falls, history of residual aphasia, hypertension, depression, Chronic kidney disease, GERD, Diplopia, CAD, arthritis, central retinal vein occlusion, right eye.  Clinical Impression  PTA pt living at [facility] where she slept in her recliner, was able to use RW to pivot to her scooter, and pivot from her scooter <>toilet. Pt independent with bird baths and dressing, facility provided meals in dining room but pt able to open snacks and drinks in her room. Pt had residual aphasia from prior CVA but was able to communicate her needs with limited number of words. Pt is currently limited in safe mobility by increased aphasia, increased R sided weakness and new onset of L sided weakness. PT recommending SNF level rehab at discharge. PT will continue to follow weekly to monitor for improvement and ability to participate in therapy.         If plan is discharge home, recommend the following: Two people to help with walking and/or transfers;Two people to help with bathing/dressing/bathroom;Assistance with cooking/housework;Assistance with feeding;Direct supervision/assist for medications management;Direct supervision/assist for financial management;Assist for transportation;Help with stairs or ramp for entrance;Supervision due to cognitive status   Can travel by  private vehicle   No    Equipment Recommendations None recommended by PT     Functional Status Assessment Patient has had a recent decline in their functional status and demonstrates the ability to make significant improvements in function in a reasonable and predictable amount of time.     Precautions / Restrictions Precautions Precautions: Fall Precaution Comments: fall within last  6 months Restrictions Weight Bearing Restrictions Per Provider Order: No      Mobility  Bed Mobility Overal bed mobility: Needs Assistance Bed Mobility: Supine to Sit, Sit to Supine     Supine to sit: HOB elevated, Total assist, +2 for physical assistance Sit to supine: Total assist, +2 for physical assistance   General bed mobility comments: totalAx2 with use of bed pad to come to EOB and return to supine    Transfers                   General transfer comment: unable to due to level of involvement       Modified Rankin (Stroke Patients Only) Modified Rankin (Stroke Patients Only) Pre-Morbid Rankin Score: Moderately severe disability Modified Rankin: Severe disability     Balance Overall balance assessment: Needs assistance Sitting-balance support: Feet supported, Bilateral upper extremity supported Sitting balance-Leahy Scale: Zero Sitting balance - Comments: able to maintain balance if UE/LE are positioned appropriately, 1x able to correct L lateral lean and then becomes to fatigued to maintain Postural control: Left lateral lean                                   Pertinent Vitals/Pain Pain Assessment Pain Assessment: Faces Faces Pain Scale: No  hurt    Home Living Family/patient expects to be discharged to:: Assisted living (at [facility], no lift facility)                 Home Equipment: Chiropractor (4 wheels);Shower seat;Toilet riser;Grab bars - toilet;Grab bars - tub/shower;Lift chair Additional Comments: daughter provided PLOF  and facility set up    Prior Function Prior Level of Function : Needs assist             Mobility Comments: sleeps in recliner, stand from lift recliner with RW to transfer to scooter, able to transfer from scooter to and  from toilet ADLs Comments: bathes at sink and dresses independently , medications refilled to organizer but able to take them, goes to cafeteria for meals, able to open snacks and drinks     Extremity/Trunk Assessment   Upper Extremity Assessment Upper Extremity Assessment: Defer to OT evaluation    Lower Extremity Assessment Lower Extremity Assessment: RLE deficits/detail;LLE deficits/detail;Difficult to assess due to impaired cognition RLE Deficits / Details: when asked to move R LE able to evert ankle, and able to perform trace hip abduction, PROM WFL, LLE Deficits / Details: PROM WFL trace hip abduction on command when L LE is placed able to utilize to maintain balance    Cervical / Trunk Assessment Cervical / Trunk Assessment: Other exceptions (neck) Cervical / Trunk Exceptions: contracted at neck with L neck rotation and forward flexion  Communication   Communication Communication: Difficulty communicating thoughts/reduced clarity of speech (at baseline expressive aphasia, grab bag of words able to express her needs to staff) Cueing Techniques: Verbal cues;Gestural cues;Tactile cues;Visual cues  Cognition Arousal: Alert Behavior During Therapy: WFL for tasks assessed/performed Overall Cognitive Status: Difficult to assess                                 General Comments: residual aphasia from prior CVA at baseline        General Comments General comments (skin integrity, edema, etc.): daughter present throughout sesion, provides information, pt repeatedly puts up three fingers on her L hand        Assessment/Plan    PT Assessment Patient needs continued PT services  PT Problem List Decreased strength;Decreased range of  motion;Decreased activity tolerance;Decreased balance;Decreased mobility;Decreased coordination;Decreased cognition       PT Treatment Interventions DME instruction;Functional mobility training;Therapeutic activities;Therapeutic exercise;Balance training;Neuromuscular re-education;Cognitive remediation;Patient/family education    PT Goals (Current goals can be found in the Care Plan section)  Acute Rehab PT Goals PT Goal Formulation: With family Time For Goal Achievement: 05/28/23 Potential to Achieve Goals: Poor    Frequency Min 1X/week        AM-PAC PT "6 Clicks" Mobility  Outcome Measure Help needed turning from your back to your side while in a flat bed without using bedrails?: Total Help needed moving from lying on your back to sitting on the side of a flat bed without using bedrails?: Total Help needed moving to and from a bed to a chair (including a wheelchair)?: Total Help needed standing up from a chair using your arms (e.g., wheelchair or bedside chair)?: Total Help needed to walk in hospital room?: Total Help needed climbing 3-5 steps with a railing? : Total 6 Click Score: 6    End of Session   Activity Tolerance: Patient tolerated treatment well Patient left: in bed;with call bell/phone within reach;with bed alarm set;with family/visitor present Nurse Communication:  Mobility status;Other (comment) (asked to order air matteress and prevalon boot due to lack of spontaneous movement) PT Visit Diagnosis: Hemiplegia and hemiparesis Hemiplegia - Right/Left: Right Hemiplegia - dominant/non-dominant: Dominant Hemiplegia - caused by: Cerebral infarction    Time: 1610-9604 PT Time Calculation (min) (ACUTE ONLY): 24 min   Charges:   PT Evaluation $PT Eval Moderate Complexity: 1 Mod   PT General Charges $$ ACUTE PT VISIT: 1 Visit         Kristin [name] B. Beverely Risen PT, DPT Acute Rehabilitation Services Please use secure chat or  Call Office [phone]   Elon Alas [facility] 05/14/2023, 11:42 AM

## 2023-05-14 NOTE — Progress Notes (Signed)
STROKE TEAM PROGRESS NOTE   SUBJECTIVE (INTERVAL HISTORY) Her daughter is at the bedside.  Overall her condition is stable.  Per patient daughter, patient had previous left MCA stroke with residual aphasia, dysarthria and right hemiplegia.  However, she was able to stand up with assist and walk with walker several steps.  She was able to transition herself from wheelchair to bed.  This time she was more confused, not able to understand conversation, much weaker on the right.   OBJECTIVE Temp:  [98.4 F (36.9 C)-99.6 F (37.6 C)] 98.4 F (36.9 C) (01/22 1522) Pulse Rate:  [88-107] 97 (01/22 1522) Cardiac Rhythm: Normal sinus rhythm (01/22 0900) Resp:  [17-22] 17 (01/22 1522) BP: (135-187)/(88-99) 151/89 (01/22 1522) SpO2:  [88 %-100 %] 94 % (01/22 1522)  No results for input(s): "GLUCAP" in the last 168 hours. Recent Labs  Lab 05/13/23 1009 05/14/23 0520  NA 146* 147*  K 3.1* 2.9*  CL 104 107  CO2 27 28  GLUCOSE 143* 156*  BUN 19 12  CREATININE 0.53 0.54  CALCIUM 9.3 8.6*  MG  --  2.0   No results for input(s): "AST", "ALT", "ALKPHOS", "BILITOT", "PROT", "ALBUMIN" in the last 168 hours. Recent Labs  Lab 05/13/23 1009 05/14/23 0520  WBC 14.2* 8.6  HGB 14.8 13.5  HCT 48.7* 43.6  MCV 86.7 86.0  PLT 346 328   No results for input(s): "CKTOTAL", "CKMB", "CKMBINDEX", "TROPONINI" in the last 168 hours. No results for input(s): "LABPROT", "INR" in the last 72 hours. No results for input(s): "COLORURINE", "LABSPEC", "PHURINE", "GLUCOSEU", "HGBUR", "BILIRUBINUR", "KETONESUR", "PROTEINUR", "UROBILINOGEN", "NITRITE", "LEUKOCYTESUR" in the last 72 hours.  Invalid input(s): "APPERANCEUR"     Component Value Date/Time   CHOL 133 05/14/2023 0520   TRIG 79 05/14/2023 0520   HDL 35 (L) 05/14/2023 0520   CHOLHDL 3.8 05/14/2023 0520   VLDL 16 05/14/2023 0520   LDLCALC 82 05/14/2023 0520   LDLCALC 70 03/10/2023 1335   Lab Results  Component Value Date   HGBA1C 5.7 (H) 05/14/2023    No results found for: "LABOPIA", "COCAINSCRNUR", "LABBENZ", "AMPHETMU", "THCU", "LABBARB"  No results for input(s): "ETH" in the last 168 hours.  I have personally reviewed the radiological images below and agree with the radiology interpretations.  ECHOCARDIOGRAM COMPLETE Result Date: 05/14/2023    ECHOCARDIOGRAM REPORT   Patient Name:   Kristin [name] Date of Exam: 05/14/2023 Medical Rec #:  [id number]  Height:       65.0 in Accession #:    [id number] Weight:       185.2 lb Date of Birth:  Sep 05, 1940   BSA:          1.914 m Patient Age:    83 years   BP:           156/92 mmHg Patient Gender: F          HR:           94 bpm. Exam Location:  Inpatient Procedure: 2D Echo, Cardiac Doppler, Color Doppler and Intracardiac            Opacification Agent Indications:    Stroke  History:        Patient has prior history of Echocardiogram examinations, most                 recent 07/07/2019. CAD, Stroke and PAD; Risk                 Factors:Hypertension.  Sonographer:  Amy Chionchio Referring Phys: [id number] [facility] [place] IMPRESSIONS  1. Left ventricular ejection fraction, by estimation, is 60 to 65%. The left ventricle has normal function. The left ventricle has no regional wall motion abnormalities. There is mild left ventricular hypertrophy. Left ventricular diastolic parameters are consistent with Grade I diastolic dysfunction (impaired relaxation).  2. Right ventricular systolic function is mildly reduced. The right ventricular size is normal.  3. The mitral valve is normal in structure. Trivial mitral valve regurgitation.  4. The aortic valve is tricuspid. Aortic valve regurgitation is trivial. Mild aortic valve stenosis. Vmax 2.5 m/s, MG 13 mmHg, DI 0.42 FINDINGS  Left Ventricle: Left ventricular ejection fraction, by estimation, is 60 to 65%. The left ventricle has normal function. The left ventricle has no regional wall motion abnormalities. The left ventricular internal cavity size was normal in size. There is   mild left ventricular hypertrophy. Left ventricular diastolic parameters are consistent with Grade I diastolic dysfunction (impaired relaxation). Right Ventricle: The right ventricular size is normal. No increase in right ventricular wall thickness. Right ventricular systolic function is mildly reduced. Left Atrium: Left atrial size was not well visualized. Right Atrium: Right atrial size was not well visualized. Pericardium: There is no evidence of pericardial effusion. Mitral Valve: The mitral valve is normal in structure. Trivial mitral valve regurgitation. MV peak gradient, 8.1 mmHg. The mean mitral valve gradient is 3.0 mmHg. Tricuspid Valve: The tricuspid valve is normal in structure. Tricuspid valve regurgitation is trivial. Aortic Valve: The aortic valve is tricuspid. Aortic valve regurgitation is trivial. Mild aortic stenosis is present. Aortic valve mean gradient measures 8.2 mmHg. Aortic valve peak gradient measures 14.0 mmHg. Aortic valve area, by VTI measures 1.68 cm. Pulmonic Valve: The pulmonic valve was not well visualized. Pulmonic valve regurgitation is not visualized. Aorta: The aortic root and ascending aorta are structurally normal, with no evidence of dilitation. IAS/Shunts: The interatrial septum was not well visualized.  LEFT VENTRICLE PLAX 2D LVIDd:         4.30 cm   Diastology LVIDs:         3.10 cm   LV e' medial:    3.70 cm/s LV PW:         1.10 cm   LV E/e' medial:  17.2 LV IVS:        0.90 cm   LV e' lateral:   5.11 cm/s LVOT diam:     2.00 cm   LV E/e' lateral: 12.4 LV SV:         44 LV SV Index:   23 LVOT Area:     3.14 cm  RIGHT VENTRICLE TAPSE (M-mode): 1.2 cm AORTIC VALVE                     PULMONIC VALVE AV Area (Vmax):    1.49 cm      PV Vmax:       0.95 m/s AV Area (Vmean):   1.51 cm      PV Peak grad:  3.6 mmHg AV Area (VTI):     1.68 cm AV Vmax:           187.20 cm/s AV Vmean:          128.420 cm/s AV VTI:            0.261 m AV Peak Grad:      14.0 mmHg AV Mean Grad:       8.2 mmHg LVOT Vmax:  88.65 cm/s LVOT Vmean:        61.850 cm/s LVOT VTI:          0.140 m LVOT/AV VTI ratio: 0.53  AORTA Ao Root diam: 3.00 cm Ao Asc diam:  3.70 cm MITRAL VALVE                TRICUSPID VALVE MV Area (PHT): 4.21 cm     TR Peak grad:   27.2 mmHg MV Area VTI:   2.07 cm     TR Vmax:        261.00 cm/s MV Peak grad:  8.1 mmHg MV Mean grad:  3.0 mmHg     SHUNTS MV Vmax:       1.42 m/s     Systemic VTI:  0.14 m MV Vmean:      76.3 cm/s    Systemic Diam: 2.00 cm MV Decel Time: 180 msec MV E velocity: 63.60 cm/s MV A velocity: 126.00 cm/s MV E/A ratio:  0.50 Epifanio Lesches MD Electronically signed by Epifanio Lesches MD Signature Date/Time: 05/14/2023/1:27:23 PM    Final    CT ANGIO HEAD NECK W WO CM Result Date: 05/13/2023 CLINICAL DATA:  Neuro deficit, acute, stroke suspected. Unwitnessed fall. Found on the floor. Speech disturbance. Acute right basal ganglia infarction by CT. EXAM: CT ANGIOGRAPHY HEAD AND NECK WITH AND WITHOUT CONTRAST TECHNIQUE: Multidetector CT imaging of the head and neck was performed using the standard protocol during bolus administration of intravenous contrast. Multiplanar CT image reconstructions and MIPs were obtained to evaluate the vascular anatomy. Carotid stenosis measurements (when applicable) are obtained utilizing NASCET criteria, using the distal internal carotid diameter as the denominator. RADIATION DOSE REDUCTION: This exam was performed according to the departmental dose-optimization program which includes automated exposure control, adjustment of the mA and/or kV according to patient size and/or use of iterative reconstruction technique. CONTRAST:  75mL OMNIPAQUE IOHEXOL 350 MG/ML SOLN COMPARISON:  CT and MRI same day FINDINGS: CTA NECK FINDINGS Aortic arch: Previous median sternotomy and CABG. Branching pattern is normal without origin stenosis. Right carotid system: Common carotid artery is tortuous but widely patent to the bifurcation.  Calcified plaque at the carotid bifurcation and ICA bulb. Minimal diameter of the ICA bulb is the same as the more distal cervical ICA, therefore no stenosis. Left carotid system: Common carotid artery is tortuous but widely patent to the bifurcation. Calcified plaque at the carotid bifurcation and ICA bulb. Minimal diameter in proximal bulb measures 1.8 mm. Compared to a more distal cervical ICA diameter of 4.5 mm, this indicates a 60% stenosis. Beyond that, the vessel is patent to the skull base. Vertebral arteries: No proximal subclavian stenosis. Calcified plaque adjacent to the right vertebral artery origin but no stenosis greater than 30%. No left vertebral artery origin stenosis. The vessels are then patent through the cervical region to the foramen magnum. Skeleton: Chronic cervical fusion C2 through C6. Chronic degenerative changes above and below that. Other neck: No mass or lymphadenopathy. Upper chest: Lung apices are clear. Review of the MIP images confirms the above findings CTA HEAD FINDINGS Anterior circulation: Both internal carotid arteries are patent through the skull base and siphon regions. There is siphon atherosclerotic calcification but no stenosis greater than 50% suspected. On the right, the anterior and middle cerebral vessels are patent. There is some atherosclerotic irregularity of the M1 segment on the right but no flow limiting stenosis. On the left, the anterior cerebral arteries patent. There is chronic occlusion of  the left middle cerebral artery. Posterior circulation: Both vertebral arteries are patent through the foramen magnum to the basilar artery. There is atherosclerotic disease of both vertebral artery V4 segments with stenosis estimated at 50% on both sides. The basilar artery shows some atherosclerotic irregularity but no flow limiting stenosis. Posterior circulation branch vessels show flow. Right PCA takes origin from the anterior circulation. There is moderate stenosis  of the proximal left P1 segment. Venous sinuses: Patent and normal. Anatomic variants: None significant. Review of the MIP images confirms the above findings IMPRESSION: 1. No acute vascular finding. Right M1 segment show some atherosclerotic irregularity but no flow limiting stenosis. 2. Atherosclerotic disease at both carotid bifurcations. 60% stenosis of the proximal left ICA bulb. No right ICA stenosis. 3. Atherosclerotic disease of both vertebral artery V4 segments with stenosis estimated at 50% on both sides. 4. Atherosclerotic disease of both carotid siphon regions with bilateral stenosis estimated at 50%. 5. 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 6. Moderate stenosis of the proximal left P1 segment. Electronically Signed   By: Paulina Fusi M.D.   On: 05/13/2023 19:06   MR BRAIN WO CONTRAST Result Date: 05/13/2023 CLINICAL DATA:  Neuro deficit, acute, stroke suspected. Unwitnessed fall and confusion. Speech disturbance. EXAM: MRI HEAD WITHOUT CONTRAST TECHNIQUE: Multiplanar, multiecho pulse sequences of the brain and surrounding structures were obtained without intravenous contrast. COMPARISON:  Head CT earlier same day FINDINGS: Brain: Acute/subacute infarction in the right basal ganglia and regional white matter tracks. Mild swelling but no significant mass effect. No hemorrhage. No other acute infarction. Old left middle cerebral artery territory infarction with atrophy, encephalomalacia and gliosis. Ex vacuo enlargement of the left lateral ventricle. Chronic small-vessel ischemic changes of the white matter of both hemispheres. Chronic small-vessel ischemic changes of the pons. Wallerian degeneration of the brainstem on the left due to the old left MCA stroke. No hydrocephalus. No extra-axial collection. Vascular: Major vessels at the base of the brain show flow. Skull and upper cervical spine: Negative Sinuses/Orbits: Mild seasonal mucosal thickening. Small right mastoid effusion.  Orbits negative. Other: None IMPRESSION: 1. Acute/subacute infarction in the right basal ganglia and regional white matter tracks. Mild swelling but no significant mass effect. No hemorrhage. 2. Old left middle cerebral artery territory infarction with atrophy, encephalomalacia and gliosis. Ex vacuo enlargement of the left lateral ventricle. Wallerian degeneration of the brainstem on the left due to the old left MCA stroke. 3. Chronic small-vessel ischemic changes of the white matter of both hemispheres and of the pons. Electronically Signed   By: Paulina Fusi M.D.   On: 05/13/2023 15:00   DG Chest Portable 1 View Result Date: 05/13/2023 CLINICAL DATA:  Fall. EXAM: PORTABLE CHEST 1 VIEW COMPARISON:  Chest radiograph dated May 07, 2023. FINDINGS: Stable cardiomediastinal silhouette. Prior median sternotomy and CABG. Low lung volumes. Trace left effusion versus pleural thickening. No focal consolidation or pneumothorax. No acute osseous abnormality identified. IMPRESSION: Trace left effusion versus pleural thickening, similar to slightly less prominent compared to the prior exam, which could be somewhat attributable to variations in positioning. Otherwise, no acute findings in the chest. Electronically Signed   By: Hart Robinsons M.D.   On: 05/13/2023 10:26   DG Knee 2 Views Left Result Date: 05/13/2023 CLINICAL DATA:  Fall and left knee pain. EXAM: LEFT KNEE - 1-2 VIEW COMPARISON:  None Available. FINDINGS: There is no acute fracture or dislocation. The bones are osteopenic. There is severe osteoarthritic changes with tricompartmental narrowing. There  is near bone-on-bone contact in the lateral compartment. Small suprapatellar effusion. The soft tissues are unremarkable. Vascular calcifications noted. IMPRESSION: 1. No acute fracture or dislocation. 2. Severe osteoarthritis. Electronically Signed   By: Elgie Collard M.D.   On: 05/13/2023 10:23   CT Cervical Spine Wo Contrast Result Date:  05/13/2023 CLINICAL DATA:  Unwitnessed fall.  Found on the ground. EXAM: CT CERVICAL SPINE WITHOUT CONTRAST TECHNIQUE: Multidetector CT imaging of the cervical spine was performed without intravenous contrast. Multiplanar CT image reconstructions were also generated. RADIATION DOSE REDUCTION: This exam was performed according to the departmental dose-optimization program which includes automated exposure control, adjustment of the mA and/or kV according to patient size and/or use of iterative reconstruction technique. COMPARISON:  02/22/2021 FINDINGS: Alignment: No traumatic malalignment. Skull base and vertebrae: No regional fracture. Chronic fusion from C2 through C5 of the posterior elements. Soft tissues and spinal canal: No traumatic soft tissue finding. Disc levels: Chronic degenerative spondylosis at C5-6 and C6-7 with bilateral bony foraminal narrowing. Upper chest: Negative Other: None IMPRESSION: No acute or traumatic finding. Chronic fusion from C2 through C5 of the posterior elements. Chronic degenerative spondylosis at C5-6 and C6-7 with bilateral bony foraminal narrowing. Electronically Signed   By: Paulina Fusi M.D.   On: 05/13/2023 10:23   CT Head Wo Contrast Result Date: 05/13/2023 CLINICAL DATA:  Headache, increasing frequency or severity. Unwitnessed fall. Found on the floor. EXAM: CT HEAD WITHOUT CONTRAST TECHNIQUE: Contiguous axial images were obtained from the base of the skull through the vertex without intravenous contrast. RADIATION DOSE REDUCTION: This exam was performed according to the departmental dose-optimization program which includes automated exposure control, adjustment of the mA and/or kV according to patient size and/or use of iterative reconstruction technique. COMPARISON:  02/22/2021 FINDINGS: Brain: Old left middle cerebral artery territory infarction with encephalomalacia and gliosis. New since November 2022 stroke in the right basal ganglia and radiating white matter  tracts, possibly subacute. Chronic small-vessel ischemic changes otherwise affecting the white matter. No sign of mass, hemorrhage, hydrocephalus or extra-axial fluid collection Vascular: There is atherosclerotic calcification of the major vessels at the base of the brain. Skull: Negative Sinuses/Orbits: Mild mucosal inflammatory changes of the sinuses. No advanced finding. Orbits negative. Other: None IMPRESSION: New since November 2022 stroke in the right basal ganglia and radiating white matter tracts, possibly subacute. Old left middle cerebral artery territory infarction with encephalomalacia and gliosis. Chronic small-vessel ischemic changes otherwise affecting the white matter. Electronically Signed   By: Paulina Fusi M.D.   On: 05/13/2023 10:20   DG Chest Port 1 View Result Date: 05/07/2023 CLINICAL DATA:  Weakness EXAM: PORTABLE CHEST 1 VIEW COMPARISON:  X-ray 07/07/2019 FINDINGS: Kyphotic rotated x-ray obscures the right apex. Status post median sternotomy. Normal cardiopericardial silhouette. Tortuous ectatic aorta. Slight blunting of the left costophrenic angle. Tiny effusion versus pleural thickening. There is some basilar atelectasis. No pneumothorax or edema. Curvature and degenerative changes of the spine. Osteopenia IMPRESSION: Postop chest. Basilar atelectasis or scar. Tiny left pleural effusion or thickening. Rotated kyphotic x-ray obscures the right lung apex. Additional imaging as clinically appropriate Electronically Signed   By: Karen Kays M.D.   On: 05/07/2023 14:44     PHYSICAL EXAM  Temp:  [98.4 F (36.9 C)-99.6 F (37.6 C)] 98.4 F (36.9 C) (01/22 1522) Pulse Rate:  [88-107] 97 (01/22 1522) Resp:  [17-22] 17 (01/22 1522) BP: (135-187)/(88-99) 151/89 (01/22 1522) SpO2:  [88 %-100 %] 94 % (01/22 1522)  General - Well  nourished, well developed, in no apparent distress.  Ophthalmologic - fundi not visualized due to noncooperation.  Cardiovascular - Regular rhythm and  rate.  Neuro - awake, alert, eyes open, global aphasia but able to follow 2-3 midline commands but not peripheral commands. No gaze palsy, tracking bilaterally, blinking to visual threat bilaterally.  Mild right facial droop. Tongue midline.  Right upper extremity spastic plegia, RLE slight withdraw to pain. LUE spontaneous movement against gravity, LLE withdraws to pain stronger than the right. Sensation, coordination and gait not tested.   ASSESSMENT/PLAN [name] is a 83 y.o. female with history of left MCA stroke with residual aphasia and right-sided hemiplegia, CAD, hypertension, hyperlipidemia, right CRAO admitted for worsening right-sided weakness and aphasia with confusion. No TNK given due to outside window.    Stroke:  right caudate head infarct like secondary to small vessel disease source CT new right BG/CR subacute infarct CT head and neck right M1 irregularity, left MCA chronic occlusion, bilateral ICA bulb atherosclerosis with left ICA 60% stenosis, bilateral ICA siphon 50% stenosis, bilateral V4 50% stenosis, moderate left P1 stenosis MRI right caudate head and CR infarct 2D Echo EF 60 to 65% LDL 82 HgbA1c 5.7 Lovenox for VTE prophylaxis aspirin 81 mg daily and clopidogrel 75 mg daily prior to admission, now on home aspirin 81 mg daily and clopidogrel 75 mg daily.  Ongoing aggressive stroke risk factor management Therapy recommendations: SNF Disposition: Pending  Hypertension Stable Gradually normalize BP in 2 to 3 days Long term BP goal normotensive  Hyperlipidemia Home meds: Lipitor 40 LDL 82, goal < 70 Now on Lipitor 40 No high intake statin given advanced age and LDL not far from goal Continue statin at discharge  DDAVP use Pt has DDAVP listed in the medication list likely for nocturia  However, DDAVP may be facilitate platelet function and clotting Discussed with Dr Allena Katz, will discontinue at this time  Other Stroke Risk Factors Advanced age Obesity,  Body mass index is 30.82 kg/m.  Hx stroke/TIA - hx of left MCA stroke with residual aphasia, dysarthria and right hemiplegia.   Coronary artery disease  Other Active Problems R CRAO  Hospital day # 1  Neurology will sign off. Please call with questions. Pt will follow up with stroke clinic NP at [facility] in about 4 weeks. Thanks for the consult.   Marvel Plan, MD PhD Stroke Neurology 05/14/2023 5:14 PM    To contact Stroke Continuity provider, please refer to [email or web address]. After hours, contact General Neurology

## 2023-05-14 NOTE — Plan of Care (Signed)
  Problem: Nutrition: Goal: Risk of aspiration will decrease Outcome: Not Progressing  PATIENT IS CURRENTLY NPO

## 2023-05-14 NOTE — [place] FL2 (Addendum)
[place] MEDICAID FL2 LEVEL OF CARE FORM     IDENTIFICATION  Patient Name: Kristin [name] Birthdate: October 06, 1940 Sex: female Admission Date (Current Location): 05/13/2023  [facility] and [place] Number:  Chiropodist and Address:  [facility], [address], [place], [place] [address]      Provider Number: [id number]  Attending Physician Name and Address:  Rolly Salter, MD  Relative Name and Phone Number:       Current Level of Care: SNF Recommended Level of Care: Skilled Nursing Facility Prior Approval Number:    Date Approved/Denied:   PASRR Number: [id number]  Discharge Plan: SNF    Current Diagnoses: Patient Active Problem List   Diagnosis Date Noted   CVA (cerebral vascular accident) (HCC) 05/13/2023   Diverticulosis of colon with hemorrhage 05/24/2022   Acute lower GI bleeding 05/23/2022   Acute blood loss anemia 05/23/2022   PAD (peripheral artery disease) (HCC) 08/31/2021   Recurrent UTI 08/31/2021   OAB (overactive bladder) 02/29/2020   Fever 07/07/2019   Nephrolithiasis 02/24/2017   Colitis 02/24/2017   Hypokalemia 02/24/2017   AKI (acute kidney injury) (HCC) 02/24/2017   History of stroke 02/04/2017   Female stress incontinence 01/09/2017   Ureteral calculus, right 01/09/2017   Nocturia 12/26/2016   Left ear pain 11/07/2016   Chest pain syndrome    Chest pain 10/17/2015   IDA (iron deficiency anemia) 10/04/2015   Right rotator cuff tendinitis 08/14/2015   Shoulder subluxation, right 07/06/2014   S/P CABG x 2 12/17/2013   Essential hypertension    Unstable angina (HCC) 12/09/2013   CAD (coronary artery disease) 12/09/2013   Dyslipidemia, goal LDL below 70 12/09/2013   Prolonged QT interval 12/09/2013   Unilateral primary osteoarthritis, right knee 06/01/2012   Lumbago 06/01/2012   Spastic hemiplegia affecting dominant side (HCC) 06/01/2012   Aphasia as late effect of stroke 06/01/2012    Orientation RESPIRATION  BLADDER Height & Weight     Self  Normal Incontinent Weight: 185 lb 3 oz (84 kg) Height:  5\' 5"  (165.1 cm)  BEHAVIORAL SYMPTOMS/MOOD NEUROLOGICAL BOWEL NUTRITION STATUS      Incontinent Diet (Regular)  AMBULATORY STATUS COMMUNICATION OF NEEDS Skin   Extensive Assist Verbally Normal                       Personal Care Assistance Level of Assistance  Bathing, Feeding, Dressing Bathing Assistance: Maximum assistance Feeding assistance: Maximum assistance       Functional Limitations Info             SPECIAL CARE FACTORS FREQUENCY  PT (By licensed PT), OT (By licensed OT)     PT Frequency: 5x/week OT Frequency: 5x/week            Contractures      Additional Factors Info  Code Status, Allergies Code Status Info: DNR Allergies Info: NKA           Current Medications (05/14/2023):  This is the current hospital active medication list Current Facility-Administered Medications  Medication Dose Route Frequency Provider Last Rate Last Admin   acetaminophen (TYLENOL) tablet 650 mg  650 mg Oral Q4H PRN Pokhrel, Laxman, MD       Or   acetaminophen (TYLENOL) 160 MG/5ML solution 650 mg  650 mg Per Tube Q4H PRN Pokhrel, Laxman, MD       Or   acetaminophen (TYLENOL) suppository 650 mg  650 mg Rectal Q4H PRN Pokhrel,  Laxman, MD   650 mg at 05/14/23 0059   aspirin EC tablet 162 mg  162 mg Oral Daily Pokhrel, Laxman, MD   162 mg at 05/14/23 5366   atorvastatin (LIPITOR) tablet 40 mg  40 mg Oral q1800 Pokhrel, Laxman, MD       cholecalciferol (VITAMIN D3) 25 MCG (1000 UNIT) tablet 2,000 Units  2,000 Units Oral QHS Pokhrel, Laxman, MD       clopidogrel (PLAVIX) tablet 75 mg  75 mg Oral Daily Pokhrel, Laxman, MD   75 mg at 05/14/23 4403   diclofenac Sodium (VOLTAREN) 1 % topical gel 2 g  2 g Topical QID Pokhrel, Laxman, MD       docusate sodium (COLACE) capsule 100 mg  100 mg Oral Daily Pokhrel, Laxman, MD   100 mg at 05/14/23 0935   DULoxetine (CYMBALTA) DR capsule 20 mg  20  mg Oral Daily Pokhrel, Laxman, MD   20 mg at 05/14/23 0937   enoxaparin (LOVENOX) injection 40 mg  40 mg Subcutaneous Daily Pokhrel, Laxman, MD   40 mg at 05/14/23 4742   estradiol (ESTRACE) vaginal cream 1 Applicatorful  1 Applicatorful Vaginal Once per day on Monday Wednesday Friday Joycelyn Das, MD   1 Applicatorful at 05/14/23 1236   gabapentin (NEURONTIN) capsule 300 mg  300 mg Oral TID Pokhrel, Laxman, MD   300 mg at 05/14/23 0935   hydrocortisone (ANUSOL-HC) 2.5 % rectal cream 1 Application  1 Application Rectal PRN Pokhrel, Laxman, MD       mirabegron ER (MYRBETRIQ) tablet 50 mg  50 mg Oral Daily Pokhrel, Laxman, MD   50 mg at 05/14/23 0936   nitroGLYCERIN (NITROSTAT) SL tablet 0.4 mg  0.4 mg Sublingual Q5 min PRN Pokhrel, Laxman, MD       pantoprazole (PROTONIX) EC tablet 40 mg  40 mg Oral Daily Pokhrel, Laxman, MD   40 mg at 05/14/23 0935   perflutren lipid microspheres (DEFINITY) IV suspension  1-10 mL Intravenous PRN Abbey Chatters K, NP   2 mL at 05/14/23 1210   polyethylene glycol (MIRALAX / GLYCOLAX) packet 17 g  17 g Oral Daily Pokhrel, Laxman, MD   17 g at 05/14/23 5956   senna-docusate (Senokot-S) tablet 1 tablet  1 tablet Oral QHS PRN Pokhrel, Rebekah Chesterfield, MD         Discharge Medications: Please see discharge summary for a list of discharge medications.  Relevant Imaging Results:  Relevant Lab Results:   Additional Information SSN: [id number]  Antion Felipa Emory, Student-Social Work

## 2023-05-14 NOTE — Evaluation (Signed)
Clinical/Bedside Swallow Evaluation Patient Details  Name: Kristin [name] MRN: [id number] Date of Birth: 25-Jul-1940  Today's Date: 05/14/2023 Time: SLP Start Time (ACUTE ONLY): 0815 SLP Stop Time (ACUTE ONLY): 0829 SLP Time Calculation (min) (ACUTE ONLY): 14 min  Past Medical History:  Past Medical History:  Diagnosis Date   Arthritis    hands   CAD (coronary artery disease)    a. 2010: stents pRCA and dRCA. b. LHC 12/13/13: Minimal distal LM dz. LAD 95% ostial w/ L-->R collat. LCx mild dz. RCA 95% mid-dRCA. Patent stents proximal and distal RCA. EF not assessed, Nl by nuclear study.      Central retinal vein occlusion, right eye    Chronic kidney disease    hx kidney stone   Depression    Diplopia    Ear pain    right   Eosinophilic esophagitis    Essential hypertension    GERD (gastroesophageal reflux disease)    H/O: whooping cough    Hyperlipidemia with target LDL less than 70    Incontinence    urine   Stroke (HCC) 2001   reesultant Rt spastic hemplegia and aphasia    Past Surgical History:  Past Surgical History:  Procedure Laterality Date   ABDOMINAL HYSTERECTOMY     CAROTID STENT     cataracts     COLONOSCOPY N/A 10/05/2015   Procedure: COLONOSCOPY;  Surgeon: Ruffin Frederick, MD;  Location: [facility];  Service: Gastroenterology;  Laterality: N/A;   CORONARY ARTERY BYPASS GRAFT N/A 12/17/2013   Procedure: CORONARY ARTERY BYPASS GRAFTING (CABG);  Surgeon: Alleen Borne, MD;  Location: [facility] OR;  Service: Open Heart Surgery;  Laterality: N/A;  Times 2 using left internal mammary artery and endoscopically harvested right saphenous vein   ESOPHAGOGASTRODUODENOSCOPY     ESOPHAGOGASTRODUODENOSCOPY N/A 10/05/2015   Procedure: ESOPHAGOGASTRODUODENOSCOPY (EGD);  Surgeon: Ruffin Frederick, MD;  Location: [facility];  Service: Gastroenterology;  Laterality: N/A;   INTRAOPERATIVE TRANSESOPHAGEAL ECHOCARDIOGRAM N/A 12/17/2013   Procedure: INTRAOPERATIVE TRANSESOPHAGEAL  ECHOCARDIOGRAM;  Surgeon: Alleen Borne, MD;  Location: [facility] OR;  Service: Open Heart Surgery;  Laterality: N/A;   lasix     LEFT HEART CATHETERIZATION WITH CORONARY ANGIOGRAM N/A 12/13/2013   Procedure: LEFT HEART CATHETERIZATION WITH CORONARY ANGIOGRAM;  Surgeon: Corky Crafts, MD;  [facility] C[facility] LAB;  Service: Cardiovascular;  Laterality: N/A;   retina injection     HPI:  Kristin [name] is a 83 y.o. female  has a past medical history of Arthritis, CAD (coronary artery disease), Central retinal vein occlusion, right eye, Chronic kidney disease, Depression, Diplopia, Ear pain, Eosinophilic esophagitis, Essential hypertension, GERD H/O: whooping cough, Hyperlipidemia with target LDL less than 70, Incontinence, and Stroke (HCC) (2001) who presents after a fall at an assisted living facility. At baseline she does have right-sided hemiplegia, dysarthria, aphasia from a previous stroke. MRI . acute/subacute infarction in the right basal ganglia and regional white matter tracks. Mild swelling but no significant mass effect. No hemorrhage. Old left middle cerebral artery territory infarction with atrophy, encephalomalacia and gliosis. Ex vacuo enlargement of the left lateral ventricle. Wallerian degeneration of the brainstem on the left due to the old left MCA stroke. No prior ST notes.    Assessment / Plan / Recommendation  Clinical Impression  Pt has expressive aphasia from prior stroke and uncertain if presentation is different from baseline. She was able to follow commands with visual, for oromotor exam revealing right labial asymmetry and lingua deviation to right. Missing  several teeth with majority of dentition is intact. Overall, oropharyngeal swallow appeared within normal limits. Her mastication was minimally prolonged but functional without residue. She did have one cough with thin liquid with straw following presentation or graham cracker possibly due to oral or pharyngeal particulae matter with  solid. Subsequent single straw sips consistently did not reveal s/s aspiration. Recommend regular texture, thin liquids, pills whole in applesauce, assist with meals sitting upright and slow pace. No further ST needed at this time. SLP Visit Diagnosis: Dysphagia, unspecified (R13.10)    Aspiration Risk  Mild aspiration risk    Diet Recommendation Regular;Thin liquid    Liquid Administration via: Straw;Cup Medication Administration: Whole meds with puree Supervision: Staff to assist with self feeding;Full supervision/cueing for compensatory strategies Compensations: Slow rate;Small sips/bites Postural Changes: Seated upright at 90 degrees    Other  Recommendations Oral Care Recommendations: Oral care BID    Recommendations for follow up therapy are one component of a multi-disciplinary discharge planning process, led by the attending physician.  Recommendations may be updated based on patient status, additional functional criteria and insurance authorization.  Follow up Recommendations No SLP follow up      Assistance Recommended at Discharge    Functional Status Assessment Patient has not had a recent decline in their functional status  Frequency and Duration            Prognosis Barriers to Reach Goals: Cognitive deficits      Swallow Study   General Date of Onset: 05/14/23 HPI: Kristin [name] is a 83 y.o. female  has a past medical history of Arthritis, CAD (coronary artery disease), Central retinal vein occlusion, right eye, Chronic kidney disease, Depression, Diplopia, Ear pain, Eosinophilic esophagitis, Essential hypertension, GERD H/O: whooping cough, Hyperlipidemia with target LDL less than 70, Incontinence, and Stroke (HCC) (2001) who presents after a fall at an assisted living facility. At baseline she does have right-sided hemiplegia, dysarthria, aphasia from a previous stroke. MRI . acute/subacute infarction in the right basal ganglia and regional white matter tracks. Mild  swelling but no significant mass effect. No hemorrhage. Old left middle cerebral artery territory infarction with atrophy, encephalomalacia and gliosis. Ex vacuo enlargement of the left lateral ventricle. Wallerian degeneration of the brainstem on the left due to the old left MCA stroke. No prior ST notes. Type of Study: Bedside Swallow Evaluation Previous Swallow Assessment:  (none) Diet Prior to this Study: NPO Temperature Spikes Noted: No Respiratory Status: Room air History of Recent Intubation: No Behavior/Cognition: Alert;Pleasant mood;Cooperative;Requires cueing Oral Cavity Assessment: Dry Oral Care Completed by SLP: No Oral Cavity - Dentition: Missing dentition (missing several, majority intact) Vision: Functional for self-feeding Self-Feeding Abilities: Needs assist Patient Positioning: Upright in bed Baseline Vocal Quality: Normal Volitional Cough: Weak Volitional Swallow: Unable to elicit    Oral/Motor/Sensory Function Overall Oral Motor/Sensory Function: Mild impairment Facial ROM: Reduced right;Suspected CN VII (facial) dysfunction Facial Symmetry: Abnormal symmetry right;Suspected CN VII (facial) dysfunction Lingual Symmetry: Abnormal symmetry right;Suspected CN XII (hypoglossal) dysfunction   Ice Chips Ice chips: Not tested   Thin Liquid Thin Liquid: Impaired Presentation: Straw;Cup Pharyngeal  Phase Impairments: Cough - Immediate    Nectar Thick Nectar Thick Liquid: Not tested   Honey Thick Honey Thick Liquid: Not tested   Puree Puree: Within functional limits   Solid     Solid: Within functional limits      Royce Macadamia 05/14/2023,8:43 AM

## 2023-05-14 NOTE — Evaluation (Signed)
Occupational Therapy Evaluation Patient Details Name: Kristin [name] MRN: [id number] DOB: May 17, 1940 Today's Date: 05/14/2023   History of Present Illness Kristin [name] is a 83 year old female presented to hospital from [facility] independent living facility with complaints of fall which was unwitnessed. Dandrea complains of mild left sided pain from the fall and some bruise over the right scalp area. MRI on 05/13/2023 revealed Acute/subacute infarction in the right basal ganglia and regional white matter tracks and chronic small-vessel ischemic changes of the white matter of both  hemispheres and of the pons. PMHx of CVA with right-sided hemiplegia, history of falls, history of residual aphasia, hypertension, depression, Chronic kidney disease, GERD, Diplopia, CAD, arthritis, central retinal vein occlusion, right eye.   Clinical Impression   [name] was evaluated s/p the above admission list. She resides at ILF, can transfer with RW from surfaces indep and completes ADLs with mod I at baseline. Upon evaluation the pt was limited by chronic R weakness, impaired communication and command following, poor balance and limited activity tolerance. Overall she needed total A +2 for all aspects of her care. Pt will benefit from continued acute OT services and skilled inpatient follow up therapy, <3 hours/day.        If plan is discharge home, recommend the following: A lot of help with walking and/or transfers;Two people to help with walking and/or transfers;A lot of help with bathing/dressing/bathroom;Two people to help with bathing/dressing/bathroom;Assistance with cooking/housework;Assistance with feeding;Direct supervision/assist for medications management;Direct supervision/assist for financial management;Help with stairs or ramp for entrance;Assist for transportation;Supervision due to cognitive status    Functional Status Assessment  Patient has had a recent decline in their functional status and demonstrates  the ability to make significant improvements in function in a reasonable and predictable amount of time.  Equipment Recommendations  None recommended by OT       Precautions / Restrictions Precautions Precautions: Fall Precaution Comments: fall within last  6 months Restrictions Weight Bearing Restrictions Per Provider Order: No      Mobility Bed Mobility Overal bed mobility: Needs Assistance Bed Mobility: Supine to Sit, Sit to Supine     Supine to sit: HOB elevated, Total assist, +2 for physical assistance Sit to supine: Total assist, +2 for physical assistance   General bed mobility comments: totalAx2 with use of bed pad to come to EOB and return to supine    Transfers                   General transfer comment: unable to due to level of involvement      Balance Overall balance assessment: Needs assistance Sitting-balance support: Feet supported, Bilateral upper extremity supported Sitting balance-Leahy Scale: Poor Sitting balance - Comments: able to maintain balance if UE/LE are positioned appropriately, 1x able to correct L lateral lean and then becomes to fatigued to maintain Postural control: Left lateral lean           ADL either performed or assessed with clinical judgement   ADL Overall ADL's : Needs assistance/impaired             General ADL Comments: total A for all aspects of ADLs at bed level     Vision Baseline Vision/History: 2 Legally blind Vision Assessment?: Vision impaired- to be further tested in functional context Additional Comments: R eye blind? at baseline     Perception Perception: Not tested       Praxis Praxis: Not tested       Pertinent Vitals/Pain Pain  Assessment Pain Assessment: Faces Faces Pain Scale: No hurt Pain Intervention(s): Monitored during session     Extremity/Trunk Assessment Upper Extremity Assessment Upper Extremity Assessment: LUE deficits/detail;RUE deficits/detail RUE Deficits / Details:  chronic R hemi, no activation, pt dependently moving RUE with L. Dtr reports this is her baseline function of the R RUE Sensation: decreased light touch;decreased proprioception RUE Coordination: decreased fine motor;decreased gross motor LUE Deficits / Details: seemingly WFL for self directed tasks, did not follow any commands to assess   Lower Extremity Assessment Lower Extremity Assessment: Defer to PT evaluation RLE Deficits / Details: when asked to move R LE able to evert ankle, and able to perform trace hip abduction, PROM WFL, LLE Deficits / Details: PROM WFL trace hip abduction on command when L LE is placed able to utilize to maintain balance   Cervical / Trunk Assessment Cervical / Trunk Assessment: Other exceptions Cervical / Trunk Exceptions: contracted at neck with L neck rotation and forward flexion   Communication Communication Communication: Difficulty communicating thoughts/reduced clarity of speech Cueing Techniques: Verbal cues;Gestural cues;Tactile cues;Visual cues   Cognition Arousal: Alert Behavior During Therapy: WFL for tasks assessed/performed Overall Cognitive Status: Difficult to assess             General Comments: residual aphasia from prior CVA at baseline. pt holdig up three fingers on her L hand, therapist able to promt her to change fingers to two and one while stating "three, two, one"     General Comments  VSS     Home Living Family/patient expects to be discharged to:: Assisted living                             Home Equipment: Chiropractor (4 wheels);Shower seat;Toilet riser;Grab bars - toilet;Grab bars - tub/shower;Lift chair   Additional Comments: daughter provided PLOF and facility set up      Prior Functioning/Environment Prior Level of Function : Needs assist             Mobility Comments: sleeps in recliner, stand from lift recliner with RW to transfer to scooter, able to transfer from scooter to and   from toilet ADLs Comments: bathes at sink and dresses independently , medications refilled to organizer but able to take them, goes to cafeteria for meals, able to open snacks and drinks        OT Problem List: Decreased strength;Decreased range of motion;Decreased activity tolerance      OT Treatment/Interventions: Self-care/ADL training;Therapeutic exercise;DME and/or AE instruction;Therapeutic activities;Patient/family education    OT Goals(Current goals can be found in the care plan section) Acute Rehab OT Goals Patient Stated Goal: unable to state OT Goal Formulation: With patient Time For Goal Achievement: 05/28/23 Potential to Achieve Goals: Fair ADL Goals Pt Will Perform Grooming: with mod assist;sitting Additional ADL Goal #1: Pt will complete bed mobility with max A +1 as a precursor to ADLs  OT Frequency: Min 1X/week       AM-PAC OT "6 Clicks" Daily Activity     Outcome Measure Help from another person eating meals?: Total Help from another person taking care of personal grooming?: Total Help from another person toileting, which includes using toliet, bedpan, or urinal?: Total Help from another person bathing (including washing, rinsing, drying)?: Total Help from another person to put on and taking off regular upper body clothing?: Total Help from another person to put on and taking off regular lower body clothing?: Total 6  Click Score: 6   End of Session Nurse Communication: Mobility status  Activity Tolerance: Patient tolerated treatment well Patient left: in bed;with call bell/phone within reach  OT Visit Diagnosis: Unsteadiness on feet (R26.81);Other abnormalities of gait and mobility (R26.89);Muscle weakness (generalized) (M62.81);Hemiplegia and hemiparesis Hemiplegia - Right/Left: Right Hemiplegia - dominant/non-dominant: Dominant Hemiplegia - caused by: Cerebral infarction                Time: 1044-1110 OT Time Calculation (min): 26 min Charges:  OT  General Charges $OT Visit: 1 Visit OT Evaluation $OT Eval Moderate Complexity: 1 Mod  Derenda Mis, OTR/L Acute Rehabilitation Services Office [phone] Secure Chat Communication Preferred   Donia Pounds 05/14/2023, 1:38 PM

## 2023-05-14 NOTE — TOC Initial Note (Signed)
Transition of Care [facility]) - Initial/Assessment Note    Patient Details  Name: Kristin [name] MRN: [id number] Date of Birth: 1940-11-18  [facility]) CM/SW Contact:    Baldemar Lenis, LCSW Phone Number: 05/14/2023, 2:51 PM  Clinical Narrative:       CSW spoke with daughter, Marliss Czar, at bedside, and spoke with daughter, [name], via phone. [name] is patient's HCPOA. CSW explained recommendation for SNF and family is in agreement, questioning if the patient will also need to move into SNF for long term care after rehab. Patient is previously from [facility] but they had started the process to move the patient into a SNF due to a decline in mobility. CSW explained process of sending referrals, received permission to send out patient's information. [name] will be coming to visit the patient this evening, CSW will leave bed offers at the bedside for her to review.           Expected Discharge Plan: Skilled Nursing Facility Barriers to Discharge: Continued Medical Work up, English as a second language teacher   Patient Goals and CMS Choice Patient states their goals for this hospitalization and ongoing recovery are:: patient unable to participate in goal setting, not fully oriented CMS Medicare.gov Compare Post Acute Care list provided to:: Patient Represenative (must comment) Choice offered to / list presented to : Adult Children [place] ownership interest in [facility].provided to:: Adult Children    Expected Discharge Plan and Services     Post Acute Care Choice: Skilled Nursing Facility Living arrangements for the past 2 months: Independent Living Facility                                      Prior Living Arrangements/Services Living arrangements for the past 2 months: Independent Living Facility Lives with:: Facility Resident Patient language and need for interpreter reviewed:: No Do you feel safe going back to the place where you live?: Yes       Need for Family Participation in Patient Care: Yes (Comment) Care giver support system in place?: Yes (comment)   Criminal Activity/Legal Involvement Pertinent to Current Situation/Hospitalization: No - Comment as needed  Activities of Daily Living   ADL Screening (condition at time of admission) Independently performs ADLs?: No Does the patient have a NEW difficulty with bathing/dressing/toileting/self-feeding that is expected to last >3 days?: No Does the patient have a NEW difficulty with getting in/out of bed, walking, or climbing stairs that is expected to last >3 days?: No Does the patient have a NEW difficulty with communication that is expected to last >3 days?: No Is the patient deaf or have difficulty hearing?: No Does the patient have difficulty seeing, even when wearing glasses/contacts?: No Does the patient have difficulty concentrating, remembering, or making decisions?: Yes  Permission Sought/Granted Permission sought to share information with : Facility Medical sales representative, Family Supports Permission granted to share information with : Yes, Verbal Permission Granted  Share Information with NAME: [name]  Permission granted to share info w AGENCY: SNF  Permission granted to share info w Relationship: Daughter     Emotional Assessment Appearance:: Appears stated age Attitude/Demeanor/Rapport: Unable to Assess Affect (typically observed): Unable to Assess Orientation: : Oriented to Self, Oriented to Place, Oriented to  Time, Oriented to Situation Alcohol / Substance Use: Not Applicable Psych Involvement: No (comment)  Admission diagnosis:  CVA (cerebral vascular accident) (HCC) [I63.9] Cerebrovascular accident (CVA), unspecified  mechanism [facility]) [I63.9] Patient Active Problem List   Diagnosis Date Noted   CVA (cerebral vascular accident) (HCC) 05/13/2023   Diverticulosis of colon with hemorrhage 05/24/2022   Acute lower GI bleeding 05/23/2022   Acute blood loss  anemia 05/23/2022   PAD (peripheral artery disease) (HCC) 08/31/2021   Recurrent UTI 08/31/2021   OAB (overactive bladder) 02/29/2020   Fever 07/07/2019   Nephrolithiasis 02/24/2017   Colitis 02/24/2017   Hypokalemia 02/24/2017   AKI (acute kidney injury) (HCC) 02/24/2017   History of stroke 02/04/2017   Female stress incontinence 01/09/2017   Ureteral calculus, right 01/09/2017   Nocturia 12/26/2016   Left ear pain 11/07/2016   Chest pain syndrome    Chest pain 10/17/2015   IDA (iron deficiency anemia) 10/04/2015   Right rotator cuff tendinitis 08/14/2015   Shoulder subluxation, right 07/06/2014   S/P CABG x 2 12/17/2013   Essential hypertension    Unstable angina (HCC) 12/09/2013   CAD (coronary artery disease) 12/09/2013   Dyslipidemia, goal LDL below 70 12/09/2013   Prolonged QT interval 12/09/2013   Unilateral primary osteoarthritis, right knee 06/01/2012   Lumbago 06/01/2012   Spastic hemiplegia affecting dominant side (HCC) 06/01/2012   Aphasia as late effect of stroke 06/01/2012   PCP:  Sharon Seller, NP Pharmacy:   [facility] [place], [place] - [address] [facility] [address] [place] [address] Phone: [phone] Fax: [phone]     Social Drivers of Health (SDOH) Social History: SDOH Screenings   Food Insecurity: Patient Unable To Answer (05/14/2023)  Housing: Low Risk  (05/14/2023)  Transportation Needs: No Transportation Needs (05/14/2023)  Utilities: Patient Unable To Answer (05/14/2023)  Depression (PHQ2-9): Low Risk  (03/10/2023)  Financial Resource Strain: Low Risk  (05/13/2023)  Physical Activity: Unknown (05/13/2023)  Social Connections: Patient Unable To Answer (05/14/2023)  Recent Concern: Social Connections - Socially Isolated (05/13/2023)  Stress: Patient Declined (05/13/2023)  Tobacco Use: Low Risk  (05/13/2023)   SDOH Interventions:     Readmission Risk Interventions     No data to display

## 2023-05-14 NOTE — Plan of Care (Signed)
  Problem: Nutrition: Goal: Risk of aspiration will decrease 05/14/2023 0505 by Caroll Rancher, RN Outcome: Not Progressing 05/14/2023 0035 by Caroll Rancher, RN Outcome: Not Progressing

## 2023-05-14 NOTE — Progress Notes (Signed)
[facility] Progress Note Patient: Kristin [name] ZHY:[id number] DOB: 19-Feb-1941 DOA: 05/13/2023  DOS: the patient was seen and examined on 05/14/2023  Brief Hospital Course: PMH of CVA with chronic right-sided hemiplegia, aphasia, HTN, CAD, mood disorder, GERD, HLD, chronic nocturia. Presented with complaints of generalized fatigue and weakness. Workup found to have acute CVA. Neurology was consulted.  Assessment and Plan: Subacute right carotid head infarct. Likely small vessel disease. CT scan showed right basal ganglia infarct. CT head showed left MCA chronic occlusion and other nonsignificant occlusions and stenosis. MRI confirms the stroke. LDL 82.  Continue statin. Echocardiogram EF 6065%. Hemoglobin A1c 5.7. On aspirin and Plavix prior to admission.  Currently family agrees to discontinue DDAVP and therefore we will continue aspirin and Plavix. Neurology signed off. PT OT recommending SNF. SLP recommending regular diet.  HLD. On Lipitor 40. LDL 82. Continue.  HTN. Blood pressure stable. Monitor.  Chronic nocturia. On DDAVP. Also on Myrbetriq as well as estrogen therapy. To minimize the stroke reduction risk, family currently agrees to discontinue DDAVP.  Obesity Class 1 Body mass index is 30.82 kg/m.  Placing the pt at higher risk of poor outcomes.  CAD. Currently no chest pain. On dual antiplatelet therapy.   Hypernatremia. Hypokalemia. Likely from poor p.o. intake. Replace, recheck and monitor.  Subjective: No nausea no vomiting no fever no chills.  Chronically unable to communicate, able to understand.  Able to follow commands.  Physical Exam: General: in Mild distress, No Rash Cardiovascular: S1 and S2 Present, No Murmur Respiratory: Good respiratory effort, Bilateral Air entry present. No Crackles, No wheezes Abdomen: Bowel Sound present, No tenderness Extremities: No edema Neuro: Alert and mostly nonverbal with answering questions in yes and no  and occasionally with moans.  Chronic right-sided weakness.   Data Reviewed: I have Reviewed nursing notes, Vitals, and Lab results. Since last encounter, pertinent lab results CBC and BMP   . I have ordered test including CBC and BMP  . I have discussed pt's care plan and test results with neurology  .   Disposition: Status is: Inpatient Remains inpatient appropriate because: Awaiting placement  enoxaparin (LOVENOX) injection 40 mg Start: 05/13/23 1630   Family Communication: Discussed with multiple family members Level of care: Telemetry Medical   Vitals:   05/14/23 1209 05/14/23 1215 05/14/23 1400 05/14/23 1522  BP: 135/89   (!) 151/89  Pulse: 100   97  Resp:  (!) 22 20 17   Temp: 99.6 F (37.6 C)   98.4 F (36.9 C)  TempSrc: Oral   Oral  SpO2: (!) 88%   94%  Weight:      Height:         Author: Lynden Oxford, MD 05/14/2023 5:47 PM  Please look on [email or web address] to find out who is on call.

## 2023-05-15 DIAGNOSIS — I63311 Cerebral infarction due to thrombosis of right middle cerebral artery: Secondary | ICD-10-CM | POA: Diagnosis not present

## 2023-05-15 LAB — CBC
HCT: 43.2 % (ref 36.0–46.0)
Hemoglobin: 13.3 g/dL (ref 12.0–15.0)
MCH: 26.6 pg (ref 26.0–34.0)
MCHC: 30.8 g/dL (ref 30.0–36.0)
MCV: 86.4 fL (ref 80.0–100.0)
Platelets: 301 10*3/uL (ref 150–400)
RBC: 5 MIL/uL (ref 3.87–5.11)
RDW: 15 % (ref 11.5–15.5)
WBC: 8.3 10*3/uL (ref 4.0–10.5)
nRBC: 0 % (ref 0.0–0.2)

## 2023-05-15 LAB — BASIC METABOLIC PANEL
Anion gap: 9 (ref 5–15)
BUN: 13 mg/dL (ref 8–23)
CO2: 28 mmol/L (ref 22–32)
Calcium: 8.6 mg/dL — ABNORMAL LOW (ref 8.9–10.3)
Chloride: 108 mmol/L (ref 98–111)
Creatinine, Ser: 0.64 mg/dL (ref 0.44–1.00)
GFR, Estimated: >60 mL/min (ref >60)
Glucose, Bld: 137 mg/dL — ABNORMAL HIGH (ref 70–99)
Potassium: 3.9 mmol/L (ref 3.5–5.1)
Sodium: 145 mmol/L (ref 135–145)

## 2023-05-15 LAB — MAGNESIUM: Magnesium: 2 mg/dL (ref 1.7–2.4)

## 2023-05-15 MED ORDER — ENSURE ENLIVE PO LIQD: 237.0000 mL | Freq: Two times a day (BID) | ORAL | Status: DC

## 2023-05-15 MED ORDER — POTASSIUM CHLORIDE CRYS ER 10 MEQ PO TBCR: 10.0000 meq | EXTENDED_RELEASE_TABLET | Freq: Every day | ORAL | 0 refills | Status: DC

## 2023-05-15 MED ORDER — ORAL CARE MOUTH RINSE: 15.0000 mL | OROMUCOSAL | Status: DC | PRN

## 2023-05-15 MED ORDER — PANTOPRAZOLE SODIUM 40 MG PO TBEC: 40.0000 mg | DELAYED_RELEASE_TABLET | Freq: Every day | ORAL | 0 refills | Status: DC

## 2023-05-15 MED ORDER — POTASSIUM CHLORIDE CRYS ER 20 MEQ PO TBCR
40.0000 meq | EXTENDED_RELEASE_TABLET | Freq: Once | ORAL | Status: AC
  Administered 2023-05-15: 40 meq via ORAL
  Filled 2023-05-15: qty 2

## 2023-05-15 MED ORDER — POTASSIUM CHLORIDE CRYS ER 10 MEQ PO TBCR
10.0000 meq | EXTENDED_RELEASE_TABLET | Freq: Every day | ORAL | Status: DC
Start: 2023-05-15 — End: 2023-05-16
  Administered 2023-05-15 – 2023-05-16 (×2): 10 meq via ORAL
  Filled 2023-05-15 (×2): qty 1

## 2023-05-15 MED ORDER — ORAL CARE MOUTH RINSE
15.0000 mL | OROMUCOSAL | Status: DC
  Administered 2023-05-15 – 2023-05-16 (×7): 15 mL via OROMUCOSAL

## 2023-05-15 NOTE — Progress Notes (Addendum)
Speech Language Pathology Treatment: Dysphagia  Patient Details Name: Kristin Kristin [name] MRN: [id number] DOB: 1940/04/23 Today's Date: 05/15/2023 Time: 4403-4742 SLP Time Calculation (min) (ACUTE ONLY): 7 min  Assessment / Plan / Recommendation Clinical Impression  Pt evaluated yesterday and deemed safe with regular/thin liquids, no follow up needed (swallow orders still in chart). RN reach out to ST and stated pt took sausage patty well but began coughing consistently with the toast and stopped feeding. This SLP reached out to pt's step-daughter Kristin Kristin [name] who is her HCPOA and very knowledgeable about her past and present functioning. She consumes a regular texture at home but does have "choking at times with drier textures" and occasional cough with liquids. This SLP observed pt with solid graham cracker which she masticated and cleared oral cavity without difficulty; slight throat clear x 1 after sip thin via straw but majority were consumed without s/s aspiration. Breakfast tray still in room and SLP noted that the toast was very hard and dry. Given difficulty this morning, history of occasional problems with drier textures and acute stroke, will downgrade texture to Dys 2, continue thin liquids, meds whole in applesauce. Discussed with step-daughter who agreed this was reasonable. ST will continue to treat and feel she may continue to have times where certain textures are harder for her. Kristin Kristin [name] states she has not had recent pna. Ensure that pt is sitting up with full supervision and assist with meals, small bites/sips.  Discussed pt's language abilities with Kristin Kristin [name] who endorses pt's expressive aphasia is chronic, some receptive aphasia and appears to be at her baseline status and has not worsened. Speech-language-cognitive assessment not warranted at this time.    HPI HPI: Kristin Kristin [name] is a 83 y.o. female  has a past medical history of Arthritis, CAD (coronary artery disease), Central retinal vein occlusion, right  eye, Chronic kidney disease, Depression, Diplopia, Ear pain, Eosinophilic esophagitis, Essential hypertension, GERD H/O: whooping cough, Hyperlipidemia with target LDL less than 70, Incontinence, and Stroke (HCC) (2001) who presents after a fall at an assisted living facility. At baseline she does have right-sided hemiplegia, dysarthria, aphasia from a previous stroke. MRI . acute/subacute infarction in the right basal ganglia and regional white matter tracks. Mild swelling but no significant mass effect. No hemorrhage. Old left middle cerebral artery territory infarction with atrophy, encephalomalacia and gliosis. Ex vacuo enlargement of the left lateral ventricle. Wallerian degeneration of the brainstem on the left due to the old left MCA stroke. No prior ST notes.      SLP Plan  Continue with current plan of Kristin [name]      Recommendations for follow up therapy are one component of a multi-disciplinary discharge planning process, led by the attending physician.  Recommendations may be updated based on patient status, additional functional criteria and insurance authorization.    Recommendations  Diet recommendations: Dysphagia 2 (fine chop);Thin liquid Liquids provided via: Straw;Cup Medication Administration: Whole meds with puree Supervision: Staff to assist with self feeding;Full supervision/cueing for compensatory strategies Compensations: Slow rate;Small sips/bites Postural Changes and/or Swallow Maneuvers: Seated upright 90 degrees                  Oral Kristin [name] BID   Frequent or constant Supervision/Assistance Dysphagia, unspecified (R13.10)     Continue with current plan of Kristin [name]     Kristin Kristin [name]  05/15/2023, 10:50 AM

## 2023-05-15 NOTE — TOC Progression Note (Addendum)
Transition of Care [facility]) - Progression Note    Patient Details  Name: Kristin [name] MRN: [id number] Date of Birth: 10-Jan-1941  [facility]) CM/SW Contact  Antion Felipa [place], Student-Social Work Phone Number: 05/15/2023, 10:48 AM  Clinical Narrative:   MSW Student contacted patients daughter Kristin [name] by phone following up about SNF's choices given yesterday. [name] reported that her family are going to tour [employer], and [employer]. [name] indicated that she could not get in contact with anyone at [facility] to set up a tour. MSW Student and CSW called a contact at [facility] and passed on that number to [place]. [name] stated that she will let MSW Student and CSW know the families selection within the next couple of hours.    UPDATE: MSW Student and CSW were updated by daughter Kristin [name] that the [facility] chose [facility]. MSW student requested CMA to start insurance Auth.    UPDATE: Patient received insurance approval for [facility] but no bed available today. CSW updated daughter, Kristin [name], and MD. Plan to discharge to SNF tomorrow.  Expected Discharge Plan: Skilled Nursing Facility Barriers to Discharge: Continued Medical Work up, English as a second language teacher  Expected Discharge Plan and Services     Post Acute Care Choice: Skilled Nursing Facility Living arrangements for the past 2 months: Independent Living Facility                                       Social Determinants of Health (SDOH) Interventions SDOH Screenings   Food Insecurity: Patient Unable To Answer (05/14/2023)  Housing: Low Risk  (05/14/2023)  Transportation Needs: No Transportation Needs (05/14/2023)  Utilities: Patient Unable To Answer (05/14/2023)  Depression (PHQ2-9): Low Risk  (03/10/2023)  Financial Resource Strain: Low Risk  (05/13/2023)  Physical Activity: Unknown (05/13/2023)  Social Connections: Patient Unable To Answer (05/14/2023)  Recent Concern: Social Connections - Socially  Isolated (05/13/2023)  Stress: Patient Declined (05/13/2023)  Tobacco Use: Low Risk  (05/13/2023)    Readmission Risk Interventions     No data to display

## 2023-05-15 NOTE — Progress Notes (Signed)
TRH night cross cover note:   I was notified by RN of the patient's most recent potassium level 3.2 when checked at 1700 on 05/14/23.  This is in the context of patient receiving potassium chloride 40 meq po x 1 dose at 0900 on 05/14/23.   I subsequently ordered potassium chloride 40 meq po x 1 dose now.     Newton Pigg, DO Hospitalist

## 2023-05-15 NOTE — Plan of Care (Signed)
Alert, unable to assess orientation other than self and hospital.  NIHSS completed per order.  Family at bedside.  Problem: Education: Goal: Knowledge of disease or condition will improve Outcome: Progressing Goal: Knowledge of secondary prevention will improve (MUST DOCUMENT ALL) Outcome: Progressing Goal: Knowledge of patient specific risk factors will improve (DELETE if not current risk factor) Outcome: Progressing   Problem: Ischemic Stroke/TIA Tissue Perfusion: Goal: Complications of ischemic stroke/TIA will be minimized Outcome: Progressing   Problem: Coping: Goal: Will verbalize positive feelings about self Outcome: Progressing Goal: Will identify appropriate support needs Outcome: Progressing

## 2023-05-15 NOTE — Discharge Summary (Signed)
Physician Discharge Summary   Patient: Kristin [name] MRN: [id number] DOB: Sep 17, 1940  Admit date:     05/13/2023  Discharge date: 05/15/23  Discharge Physician: Lynden Oxford  PCP: Sharon Seller, NP  Recommendations at discharge:  Follow up with PCP and Neurology    Follow-up Information     [facility]. Schedule an appointment as soon as possible for a visit in 1 month(s).   Specialty: Neurology Why: stroke clinic Contact information: [address] [place] [address] [phone]        Sharon Seller, NP. Schedule an appointment as soon as possible for a visit in 1 week(s).   Specialty: Geriatric Medicine Why: with BMP lab to look at kidney and electrolytes Contact information: [address] [place] [address] [phone]                Discharge Diagnoses: Principal Problem:   CVA (cerebral vascular accident) (HCC) Active Problems:   Dyslipidemia, goal LDL below 70   Spastic hemiplegia affecting dominant side (HCC)   Aphasia as late effect of stroke   Essential hypertension   S/P CABG x 2   Hypokalemia  Hospital Course: PMH of CVA with chronic right-sided hemiplegia, aphasia, HTN, CAD, mood disorder, GERD, HLD, chronic nocturia. Presented with complaints of generalized fatigue and weakness. Workup found to have acute CVA. Neurology was consulted.  Assessment and Plan: Subacute right carotid head infarct. Likely small vessel disease. CT scan showed right basal ganglia infarct. CT head showed left MCA chronic occlusion and other nonsignificant occlusions and stenosis. MRI confirms the stroke. LDL 82.  Continue statin. Echocardiogram EF 6065%. Hemoglobin A1c 5.7. On aspirin and Plavix prior to admission.  Currently family agrees to discontinue DDAVP and therefore we will continue aspirin and Plavix. Neurology signed off. PT OT recommending SNF. SLP recommending D2 diet.  HLD. On  Lipitor 40. LDL 82. Continue.  HTN. Blood pressure stable. Monitor.  Chronic nocturia. On DDAVP. Also on Myrbetriq as well as estrogen therapy. To minimize the stroke reduction risk, family currently agrees to discontinue DDAVP.  Obesity Class 1 Body mass index is 30.82 kg/m.  Placing the pt at higher risk of poor outcomes.  CAD. Currently no chest pain. On dual antiplatelet therapy.  GErd Switch omeprazole to protonix  Consultants:  Neurology   Procedures performed:  Echocardiogram   DISCHARGE MEDICATION: Allergies as of 05/15/2023   No Known Allergies      Medication List     STOP taking these medications    desmopressin 0.2 MG tablet Commonly known as: DDAVP   omeprazole 20 MG capsule Commonly known as: PRILOSEC Replaced by: pantoprazole 40 MG tablet       TAKE these medications    acetaminophen 500 MG tablet Commonly known as: TYLENOL Take 1,000 mg by mouth 3 (three) times daily as needed for mild pain (pain score 1-3), headache or fever.   aspirin 81 MG tablet Take 81 mg by mouth in the morning.   atorvastatin 40 MG tablet Commonly known as: LIPITOR tAKE ONE TABLET BY MOUTH DAILY. What changed: when to take this   clopidogrel 75 MG tablet Commonly known as: PLAVIX TAKE ONE TABLET BY MOUTH DAILY What changed: when to take this   COQ-10 PO Take 1 tablet daily by mouth.   diclofenac Sodium 1 % Gel Commonly known as: VOLTAREN APPLY TO THE AFFECTED AREA(S) THREE TIMES DAILY.   DULoxetine 20 MG capsule Commonly known as: CYMBALTA  TAKE ONE CAPSULE BY MOUTH DAILY What changed: when to take this   estradiol 0.1 MG/GM vaginal cream Commonly known as: ESTRACE Place 1 Applicatorful vaginally 3 (three) times a week.   feeding supplement Liqd Take 237 mLs by mouth 2 (two) times daily between meals.   ferrous sulfate 325 (65 FE) MG tablet Take 325 mg by mouth See admin instructions. Take 325 mg by mouth WITH FOOD three times a week    gabapentin 300 MG capsule Commonly known as: NEURONTIN TAKE ONE CAPSULE BY MOUTH THREE TIMES DAILY What changed: when to take this   hydrocortisone 2.5 % rectal cream Commonly known as: ANUSOL-HC Place 1 Application rectally as needed for hemorrhoids.   methocarbamol 500 MG tablet Commonly known as: ROBAXIN Take 1 tablet (500 mg total) by mouth every 8 (eight) hours as needed for muscle spasms.   mirabegron ER 50 MG Tb24 tablet Commonly known as: MYRBETRIQ Take 50 mg by mouth in the morning.   nitroGLYCERIN 0.4 MG SL tablet Commonly known as: Nitrostat Place 1 tablet (0.4 mg total) under the tongue every 5 (five) minutes as needed for chest pain.   pantoprazole 40 MG tablet Commonly known as: PROTONIX Take 1 tablet (40 mg total) by mouth daily. Start taking on: May 16, 2023 Replaces: omeprazole 20 MG capsule   polyethylene glycol 17 g packet Commonly known as: MIRALAX / GLYCOLAX Take 17 g by mouth daily. What changed:  when to take this reasons to take this   potassium chloride 10 MEQ tablet Commonly known as: KLOR-CON M Take 1 tablet (10 mEq total) by mouth daily. Start taking on: May 16, 2023   trimethoprim 100 MG tablet Commonly known as: TRIMPEX Take 100 mg by mouth every evening.   TURMERIC PO Take 1 capsule by mouth daily.   Vitamin D3 50 MCG (2000 UT) capsule Take 2,000 Units by mouth at bedtime.       Disposition: SNF Diet recommendation: Dysphagia type 2 thin Liquid  Discharge Exam: Vitals:   05/15/23 0509 05/15/23 0516 05/15/23 0800 05/15/23 1238  BP: (!) 148/91  (!) 152/90 (!) 154/99  Pulse: 93  96 96  Resp: 20  18 16   Temp: 98.9 F (37.2 C)  98.6 F (37 C) 98.7 F (37.1 C)  TempSrc: Oral  Oral Oral  SpO2: 93%  90% 90%  Weight:  85.3 kg    Height:       General: Appear in mild distress; no visible Abnormal Neck Mass Or lumps, Conjunctiva normal Cardiovascular: S1 and S2 Present, no Murmur, Respiratory: good respiratory  effort, Bilateral Air entry present and CTA, no Crackles, no wheezes Abdomen: Bowel Sound present, Non tender  Extremities: no Pedal edema Neurology: alert and non verbal answers questions in yes and no. Able to follow all commands. Chronic right sided hemiparesis.  Filed Weights   05/13/23 0858 05/15/23 0516  Weight: 84 kg 85.3 kg   Condition at discharge: stable  The results of significant diagnostics from this hospitalization (including imaging, microbiology, ancillary and laboratory) are listed below for reference.   Imaging Studies: ECHOCARDIOGRAM COMPLETE Result Date: 05/14/2023    ECHOCARDIOGRAM REPORT   Patient Name:   CHANTRA WERTHEIMER Date of Exam: 05/14/2023 Medical Rec #:  [id number]  Height:       65.0 in Accession #:    [id number] Weight:       185.2 lb Date of Birth:  02/19/1941   BSA:          1.914  m Patient Age:    83 years   BP:           156/92 mmHg Patient Gender: F          HR:           94 bpm. Exam Location:  Inpatient Procedure: 2D Echo, Cardiac Doppler, Color Doppler and Intracardiac            Opacification Agent Indications:    Stroke  History:        Patient has prior history of Echocardiogram examinations, most                 recent 07/07/2019. CAD, Stroke and PAD; Risk                 Factors:Hypertension.  Sonographer:    Amy Chionchio Referring Phys: [id number] [facility] [place]REL IMPRESSIONS  1. Left ventricular ejection fraction, by estimation, is 60 to 65%. The left ventricle has normal function. The left ventricle has no regional wall motion abnormalities. There is mild left ventricular hypertrophy. Left ventricular diastolic parameters are consistent with Grade I diastolic dysfunction (impaired relaxation).  2. Right ventricular systolic function is mildly reduced. The right ventricular size is normal.  3. The mitral valve is normal in structure. Trivial mitral valve regurgitation.  4. The aortic valve is tricuspid. Aortic valve regurgitation is trivial. Mild aortic valve stenosis.  Vmax 2.5 m/s, MG 13 mmHg, DI 0.42 FINDINGS  Left Ventricle: Left ventricular ejection fraction, by estimation, is 60 to 65%. The left ventricle has normal function. The left ventricle has no regional wall motion abnormalities. The left ventricular internal cavity size was normal in size. There is  mild left ventricular hypertrophy. Left ventricular diastolic parameters are consistent with Grade I diastolic dysfunction (impaired relaxation). Right Ventricle: The right ventricular size is normal. No increase in right ventricular wall thickness. Right ventricular systolic function is mildly reduced. Left Atrium: Left atrial size was not well visualized. Right Atrium: Right atrial size was not well visualized. Pericardium: There is no evidence of pericardial effusion. Mitral Valve: The mitral valve is normal in structure. Trivial mitral valve regurgitation. MV peak gradient, 8.1 mmHg. The mean mitral valve gradient is 3.0 mmHg. Tricuspid Valve: The tricuspid valve is normal in structure. Tricuspid valve regurgitation is trivial. Aortic Valve: The aortic valve is tricuspid. Aortic valve regurgitation is trivial. Mild aortic stenosis is present. Aortic valve mean gradient measures 8.2 mmHg. Aortic valve peak gradient measures 14.0 mmHg. Aortic valve area, by VTI measures 1.68 cm. Pulmonic Valve: The pulmonic valve was not well visualized. Pulmonic valve regurgitation is not visualized. Aorta: The aortic root and ascending aorta are structurally normal, with no evidence of dilitation. IAS/Shunts: The interatrial septum was not well visualized.  LEFT VENTRICLE PLAX 2D LVIDd:         4.30 cm   Diastology LVIDs:         3.10 cm   LV e' medial:    3.70 cm/s LV PW:         1.10 cm   LV E/e' medial:  17.2 LV IVS:        0.90 cm   LV e' lateral:   5.11 cm/s LVOT diam:     2.00 cm   LV E/e' lateral: 12.4 LV SV:         44 LV SV Index:   23 LVOT Area:     3.14 cm  RIGHT VENTRICLE TAPSE (M-mode): 1.2 cm AORTIC VALVE  PULMONIC VALVE AV Area (Vmax):    1.49 cm      PV Vmax:       0.95 m/s AV Area (Vmean):   1.51 cm      PV Peak grad:  3.6 mmHg AV Area (VTI):     1.68 cm AV Vmax:           187.20 cm/s AV Vmean:          128.420 cm/s AV VTI:            0.261 m AV Peak Grad:      14.0 mmHg AV Mean Grad:      8.2 mmHg LVOT Vmax:         88.65 cm/s LVOT Vmean:        61.850 cm/s LVOT VTI:          0.140 m LVOT/AV VTI ratio: 0.53  AORTA Ao Root diam: 3.00 cm Ao Asc diam:  3.70 cm MITRAL VALVE                TRICUSPID VALVE MV Area (PHT): 4.21 cm     TR Peak grad:   27.2 mmHg MV Area VTI:   2.07 cm     TR Vmax:        261.00 cm/s MV Peak grad:  8.1 mmHg MV Mean grad:  3.0 mmHg     SHUNTS MV Vmax:       1.42 m/s     Systemic VTI:  0.14 m MV Vmean:      76.3 cm/s    Systemic Diam: 2.00 cm MV Decel Time: 180 msec MV E velocity: 63.60 cm/s MV A velocity: 126.00 cm/s MV E/A ratio:  0.50 Epifanio Lesches MD Electronically signed by Epifanio Lesches MD Signature Date/Time: 05/14/2023/1:27:23 PM    Final    CT ANGIO HEAD NECK W WO CM Result Date: 05/13/2023 CLINICAL DATA:  Neuro deficit, acute, stroke suspected. Unwitnessed fall. Found on the floor. Speech disturbance. Acute right basal ganglia infarction by CT. EXAM: CT ANGIOGRAPHY HEAD AND NECK WITH AND WITHOUT CONTRAST TECHNIQUE: Multidetector CT imaging of the head and neck was performed using the standard protocol during bolus administration of intravenous contrast. Multiplanar CT image reconstructions and MIPs were obtained to evaluate the vascular anatomy. Carotid stenosis measurements (when applicable) are obtained utilizing NASCET criteria, using the distal internal carotid diameter as the denominator. RADIATION DOSE REDUCTION: This exam was performed according to the departmental dose-optimization program which includes automated exposure control, adjustment of the mA and/or kV according to patient size and/or use of iterative reconstruction technique. CONTRAST:   75mL OMNIPAQUE IOHEXOL 350 MG/ML SOLN COMPARISON:  CT and MRI same day FINDINGS: CTA NECK FINDINGS Aortic arch: Previous median sternotomy and CABG. Branching pattern is normal without origin stenosis. Right carotid system: Common carotid artery is tortuous but widely patent to the bifurcation. Calcified plaque at the carotid bifurcation and ICA bulb. Minimal diameter of the ICA bulb is the same as the more distal cervical ICA, therefore no stenosis. Left carotid system: Common carotid artery is tortuous but widely patent to the bifurcation. Calcified plaque at the carotid bifurcation and ICA bulb. Minimal diameter in proximal bulb measures 1.8 mm. Compared to a more distal cervical ICA diameter of 4.5 mm, this indicates a 60% stenosis. Beyond that, the vessel is patent to the skull base. Vertebral arteries: No proximal subclavian stenosis. Calcified plaque adjacent to the right vertebral artery origin but no stenosis greater than 30%. No  left vertebral artery origin stenosis. The vessels are then patent through the cervical region to the foramen magnum. Skeleton: Chronic cervical fusion C2 through C6. Chronic degenerative changes above and below that. Other neck: No mass or lymphadenopathy. Upper chest: Lung apices are clear. Review of the MIP images confirms the above findings CTA HEAD FINDINGS Anterior circulation: Both internal carotid arteries are patent through the skull base and siphon regions. There is siphon atherosclerotic calcification but no stenosis greater than 50% suspected. On the right, the anterior and middle cerebral vessels are patent. There is some atherosclerotic irregularity of the M1 segment on the right but no flow limiting stenosis. On the left, the anterior cerebral arteries patent. There is chronic occlusion of the left middle cerebral artery. Posterior circulation: Both vertebral arteries are patent through the foramen magnum to the basilar artery. There is atherosclerotic disease of  both vertebral artery V4 segments with stenosis estimated at 50% on both sides. The basilar artery shows some atherosclerotic irregularity but no flow limiting stenosis. Posterior circulation branch vessels show flow. Right PCA takes origin from the anterior circulation. There is moderate stenosis of the proximal left P1 segment. Venous sinuses: Patent and normal. Anatomic variants: None significant. Review of the MIP images confirms the above findings IMPRESSION: 1. No acute vascular finding. Right M1 segment show some atherosclerotic irregularity but no flow limiting stenosis. 2. Atherosclerotic disease at both carotid bifurcations. 60% stenosis of the proximal left ICA bulb. No right ICA stenosis. 3. Atherosclerotic disease of both vertebral artery V4 segments with stenosis estimated at 50% on both sides. 4. Atherosclerotic disease of both carotid siphon regions with bilateral stenosis estimated at 50%. 5. 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 6. Moderate stenosis of the proximal left P1 segment. Electronically Signed   By: Paulina Fusi M.D.   On: 05/13/2023 19:06   MR BRAIN WO CONTRAST Result Date: 05/13/2023 CLINICAL DATA:  Neuro deficit, acute, stroke suspected. Unwitnessed fall and confusion. Speech disturbance. EXAM: MRI HEAD WITHOUT CONTRAST TECHNIQUE: Multiplanar, multiecho pulse sequences of the brain and surrounding structures were obtained without intravenous contrast. COMPARISON:  Head CT earlier same day FINDINGS: Brain: Acute/subacute infarction in the right basal ganglia and regional white matter tracks. Mild swelling but no significant mass effect. No hemorrhage. No other acute infarction. Old left middle cerebral artery territory infarction with atrophy, encephalomalacia and gliosis. Ex vacuo enlargement of the left lateral ventricle. Chronic small-vessel ischemic changes of the white matter of both hemispheres. Chronic small-vessel ischemic changes of the pons. Wallerian  degeneration of the brainstem on the left due to the old left MCA stroke. No hydrocephalus. No extra-axial collection. Vascular: Major vessels at the base of the brain show flow. Skull and upper cervical spine: Negative Sinuses/Orbits: Mild seasonal mucosal thickening. Small right mastoid effusion. Orbits negative. Other: None IMPRESSION: 1. Acute/subacute infarction in the right basal ganglia and regional white matter tracks. Mild swelling but no significant mass effect. No hemorrhage. 2. Old left middle cerebral artery territory infarction with atrophy, encephalomalacia and gliosis. Ex vacuo enlargement of the left lateral ventricle. Wallerian degeneration of the brainstem on the left due to the old left MCA stroke. 3. Chronic small-vessel ischemic changes of the white matter of both hemispheres and of the pons. Electronically Signed   By: Paulina Fusi M.D.   On: 05/13/2023 15:00   DG Chest Portable 1 View Result Date: 05/13/2023 CLINICAL DATA:  Fall. EXAM: PORTABLE CHEST 1 VIEW COMPARISON:  Chest radiograph dated May 07, 2023.  FINDINGS: Stable cardiomediastinal silhouette. Prior median sternotomy and CABG. Low lung volumes. Trace left effusion versus pleural thickening. No focal consolidation or pneumothorax. No acute osseous abnormality identified. IMPRESSION: Trace left effusion versus pleural thickening, similar to slightly less prominent compared to the prior exam, which could be somewhat attributable to variations in positioning. Otherwise, no acute findings in the chest. Electronically Signed   By: Hart Robinsons M.D.   On: 05/13/2023 10:26   DG Knee 2 Views Left Result Date: 05/13/2023 CLINICAL DATA:  Fall and left knee pain. EXAM: LEFT KNEE - 1-2 VIEW COMPARISON:  None Available. FINDINGS: There is no acute fracture or dislocation. The bones are osteopenic. There is severe osteoarthritic changes with tricompartmental narrowing. There is near bone-on-bone contact in the lateral compartment.  Small suprapatellar effusion. The soft tissues are unremarkable. Vascular calcifications noted. IMPRESSION: 1. No acute fracture or dislocation. 2. Severe osteoarthritis. Electronically Signed   By: Elgie Collard M.D.   On: 05/13/2023 10:23   CT Cervical Spine Wo Contrast Result Date: 05/13/2023 CLINICAL DATA:  Unwitnessed fall.  Found on the ground. EXAM: CT CERVICAL SPINE WITHOUT CONTRAST TECHNIQUE: Multidetector CT imaging of the cervical spine was performed without intravenous contrast. Multiplanar CT image reconstructions were also generated. RADIATION DOSE REDUCTION: This exam was performed according to the departmental dose-optimization program which includes automated exposure control, adjustment of the mA and/or kV according to patient size and/or use of iterative reconstruction technique. COMPARISON:  02/22/2021 FINDINGS: Alignment: No traumatic malalignment. Skull base and vertebrae: No regional fracture. Chronic fusion from C2 through C5 of the posterior elements. Soft tissues and spinal canal: No traumatic soft tissue finding. Disc levels: Chronic degenerative spondylosis at C5-6 and C6-7 with bilateral bony foraminal narrowing. Upper chest: Negative Other: None IMPRESSION: No acute or traumatic finding. Chronic fusion from C2 through C5 of the posterior elements. Chronic degenerative spondylosis at C5-6 and C6-7 with bilateral bony foraminal narrowing. Electronically Signed   By: Paulina Fusi M.D.   On: 05/13/2023 10:23   CT Head Wo Contrast Result Date: 05/13/2023 CLINICAL DATA:  Headache, increasing frequency or severity. Unwitnessed fall. Found on the floor. EXAM: CT HEAD WITHOUT CONTRAST TECHNIQUE: Contiguous axial images were obtained from the base of the skull through the vertex without intravenous contrast. RADIATION DOSE REDUCTION: This exam was performed according to the departmental dose-optimization program which includes automated exposure control, adjustment of the mA and/or kV  according to patient size and/or use of iterative reconstruction technique. COMPARISON:  02/22/2021 FINDINGS: Brain: Old left middle cerebral artery territory infarction with encephalomalacia and gliosis. New since November 2022 stroke in the right basal ganglia and radiating white matter tracts, possibly subacute. Chronic small-vessel ischemic changes otherwise affecting the white matter. No sign of mass, hemorrhage, hydrocephalus or extra-axial fluid collection Vascular: There is atherosclerotic calcification of the major vessels at the base of the brain. Skull: Negative Sinuses/Orbits: Mild mucosal inflammatory changes of the sinuses. No advanced finding. Orbits negative. Other: None IMPRESSION: New since November 2022 stroke in the right basal ganglia and radiating white matter tracts, possibly subacute. Old left middle cerebral artery territory infarction with encephalomalacia and gliosis. Chronic small-vessel ischemic changes otherwise affecting the white matter. Electronically Signed   By: Paulina Fusi M.D.   On: 05/13/2023 10:20   DG Chest Port 1 View Result Date: 05/07/2023 CLINICAL DATA:  Weakness EXAM: PORTABLE CHEST 1 VIEW COMPARISON:  X-ray 07/07/2019 FINDINGS: Kyphotic rotated x-ray obscures the right apex. Status post median sternotomy. Normal cardiopericardial silhouette. Tortuous  ectatic aorta. Slight blunting of the left costophrenic angle. Tiny effusion versus pleural thickening. There is some basilar atelectasis. No pneumothorax or edema. Curvature and degenerative changes of the spine. Osteopenia IMPRESSION: Postop chest. Basilar atelectasis or scar. Tiny left pleural effusion or thickening. Rotated kyphotic x-ray obscures the right lung apex. Additional imaging as clinically appropriate Electronically Signed   By: Karen Kays M.D.   On: 05/07/2023 14:44    Microbiology: Results for orders placed or performed during the hospital encounter of 05/07/23  Resp panel by RT-PCR (RSV, Flu A&B,  Covid) Anterior Nasal Swab     Status: None   Collection Time: 05/07/23 12:54 PM   Specimen: Anterior Nasal Swab  Result Value Ref Range Status   SARS Coronavirus 2 by RT PCR NEGATIVE NEGATIVE Final    Comment: (NOTE) SARS-CoV-2 target nucleic acids are NOT DETECTED.  The SARS-CoV-2 RNA is generally detectable in upper respiratory specimens during the acute phase of infection. The lowest concentration of SARS-CoV-2 viral copies this assay can detect is 138 copies/mL. A negative result does not preclude SARS-Cov-2 infection and should not be used as the sole basis for treatment or other patient management decisions. A negative result may occur with  improper specimen collection/handling, submission of specimen other than nasopharyngeal swab, presence of viral mutation(s) within the areas targeted by this assay, and inadequate number of viral copies(<138 copies/mL). A negative result must be combined with clinical observations, patient history, and epidemiological information. The expected result is Negative.  Fact Sheet for Patients:  BloggerCourse.com  Fact Sheet for Healthcare Providers:  SeriousBroker.it  This test is no t yet approved or cleared by the [place] FDA and  has been authorized for detection and/or diagnosis of SARS-CoV-2 by FDA under an Emergency Use Authorization (EUA). This EUA will remain  in effect (meaning this test can be used) for the duration of the COVID-19 declaration under Section 564(b)(1) of the Act, 21 U.S.C.section 360bbb-3(b)(1), unless the authorization is terminated  or revoked sooner.       Influenza A by PCR NEGATIVE NEGATIVE Final   Influenza B by PCR NEGATIVE NEGATIVE Final    Comment: (NOTE) The Xpert Xpress SARS-CoV-2/FLU/RSV plus assay is intended as an aid in the diagnosis of influenza from Nasopharyngeal swab specimens and should not be used as a sole basis for treatment. Nasal  washings and aspirates are unacceptable for Xpert Xpress SARS-CoV-2/FLU/RSV testing.  Fact Sheet for Patients: BloggerCourse.com  Fact Sheet for Healthcare Providers: SeriousBroker.it  This test is not yet approved or cleared by the [place] FDA and has been authorized for detection and/or diagnosis of SARS-CoV-2 by FDA under an Emergency Use Authorization (EUA). This EUA will remain in effect (meaning this test can be used) for the duration of the COVID-19 declaration under Section 564(b)(1) of the Act, 21 U.S.C. section 360bbb-3(b)(1), unless the authorization is terminated or revoked.     Resp Syncytial Virus by PCR NEGATIVE NEGATIVE Final    Comment: (NOTE) Fact Sheet for Patients: BloggerCourse.com  Fact Sheet for Healthcare Providers: SeriousBroker.it  This test is not yet approved or cleared by the [place] FDA and has been authorized for detection and/or diagnosis of SARS-CoV-2 by FDA under an Emergency Use Authorization (EUA). This EUA will remain in effect (meaning this test can be used) for the duration of the COVID-19 declaration under Section 564(b)(1) of the Act, 21 U.S.C. section 360bbb-3(b)(1), unless the authorization is terminated or revoked.  Performed at [facility], [address] [place], [place] [address]   Urine Culture     Status: None   Collection Time: 05/07/23  3:54 PM   Specimen: Urine, Clean Catch  Result Value Ref Range Status   Specimen Description   Final    URINE, CLEAN CATCH Performed at [facility], [address], [place], [place] [address]    Special Requests   Final    NONE Performed at [facility], [address] [place], [place] [address]    Culture   Final    NO GROWTH Performed at [facility], [address] [place], [place] [address]    Report  Status 05/08/2023 FINAL  Final   Labs: CBC: Recent Labs  Lab 05/13/23 1009 05/14/23 0520 05/15/23 0536  WBC 14.2* 8.6 8.3  HGB 14.8 13.5 13.3  HCT 48.7* 43.6 43.2  MCV 86.7 86.0 86.4  PLT 346 328 301   Basic Metabolic Panel: Recent Labs  Lab 05/13/23 1009 05/14/23 0520 05/14/23 1717 05/15/23 0536  NA 146* 147* 145 145  K 3.1* 2.9* 3.2* 3.9  CL 104 107 110 108  CO2 27 28 23 28   GLUCOSE 143* 156* 195* 137*  BUN 19 12 15 13   CREATININE 0.53 0.54 0.77 0.64  CALCIUM 9.3 8.6* 8.8* 8.6*  MG  --  2.0 1.9 2.0   Liver Function Tests: No results for input(s): "AST", "ALT", "ALKPHOS", "BILITOT", "PROT", "ALBUMIN" in the last 168 hours. CBG: No results for input(s): "GLUCAP" in the last 168 hours.  Discharge time spent: greater than 30 minutes.  Author: Lynden Oxford, MD  [facility]

## 2023-05-15 NOTE — Plan of Care (Signed)
  Problem: Education: Goal: Knowledge of disease or condition will improve Outcome: Progressing   Problem: Coping: Goal: Will verbalize positive feelings about self Outcome: Progressing   Problem: Health Behavior/Discharge Planning: Goal: Goals will be collaboratively established with patient/family Outcome: Progressing   Problem: Education: Goal: Knowledge of General Education information will improve Description: Including pain rating scale, medication(s)/side effects and non-pharmacologic comfort measures Outcome: Progressing   Problem: Pain Managment: Goal: General experience of comfort will improve and/or be controlled Outcome: Progressing   Problem: Safety: Goal: Ability to remain free from injury will improve Outcome: Progressing   Problem: Skin Integrity: Goal: Risk for impaired skin integrity will decrease Outcome: Progressing

## 2023-05-15 NOTE — Plan of Care (Signed)

## 2023-05-16 DIAGNOSIS — E785 Hyperlipidemia, unspecified: Secondary | ICD-10-CM | POA: Diagnosis present

## 2023-05-16 DIAGNOSIS — Z515 Encounter for palliative care: Secondary | ICD-10-CM | POA: Diagnosis not present

## 2023-05-16 DIAGNOSIS — N3281 Overactive bladder: Secondary | ICD-10-CM | POA: Diagnosis present

## 2023-05-16 DIAGNOSIS — D75839 Thrombocytosis, unspecified: Secondary | ICD-10-CM | POA: Diagnosis present

## 2023-05-16 DIAGNOSIS — Z951 Presence of aortocoronary bypass graft: Secondary | ICD-10-CM | POA: Diagnosis not present

## 2023-05-16 DIAGNOSIS — K529 Noninfective gastroenteritis and colitis, unspecified: Secondary | ICD-10-CM | POA: Diagnosis not present

## 2023-05-16 DIAGNOSIS — F419 Anxiety disorder, unspecified: Secondary | ICD-10-CM | POA: Diagnosis present

## 2023-05-16 DIAGNOSIS — E66811 Obesity, class 1: Secondary | ICD-10-CM | POA: Diagnosis present

## 2023-05-16 DIAGNOSIS — J690 Pneumonitis due to inhalation of food and vomit: Secondary | ICD-10-CM | POA: Diagnosis present

## 2023-05-16 DIAGNOSIS — R739 Hyperglycemia, unspecified: Secondary | ICD-10-CM | POA: Diagnosis present

## 2023-05-16 DIAGNOSIS — I69351 Hemiplegia and hemiparesis following cerebral infarction affecting right dominant side: Secondary | ICD-10-CM | POA: Diagnosis not present

## 2023-05-16 DIAGNOSIS — I639 Cerebral infarction, unspecified: Secondary | ICD-10-CM | POA: Diagnosis not present

## 2023-05-16 DIAGNOSIS — K219 Gastro-esophageal reflux disease without esophagitis: Secondary | ICD-10-CM | POA: Diagnosis present

## 2023-05-16 DIAGNOSIS — R1310 Dysphagia, unspecified: Secondary | ICD-10-CM | POA: Diagnosis present

## 2023-05-16 DIAGNOSIS — I69320 Aphasia following cerebral infarction: Secondary | ICD-10-CM | POA: Diagnosis not present

## 2023-05-16 DIAGNOSIS — Z7401 Bed confinement status: Secondary | ICD-10-CM | POA: Diagnosis not present

## 2023-05-16 DIAGNOSIS — Z66 Do not resuscitate: Secondary | ICD-10-CM | POA: Diagnosis present

## 2023-05-16 DIAGNOSIS — Z7189 Other specified counseling: Secondary | ICD-10-CM | POA: Diagnosis not present

## 2023-05-16 DIAGNOSIS — E870 Hyperosmolality and hypernatremia: Secondary | ICD-10-CM | POA: Diagnosis present

## 2023-05-16 DIAGNOSIS — J9601 Acute respiratory failure with hypoxia: Secondary | ICD-10-CM | POA: Diagnosis present

## 2023-05-16 DIAGNOSIS — R0989 Other specified symptoms and signs involving the circulatory and respiratory systems: Secondary | ICD-10-CM | POA: Diagnosis not present

## 2023-05-16 DIAGNOSIS — R0689 Other abnormalities of breathing: Secondary | ICD-10-CM | POA: Diagnosis not present

## 2023-05-16 DIAGNOSIS — F32A Depression, unspecified: Secondary | ICD-10-CM | POA: Diagnosis present

## 2023-05-16 DIAGNOSIS — I499 Cardiac arrhythmia, unspecified: Secondary | ICD-10-CM | POA: Diagnosis not present

## 2023-05-16 DIAGNOSIS — Z7902 Long term (current) use of antithrombotics/antiplatelets: Secondary | ICD-10-CM | POA: Diagnosis not present

## 2023-05-16 DIAGNOSIS — I63311 Cerebral infarction due to thrombosis of right middle cerebral artery: Secondary | ICD-10-CM | POA: Diagnosis not present

## 2023-05-16 DIAGNOSIS — M40209 Unspecified kyphosis, site unspecified: Secondary | ICD-10-CM | POA: Diagnosis not present

## 2023-05-16 DIAGNOSIS — K5900 Constipation, unspecified: Secondary | ICD-10-CM | POA: Diagnosis present

## 2023-05-16 DIAGNOSIS — D509 Iron deficiency anemia, unspecified: Secondary | ICD-10-CM | POA: Diagnosis not present

## 2023-05-16 DIAGNOSIS — I2510 Atherosclerotic heart disease of native coronary artery without angina pectoris: Secondary | ICD-10-CM | POA: Diagnosis present

## 2023-05-16 DIAGNOSIS — R4589 Other symptoms and signs involving emotional state: Secondary | ICD-10-CM | POA: Diagnosis not present

## 2023-05-16 DIAGNOSIS — Z79899 Other long term (current) drug therapy: Secondary | ICD-10-CM | POA: Diagnosis not present

## 2023-05-16 DIAGNOSIS — R0602 Shortness of breath: Secondary | ICD-10-CM | POA: Diagnosis not present

## 2023-05-16 DIAGNOSIS — R000 Tachycardia, unspecified: Secondary | ICD-10-CM | POA: Diagnosis not present

## 2023-05-16 DIAGNOSIS — R918 Other nonspecific abnormal finding of lung field: Secondary | ICD-10-CM | POA: Diagnosis not present

## 2023-05-16 DIAGNOSIS — Z7982 Long term (current) use of aspirin: Secondary | ICD-10-CM | POA: Diagnosis not present

## 2023-05-16 DIAGNOSIS — G459 Transient cerebral ischemic attack, unspecified: Secondary | ICD-10-CM | POA: Diagnosis not present

## 2023-05-16 DIAGNOSIS — Z955 Presence of coronary angioplasty implant and graft: Secondary | ICD-10-CM | POA: Diagnosis not present

## 2023-05-16 DIAGNOSIS — Z8249 Family history of ischemic heart disease and other diseases of the circulatory system: Secondary | ICD-10-CM | POA: Diagnosis not present

## 2023-05-16 DIAGNOSIS — I10 Essential (primary) hypertension: Secondary | ICD-10-CM | POA: Diagnosis present

## 2023-05-16 MED ORDER — AMLODIPINE BESYLATE 5 MG PO TABS
5.0000 mg | ORAL_TABLET | Freq: Every day | ORAL | Status: DC
  Administered 2023-05-16: 5 mg via ORAL
  Filled 2023-05-16: qty 1

## 2023-05-16 MED ORDER — METHOCARBAMOL 500 MG PO TABS: 500.0000 mg | ORAL_TABLET | Freq: Three times a day (TID) | ORAL | Status: DC | PRN

## 2023-05-16 MED ORDER — AMLODIPINE BESYLATE 5 MG PO TABS: 5.0000 mg | ORAL_TABLET | Freq: Every day | ORAL | 0 refills | Status: DC

## 2023-05-16 MED ORDER — TRIMETHOPRIM 100 MG PO TABS
100.0000 mg | ORAL_TABLET | Freq: Every evening | ORAL | Status: DC
  Filled 2023-05-16: qty 1

## 2023-05-16 MED ORDER — FERROUS SULFATE 325 (65 FE) MG PO TABS: 325.0000 mg | ORAL_TABLET | ORAL | Status: DC

## 2023-05-16 NOTE — TOC Transition Note (Signed)
Transition of Care [facility]) - Discharge Note   Patient Details  Name: Kristin [name] MRN: [id number] Date of Birth: 1940/09/21  Transition of Care [facility]) CM/SW Contact:  Mearl Latin, LCSW Phone Number: 05/16/2023, 1:59 PM   Clinical Narrative:    Patient will DC to: [facility] Anticipated DC date: 05/16/23 Family notified: Daughter, Web designer by: Sharin Mons   Per MD patient ready for DC to [facility]. RN to call report prior to discharge [phone] room [phone]). RN, patient, patient's family, and facility notified of DC. Discharge Summary and FL2 sent to facility. DC packet on chart including signed DNR. Ambulance transport requested for patient.   CSW will sign off for now as social work intervention is no longer needed. Please consult [place] again if new needs arise.     Final next level of care: Skilled Nursing Facility Barriers to Discharge: Barriers Resolved   Patient Goals and CMS Choice Patient states their goals for this hospitalization and ongoing recovery are:: patient unable to participate in goal setting, not fully oriented CMS Medicare.gov Compare Post Acute Care list provided to:: Patient Represenative (must comment) Choice offered to / list presented to : Adult Children [place] ownership interest in [facility].provided to:: Adult Children    Discharge Placement   Existing PASRR number confirmed : 05/16/23          Patient chooses bed at: [facility] Patient to be transferred to facility by: [facility] Name of family member notified: Daughter Patient and family notified of of transfer: 05/16/23  Discharge Plan and Services Additional resources added to the After Visit Summary for       Post Acute Care Choice: Skilled Nursing Facility                               Social Drivers of Health (SDOH) Interventions SDOH Screenings   Food Insecurity: Patient Unable To Answer (05/14/2023)  Housing: Low Risk  (05/14/2023)  Transportation Needs: No  Transportation Needs (05/14/2023)  Utilities: Patient Unable To Answer (05/14/2023)  Depression (PHQ2-9): Low Risk  (03/10/2023)  Financial Resource Strain: Low Risk  (05/13/2023)  Physical Activity: Unknown (05/13/2023)  Social Connections: Patient Unable To Answer (05/14/2023)  Recent Concern: Social Connections - Socially Isolated (05/13/2023)  Stress: Patient Declined (05/13/2023)  Tobacco Use: Low Risk  (05/13/2023)     Readmission Risk Interventions     No data to display

## 2023-05-16 NOTE — Discharge Summary (Signed)
Physician Discharge Summary   Patient: Kristin [name] MRN: [id number] DOB: 06-18-1940  Admit date:     05/13/2023  Discharge date: 05/16/23  Discharge Physician: Lynden Oxford  PCP: Sharon Seller, NP  Recommendations at discharge:  Follow up with PCP and Neurology    Contact information for follow-up providers     [facility]. Schedule an appointment as soon as possible for a visit in 1 month(s).   Specialty: Neurology Why: stroke clinic Contact information: [address] [place] [address] [phone]        Sharon Seller, NP. Schedule an appointment as soon as possible for a visit in 1 week(s).   Specialty: Geriatric Medicine Why: with BMP lab to look at kidney and electrolytes Contact information: [address] [place] [address] [phone]              Contact information for after-discharge care     Destination     [facility] .   Service: Skilled Nursing Contact information: [address] Test Update Address [place] [address] [phone]                    Discharge Diagnoses: Principal Problem:   CVA (cerebral vascular accident) [facility]) Active Problems:   Dyslipidemia, goal LDL below 70   Spastic hemiplegia affecting dominant side (HCC)   Aphasia as late effect of stroke   Essential hypertension   S/P CABG x 2   Hypokalemia  Hospital Course: PMH of CVA with chronic right-sided hemiplegia, aphasia, HTN, CAD, mood disorder, GERD, HLD, chronic nocturia. Presented with complaints of generalized fatigue and weakness. Workup found to have acute CVA. Neurology was consulted. Pt did not get discharged on 1/23 due to bed availability.   Assessment and Plan: Subacute right carotid head infarct. Likely small vessel disease. CT scan showed right basal ganglia infarct. CT head showed left MCA chronic occlusion and other nonsignificant  occlusions and stenosis. MRI confirms the stroke. LDL 82.  Continue statin. Echocardiogram EF 6065%. Hemoglobin A1c 5.7. On aspirin and Plavix prior to admission.  Currently family agrees to discontinue DDAVP and therefore we will continue aspirin and Plavix. Neurology signed off. PT OT recommending SNF. SLP recommending D2 diet.  HLD. On Lipitor 40. LDL 82. Continue.  HTN. Blood pressure stable. Now on norvasc.  Monitor.  Chronic nocturia. On DDAVP. Also on Myrbetriq as well as estrogen therapy. To minimize the stroke reduction risk, family currently agrees to discontinue DDAVP.  Obesity Class 1 Body mass index is 30.82 kg/m.  Placing the pt at higher risk of poor outcomes.  CAD. Currently no chest pain. On dual antiplatelet therapy.  GErd Switch omeprazole to protonix  Consultants:  Neurology   Procedures performed:  Echocardiogram   DISCHARGE MEDICATION: Allergies as of 05/16/2023   No Known Allergies      Medication List     STOP taking these medications    desmopressin 0.2 MG tablet Commonly known as: DDAVP   omeprazole 20 MG capsule Commonly known as: PRILOSEC Replaced by: pantoprazole 40 MG tablet       TAKE these medications    acetaminophen 500 MG tablet Commonly known as: TYLENOL Take 1,000 mg by mouth 3 (three) times daily as needed for mild pain (pain score 1-3), headache or fever.   amLODipine 5 MG tablet Commonly known as: NORVASC Take 1 tablet (5 mg total) by mouth daily. Start taking on: May 17, 2023   aspirin 81 MG tablet Take 81 mg by mouth in the morning.   atorvastatin 40 MG tablet Commonly known as: LIPITOR tAKE ONE TABLET BY MOUTH DAILY. What changed: when to take this   clopidogrel 75 MG tablet Commonly known as: PLAVIX TAKE ONE TABLET BY MOUTH DAILY What changed: when to take this   COQ-10 PO Take 1 tablet daily by mouth.   diclofenac Sodium 1 % Gel Commonly known as: VOLTAREN APPLY TO THE AFFECTED  AREA(S) THREE TIMES DAILY.   DULoxetine 20 MG capsule Commonly known as: CYMBALTA TAKE ONE CAPSULE BY MOUTH DAILY What changed: when to take this   estradiol 0.1 MG/GM vaginal cream Commonly known as: ESTRACE Place 1 Applicatorful vaginally 3 (three) times a week.   feeding supplement Liqd Take 237 mLs by mouth 2 (two) times daily between meals.   ferrous sulfate 325 (65 FE) MG tablet Take 325 mg by mouth See admin instructions. Take 325 mg by mouth WITH FOOD three times a week   gabapentin 300 MG capsule Commonly known as: NEURONTIN TAKE ONE CAPSULE BY MOUTH THREE TIMES DAILY What changed: when to take this   hydrocortisone 2.5 % rectal cream Commonly known as: ANUSOL-HC Place 1 Application rectally as needed for hemorrhoids.   methocarbamol 500 MG tablet Commonly known as: ROBAXIN Take 1 tablet (500 mg total) by mouth every 8 (eight) hours as needed for muscle spasms.   mirabegron ER 50 MG Tb24 tablet Commonly known as: MYRBETRIQ Take 50 mg by mouth in the morning.   nitroGLYCERIN 0.4 MG SL tablet Commonly known as: Nitrostat Place 1 tablet (0.4 mg total) under the tongue every 5 (five) minutes as needed for chest pain.   pantoprazole 40 MG tablet Commonly known as: PROTONIX Take 1 tablet (40 mg total) by mouth daily. Replaces: omeprazole 20 MG capsule   polyethylene glycol 17 g packet Commonly known as: MIRALAX / GLYCOLAX Take 17 g by mouth daily. What changed:  when to take this reasons to take this   potassium chloride 10 MEQ tablet Commonly known as: KLOR-CON M Take 1 tablet (10 mEq total) by mouth daily.   trimethoprim 100 MG tablet Commonly known as: TRIMPEX Take 100 mg by mouth every evening.   TURMERIC PO Take 1 capsule by mouth daily.   Vitamin D3 50 MCG (2000 UT) capsule Take 2,000 Units by mouth at bedtime.        Disposition: SNF Diet recommendation: Dysphagia type 2 thin Liquid  Discharge Exam: Vitals:   05/16/23 0803 05/16/23  0824 05/16/23 0830 05/16/23 1217  BP: (!) 181/73 (!) 175/95 (!) 175/95 (!) 156/94  Pulse: 63 80 61 65  Resp: 18  18 18   Temp: 98 F (36.7 C) 98.3 F (36.8 C) 98.3 F (36.8 C) 98 F (36.7 C)  TempSrc:  Oral Oral   SpO2: 93% 94%  92%  Weight:      Height:       General: Appear in mild distress; no visible Abnormal Neck Mass Or lumps, Conjunctiva normal Cardiovascular: S1 and S2 Present, no Murmur, Respiratory: good respiratory effort, Bilateral Air entry present and CTA, no Crackles, no wheezes Abdomen: Bowel Sound present, Non tender  Extremities: no Pedal edema Neurology: alert and non verbal answers questions in yes and no. Able to follow all commands. Chronic right sided hemiparesis.  Filed Weights   05/13/23 0858 05/15/23 0516  Weight: 84 kg 85.3 kg   Condition at discharge: stable  The results of  significant diagnostics from this hospitalization (including imaging, microbiology, ancillary and laboratory) are listed below for reference.   Imaging Studies: ECHOCARDIOGRAM COMPLETE Result Date: 05/14/2023    ECHOCARDIOGRAM REPORT   Patient Name:   MARNEE SHERRARD Date of Exam: 05/14/2023 Medical Rec #:  [id number]  Height:       65.0 in Accession #:    [id number] Weight:       185.2 lb Date of Birth:  18-Jun-1940   BSA:          1.914 m Patient Age:    83 years   BP:           156/92 mmHg Patient Gender: F          HR:           94 bpm. Exam Location:  Inpatient Procedure: 2D Echo, Cardiac Doppler, Color Doppler and Intracardiac            Opacification Agent Indications:    Stroke  History:        Patient has prior history of Echocardiogram examinations, most                 recent 07/07/2019. CAD, Stroke and PAD; Risk                 Factors:Hypertension.  Sonographer:    Amy Chionchio Referring Phys: [id number] [facility] [place] IMPRESSIONS  1. Left ventricular ejection fraction, by estimation, is 60 to 65%. The left ventricle has normal function. The left ventricle has no regional wall motion  abnormalities. There is mild left ventricular hypertrophy. Left ventricular diastolic parameters are consistent with Grade I diastolic dysfunction (impaired relaxation).  2. Right ventricular systolic function is mildly reduced. The right ventricular size is normal.  3. The mitral valve is normal in structure. Trivial mitral valve regurgitation.  4. The aortic valve is tricuspid. Aortic valve regurgitation is trivial. Mild aortic valve stenosis. Vmax 2.5 m/s, MG 13 mmHg, DI 0.42 FINDINGS  Left Ventricle: Left ventricular ejection fraction, by estimation, is 60 to 65%. The left ventricle has normal function. The left ventricle has no regional wall motion abnormalities. The left ventricular internal cavity size was normal in size. There is  mild left ventricular hypertrophy. Left ventricular diastolic parameters are consistent with Grade I diastolic dysfunction (impaired relaxation). Right Ventricle: The right ventricular size is normal. No increase in right ventricular wall thickness. Right ventricular systolic function is mildly reduced. Left Atrium: Left atrial size was not well visualized. Right Atrium: Right atrial size was not well visualized. Pericardium: There is no evidence of pericardial effusion. Mitral Valve: The mitral valve is normal in structure. Trivial mitral valve regurgitation. MV peak gradient, 8.1 mmHg. The mean mitral valve gradient is 3.0 mmHg. Tricuspid Valve: The tricuspid valve is normal in structure. Tricuspid valve regurgitation is trivial. Aortic Valve: The aortic valve is tricuspid. Aortic valve regurgitation is trivial. Mild aortic stenosis is present. Aortic valve mean gradient measures 8.2 mmHg. Aortic valve peak gradient measures 14.0 mmHg. Aortic valve area, by VTI measures 1.68 cm. Pulmonic Valve: The pulmonic valve was not well visualized. Pulmonic valve regurgitation is not visualized. Aorta: The aortic root and ascending aorta are structurally normal, with no evidence of  dilitation. IAS/Shunts: The interatrial septum was not well visualized.  LEFT VENTRICLE PLAX 2D LVIDd:         4.30 cm   Diastology LVIDs:         3.10 cm   LV e' medial:  3.70 cm/s LV PW:         1.10 cm   LV E/e' medial:  17.2 LV IVS:        0.90 cm   LV e' lateral:   5.11 cm/s LVOT diam:     2.00 cm   LV E/e' lateral: 12.4 LV SV:         44 LV SV Index:   23 LVOT Area:     3.14 cm  RIGHT VENTRICLE TAPSE (M-mode): 1.2 cm AORTIC VALVE                     PULMONIC VALVE AV Area (Vmax):    1.49 cm      PV Vmax:       0.95 m/s AV Area (Vmean):   1.51 cm      PV Peak grad:  3.6 mmHg AV Area (VTI):     1.68 cm AV Vmax:           187.20 cm/s AV Vmean:          128.420 cm/s AV VTI:            0.261 m AV Peak Grad:      14.0 mmHg AV Mean Grad:      8.2 mmHg LVOT Vmax:         88.65 cm/s LVOT Vmean:        61.850 cm/s LVOT VTI:          0.140 m LVOT/AV VTI ratio: 0.53  AORTA Ao Root diam: 3.00 cm Ao Asc diam:  3.70 cm MITRAL VALVE                TRICUSPID VALVE MV Area (PHT): 4.21 cm     TR Peak grad:   27.2 mmHg MV Area VTI:   2.07 cm     TR Vmax:        261.00 cm/s MV Peak grad:  8.1 mmHg MV Mean grad:  3.0 mmHg     SHUNTS MV Vmax:       1.42 m/s     Systemic VTI:  0.14 m MV Vmean:      76.3 cm/s    Systemic Diam: 2.00 cm MV Decel Time: 180 msec MV E velocity: 63.60 cm/s MV A velocity: 126.00 cm/s MV E/A ratio:  0.50 Epifanio Lesches MD Electronically signed by Epifanio Lesches MD Signature Date/Time: 05/14/2023/1:27:23 PM    Final    CT ANGIO HEAD NECK W WO CM Result Date: 05/13/2023 CLINICAL DATA:  Neuro deficit, acute, stroke suspected. Unwitnessed fall. Found on the floor. Speech disturbance. Acute right basal ganglia infarction by CT. EXAM: CT ANGIOGRAPHY HEAD AND NECK WITH AND WITHOUT CONTRAST TECHNIQUE: Multidetector CT imaging of the head and neck was performed using the standard protocol during bolus administration of intravenous contrast. Multiplanar CT image reconstructions and MIPs were  obtained to evaluate the vascular anatomy. Carotid stenosis measurements (when applicable) are obtained utilizing NASCET criteria, using the distal internal carotid diameter as the denominator. RADIATION DOSE REDUCTION: This exam was performed according to the departmental dose-optimization program which includes automated exposure control, adjustment of the mA and/or kV according to patient size and/or use of iterative reconstruction technique. CONTRAST:  75mL OMNIPAQUE IOHEXOL 350 MG/ML SOLN COMPARISON:  CT and MRI same day FINDINGS: CTA NECK FINDINGS Aortic arch: Previous median sternotomy and CABG. Branching pattern is normal without origin stenosis. Right carotid system: Common carotid artery is tortuous but widely patent  to the bifurcation. Calcified plaque at the carotid bifurcation and ICA bulb. Minimal diameter of the ICA bulb is the same as the more distal cervical ICA, therefore no stenosis. Left carotid system: Common carotid artery is tortuous but widely patent to the bifurcation. Calcified plaque at the carotid bifurcation and ICA bulb. Minimal diameter in proximal bulb measures 1.8 mm. Compared to a more distal cervical ICA diameter of 4.5 mm, this indicates a 60% stenosis. Beyond that, the vessel is patent to the skull base. Vertebral arteries: No proximal subclavian stenosis. Calcified plaque adjacent to the right vertebral artery origin but no stenosis greater than 30%. No left vertebral artery origin stenosis. The vessels are then patent through the cervical region to the foramen magnum. Skeleton: Chronic cervical fusion C2 through C6. Chronic degenerative changes above and below that. Other neck: No mass or lymphadenopathy. Upper chest: Lung apices are clear. Review of the MIP images confirms the above findings CTA HEAD FINDINGS Anterior circulation: Both internal carotid arteries are patent through the skull base and siphon regions. There is siphon atherosclerotic calcification but no stenosis  greater than 50% suspected. On the right, the anterior and middle cerebral vessels are patent. There is some atherosclerotic irregularity of the M1 segment on the right but no flow limiting stenosis. On the left, the anterior cerebral arteries patent. There is chronic occlusion of the left middle cerebral artery. Posterior circulation: Both vertebral arteries are patent through the foramen magnum to the basilar artery. There is atherosclerotic disease of both vertebral artery V4 segments with stenosis estimated at 50% on both sides. The basilar artery shows some atherosclerotic irregularity but no flow limiting stenosis. Posterior circulation branch vessels show flow. Right PCA takes origin from the anterior circulation. There is moderate stenosis of the proximal left P1 segment. Venous sinuses: Patent and normal. Anatomic variants: None significant. Review of the MIP images confirms the above findings IMPRESSION: 1. No acute vascular finding. Right M1 segment show some atherosclerotic irregularity but no flow limiting stenosis. 2. Atherosclerotic disease at both carotid bifurcations. 60% stenosis of the proximal left ICA bulb. No right ICA stenosis. 3. Atherosclerotic disease of both vertebral artery V4 segments with stenosis estimated at 50% on both sides. 4. Atherosclerotic disease of both carotid siphon regions with bilateral stenosis estimated at 50%. 5. 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 6. Moderate stenosis of the proximal left P1 segment. Electronically Signed   By: Paulina Fusi M.D.   On: 05/13/2023 19:06   MR BRAIN WO CONTRAST Result Date: 05/13/2023 CLINICAL DATA:  Neuro deficit, acute, stroke suspected. Unwitnessed fall and confusion. Speech disturbance. EXAM: MRI HEAD WITHOUT CONTRAST TECHNIQUE: Multiplanar, multiecho pulse sequences of the brain and surrounding structures were obtained without intravenous contrast. COMPARISON:  Head CT earlier same day FINDINGS: Brain:  Acute/subacute infarction in the right basal ganglia and regional white matter tracks. Mild swelling but no significant mass effect. No hemorrhage. No other acute infarction. Old left middle cerebral artery territory infarction with atrophy, encephalomalacia and gliosis. Ex vacuo enlargement of the left lateral ventricle. Chronic small-vessel ischemic changes of the white matter of both hemispheres. Chronic small-vessel ischemic changes of the pons. Wallerian degeneration of the brainstem on the left due to the old left MCA stroke. No hydrocephalus. No extra-axial collection. Vascular: Major vessels at the base of the brain show flow. Skull and upper cervical spine: Negative Sinuses/Orbits: Mild seasonal mucosal thickening. Small right mastoid effusion. Orbits negative. Other: None IMPRESSION: 1. Acute/subacute infarction in the right  basal ganglia and regional white matter tracks. Mild swelling but no significant mass effect. No hemorrhage. 2. Old left middle cerebral artery territory infarction with atrophy, encephalomalacia and gliosis. Ex vacuo enlargement of the left lateral ventricle. Wallerian degeneration of the brainstem on the left due to the old left MCA stroke. 3. Chronic small-vessel ischemic changes of the white matter of both hemispheres and of the pons. Electronically Signed   By: Paulina Fusi M.D.   On: 05/13/2023 15:00   DG Chest Portable 1 View Result Date: 05/13/2023 CLINICAL DATA:  Fall. EXAM: PORTABLE CHEST 1 VIEW COMPARISON:  Chest radiograph dated May 07, 2023. FINDINGS: Stable cardiomediastinal silhouette. Prior median sternotomy and CABG. Low lung volumes. Trace left effusion versus pleural thickening. No focal consolidation or pneumothorax. No acute osseous abnormality identified. IMPRESSION: Trace left effusion versus pleural thickening, similar to slightly less prominent compared to the prior exam, which could be somewhat attributable to variations in positioning. Otherwise, no  acute findings in the chest. Electronically Signed   By: Hart Robinsons M.D.   On: 05/13/2023 10:26   DG Knee 2 Views Left Result Date: 05/13/2023 CLINICAL DATA:  Fall and left knee pain. EXAM: LEFT KNEE - 1-2 VIEW COMPARISON:  None Available. FINDINGS: There is no acute fracture or dislocation. The bones are osteopenic. There is severe osteoarthritic changes with tricompartmental narrowing. There is near bone-on-bone contact in the lateral compartment. Small suprapatellar effusion. The soft tissues are unremarkable. Vascular calcifications noted. IMPRESSION: 1. No acute fracture or dislocation. 2. Severe osteoarthritis. Electronically Signed   By: Elgie Collard M.D.   On: 05/13/2023 10:23   CT Cervical Spine Wo Contrast Result Date: 05/13/2023 CLINICAL DATA:  Unwitnessed fall.  Found on the ground. EXAM: CT CERVICAL SPINE WITHOUT CONTRAST TECHNIQUE: Multidetector CT imaging of the cervical spine was performed without intravenous contrast. Multiplanar CT image reconstructions were also generated. RADIATION DOSE REDUCTION: This exam was performed according to the departmental dose-optimization program which includes automated exposure control, adjustment of the mA and/or kV according to patient size and/or use of iterative reconstruction technique. COMPARISON:  02/22/2021 FINDINGS: Alignment: No traumatic malalignment. Skull base and vertebrae: No regional fracture. Chronic fusion from C2 through C5 of the posterior elements. Soft tissues and spinal canal: No traumatic soft tissue finding. Disc levels: Chronic degenerative spondylosis at C5-6 and C6-7 with bilateral bony foraminal narrowing. Upper chest: Negative Other: None IMPRESSION: No acute or traumatic finding. Chronic fusion from C2 through C5 of the posterior elements. Chronic degenerative spondylosis at C5-6 and C6-7 with bilateral bony foraminal narrowing. Electronically Signed   By: Paulina Fusi M.D.   On: 05/13/2023 10:23   CT Head Wo  Contrast Result Date: 05/13/2023 CLINICAL DATA:  Headache, increasing frequency or severity. Unwitnessed fall. Found on the floor. EXAM: CT HEAD WITHOUT CONTRAST TECHNIQUE: Contiguous axial images were obtained from the base of the skull through the vertex without intravenous contrast. RADIATION DOSE REDUCTION: This exam was performed according to the departmental dose-optimization program which includes automated exposure control, adjustment of the mA and/or kV according to patient size and/or use of iterative reconstruction technique. COMPARISON:  02/22/2021 FINDINGS: Brain: Old left middle cerebral artery territory infarction with encephalomalacia and gliosis. New since November 2022 stroke in the right basal ganglia and radiating white matter tracts, possibly subacute. Chronic small-vessel ischemic changes otherwise affecting the white matter. No sign of mass, hemorrhage, hydrocephalus or extra-axial fluid collection Vascular: There is atherosclerotic calcification of the major vessels at the base of the  brain. Skull: Negative Sinuses/Orbits: Mild mucosal inflammatory changes of the sinuses. No advanced finding. Orbits negative. Other: None IMPRESSION: New since November 2022 stroke in the right basal ganglia and radiating white matter tracts, possibly subacute. Old left middle cerebral artery territory infarction with encephalomalacia and gliosis. Chronic small-vessel ischemic changes otherwise affecting the white matter. Electronically Signed   By: Paulina Fusi M.D.   On: 05/13/2023 10:20   DG Chest Port 1 View Result Date: 05/07/2023 CLINICAL DATA:  Weakness EXAM: PORTABLE CHEST 1 VIEW COMPARISON:  X-ray 07/07/2019 FINDINGS: Kyphotic rotated x-ray obscures the right apex. Status post median sternotomy. Normal cardiopericardial silhouette. Tortuous ectatic aorta. Slight blunting of the left costophrenic angle. Tiny effusion versus pleural thickening. There is some basilar atelectasis. No pneumothorax or  edema. Curvature and degenerative changes of the spine. Osteopenia IMPRESSION: Postop chest. Basilar atelectasis or scar. Tiny left pleural effusion or thickening. Rotated kyphotic x-ray obscures the right lung apex. Additional imaging as clinically appropriate Electronically Signed   By: Karen Kays M.D.   On: 05/07/2023 14:44    Microbiology: Results for orders placed or performed during the hospital encounter of 05/07/23  Resp panel by RT-PCR (RSV, Flu A&B, Covid) Anterior Nasal Swab     Status: None   Collection Time: 05/07/23 12:54 PM   Specimen: Anterior Nasal Swab  Result Value Ref Range Status   SARS Coronavirus 2 by RT PCR NEGATIVE NEGATIVE Final    Comment: (NOTE) SARS-CoV-2 target nucleic acids are NOT DETECTED.  The SARS-CoV-2 RNA is generally detectable in upper respiratory specimens during the acute phase of infection. The lowest concentration of SARS-CoV-2 viral copies this assay can detect is 138 copies/mL. A negative result does not preclude SARS-Cov-2 infection and should not be used as the sole basis for treatment or other patient management decisions. A negative result may occur with  improper specimen collection/handling, submission of specimen other than nasopharyngeal swab, presence of viral mutation(s) within the areas targeted by this assay, and inadequate number of viral copies(<138 copies/mL). A negative result must be combined with clinical observations, patient history, and epidemiological information. The expected result is Negative.  Fact Sheet for Patients:  BloggerCourse.com  Fact Sheet for Healthcare Providers:  SeriousBroker.it  This test is no t yet approved or cleared by the [place] FDA and  has been authorized for detection and/or diagnosis of SARS-CoV-2 by FDA under an Emergency Use Authorization (EUA). This EUA will remain  in effect (meaning this test can be used) for the duration of  the COVID-19 declaration under Section 564(b)(1) of the Act, 21 U.S.C.section 360bbb-3(b)(1), unless the authorization is terminated  or revoked sooner.       Influenza A by PCR NEGATIVE NEGATIVE Final   Influenza B by PCR NEGATIVE NEGATIVE Final    Comment: (NOTE) The Xpert Xpress SARS-CoV-2/FLU/RSV plus assay is intended as an aid in the diagnosis of influenza from Nasopharyngeal swab specimens and should not be used as a sole basis for treatment. Nasal washings and aspirates are unacceptable for Xpert Xpress SARS-CoV-2/FLU/RSV testing.  Fact Sheet for Patients: BloggerCourse.com  Fact Sheet for Healthcare Providers: SeriousBroker.it  This test is not yet approved or cleared by the [place] FDA and has been authorized for detection and/or diagnosis of SARS-CoV-2 by FDA under an Emergency Use Authorization (EUA). This EUA will remain in effect (meaning this test can be used) for the duration of the COVID-19 declaration under Section 564(b)(1) of the Act, 21 U.S.C. section 360bbb-3(b)(1), unless the authorization  is terminated or revoked.     Resp Syncytial Virus by PCR NEGATIVE NEGATIVE Final    Comment: (NOTE) Fact Sheet for Patients: BloggerCourse.com  Fact Sheet for Healthcare Providers: SeriousBroker.it  This test is not yet approved or cleared by the [place] FDA and has been authorized for detection and/or diagnosis of SARS-CoV-2 by FDA under an Emergency Use Authorization (EUA). This EUA will remain in effect (meaning this test can be used) for the duration of the COVID-19 declaration under Section 564(b)(1) of the Act, 21 U.S.C. section 360bbb-3(b)(1), unless the authorization is terminated or revoked.  Performed at [facility], [address] [place], [place] [address]   Urine Culture     Status: None   Collection Time: 05/07/23   3:54 PM   Specimen: Urine, Clean Catch  Result Value Ref Range Status   Specimen Description   Final    URINE, CLEAN CATCH Performed at [facility], [address], [place], [place] [address]    Special Requests   Final    NONE Performed at [facility], [address] [place], [place] [address]    Culture   Final    NO GROWTH Performed at [facility], [address], [place], [place] [address]    Report Status 05/08/2023 FINAL  Final   Labs: CBC: Recent Labs  Lab 05/13/23 1009 05/14/23 0520 05/15/23 0536  WBC 14.2* 8.6 8.3  HGB 14.8 13.5 13.3  HCT 48.7* 43.6 43.2  MCV 86.7 86.0 86.4  PLT 346 328 301   Basic Metabolic Panel: Recent Labs  Lab 05/13/23 1009 05/14/23 0520 05/14/23 1717 05/15/23 0536  NA 146* 147* 145 145  K 3.1* 2.9* 3.2* 3.9  CL 104 107 110 108  CO2 27 28 23 28   GLUCOSE 143* 156* 195* 137*  BUN 19 12 15 13   CREATININE 0.53 0.54 0.77 0.64  CALCIUM 9.3 8.6* 8.8* 8.6*  MG  --  2.0 1.9 2.0   Liver Function Tests: No results for input(s): "AST", "ALT", "ALKPHOS", "BILITOT", "PROT", "ALBUMIN" in the last 168 hours. CBG: No results for input(s): "GLUCAP" in the last 168 hours.  Discharge time spent: greater than 30 minutes.  Author: Lynden Oxford, MD  [facility]

## 2023-05-16 NOTE — Progress Notes (Signed)
Patient discharged by [facility] to [facility] (SNF)

## 2023-05-16 NOTE — TOC Progression Note (Addendum)
Transition of Care [facility]) - Progression Note    Patient Details  Name: Kristin [name] MRN: [id number] Date of Birth: 1940/05/15  Transition of Care [facility]) CM/SW Contact  Mearl Latin, LCSW Phone Number: 05/16/2023, 9:28 AM  Clinical Narrative:    9:28am-Awaiting confirmation from [facility] that they can accept patient today.   Insurance approval for [employer], Ref# [id number], Auth ID # [id number], effective 05/15/2023-05/19/2023.   12:17 PM-[facility] able to accept patient today. MSW Intern spoke with patient's daughter, [name], and she requested [facility] to transport patient.    Expected Discharge Plan: Skilled Nursing Facility Barriers to Discharge: Continued Medical Work up, English as a second language teacher  Expected Discharge Plan and Services     Post Acute Care Choice: Skilled Nursing Facility Living arrangements for the past 2 months: Independent Living Facility Expected Discharge Date: 05/15/23                                     Social Determinants of Health (SDOH) Interventions SDOH Screenings   Food Insecurity: Patient Unable To Answer (05/14/2023)  Housing: Low Risk  (05/14/2023)  Transportation Needs: No Transportation Needs (05/14/2023)  Utilities: Patient Unable To Answer (05/14/2023)  Depression (PHQ2-9): Low Risk  (03/10/2023)  Financial Resource Strain: Low Risk  (05/13/2023)  Physical Activity: Unknown (05/13/2023)  Social Connections: Patient Unable To Answer (05/14/2023)  Recent Concern: Social Connections - Socially Isolated (05/13/2023)  Stress: Patient Declined (05/13/2023)  Tobacco Use: Low Risk  (05/13/2023)    Readmission Risk Interventions     No data to display

## 2023-05-16 NOTE — Plan of Care (Signed)

## 2023-05-19 DIAGNOSIS — I2510 Atherosclerotic heart disease of native coronary artery without angina pectoris: Secondary | ICD-10-CM | POA: Diagnosis not present

## 2023-05-19 DIAGNOSIS — D509 Iron deficiency anemia, unspecified: Secondary | ICD-10-CM | POA: Diagnosis not present

## 2023-05-19 DIAGNOSIS — I10 Essential (primary) hypertension: Secondary | ICD-10-CM | POA: Diagnosis not present

## 2023-05-19 DIAGNOSIS — E785 Hyperlipidemia, unspecified: Secondary | ICD-10-CM | POA: Diagnosis not present

## 2023-05-21 ENCOUNTER — Emergency Department (HOSPITAL_COMMUNITY): Payer: Medicare PPO

## 2023-05-21 ENCOUNTER — Inpatient Hospital Stay (HOSPITAL_COMMUNITY)
Admission: EM | Admit: 2023-05-21 | Discharge: 2023-05-23 | DRG: 177 | Disposition: A | Discharge status: Hospice | Payer: Medicare PPO | Source: Skilled Nursing Facility | Attending: Internal Medicine | Admitting: Internal Medicine

## 2023-05-21 DIAGNOSIS — M40209 Unspecified kyphosis, site unspecified: Secondary | ICD-10-CM | POA: Diagnosis not present

## 2023-05-21 DIAGNOSIS — E66811 Obesity, class 1: Secondary | ICD-10-CM | POA: Diagnosis present

## 2023-05-21 DIAGNOSIS — K5900 Constipation, unspecified: Secondary | ICD-10-CM | POA: Diagnosis present

## 2023-05-21 DIAGNOSIS — R739 Hyperglycemia, unspecified: Secondary | ICD-10-CM | POA: Diagnosis not present

## 2023-05-21 DIAGNOSIS — J690 Pneumonitis due to inhalation of food and vomit: Principal | ICD-10-CM | POA: Diagnosis present

## 2023-05-21 DIAGNOSIS — E870 Hyperosmolality and hypernatremia: Secondary | ICD-10-CM | POA: Diagnosis present

## 2023-05-21 DIAGNOSIS — I639 Cerebral infarction, unspecified: Secondary | ICD-10-CM | POA: Diagnosis not present

## 2023-05-21 DIAGNOSIS — R0989 Other specified symptoms and signs involving the circulatory and respiratory systems: Secondary | ICD-10-CM | POA: Diagnosis not present

## 2023-05-21 DIAGNOSIS — Z515 Encounter for palliative care: Secondary | ICD-10-CM | POA: Diagnosis not present

## 2023-05-21 DIAGNOSIS — R000 Tachycardia, unspecified: Secondary | ICD-10-CM | POA: Diagnosis not present

## 2023-05-21 DIAGNOSIS — F32A Depression, unspecified: Secondary | ICD-10-CM | POA: Diagnosis present

## 2023-05-21 DIAGNOSIS — D75839 Thrombocytosis, unspecified: Secondary | ICD-10-CM | POA: Diagnosis present

## 2023-05-21 DIAGNOSIS — R4589 Other symptoms and signs involving emotional state: Secondary | ICD-10-CM

## 2023-05-21 DIAGNOSIS — R0602 Shortness of breath: Secondary | ICD-10-CM | POA: Diagnosis not present

## 2023-05-21 DIAGNOSIS — J9601 Acute respiratory failure with hypoxia: Principal | ICD-10-CM | POA: Diagnosis present

## 2023-05-21 DIAGNOSIS — E785 Hyperlipidemia, unspecified: Secondary | ICD-10-CM | POA: Diagnosis present

## 2023-05-21 DIAGNOSIS — K219 Gastro-esophageal reflux disease without esophagitis: Secondary | ICD-10-CM | POA: Diagnosis present

## 2023-05-21 DIAGNOSIS — N3281 Overactive bladder: Secondary | ICD-10-CM | POA: Diagnosis present

## 2023-05-21 DIAGNOSIS — I69320 Aphasia following cerebral infarction: Secondary | ICD-10-CM | POA: Diagnosis not present

## 2023-05-21 DIAGNOSIS — Z79899 Other long term (current) drug therapy: Secondary | ICD-10-CM | POA: Diagnosis not present

## 2023-05-21 DIAGNOSIS — R1310 Dysphagia, unspecified: Secondary | ICD-10-CM | POA: Diagnosis present

## 2023-05-21 DIAGNOSIS — Z7982 Long term (current) use of aspirin: Secondary | ICD-10-CM

## 2023-05-21 DIAGNOSIS — I69391 Dysphagia following cerebral infarction: Secondary | ICD-10-CM

## 2023-05-21 DIAGNOSIS — I10 Essential (primary) hypertension: Secondary | ICD-10-CM | POA: Diagnosis not present

## 2023-05-21 DIAGNOSIS — I499 Cardiac arrhythmia, unspecified: Secondary | ICD-10-CM | POA: Diagnosis not present

## 2023-05-21 DIAGNOSIS — F419 Anxiety disorder, unspecified: Secondary | ICD-10-CM | POA: Diagnosis present

## 2023-05-21 DIAGNOSIS — Z87442 Personal history of urinary calculi: Secondary | ICD-10-CM

## 2023-05-21 DIAGNOSIS — Z955 Presence of coronary angioplasty implant and graft: Secondary | ICD-10-CM

## 2023-05-21 DIAGNOSIS — Z7401 Bed confinement status: Secondary | ICD-10-CM

## 2023-05-21 DIAGNOSIS — R0689 Other abnormalities of breathing: Secondary | ICD-10-CM | POA: Diagnosis not present

## 2023-05-21 DIAGNOSIS — Z6831 Body mass index (BMI) 31.0-31.9, adult: Secondary | ICD-10-CM

## 2023-05-21 DIAGNOSIS — Z66 Do not resuscitate: Secondary | ICD-10-CM | POA: Diagnosis not present

## 2023-05-21 DIAGNOSIS — Z8249 Family history of ischemic heart disease and other diseases of the circulatory system: Secondary | ICD-10-CM

## 2023-05-21 DIAGNOSIS — Z90710 Acquired absence of both cervix and uterus: Secondary | ICD-10-CM

## 2023-05-21 DIAGNOSIS — Z7189 Other specified counseling: Secondary | ICD-10-CM | POA: Diagnosis not present

## 2023-05-21 DIAGNOSIS — Z7902 Long term (current) use of antithrombotics/antiplatelets: Secondary | ICD-10-CM | POA: Diagnosis not present

## 2023-05-21 DIAGNOSIS — I69351 Hemiplegia and hemiparesis following cerebral infarction affecting right dominant side: Secondary | ICD-10-CM

## 2023-05-21 DIAGNOSIS — Z951 Presence of aortocoronary bypass graft: Secondary | ICD-10-CM

## 2023-05-21 DIAGNOSIS — I2510 Atherosclerotic heart disease of native coronary artery without angina pectoris: Secondary | ICD-10-CM | POA: Diagnosis present

## 2023-05-21 DIAGNOSIS — R918 Other nonspecific abnormal finding of lung field: Secondary | ICD-10-CM | POA: Diagnosis not present

## 2023-05-21 LAB — CBC WITH DIFFERENTIAL/PLATELET
Abs Immature Granulocytes: 0.16 10*3/uL — ABNORMAL HIGH (ref 0.00–0.07)
Basophils Absolute: 0.1 10*3/uL (ref 0.0–0.1)
Basophils Relative: 0 %
Eosinophils Absolute: 0.1 10*3/uL (ref 0.0–0.5)
Eosinophils Relative: 0 %
HCT: 53.3 % — ABNORMAL HIGH (ref 36.0–46.0)
Hemoglobin: 15.8 g/dL — ABNORMAL HIGH (ref 12.0–15.0)
Immature Granulocytes: 1 %
Lymphocytes Relative: 3 %
Lymphs Abs: 0.5 10*3/uL — ABNORMAL LOW (ref 0.7–4.0)
MCH: 26.2 pg (ref 26.0–34.0)
MCHC: 29.6 g/dL — ABNORMAL LOW (ref 30.0–36.0)
MCV: 88.2 fL (ref 80.0–100.0)
Monocytes Absolute: 1.4 10*3/uL — ABNORMAL HIGH (ref 0.1–1.0)
Monocytes Relative: 9 %
Neutro Abs: 13.4 10*3/uL — ABNORMAL HIGH (ref 1.7–7.7)
Neutrophils Relative %: 87 %
Platelets: 413 10*3/uL — ABNORMAL HIGH (ref 150–400)
RBC: 6.04 MIL/uL — ABNORMAL HIGH (ref 3.87–5.11)
RDW: 15.5 % (ref 11.5–15.5)
WBC: 15.5 10*3/uL — ABNORMAL HIGH (ref 4.0–10.5)
nRBC: 0 % (ref 0.0–0.2)

## 2023-05-21 LAB — BASIC METABOLIC PANEL
Anion gap: 13 (ref 5–15)
BUN: 30 mg/dL — ABNORMAL HIGH (ref 8–23)
CO2: 25 mmol/L (ref 22–32)
Calcium: 9.3 mg/dL (ref 8.9–10.3)
Chloride: 107 mmol/L (ref 98–111)
Creatinine, Ser: 0.99 mg/dL (ref 0.44–1.00)
GFR, Estimated: 56 mL/min — ABNORMAL LOW (ref >60)
Glucose, Bld: 213 mg/dL — ABNORMAL HIGH (ref 70–99)
Potassium: 4.5 mmol/L (ref 3.5–5.1)
Sodium: 145 mmol/L (ref 135–145)

## 2023-05-21 LAB — TROPONIN I (HIGH SENSITIVITY)
Troponin I (High Sensitivity): 14 ng/L (ref <18)
Troponin I (High Sensitivity): 14 ng/L (ref <18)

## 2023-05-21 LAB — RESP PANEL BY RT-PCR (RSV, FLU A&B, COVID)  RVPGX2
Influenza A by PCR: NEGATIVE
Influenza B by PCR: NEGATIVE
Resp Syncytial Virus by PCR: NEGATIVE
SARS Coronavirus 2 by RT PCR: NEGATIVE

## 2023-05-21 LAB — BLOOD GAS, VENOUS
Acid-Base Excess: 9 mmol/L — ABNORMAL HIGH (ref 0.0–2.0)
Bicarbonate: 33.5 mmol/L — ABNORMAL HIGH (ref 20.0–28.0)
O2 Saturation: 87.5 %
Patient temperature: 37
pCO2, Ven: 44 mm[Hg] (ref 44–60)
pH, Ven: 7.49 — ABNORMAL HIGH (ref 7.25–7.43)
pO2, Ven: 51 mm[Hg] — ABNORMAL HIGH (ref 32–45)

## 2023-05-21 LAB — BRAIN NATRIURETIC PEPTIDE: B Natriuretic Peptide: 60 pg/mL (ref 0.0–100.0)

## 2023-05-21 MED ORDER — ONDANSETRON HCL 4 MG PO TABS
4.0000 mg | ORAL_TABLET | Freq: Four times a day (QID) | ORAL | Status: DC | PRN
Start: 2023-05-21 — End: 2023-05-23

## 2023-05-21 MED ORDER — SODIUM CHLORIDE 0.9 % IV SOLN
3.0000 g | Freq: Once | INTRAVENOUS | Status: AC
  Administered 2023-05-21: 3 g via INTRAVENOUS
  Filled 2023-05-21: qty 8

## 2023-05-21 MED ORDER — POLYETHYLENE GLYCOL 3350 17 G PO PACK: 17.0000 g | PACK | Freq: Every day | ORAL | Status: DC

## 2023-05-21 MED ORDER — FENTANYL CITRATE PF 50 MCG/ML IJ SOSY
25.0000 ug | PREFILLED_SYRINGE | Freq: Once | INTRAMUSCULAR | Status: AC
  Administered 2023-05-21: 25 ug via INTRAVENOUS
  Filled 2023-05-21: qty 1

## 2023-05-21 MED ORDER — ACETAMINOPHEN 650 MG RE SUPP: 650.0000 mg | Freq: Four times a day (QID) | RECTAL | Status: DC | PRN

## 2023-05-21 MED ORDER — ACETAMINOPHEN 325 MG PO TABS: 650.0000 mg | ORAL_TABLET | Freq: Four times a day (QID) | ORAL | Status: DC | PRN

## 2023-05-21 MED ORDER — ONDANSETRON HCL 4 MG/2ML IJ SOLN: 4.0000 mg | Freq: Four times a day (QID) | INTRAMUSCULAR | Status: DC | PRN

## 2023-05-21 MED ORDER — SENNOSIDES-DOCUSATE SODIUM 8.6-50 MG PO TABS: 1.0000 | ORAL_TABLET | Freq: Every evening | ORAL | Status: DC | PRN

## 2023-05-21 MED ORDER — BISACODYL 10 MG RE SUPP
10.0000 mg | Freq: Once | RECTAL | Status: AC
  Administered 2023-05-21: 10 mg via RECTAL
  Filled 2023-05-21: qty 1

## 2023-05-21 MED ORDER — ENOXAPARIN SODIUM 40 MG/0.4ML IJ SOSY
40.0000 mg | PREFILLED_SYRINGE | INTRAMUSCULAR | Status: DC
  Administered 2023-05-21: 40 mg via SUBCUTANEOUS
  Filled 2023-05-21: qty 0.4

## 2023-05-21 NOTE — ED Notes (Signed)
edh

## 2023-05-21 NOTE — ED Triage Notes (Signed)
Per EMS from [place]. Staff on scene reported aspiration of food. Staff suctioned what they could. Labored breathing. Albuterol on the truck.  CBG 299 T 99.9 BP 154/84 RR 40

## 2023-05-21 NOTE — ED Provider Notes (Signed)
[place] EMERGENCY DEPARTMENT AT [facility] Provider Note   CSN: [id number] Arrival date & time: 05/21/23  1356     History  Chief Complaint  Patient presents with   Shortness of Breath    Kristin [name] is a 83 y.o. female with a history of chronic right-sided hemiaplasia and stroke, hypertension, mood disorder, reflux, presenting to the ED from her nursing facility with concern for hypoxia, labored breathing.  There was reported concern for aspiration per EMS report.  Patient herself is unable to provide much further history on arrival.  She does arrive with a DNR form signed at the bedside.  Most recent hospitalization discharge was 5 days ago where the patient was found to have a new acute stroke.  She had been on aspirin and Plavix prior to admission.  This was continued at the time of discharge.  Her step daughter Deboraha Sprang reports patient is aphasic from old stroke, can nod yes/no appropriately to questions. Deboraha Sprang is her stepdaughter and medical PoA.  Per my discussion with her family, the patient is DNR/DNI, no heroic measures, no vasopressors or ICU admission.  They are in agreement to attempt BiPAP and IV antibiotics and fluids.  They are requesting palliative care consultation, and says that if the patient were to decline they would strongly consider comfort care in keeping with her wishes.  They do not want her to suffer go through unnecessary discomfort.  They report a precipitous decline in her quality of life in the past 2 months.  HPI     Home Medications Prior to Admission medications   Medication Sig Start Date End Date Taking? Authorizing Provider  acetaminophen (TYLENOL) 500 MG tablet Take 1,000 mg by mouth 3 (three) times daily as needed for mild pain (pain score 1-3), headache or fever.    [provider]  amLODipine (NORVASC) 5 MG tablet Take 1 tablet (5 mg total) by mouth daily. 05/17/23   Rolly Salter, MD  aspirin 81 MG  tablet Take 81 mg by mouth in the morning.    [provider]  atorvastatin (LIPITOR) 40 MG tablet tAKE ONE TABLET BY MOUTH DAILY. Patient taking differently: Take 40 mg by mouth every evening. 03/04/23   Sharon Seller, NP  Cholecalciferol (VITAMIN D3) 50 MCG (2000 UT) capsule Take 2,000 Units by mouth at bedtime.    [provider]  clopidogrel (PLAVIX) 75 MG tablet TAKE ONE TABLET BY MOUTH DAILY Patient taking differently: Take 75 mg by mouth in the morning. 09/02/22   Sharon Seller, NP  Coenzyme Q10 (COQ-10 PO) Take 1 tablet daily by mouth.    [provider]  diclofenac Sodium (VOLTAREN) 1 % GEL APPLY TO THE AFFECTED AREA(S) THREE TIMES DAILY. Patient not taking: Reported on 05/13/2023 02/13/21   Horton Chin, MD  DULoxetine (CYMBALTA) 20 MG capsule TAKE ONE CAPSULE BY MOUTH DAILY Patient taking differently: Take 20 mg by mouth in the morning. 12/04/22   Sharon Seller, NP  estradiol (ESTRACE) 0.1 MG/GM vaginal cream Place 1 Applicatorful vaginally 3 (three) times a week. 03/02/20   [provider]  feeding supplement (ENSURE ENLIVE / ENSURE PLUS) LIQD Take 237 mLs by mouth 2 (two) times daily between meals. 05/15/23   Rolly Salter, MD  ferrous sulfate 325 (65 FE) MG tablet Take 325 mg by mouth See admin instructions. Take 325 mg by mouth WITH FOOD three times a week    [provider]  gabapentin (  NEURONTIN) 300 MG capsule TAKE ONE CAPSULE BY MOUTH THREE TIMES DAILY Patient taking differently: Take 300 mg by mouth in the morning, at noon, and at bedtime. 12/04/22   Raulkar, Drema Pry, MD  hydrocortisone (ANUSOL-HC) 2.5 % rectal cream Place 1 Application rectally as needed for hemorrhoids. Patient not taking: Reported on 05/13/2023 08/12/22   Iva Boop, MD  methocarbamol (ROBAXIN) 500 MG tablet Take 1 tablet (500 mg total) by mouth every 8 (eight) hours as needed for muscle spasms. 03/30/21   Ngetich, Dinah C, NP  mirabegron ER  (MYRBETRIQ) 50 MG TB24 tablet Take 50 mg by mouth in the morning. 08/25/20   [provider]  nitroGLYCERIN (NITROSTAT) 0.4 MG SL tablet Place 1 tablet (0.4 mg total) under the tongue every 5 (five) minutes as needed for chest pain. 10/30/21   Sharon Seller, NP  pantoprazole (PROTONIX) 40 MG tablet Take 1 tablet (40 mg total) by mouth daily. 05/16/23   Rolly Salter, MD  polyethylene glycol (MIRALAX / GLYCOLAX) 17 g packet Take 17 g by mouth daily. Patient taking differently: Take 17 g by mouth daily as needed for mild constipation. 05/24/22   Albertine Grates, MD  potassium chloride (KLOR-CON M) 10 MEQ tablet Take 1 tablet (10 mEq total) by mouth daily. 05/16/23   Rolly Salter, MD  trimethoprim (TRIMPEX) 100 MG tablet Take 100 mg by mouth every evening. 04/29/22   [provider]  TURMERIC PO Take 1 capsule by mouth daily.    [provider]      Allergies    Patient has no known allergies.    Review of Systems   Review of Systems  Physical Exam Updated Vital Signs BP (!) 126/98   Pulse (!) 38   Temp (!) 97.3 F (36.3 C) (Axillary)   Resp (!) 36   SpO2 95%  Physical Exam Constitutional:      General: She is not in acute distress. HENT:     Head: Normocephalic and atraumatic.  Eyes:     Conjunctiva/sclera: Conjunctivae normal.     Pupils: Pupils are equal, round, and reactive to light.  Cardiovascular:     Rate and Rhythm: Normal rate and regular rhythm.  Pulmonary:     Effort: Pulmonary effort is normal. No respiratory distress.     Comments: Rhonchi in the lower lobes Abdominal:     General: There is no distension.     Tenderness: There is no abdominal tenderness.  Skin:    General: Skin is warm and dry.  Neurological:     General: No focal deficit present.     Mental Status: She is alert. Mental status is at baseline.  Psychiatric:        Mood and Affect: Mood normal.        Behavior: Behavior normal.     ED Results / Procedures / Treatments    Labs (all labs ordered are listed, but only abnormal results are displayed) Labs Reviewed  RESP PANEL BY RT-PCR (RSV, FLU A&B, COVID)  [id number]  BASIC METABOLIC PANEL  CBC WITH DIFFERENTIAL/PLATELET  BRAIN NATRIURETIC PEPTIDE  URINALYSIS, ROUTINE W REFLEX MICROSCOPIC  TROPONIN I (HIGH SENSITIVITY)    EKG EKG Interpretation Date/Time:  Wednesday May 21 2023 15:23:24 EST Ventricular Rate:  112 PR Interval:  136 QRS Duration:  84 QT Interval:  319 QTC Calculation: 436 R Axis:   201  Text Interpretation: Right and left arm electrode reversal, interpretation assumes no reversal Sinus or ectopic  atrial tachycardia Atrial premature complex Confirmed by Alvester Chou [phone]) on 05/21/2023 3:36:19 PM  Radiology No results found.  Procedures .Critical Care  Performed by: Terald Sleeper, MD Authorized by: Terald Sleeper, MD   Critical care provider statement:    Critical care time (minutes):  40   Critical care time was exclusive of:  Separately billable procedures and treating other patients   Critical care was necessary to treat or prevent imminent or life-threatening deterioration of the following conditions:  Respiratory failure   Critical care was time spent personally by me on the following activities:  Ordering and performing treatments and interventions, ordering and review of laboratory studies, ordering and review of radiographic studies, pulse oximetry, review of old charts, examination of patient and evaluation of patient's response to treatment Comments:     Bipap for tachypnea, respiratory distress Goals of care discussion with family     Medications Ordered in ED Medications - No data to display  ED Course/ Medical Decision Making/ A&P                                 Medical Decision Making Amount and/or Complexity of Data Reviewed Labs: ordered. Radiology: ordered. ECG/medicine tests: ordered.   This patient presents to the ED with concern for  shortness of breath, tachypnea. This involves an extensive number of treatment options, and is a complaint that carries with it a high risk of complications and morbidity.  The differential diagnosis includes aspiration pneumonia versus oral effusion versus viral URI versus other  Co-morbidities that complicate the patient evaluation: History of large stroke, at risk of aspiration  Additional history obtained from EMS, family members  External records from outside source obtained and reviewed including recent hospital discharge summary  I ordered and personally interpreted labs.  The pertinent results include: Pending at the time of signout  I ordered imaging studies including x-ray of the chest, which was pending at the time of signout  The patient was maintained on a cardiac monitor.  I personally viewed and interpreted the cardiac monitored which showed an underlying rhythm of: Tachycardia  Per my interpretation the patient's ECG shows tachycardic  I ordered medication including BiPAP respiratory distress and labored breathing  Test Considered: Lower suspicion for acute PE in this clinical setting.  Social Determinants of Health: Goals of care discussion confirmed with the patient's stepdaughter who is her power of attorney  Dispostion:  340 pm - Patient is signed out to Dr Glendora Score EDP pending medical admission, likely antibiotics for aspiration PNA.- completing of ED workup.         Final Clinical Impression(s) / ED Diagnoses Final diagnoses:  None    Rx / DC Orders ED Discharge Orders     None         Terald Sleeper, MD 05/21/23 1540

## 2023-05-21 NOTE — ED Notes (Signed)
..ED TO INPATIENT HANDOFF REPORT  ED Nurse Name and Phone #: [phone]  S Name/Age/Gender Dyann Ruddle 83 y.o. female Room/Bed: WA01/WA01  Code Status   Code Status: Limited: Do not attempt resuscitation (DNR) -DNR-LIMITED -Do Not Intubate/DNI   Home/SNF/Other Nursing Home Patient oriented to: self and place Is this baseline? Yes   Triage Complete: Triage complete  Chief Complaint Acute respiratory failure with hypoxia (HCC) [J96.01]  Triage Note Per EMS from [facility]. Staff on scene reported aspiration of food. Staff suctioned what they could. Labored breathing. Albuterol on the truck.  CBG 299 T 99.9 BP 154/84 RR 40    Allergies No Known Allergies  Level of Care/Admitting Diagnosis ED Disposition     ED Disposition  Admit   Condition  --   Comment  Hospital Area: [facility] [[id number]]  Level of Care: Stepdown [14]  Admit to SDU based on following criteria: Respiratory Distress:  Frequent assessment and/or intervention to maintain adequate ventilation/respiration, pulmonary toilet, and respiratory treatment.  May admit patient to [facility] or [facility] if equivalent level of care is available:: No  Covid Evaluation: Asymptomatic - no recent exposure (last 10 days) testing not required  Diagnosis: Acute respiratory failure with hypoxia [facility]) [[id number]]  Admitting Physician: Steffanie Rainwater [[id number]]  Attending Physician: Steffanie Rainwater [[id number]]  Certification:: I certify this patient will need inpatient services for at least 2 midnights  Expected Medical Readiness: 05/24/2023          B Medical/Surgery History Past Medical History:  Diagnosis Date   Arthritis    hands   CAD (coronary artery disease)    a. 2010: stents pRCA and dRCA. b. LHC 12/13/13: Minimal distal LM dz. LAD 95% ostial w/ L-->R collat. LCx mild dz. RCA 95% mid-dRCA. Patent stents proximal and distal RCA. EF not assessed, Nl by nuclear study.      Central  retinal vein occlusion, right eye    Chronic kidney disease    hx kidney stone   Depression    Diplopia    Ear pain    right   Eosinophilic esophagitis    Essential hypertension    GERD (gastroesophageal reflux disease)    H/O: whooping cough    Hyperlipidemia with target LDL less than 70    Incontinence    urine   Stroke (HCC) 2001   reesultant Rt spastic hemplegia and aphasia    Past Surgical History:  Procedure Laterality Date   ABDOMINAL HYSTERECTOMY     CAROTID STENT     cataracts     COLONOSCOPY N/A 10/05/2015   Procedure: COLONOSCOPY;  Surgeon: Ruffin Frederick, MD;  Location: [facility];  Service: Gastroenterology;  Laterality: N/A;   CORONARY ARTERY BYPASS GRAFT N/A 12/17/2013   Procedure: CORONARY ARTERY BYPASS GRAFTING (CABG);  Surgeon: Alleen Borne, MD;  Location: [facility] OR;  Service: Open Heart Surgery;  Laterality: N/A;  Times 2 using left internal mammary artery and endoscopically harvested right saphenous vein   ESOPHAGOGASTRODUODENOSCOPY     ESOPHAGOGASTRODUODENOSCOPY N/A 10/05/2015   Procedure: ESOPHAGOGASTRODUODENOSCOPY (EGD);  Surgeon: Ruffin Frederick, MD;  Location: [facility];  Service: Gastroenterology;  Laterality: N/A;   INTRAOPERATIVE TRANSESOPHAGEAL ECHOCARDIOGRAM N/A 12/17/2013   Procedure: INTRAOPERATIVE TRANSESOPHAGEAL ECHOCARDIOGRAM;  Surgeon: Alleen Borne, MD;  Location: [facility] OR;  Service: Open Heart Surgery;  Laterality: N/A;   lasix     LEFT HEART CATHETERIZATION WITH CORONARY ANGIOGRAM N/A 12/13/2013   Procedure: LEFT HEART CATHETERIZATION WITH CORONARY ANGIOGRAM;  Surgeon: Corky Crafts, MD;  Location: [facility] CATH LAB;  Service: Cardiovascular;  Laterality: N/A;   retina injection       A IV Location/Drains/Wounds Patient Lines/Drains/Airways Status     Active Line/Drains/Airways     Name Placement date Placement time Site Days   Peripheral IV 05/21/23 20 G Anterior;Distal;Right Forearm 05/21/23  1535  Forearm  less than 1             Intake/Output Last 24 hours No intake or output data in the 24 hours ending 05/21/23 2100  Labs/Imaging Results for orders placed or performed during the hospital encounter of 05/21/23 (from the past 48 hours)  Resp panel by RT-PCR (RSV, Flu A&B, Covid) Anterior Nasal Swab     Status: None   Collection Time: 05/21/23  3:33 PM   Specimen: Anterior Nasal Swab  Result Value Ref Range   SARS Coronavirus 2 by RT PCR NEGATIVE NEGATIVE    Comment: (NOTE) SARS-CoV-2 target nucleic acids are NOT DETECTED.  The SARS-CoV-2 RNA is generally detectable in upper respiratory specimens during the acute phase of infection. The lowest concentration of SARS-CoV-2 viral copies this assay can detect is 138 copies/mL. A negative result does not preclude SARS-Cov-2 infection and should not be used as the sole basis for treatment or other patient management decisions. A negative result may occur with  improper specimen collection/handling, submission of specimen other than nasopharyngeal swab, presence of viral mutation(s) within the areas targeted by this assay, and inadequate number of viral copies(<138 copies/mL). A negative result must be combined with clinical observations, patient history, and epidemiological information. The expected result is Negative.  Fact Sheet for Patients:  BloggerCourse.com  Fact Sheet for Healthcare Providers:  SeriousBroker.it  This test is no t yet approved or cleared by the [place] FDA and  has been authorized for detection and/or diagnosis of SARS-CoV-2 by FDA under an Emergency Use Authorization (EUA). This EUA will remain  in effect (meaning this test can be used) for the duration of the COVID-19 declaration under Section 564(b)(1) of the Act, 21 U.S.C.section 360bbb-3(b)(1), unless the authorization is terminated  or revoked sooner.       Influenza A by PCR NEGATIVE NEGATIVE   Influenza  B by PCR NEGATIVE NEGATIVE    Comment: (NOTE) The Xpert Xpress SARS-CoV-2/FLU/RSV plus assay is intended as an aid in the diagnosis of influenza from Nasopharyngeal swab specimens and should not be used as a sole basis for treatment. Nasal washings and aspirates are unacceptable for Xpert Xpress SARS-CoV-2/FLU/RSV testing.  Fact Sheet for Patients: BloggerCourse.com  Fact Sheet for Healthcare Providers: SeriousBroker.it  This test is not yet approved or cleared by the [place] FDA and has been authorized for detection and/or diagnosis of SARS-CoV-2 by FDA under an Emergency Use Authorization (EUA). This EUA will remain in effect (meaning this test can be used) for the duration of the COVID-19 declaration under Section 564(b)(1) of the Act, 21 U.S.C. section 360bbb-3(b)(1), unless the authorization is terminated or revoked.     Resp Syncytial Virus by PCR NEGATIVE NEGATIVE    Comment: (NOTE) Fact Sheet for Patients: BloggerCourse.com  Fact Sheet for Healthcare Providers: SeriousBroker.it  This test is not yet approved or cleared by the [place] FDA and has been authorized for detection and/or diagnosis of SARS-CoV-2 by FDA under an Emergency Use Authorization (EUA). This EUA will remain in effect (meaning this test can be used) for the duration of the COVID-19 declaration under Section 564(b)(1)  of the Act, 21 U.S.C. section 360bbb-3(b)(1), unless the authorization is terminated or revoked.  Performed at [facility], [address] [place], [place] [address]   Basic metabolic panel     Status: Abnormal   Collection Time: 05/21/23  3:50 PM  Result Value Ref Range   Sodium 145 135 - 145 mmol/L   Potassium 4.5 3.5 - 5.1 mmol/L   Chloride 107 98 - 111 mmol/L   CO2 25 22 - 32 mmol/L   Glucose, Bld 213 (H) 70 - 99 mg/dL    Comment: Glucose  reference range applies only to samples taken after fasting for at least 8 hours.   BUN 30 (H) 8 - 23 mg/dL   Creatinine, Ser 6.04 0.44 - 1.00 mg/dL   Calcium 9.3 8.9 - 54.0 mg/dL   GFR, Estimated 56 (L) >60 mL/min    Comment: (NOTE) Calculated using the CKD-EPI Creatinine Equation (2021)    Anion gap 13 5 - 15    Comment: Performed at [facility], [address] [place], [place] [address]  CBC with Differential     Status: Abnormal   Collection Time: 05/21/23  3:50 PM  Result Value Ref Range   WBC 15.5 (H) 4.0 - 10.5 K/uL   RBC 6.04 (H) 3.87 - 5.11 MIL/uL   Hemoglobin 15.8 (H) 12.0 - 15.0 g/dL   HCT 14.7 (H) 82.9 - 56.2 %   MCV 88.2 80.0 - 100.0 fL   MCH 26.2 26.0 - 34.0 pg   MCHC 29.6 (L) 30.0 - 36.0 g/dL   RDW 13.0 86.5 - 78.4 %   Platelets 413 (H) 150 - 400 K/uL   nRBC 0.0 0.0 - 0.2 %   Neutrophils Relative % 87 %   Neutro Abs 13.4 (H) 1.7 - 7.7 K/uL   Lymphocytes Relative 3 %   Lymphs Abs 0.5 (L) 0.7 - 4.0 K/uL   Monocytes Relative 9 %   Monocytes Absolute 1.4 (H) 0.1 - 1.0 K/uL   Eosinophils Relative 0 %   Eosinophils Absolute 0.1 0.0 - 0.5 K/uL   Basophils Relative 0 %   Basophils Absolute 0.1 0.0 - 0.1 K/uL   Immature Granulocytes 1 %   Abs Immature Granulocytes 0.16 (H) 0.00 - 0.07 K/uL    Comment: Performed at [facility], [address], [place], [place] [address]  Troponin I (High Sensitivity)     Status: None   Collection Time: 05/21/23  3:50 PM  Result Value Ref Range   Troponin I (High Sensitivity) 14 <18 ng/L    Comment: (NOTE) Elevated high sensitivity troponin I (hsTnI) values and significant  changes across serial measurements may suggest ACS but many other  chronic and acute conditions are known to elevate hsTnI results.  Refer to the "Links" section for chest pain algorithms and additional  guidance. Performed at [facility], [address], [place], [place] [address]   Brain natriuretic  peptide     Status: None   Collection Time: 05/21/23  3:50 PM  Result Value Ref Range   B Natriuretic Peptide 60.0 0.0 - 100.0 pg/mL    Comment: Performed at [facility], [address], [place], [place] [address]  Blood gas, venous (at [facility] and [facility])     Status: Abnormal   Collection Time: 05/21/23  5:42 PM  Result Value Ref Range   pH, Ven 7.49 (H) 7.25 - 7.43   pCO2, Ven 44 44 - 60  mmHg   pO2, Ven 51 (H) 32 - 45 mmHg   Bicarbonate 33.5 (H) 20.0 - 28.0 mmol/L   Acid-Base Excess 9.0 (H) 0.0 - 2.0 mmol/L   O2 Saturation 87.5 %   Patient temperature 37.0     Comment: Performed at [facility], [address], [place], [place] [address]  Troponin I (High Sensitivity)     Status: None   Collection Time: 05/21/23  5:42 PM  Result Value Ref Range   Troponin I (High Sensitivity) 14 <18 ng/L    Comment: (NOTE) Elevated high sensitivity troponin I (hsTnI) values and significant  changes across serial measurements may suggest ACS but many other  chronic and acute conditions are known to elevate hsTnI results.  Refer to the "Links" section for chest pain algorithms and additional  guidance. Performed at [facility], [address], [place], [place] [address]    DG Chest Portable 1 View Result Date: 05/21/2023 CLINICAL DATA:  Shortness of breath EXAM: PORTABLE CHEST 1 VIEW COMPARISON:  05/13/2023 of FINDINGS: Examination limited by patient kyphosis and chin obscuring the lung apices. Stable heart size dad is post sternotomy and CABG. Aortic atherosclerosis. Low lung volumes. Probable mild bibasilar atelectasis. No large pleural fluid collection. No pneumothorax within the limitations of this study. IMPRESSION: Low lung volumes with probable mild bibasilar atelectasis. Electronically Signed   By: Duanne Guess D.O.   On: 05/21/2023 16:03    Pending Labs Unresulted Labs (From admission, onward)     Start     Ordered   05/21/23 1436   Urinalysis, Routine w reflex microscopic -Urine, Clean Catch  Once,   URGENT       Question:  Specimen Source  Answer:  Urine, Clean Catch   05/21/23 1435            Vitals/Pain Today's Vitals   05/21/23 1416 05/21/23 1507 05/21/23 1530 05/21/23 1900  BP:   (!) 126/98   Pulse:  (!) 117 (!) 38   Resp:  (!) 41 (!) 36   Temp:    98.7 F (37.1 C)  TempSrc:      SpO2:  94% 95%   PainSc: 0-No pain       Isolation Precautions No active isolations  Medications Medications  enoxaparin (LOVENOX) injection 40 mg (has no administration in time range)  acetaminophen (TYLENOL) tablet 650 mg (has no administration in time range)    Or  acetaminophen (TYLENOL) suppository 650 mg (has no administration in time range)  senna-docusate (Senokot-S) tablet 1 tablet (has no administration in time range)  ondansetron (ZOFRAN) tablet 4 mg (has no administration in time range)    Or  ondansetron (ZOFRAN) injection 4 mg (has no administration in time range)  Ampicillin-Sulbactam (UNASYN) 3 g in sodium chloride 0.9 % 100 mL IVPB (3 g Intravenous New Bag/Given 05/21/23 1752)  fentaNYL (SUBLIMAZE) injection 25 mcg (25 mcg Intravenous Given 05/21/23 1841)  bisacodyl (DULCOLAX) suppository 10 mg (10 mg Rectal Given 05/21/23 1841)    Mobility non-ambulatory     Focused Assessments Pulmonary Assessment Handoff:  Lung sounds: Bilateral Breath Sounds: Diminished L Breath Sounds: Diminished O2 Device: (S) Nasal Cannula O2 Flow Rate (L/min): 5 L/min    R Recommendations: See Admitting Provider Note  Report given to:   Additional Notes: pt potentially aspirated at [facility] nursing home. Is here for evaluation of respiratory effort. On 5 ltrs nasal canula.

## 2023-05-21 NOTE — Progress Notes (Signed)
PT taken off bipap and placed on 5L [place].  Pts HR 89, RR 24.  Pt is more alert and is doing well off bipap.  Family at bedside.

## 2023-05-21 NOTE — H&P (Signed)
History and Physical  Kristin [name] BJY:[id number] DOB: 1940-08-12 DOA: 05/21/2023  PCP: Kristin Seller, NP   Chief Complaint: Difficulty breathing  HPI: Kristin [name] is a 83 y.o. female with medical history significant for CVA w/ residual right-sided weakness and aphasia, second CVA on 1/21 with residual left-sided weakness, HTN, GERD, overactive bladder, neuropathy, CAD s/p stenting, and HLD who presents from SNF for evaluation of shortness of breath.  Patient evaluated while on BiPAP. Per stepdaughter, patient has had significant decline over the last week after her recent stroke and currently bedbound.  She recently moved from ALF to SNF today after eating her thickened diet, she started coughing with difficulty breathing. EMS was called due to concern for aspiration. She also reports that patient has been constipated over the last few days with mild abdominal distention so she was given MiraLAX at the facility. Patient able to nod yes/no appropriately to questions. She denies any pain or any acute concerns.  ED Course: Initial vitals showed temp 97.3, RR 42, HR 128, BP 152/112, SpO2 93% on nonrebreather mask with 13 L.  Patient became more hypoxic and was placed on BiPAP with FiO2 of 70%. Labs were significant for WBC of 15.5, unremarkable VBG, negative COVID, RSV and flu test.  Chest x-ray shows low lung volumes with probable mild bibasilar atelectasis. Patient received IV fentanyl and IV Unasyn. TRH was consulted for admission  Review of Systems: Please see HPI for pertinent positives and negatives. A complete 10 system review of systems are otherwise negative.  Past Medical History:  Diagnosis Date   Arthritis    hands   CAD (coronary artery disease)    a. 2010: stents pRCA and dRCA. b. LHC 12/13/13: Minimal distal LM dz. LAD 95% ostial w/ L-->R collat. LCx mild dz. RCA 95% mid-dRCA. Patent stents proximal and distal RCA. EF not assessed, Nl by nuclear study.      Central retinal vein  occlusion, right eye    Chronic kidney disease    hx kidney stone   Depression    Diplopia    Ear pain    right   Eosinophilic esophagitis    Essential hypertension    GERD (gastroesophageal reflux disease)    H/O: whooping cough    Hyperlipidemia with target LDL less than 70    Incontinence    urine   Stroke (HCC) 2001   reesultant Rt spastic hemplegia and aphasia    Past Surgical History:  Procedure Laterality Date   ABDOMINAL HYSTERECTOMY     CAROTID STENT     cataracts     COLONOSCOPY N/A 10/05/2015   Procedure: COLONOSCOPY;  Surgeon: Ruffin Frederick, MD;  Location: [facility];  Service: Gastroenterology;  Laterality: N/A;   CORONARY ARTERY BYPASS GRAFT N/A 12/17/2013   Procedure: CORONARY ARTERY BYPASS GRAFTING (CABG);  Surgeon: Alleen Borne, MD;  Location: [facility] OR;  Service: Open Heart Surgery;  Laterality: N/A;  Times 2 using left internal mammary artery and endoscopically harvested right saphenous vein   ESOPHAGOGASTRODUODENOSCOPY     ESOPHAGOGASTRODUODENOSCOPY N/A 10/05/2015   Procedure: ESOPHAGOGASTRODUODENOSCOPY (EGD);  Surgeon: Ruffin Frederick, MD;  Location: [facility];  Service: Gastroenterology;  Laterality: N/A;   INTRAOPERATIVE TRANSESOPHAGEAL ECHOCARDIOGRAM N/A 12/17/2013   Procedure: INTRAOPERATIVE TRANSESOPHAGEAL ECHOCARDIOGRAM;  Surgeon: Alleen Borne, MD;  Location: [facility] OR;  Service: Open Heart Surgery;  Laterality: N/A;   lasix     LEFT HEART CATHETERIZATION WITH CORONARY ANGIOGRAM N/A 12/13/2013   Procedure: LEFT HEART CATHETERIZATION WITH  CORONARY ANGIOGRAM;  Surgeon: Corky Crafts, MD;  Location: [facility] CATH LAB;  Service: Cardiovascular;  Laterality: N/A;   retina injection     Social History:  reports that she has never smoked. She has never used smokeless tobacco. She reports current alcohol use. She reports that she does not use drugs.  No Known Allergies  Family History  Problem Relation Age of Onset   Diabetes Mother    Heart  disease Mother    Heart attack Father    Breast cancer Sister      Prior to Admission medications   Medication Sig Start Date End Date Taking? Authorizing Provider  acetaminophen (TYLENOL) 500 MG tablet Take 1,000 mg by mouth 3 (three) times daily as needed for mild pain (pain score 1-3), headache or fever.    [provider]  amLODipine (NORVASC) 5 MG tablet Take 1 tablet (5 mg total) by mouth daily. 05/17/23   Rolly Salter, MD  aspirin 81 MG tablet Take 81 mg by mouth in the morning.    [provider]  atorvastatin (LIPITOR) 40 MG tablet tAKE ONE TABLET BY MOUTH DAILY. Patient taking differently: Take 40 mg by mouth every evening. 03/04/23   Kristin Seller, NP  Cholecalciferol (VITAMIN D3) 50 MCG (2000 UT) capsule Take 2,000 Units by mouth at bedtime.    [provider]  clopidogrel (PLAVIX) 75 MG tablet TAKE ONE TABLET BY MOUTH DAILY Patient taking differently: Take 75 mg by mouth in the morning. 09/02/22   Kristin Seller, NP  Coenzyme Q10 (COQ-10 PO) Take 1 tablet daily by mouth.    [provider]  diclofenac Sodium (VOLTAREN) 1 % GEL APPLY TO THE AFFECTED AREA(S) THREE TIMES DAILY. Patient not taking: Reported on 05/13/2023 02/13/21   Horton Chin, MD  DULoxetine (CYMBALTA) 20 MG capsule TAKE ONE CAPSULE BY MOUTH DAILY Patient taking differently: Take 20 mg by mouth in the morning. 12/04/22   Kristin Seller, NP  estradiol (ESTRACE) 0.1 MG/GM vaginal cream Place 1 Applicatorful vaginally 3 (three) times a week. 03/02/20   [provider]  feeding supplement (ENSURE ENLIVE / ENSURE PLUS) LIQD Take 237 mLs by mouth 2 (two) times daily between meals. 05/15/23   Rolly Salter, MD  ferrous sulfate 325 (65 FE) MG tablet Take 325 mg by mouth See admin instructions. Take 325 mg by mouth WITH FOOD three times a week    [provider]  gabapentin (NEURONTIN) 300 MG capsule TAKE ONE CAPSULE BY MOUTH THREE TIMES DAILY Patient  taking differently: Take 300 mg by mouth in the morning, at noon, and at bedtime. 12/04/22   Raulkar, Drema Pry, MD  hydrocortisone (ANUSOL-HC) 2.5 % rectal cream Place 1 Application rectally as needed for hemorrhoids. Patient not taking: Reported on 05/13/2023 08/12/22   Iva Boop, MD  methocarbamol (ROBAXIN) 500 MG tablet Take 1 tablet (500 mg total) by mouth every 8 (eight) hours as needed for muscle spasms. 03/30/21   Ngetich, Dinah C, NP  mirabegron ER (MYRBETRIQ) 50 MG TB24 tablet Take 50 mg by mouth in the morning. 08/25/20   [provider]  nitroGLYCERIN (NITROSTAT) 0.4 MG SL tablet Place 1 tablet (0.4 mg total) under the tongue every 5 (five) minutes as needed for chest pain. 10/30/21   Kristin Seller, NP  pantoprazole (PROTONIX) 40 MG tablet Take 1 tablet (40 mg total) by mouth daily. 05/16/23   Rolly Salter, MD  polyethylene glycol [facility] Hal)  17 g packet Take 17 g by mouth daily. Patient taking differently: Take 17 g by mouth daily as needed for mild constipation. 05/24/22   Albertine Grates, MD  potassium chloride (KLOR-CON M) 10 MEQ tablet Take 1 tablet (10 mEq total) by mouth daily. 05/16/23   Rolly Salter, MD  trimethoprim (TRIMPEX) 100 MG tablet Take 100 mg by mouth every evening. 04/29/22   [provider]  TURMERIC PO Take 1 capsule by mouth daily.    [provider]    Physical Exam: BP (!) 147/94   Pulse 95   Temp 98.6 F (37 C)   Resp (!) 35   SpO2 93%  General: Pleasant, acutely ill elderly woman laying comfortably in bed on BiPAP intermittently trying to take off the mask. HEENT: [place]/AT. Anicteric sclera CV: RRR. No murmurs, rubs, or gallops. No LE edema Pulmonary: On BiPAP.  Anterior lung sounds clear to auscultation. No wheezing or rales. Abdominal: Soft, nontender. Mildly distended. Normal bowel sounds. Extremities: Palpable radial and DP pulses. Normal ROM. Skin: Warm and dry. No obvious rash or lesions. Neuro: Alert and oriented.   Spastic hemiplegia of the right upper and lower extremities. Able to move left arm above gravity, slightly moves left lower extremity but not against gravity. Normal sensation to light touch. No focal deficit. Psych: Normal mood and affect          Labs on Admission:  Basic Metabolic Panel: Recent Labs  Lab 05/15/23 0536 05/21/23 1550  NA 145 145  K 3.9 4.5  CL 108 107  CO2 28 25  GLUCOSE 137* 213*  BUN 13 30*  CREATININE 0.64 0.99  CALCIUM 8.6* 9.3  MG 2.0  --    Liver Function Tests: No results for input(s): "AST", "ALT", "ALKPHOS", "BILITOT", "PROT", "ALBUMIN" in the last 168 hours. No results for input(s): "LIPASE", "AMYLASE" in the last 168 hours. No results for input(s): "AMMONIA" in the last 168 hours. CBC: Recent Labs  Lab 05/15/23 0536 05/21/23 1550  WBC 8.3 15.5*  NEUTROABS  --  13.4*  HGB 13.3 15.8*  HCT 43.2 53.3*  MCV 86.4 88.2  PLT 301 413*   Cardiac Enzymes: No results for input(s): "CKTOTAL", "CKMB", "CKMBINDEX", "TROPONINI" in the last 168 hours. BNP (last 3 results) Recent Labs    05/21/23 1550  BNP 60.0    ProBNP (last 3 results) No results for input(s): "PROBNP" in the last 8760 hours.  CBG: No results for input(s): "GLUCAP" in the last 168 hours.  Radiological Exams on Admission: DG Chest Portable 1 View Result Date: 05/21/2023 CLINICAL DATA:  Shortness of breath EXAM: PORTABLE CHEST 1 VIEW COMPARISON:  05/13/2023 of FINDINGS: Examination limited by patient kyphosis and chin obscuring the lung apices. Stable heart size dad is post sternotomy and CABG. Aortic atherosclerosis. Low lung volumes. Probable mild bibasilar atelectasis. No large pleural fluid collection. No pneumothorax within the limitations of this study. IMPRESSION: Low lung volumes with probable mild bibasilar atelectasis. Electronically Signed   By: Duanne Guess D.O.   On: 05/21/2023 16:03   Assessment/Plan Kristin [name] is a 83 y.o. female with medical history significant  for  CVA w/ residual right-sided weakness and aphasia, second CVA on 1/21 with residual left-sided weakness, HTN, GERD, overactive bladder, neuropathy, CAD s/p stenting, and HLD who presents from SNF for evaluation of shortness of breath and admitted for acute hypoxic respiratory failure secondary to aspiration pneumonia  # Acute hypoxic respiratory failure # Aspiration pneumonia Elderly patient with recent  functional decline secondary to recurrent CVA who is now bedbound presented with difficulty breathing found to be hypoxic and tachypneic on admission requiring oxygen supplementation and BiPAP therapy. Clinical picture concerning for aspiration pneumonia.  Normal pCO2 on VBG. Patient able to wean down to 5 L [place] with RR in the 20s. -Continue supplemental oxygen, wean as able -BiPAP therapy as needed -Continue IV Unasyn -Trend CBC, fever curve -Follow-up procalcitonin -Aspiration precautions  # CVA s/p right and left hemiplegia # Aphasia # Dysphagia Patient with significant functional decline over the last week after her second stroke.  Continues to have aphasia and currently on a dysphagia diet at her facility. -Keep n.p.o. for now -Registered dietitian consulted appreciate assistance -Resume home meds after passing swallow screen -PT/OT eval and treat -Fall and delirium precautions  # Constipation Patient with few days of constipation in the setting of being bedbound.  Abdomen slightly distended but no tenderness. -Give Dulcolax suppository -Monitor for BM  # HTN # HLD # Depression # Overactive bladder # GERD # CAD -Please resume home meds after patient passed swallow screen   DVT prophylaxis: Lovenox     Code Status: Limited: Do not attempt resuscitation (DNR) -DNR-LIMITED -Do Not Intubate/DNI   Consults called: None  Family Communication: Discussed admission with family at bedside  Severity of Illness: The appropriate patient status for this patient is INPATIENT.  Inpatient status is judged to be reasonable and necessary in order to provide the required intensity of service to ensure the patient's safety. The patient's presenting symptoms, physical exam findings, and initial radiographic and laboratory data in the context of their chronic comorbidities is felt to place them at high risk for further clinical deterioration. Furthermore, it is not anticipated that the patient will be medically stable for discharge from the hospital within 2 midnights of admission.   * I certify that at the point of admission it is my clinical judgment that the patient will require inpatient hospital care spanning beyond 2 midnights from the point of admission due to high intensity of service, high risk for further deterioration and high frequency of surveillance required.*  Level of care: Elpidio Galea, MD 05/21/2023, 10:55 PM [facility] Pager: [phone] Isaiah 41:10   If 7PM-7AM, please contact night-coverage [email or web address] Password [email or web address]

## 2023-05-21 NOTE — Progress Notes (Signed)
PT placed on bipap per MD order.  PT is tolerating well.  HR 117, RR 30, sats 94%.  Pt is on 12/6, R 14 and 70%.  Family at bedside.  Safety measures for mask removal discussed with pts SIL.

## 2023-05-22 ENCOUNTER — Other Ambulatory Visit: Payer: Self-pay

## 2023-05-22 ENCOUNTER — Encounter (HOSPITAL_COMMUNITY): Payer: Self-pay | Admitting: Student

## 2023-05-22 DIAGNOSIS — J690 Pneumonitis due to inhalation of food and vomit: Secondary | ICD-10-CM

## 2023-05-22 DIAGNOSIS — Z515 Encounter for palliative care: Secondary | ICD-10-CM | POA: Diagnosis not present

## 2023-05-22 DIAGNOSIS — Z79899 Other long term (current) drug therapy: Secondary | ICD-10-CM | POA: Diagnosis not present

## 2023-05-22 DIAGNOSIS — I639 Cerebral infarction, unspecified: Secondary | ICD-10-CM

## 2023-05-22 DIAGNOSIS — Z7189 Other specified counseling: Secondary | ICD-10-CM | POA: Diagnosis not present

## 2023-05-22 DIAGNOSIS — Z66 Do not resuscitate: Secondary | ICD-10-CM

## 2023-05-22 DIAGNOSIS — R0602 Shortness of breath: Secondary | ICD-10-CM | POA: Diagnosis not present

## 2023-05-22 DIAGNOSIS — J9601 Acute respiratory failure with hypoxia: Secondary | ICD-10-CM | POA: Diagnosis not present

## 2023-05-22 LAB — BASIC METABOLIC PANEL
Anion gap: 8 (ref 5–15)
BUN: 30 mg/dL — ABNORMAL HIGH (ref 8–23)
CO2: 29 mmol/L (ref 22–32)
Calcium: 8.9 mg/dL (ref 8.9–10.3)
Chloride: 111 mmol/L (ref 98–111)
Creatinine, Ser: 0.85 mg/dL (ref 0.44–1.00)
GFR, Estimated: >60 mL/min (ref >60)
Glucose, Bld: 181 mg/dL — ABNORMAL HIGH (ref 70–99)
Potassium: 4.2 mmol/L (ref 3.5–5.1)
Sodium: 148 mmol/L — ABNORMAL HIGH (ref 135–145)

## 2023-05-22 LAB — CBC
HCT: 52.2 % — ABNORMAL HIGH (ref 36.0–46.0)
Hemoglobin: 15.1 g/dL — ABNORMAL HIGH (ref 12.0–15.0)
MCH: 26.4 pg (ref 26.0–34.0)
MCHC: 28.9 g/dL — ABNORMAL LOW (ref 30.0–36.0)
MCV: 91.3 fL (ref 80.0–100.0)
Platelets: 387 10*3/uL (ref 150–400)
RBC: 5.72 MIL/uL — ABNORMAL HIGH (ref 3.87–5.11)
RDW: 15.3 % (ref 11.5–15.5)
WBC: 18.1 10*3/uL — ABNORMAL HIGH (ref 4.0–10.5)
nRBC: 0 % (ref 0.0–0.2)

## 2023-05-22 LAB — MRSA NEXT GEN BY PCR, NASAL: MRSA by PCR Next Gen: NOT DETECTED

## 2023-05-22 LAB — PROCALCITONIN: Procalcitonin: <0.1 ng/mL

## 2023-05-22 MED ORDER — OXYCODONE HCL 20 MG/ML PO CONC: 5.0000 mg | ORAL | Status: DC | PRN

## 2023-05-22 MED ORDER — HALOPERIDOL LACTATE 5 MG/ML IJ SOLN: 0.5000 mg | INTRAMUSCULAR | Status: DC | PRN

## 2023-05-22 MED ORDER — LORAZEPAM 1 MG PO TABS: 1.0000 mg | ORAL_TABLET | ORAL | Status: DC | PRN

## 2023-05-22 MED ORDER — GLYCOPYRROLATE 0.2 MG/ML IJ SOLN: 0.2000 mg | INTRAMUSCULAR | Status: DC | PRN

## 2023-05-22 MED ORDER — DEXTROSE-SODIUM CHLORIDE 5-0.45 % IV SOLN
INTRAVENOUS | Status: DC
Start: 2023-05-22 — End: 2023-05-22

## 2023-05-22 MED ORDER — BIOTENE DRY MOUTH MT LIQD: 15.0000 mL | OROMUCOSAL | Status: DC | PRN

## 2023-05-22 MED ORDER — GLYCOPYRROLATE 1 MG PO TABS: 1.0000 mg | ORAL_TABLET | ORAL | Status: DC | PRN

## 2023-05-22 MED ORDER — POLYVINYL ALCOHOL 1.4 % OP SOLN: 1.0000 [drp] | Freq: Four times a day (QID) | OPHTHALMIC | Status: DC | PRN

## 2023-05-22 MED ORDER — HALOPERIDOL 1 MG PO TABS: 0.5000 mg | ORAL_TABLET | ORAL | Status: DC | PRN

## 2023-05-22 MED ORDER — HYDRALAZINE HCL 20 MG/ML IJ SOLN
5.0000 mg | Freq: Four times a day (QID) | INTRAMUSCULAR | Status: DC | PRN
  Administered 2023-05-22: 5 mg via INTRAVENOUS
  Filled 2023-05-22: qty 1

## 2023-05-22 MED ORDER — HALOPERIDOL LACTATE 2 MG/ML PO CONC: 0.5000 mg | ORAL | Status: DC | PRN

## 2023-05-22 MED ORDER — LORAZEPAM 2 MG/ML IJ SOLN
1.0000 mg | INTRAMUSCULAR | Status: DC | PRN
  Administered 2023-05-22: 1 mg via INTRAVENOUS
  Filled 2023-05-22: qty 1

## 2023-05-22 MED ORDER — LORAZEPAM 2 MG/ML PO CONC
1.0000 mg | ORAL | Status: DC | PRN
  Administered 2023-05-23: 1 mg via SUBLINGUAL
  Filled 2023-05-22: qty 0.5

## 2023-05-22 MED ORDER — MORPHINE SULFATE (PF) 2 MG/ML IV SOLN
1.0000 mg | INTRAVENOUS | Status: DC | PRN
  Administered 2023-05-22: 1 mg via INTRAVENOUS
  Administered 2023-05-23: 2 mg via INTRAVENOUS
  Administered 2023-05-23: 4 mg via INTRAVENOUS
  Administered 2023-05-23: 2 mg via INTRAVENOUS
  Filled 2023-05-22 (×2): qty 1
  Filled 2023-05-22: qty 2
  Filled 2023-05-22: qty 1

## 2023-05-22 MED ORDER — OXYCODONE HCL 20 MG/ML PO CONC
5.0000 mg | ORAL | Status: DC | PRN
  Administered 2023-05-22 – 2023-05-23 (×2): 5 mg via SUBLINGUAL
  Filled 2023-05-22 (×2): qty 0.5

## 2023-05-22 MED ORDER — ORAL CARE MOUTH RINSE: 15.0000 mL | OROMUCOSAL | Status: DC | PRN

## 2023-05-22 MED ORDER — CHLORHEXIDINE GLUCONATE CLOTH 2 % EX PADS
6.0000 | MEDICATED_PAD | Freq: Every day | CUTANEOUS | Status: DC
  Administered 2023-05-22: 6 via TOPICAL

## 2023-05-22 NOTE — Progress Notes (Signed)
PROGRESS NOTE  Kristin [name] ZOX:[id number] DOB: 11-23-1940 DOA: 05/21/2023 PCP: Sharon Seller, NP   LOS: 1 day   Brief Narrative / Interim history: 83 year old female with prior CVAs, residual right-sided weakness and aphasia, most recent CVA January 2025, history of CAD status post stenting comes into the hospital from SNF for shortness of breath.  Following her most recent stroke she apparently has been having a significant decline.  She recently moved from her independent living facility to SNF last month.  She has been having difficulties eating even her thickened diet.  Had a probable aspiration event, EMS was called and she was brought to the hospital.  She was found to be significantly hypoxic, have leukocytosis, and was admitted to the hospital and placed on Unasyn.  Subjective / 24h Interval events: Stepdaughter is at bedside, patient herself appears alert, has aphasia but seems to understand and track the people in the room.  She spontaneously move her left upper extremity which is close to baseline  Assesement and Plan: Principal problem Acute hypoxic respiratory failure due to aspiration pneumonia -this is likely in the setting of possibly worsening dysphagia given recent CVA.  She is not doing well even with thickened fluids per stepdaughter.  Speech consulted, appreciate input -Continue IV antibiotics -Continue supplemental oxygen, wean off to room air as tolerated  Active problems Recurrent CVAs, right hemiplegia, aphasia, dysphagia -with apparent significant decline in the last several weeks after most recent CVA.  MRI of the brain done 05/13/2023 showed acute/subacute infarct of right basal ganglia and regional white matter tracts, and also old left MCA infarction with atrophy, encephalomalacia and gliosis.  She was also found to have chronic small vessel ischemic changes of bilateral white matter as well as the pons -Further goals of care discussions are appropriate at this  point given that she continues to aspirate.  Palliative consulted  Constipation-continuePRNs  Essential hypertension-resume home medications once she worked with PT and assess safety  Hyperlipidemia-on statin  CAD-resume home meds if she passes swallow eval  Scheduled Meds:  Chlorhexidine Gluconate Cloth  6 each Topical Daily   enoxaparin (LOVENOX) injection  40 mg Subcutaneous Q24H   Continuous Infusions:  dextrose 5 % and 0.45 % NaCl 75 mL/hr at 05/22/23 0826   PRN Meds:.acetaminophen **OR** acetaminophen, hydrALAZINE, ondansetron **OR** ondansetron (ZOFRAN) IV, mouth rinse, senna-docusate  Current Outpatient Medications  Medication Instructions   acetaminophen (TYLENOL) 1,000 mg, Oral, 3 times daily PRN   amLODipine (NORVASC) 5 mg, Oral, Daily   aspirin 81 mg, Oral, Every morning   atorvastatin (LIPITOR) 40 mg, Oral, Daily   clopidogrel (PLAVIX) 75 MG tablet TAKE ONE TABLET BY MOUTH DAILY   Coenzyme Q10 (COQ-10 PO) 1 tablet, Oral, Daily   diclofenac Sodium (VOLTAREN) 1 % GEL APPLY TO THE AFFECTED AREA(S) THREE TIMES DAILY.   docusate sodium (COLACE) 100 mg, Oral, Daily   DULoxetine (CYMBALTA) 20 mg, Oral, Daily   estradiol (ESTRACE) 0.1 MG/GM vaginal cream 1 Applicatorful, Vaginal, Every M-W-F   feeding supplement (ENSURE ENLIVE / ENSURE PLUS) LIQD 237 mLs, Oral, 2 times daily between meals   ferrous sulfate 325 mg, Oral, Every M-W-F   gabapentin (NEURONTIN) 300 MG capsule TAKE ONE CAPSULE BY MOUTH THREE TIMES DAILY   methocarbamol (ROBAXIN) 500 mg, Oral, Every 8 hours PRN   mirabegron ER (MYRBETRIQ) 50 mg, Oral, Every morning   nitroGLYCERIN (NITROSTAT) 0.4 mg, Sublingual, Every 5 min PRN   omeprazole (PRILOSEC) 20 mg, Daily   pantoprazole (PROTONIX) 40  mg, Oral, Daily   polyethylene glycol (MIRALAX / GLYCOLAX) 17 g, Oral, Daily   potassium chloride (KLOR-CON M) 10 MEQ tablet 10 mEq, Oral, Daily   sennosides-docusate sodium (SENOKOT-S) 8.6-50 MG tablet 1 tablet, Oral,  Daily at bedtime   trimethoprim (TRIMPEX) 100 mg, Oral, Every evening   TURMERIC PO 2 capsules, Oral, Every morning   Vitamin D3 2,000 Units, Oral, Daily at bedtime    Diet Orders (From admission, onward)     Start     Ordered   05/22/23 0003  Diet NPO time specified  Diet effective now        05/22/23 0002            DVT prophylaxis: enoxaparin (LOVENOX) injection 40 mg Start: 05/21/23 2200   Lab Results  Component Value Date   PLT 387 05/22/2023      Code Status: Limited: Do not attempt resuscitation (DNR) -DNR-LIMITED -Do Not Intubate/DNI   Family Communication: stepdaughter at bedside  Status is: Inpatient Remains inpatient appropriate because: severity of illness  Level of care: Stepdown  Consultants:  Palliative care  Objective: Vitals:   05/22/23 0508 05/22/23 0700 05/22/23 0800 05/22/23 0805  BP: (!) 184/72 (!) 153/85  (!) 157/118  Pulse: 96 91    Resp: (!) 28 (!) 27  (!) 22  Temp:   98.2 F (36.8 C)   TempSrc:   Oral   SpO2: 97% 98%      Intake/Output Summary (Last 24 hours) at 05/22/2023 0954 Last data filed at 05/22/2023 0846 Gross per 24 hour  Intake --  Output 275 ml  Net -275 ml   Wt Readings from Last 3 Encounters:  05/15/23 85.3 kg  05/07/23 84 kg  05/24/22 84.5 kg    Examination:  Constitutional: NAD Eyes: no scleral icterus ENMT: Mucous membranes are dry.  Neck: normal, supple Respiratory: Diminished at the bases, no significant wheezing or rhonchi heard Cardiovascular: Regular rate and rhythm, no murmurs / rubs / gallops.  No edema Abdomen: non distended, no tenderness. Bowel sounds positive.  Musculoskeletal: no clubbing / cyanosis.    Data Reviewed: I have independently reviewed following labs and imaging studies   CBC Recent Labs  Lab 05/21/23 1550 05/22/23 0328  WBC 15.5* 18.1*  HGB 15.8* 15.1*  HCT 53.3* 52.2*  PLT 413* 387  MCV 88.2 91.3  MCH 26.2 26.4  MCHC 29.6* 28.9*  RDW 15.5 15.3  LYMPHSABS 0.5*  --    MONOABS 1.4*  --   EOSABS 0.1  --   BASOSABS 0.1  --     Recent Labs  Lab 05/21/23 1550 05/22/23 0328  NA 145 148*  K 4.5 4.2  CL 107 111  CO2 25 29  GLUCOSE 213* 181*  BUN 30* 30*  CREATININE 0.99 0.85  CALCIUM 9.3 8.9  PROCALCITON  --  <0.10  BNP 60.0  --     ------------------------------------------------------------------------------------------------------------------ No results for input(s): "CHOL", "HDL", "LDLCALC", "TRIG", "CHOLHDL", "LDLDIRECT" in the last 72 hours.  Lab Results  Component Value Date   HGBA1C 5.7 (H) 05/14/2023   ------------------------------------------------------------------------------------------------------------------ No results for input(s): "TSH", "T4TOTAL", "T3FREE", "THYROIDAB" in the last 72 hours.  Invalid input(s): "FREET3"  Cardiac Enzymes No results for input(s): "CKMB", "TROPONINI", "MYOGLOBIN" in the last 168 hours.  Invalid input(s): "CK" ------------------------------------------------------------------------------------------------------------------    Component Value Date/Time   BNP 60.0 05/21/2023 1550    CBG: No results for input(s): "GLUCAP" in the last 168 hours.  Recent Results (from the past 240  hours)  Resp panel by RT-PCR (RSV, Flu A&B, Covid) Anterior Nasal Swab     Status: None   Collection Time: 05/21/23  3:33 PM   Specimen: Anterior Nasal Swab  Result Value Ref Range Status   SARS Coronavirus 2 by RT PCR NEGATIVE NEGATIVE Final    Comment: (NOTE) SARS-CoV-2 target nucleic acids are NOT DETECTED.  The SARS-CoV-2 RNA is generally detectable in upper respiratory specimens during the acute phase of infection. The lowest concentration of SARS-CoV-2 viral copies this assay can detect is 138 copies/mL. A negative result does not preclude SARS-Cov-2 infection and should not be used as the sole basis for treatment or other patient management decisions. A negative result may occur with  improper  specimen collection/handling, submission of specimen other than nasopharyngeal swab, presence of viral mutation(s) within the areas targeted by this assay, and inadequate number of viral copies(<138 copies/mL). A negative result must be combined with clinical observations, patient history, and epidemiological information. The expected result is Negative.  Fact Sheet for Patients:  BloggerCourse.com  Fact Sheet for Healthcare Providers:  SeriousBroker.it  This test is no t yet approved or cleared by the [place] FDA and  has been authorized for detection and/or diagnosis of SARS-CoV-2 by FDA under an Emergency Use Authorization (EUA). This EUA will remain  in effect (meaning this test can be used) for the duration of the COVID-19 declaration under Section 564(b)(1) of the Act, 21 U.S.C.section 360bbb-3(b)(1), unless the authorization is terminated  or revoked sooner.       Influenza A by PCR NEGATIVE NEGATIVE Final   Influenza B by PCR NEGATIVE NEGATIVE Final    Comment: (NOTE) The Xpert Xpress SARS-CoV-2/FLU/RSV plus assay is intended as an aid in the diagnosis of influenza from Nasopharyngeal swab specimens and should not be used as a sole basis for treatment. Nasal washings and aspirates are unacceptable for Xpert Xpress SARS-CoV-2/FLU/RSV testing.  Fact Sheet for Patients: BloggerCourse.com  Fact Sheet for Healthcare Providers: SeriousBroker.it  This test is not yet approved or cleared by the [place] FDA and has been authorized for detection and/or diagnosis of SARS-CoV-2 by FDA under an Emergency Use Authorization (EUA). This EUA will remain in effect (meaning this test can be used) for the duration of the COVID-19 declaration under Section 564(b)(1) of the Act, 21 U.S.C. section 360bbb-3(b)(1), unless the authorization is terminated or revoked.     Resp  Syncytial Virus by PCR NEGATIVE NEGATIVE Final    Comment: (NOTE) Fact Sheet for Patients: BloggerCourse.com  Fact Sheet for Healthcare Providers: SeriousBroker.it  This test is not yet approved or cleared by the [place] FDA and has been authorized for detection and/or diagnosis of SARS-CoV-2 by FDA under an Emergency Use Authorization (EUA). This EUA will remain in effect (meaning this test can be used) for the duration of the COVID-19 declaration under Section 564(b)(1) of the Act, 21 U.S.C. section 360bbb-3(b)(1), unless the authorization is terminated or revoked.  Performed at [facility] [address] [place], [place] [address]      Radiology Studies: DG Chest Portable 1 View Result Date: 05/21/2023 CLINICAL DATA:  Shortness of breath EXAM: PORTABLE CHEST 1 VIEW COMPARISON:  05/13/2023 of FINDINGS: Examination limited by patient kyphosis and chin obscuring the lung apices. Stable heart size dad is post sternotomy and CABG. Aortic atherosclerosis. Low lung volumes. Probable mild bibasilar atelectasis. No large pleural fluid collection. No pneumothorax within the limitations of this study. IMPRESSION: Low lung volumes with probable  mild bibasilar atelectasis. Electronically Signed   By: Duanne Guess D.O.   On: 05/21/2023 16:03     Pamella Pert, MD, PhD [facility]  Between 7 am - 7 pm I am available, please contact me via Amion (for emergencies) or Securechat (non urgent messages)  Between 7 pm - 7 am I am not available, please contact night coverage MD/APP via Amion

## 2023-05-22 NOTE — Progress Notes (Signed)
Nutrition Brief Note  Received consult for assessment of nutrition status. Palliative care saw patient this AM chart reviewed. Pt now transitioning to comfort care.  No nutrition interventions planned at this time.  Please re-consult as needed.   Shelle Iron RD, LDN Contact via [employer].

## 2023-05-22 NOTE — Progress Notes (Signed)
   05/22/23 1100  Spiritual Encounters  Type of Visit Initial  Care provided to: Pt and family  Conversation partners present during encounter Nurse  Referral source Family  Reason for visit Urgent spiritual support  OnCall Visit No   Responded to consult. Met with patient and daughter, Nedra Hai. Provided spiritual care and prayer. patient being moved to comfort care.  Patient and family comfortable with their decision. Older daughter is flying in tomorrow. Patient wants to see her mother (who is in heaven) and reaches for her. Patient makes contact and responds via mourns, grunts and facial expressions.

## 2023-05-22 NOTE — Consult Note (Signed)
Consultation Note Date: 05/22/2023   Patient Name: Kristin [name]  DOB: May 26, 1940  MRN: [id number]  Age / Sex: 83 y.o., female   PCP: Sharon Seller, NP Referring Physician: Leatha Gilding, MD  Reason for Consultation: Establishing goals of care     Chief Complaint/History of Present Illness:   Patient is an 83 year old female with a past medical history of prior CVAs with noted residual right-sided weakness and aphasia, history of CAD, hyperlipidemia, and constipation who was admitted on 05/21/2023 from her SNF for management of shortness of breath.  Of note patient had recently had another stroke earlier this month which led to left-sided deficits.  Family noted patient has been having significant decline since this most recent stroke.  Since admission, patient has received management for acute hypoxic respiratory failure in the setting of aspiration pneumonia, recurrent CVAs with noted weakness, aphasia, and dysphagia.  Palliative medicine team consulted to assist with complex medical decision making.  Extensive review of EMR prior to presenting to bedside.  Leukocytosis noted to be elevated to 18.1. Discussed care with bedside RN and hospitalist for updates as well.  Presented to bedside to meet with patient.  Patient laying awake in the bed.  Patient unable to speak and can only raise left arm in the air.  Patient's stepdaughter, Kristin [name], present at bedside.  Introduced myself and the role of the palliative medicine team.  Kristin [name] able to call her sister, Kristin [name], to be involved in conversation on speaker phone since she is patient's HCPOA.  Spent time learning about patient's medical history and rapid deterioration over the past month.  With permission, discussed pathways for medical care moving forward.  Family expressed that patient's quality of life was very important to her.  They noted that patient would not find her current condition to be quality of life for her.  Discussed priority is  focusing on comfort at this time.  Detailed what this would entail such as discontinuing interventions no longer focused on comfort such as IV fluids, antibiotics, imaging, and lab work.  Discussed would instead provide medications for pain, dyspnea, agitation, and nausea/vomiting.  Discussed would allow patient to eat for comfort knowing patient has aspiration risk.  Family agreeing with transition to comfort focused care at this time.  Also able to discuss involvement of hospice moving forward to support comfort focused care in the outpatient setting.  Family discussed that patient had been at rehab though the plan was for her to transition over to long-term care at [facility].  Discussed how people can receive hospice support in the long-term care setting.  Also discussed that if patient deteriorates or is requiring multiple IV medications for symptom management, may be appropriate for inpatient hospice.  Discussed that 1 cannot just go to inpatient hospice, has to be medically appropriate for this.  Family agreeing with transition to comfort focused care at this time and will continue to review patient's care to determine best [facility] setting for comfort moving forward whether that be long-term care with hospice versus inpatient hospice referral.  Family did specifically request that if inpatient hospice referral was made, they would want patient to remain here in [place] so they are still able to visit with her.  They had family that went to [facility] and passed away there so that would be their preference.  Acknowledged this.  Spent time answering questions as able and providing emotional support reactive listening.  Noted palliative medicine team and continue to follow along with patient's  medical journey.  Updated IDT regarding transition to comfort focused care at this time.  Primary Diagnoses  Present on Admission:  Acute respiratory failure with hypoxia [facility])   Past Medical History:   Diagnosis Date   Arthritis    hands   CAD (coronary artery disease)    a. 2010: stents pRCA and dRCA. b. LHC 12/13/13: Minimal distal LM dz. LAD 95% ostial w/ L-->R collat. LCx mild dz. RCA 95% mid-dRCA. Patent stents proximal and distal RCA. EF not assessed, Nl by nuclear study.      Central retinal vein occlusion, right eye    Chronic kidney disease    hx kidney stone   Depression    Diplopia    Ear pain    right   Eosinophilic esophagitis    Essential hypertension    GERD (gastroesophageal reflux disease)    H/O: whooping cough    Hyperlipidemia with target LDL less than 70    Incontinence    urine   Stroke (HCC) 2001   reesultant Rt spastic hemplegia and aphasia    Social History   Socioeconomic History   Marital status: Married    Spouse name: Not on file   Number of children: 2   Years of education: Not on file   Highest education level: Bachelor's degree (e.g., BA, AB, BS)  Occupational History   Occupation: retired Engineer, site  Tobacco Use   Smoking status: Never   Smokeless tobacco: Never  Vaping Use   Vaping status: Never Used  Substance and Sexual Activity   Alcohol use: Yes    Comment: rarely   Drug use: Never   Sexual activity: Never  Other Topics Concern   Not on file  Social History Narrative   Not on file   Social Drivers of Health   Financial Resource Strain: Low Risk  (05/13/2023)   Overall Financial Resource Strain (CARDIA)    Difficulty of Paying Living Expenses: Not hard at all  Food Insecurity: Patient Unable To Answer (05/22/2023)   Hunger Vital Sign    Worried About Programme researcher, broadcasting/film/video in the Last Year: Patient unable to answer    Ran Out of Food in the Last Year: Patient unable to answer  Transportation Needs: Patient Unable To Answer (05/22/2023)   PRAPARE - Transportation    Lack of Transportation (Medical): Patient unable to answer    Lack of Transportation (Non-Medical): Patient unable to answer  Physical Activity: Unknown  (05/13/2023)   Exercise Vital Sign    Days of Exercise per Week: 0 days    Minutes of Exercise per Session: Not on file  Stress: Patient Declined (05/13/2023)   [employer] of Occupational Health - Occupational Stress Questionnaire    Feeling of Stress : Patient declined  Social Connections: Patient Unable To Answer (05/22/2023)   Social Connection and Isolation Panel [NHANES]    Frequency of Communication with Friends and Family: Patient unable to answer    Frequency of Social Gatherings with Friends and Family: Patient unable to answer    Attends Religious Services: Patient unable to answer    Active Member of Clubs or Organizations: Patient unable to answer    Attends Banker Meetings: Patient unable to answer    Marital Status: Patient unable to answer  Recent Concern: Social Connections - Socially Isolated (05/13/2023)   Social Connection and Isolation Panel [NHANES]    Frequency of Communication with Friends and Family: Three times a week    Frequency of  Social Gatherings with Friends and Family: Twice a week    Attends Religious Services: Never    Database administrator or Organizations: No    Attends Engineer, structural: Not on file    Marital Status: Widowed   Family History  Problem Relation Age of Onset   Diabetes Mother    Heart disease Mother    Heart attack Father    Breast cancer Sister    Scheduled Meds:  Chlorhexidine Gluconate Cloth  6 each Topical Daily   enoxaparin (LOVENOX) injection  40 mg Subcutaneous Q24H   Continuous Infusions:  dextrose 5 % and 0.45 % NaCl 75 mL/hr at 05/22/23 0826   PRN Meds:.acetaminophen **OR** acetaminophen, hydrALAZINE, ondansetron **OR** ondansetron (ZOFRAN) IV, mouth rinse, senna-docusate No Known Allergies CBC:    Component Value Date/Time   WBC 18.1 (H) 05/22/2023 0328   HGB 15.1 (H) 05/22/2023 0328   HCT 52.2 (H) 05/22/2023 0328   PLT 387 05/22/2023 0328   MCV 91.3 05/22/2023 0328   NEUTROABS  13.4 (H) 05/21/2023 1550   LYMPHSABS 0.5 (L) 05/21/2023 1550   MONOABS 1.4 (H) 05/21/2023 1550   EOSABS 0.1 05/21/2023 1550   BASOSABS 0.1 05/21/2023 1550   Comprehensive Metabolic Panel:    Component Value Date/Time   NA 148 (H) 05/22/2023 0328   NA 141 10/29/2018 0000   K 4.2 05/22/2023 0328   CL 111 05/22/2023 0328   CO2 29 05/22/2023 0328   BUN 30 (H) 05/22/2023 0328   BUN 21 10/29/2018 0000   CREATININE 0.85 05/22/2023 0328   CREATININE 0.66 03/10/2023 1335   GLUCOSE 181 (H) 05/22/2023 0328   CALCIUM 8.9 05/22/2023 0328   AST 12 03/10/2023 1335   ALT 10 03/10/2023 1335   ALKPHOS 70 05/22/2022 2039   BILITOT 0.5 03/10/2023 1335   PROT 6.6 03/10/2023 1335   ALBUMIN 4.1 05/22/2022 2039    Physical Exam: Vital Signs: BP (!) 157/118 (BP Location: Left Wrist) Comment: Pt arm raised- MD aware could be inaccurate  Pulse 91   Temp 98.2 F (36.8 C) (Oral)   Resp (!) 22   SpO2 98%  SpO2: SpO2: 98 % O2 Device: O2 Device: (S) Nasal Cannula O2 Flow Rate: O2 Flow Rate (L/min): 5 L/min Intake/output summary:  Intake/Output Summary (Last 24 hours) at 05/22/2023 0850 Last data filed at 05/22/2023 0846 Gross per 24 hour  Intake --  Output 275 ml  Net -275 ml   LBM: Last BM Date :  (PTA) Baseline Weight:   Most recent weight:    General: NAD, awake, dysphasic, chronically ill-appearing Cardiovascular: RRR Respiratory: Slightly increased work of breathing noted, not in respiratory distress Skin: Ecchymoses present on right forehead from prior fall as per family Neuro: Dysphasic, right hemiparesis, able to lift left arm          Palliative Performance Scale: 10%              Additional Data Reviewed: Recent Labs    05/21/23 1550 05/22/23 0328  WBC 15.5* 18.1*  HGB 15.8* 15.1*  PLT 413* 387  NA 145 148*  BUN 30* 30*  CREATININE 0.99 0.85    Imaging: DG Chest Portable 1 View CLINICAL DATA:  Shortness of breath  EXAM: PORTABLE CHEST 1 VIEW  COMPARISON:   05/13/2023 of  FINDINGS: Examination limited by patient kyphosis and chin obscuring the lung apices. Stable heart size dad is post sternotomy and CABG. Aortic atherosclerosis. Low lung volumes. Probable mild bibasilar atelectasis. No  large pleural fluid collection. No pneumothorax within the limitations of this study.  IMPRESSION: Low lung volumes with probable mild bibasilar atelectasis.  Electronically Signed   By: Duanne Guess D.O.   On: 05/21/2023 16:03    I personally reviewed recent imaging.   Palliative Care Assessment and Plan Summary of Established Goals of Care and Medical Treatment Preferences   Patient is an 83 year old female with a past medical history of prior CVAs with noted residual right-sided weakness and aphasia, history of CAD, hyperlipidemia, and constipation who was admitted on 05/21/2023 from her SNF for management of shortness of breath.  Of note patient had recently had another stroke earlier this month which led to left-sided deficits.  Family noted patient has been having significant decline since this most recent stroke.  Since admission, patient has received management for acute hypoxic respiratory failure in the setting of aspiration pneumonia, recurrent CVAs with noted weakness, aphasia, and dysphagia.  Palliative medicine team consulted to assist with complex medical decision making.  # Complex medical decision making/goals of care  # Complex medical decision making/goals of care  -Patient unable to participate in medical decision making secondary to medical status.  -Spoke with patient's stepdaughters including Kristin [name]/HCPOA.  Discussed possible pathways for medical care moving forward.  Family expressed that patient would not find her current rendition to be quality of life to her.  Patient would want to focus on comfort.  Will not be pursuing further rehab or progressive medical interventions.  Will be transition to comfort focused care at this time.   Will allow patient to eat for comfort knowing she is an aspiration risk.  Allowing time to determine most appropriate place for patient to receive hospice support moving forward whether this be in long-term care facility with hospice versus referral to inpatient hospice for evaluation.  -At this time we will discontinue interventions that are no longer focused on comfort such as IV fluids, imaging, or lab work.  Will instead focus on symptom management of pain, dyspnea, and agitation in the setting of end-of-life care.    Code Status: Do not attempt resuscitation (DNR) - Comfort care  # Symptom management Patient is receiving these palliative interventions for symptom management with an intent to improve quality of life.     -Pain/Dyspnea, acute in the setting of end-of-life care                Patient was not on medications for pain previously.                              -Start IV morphine 1-4 mg every 2 hours as needed for breakthrough or severe pain   -Start oxycodone solution 5 mg every 2 hours as needed                 -Anxiety/agitation, in the setting of end-of-life care                               -Start as needed Ativan and Haldol for agitation                  -Secretions, in the setting of end-of-life care                               -Start as needed glycopyrrolate  #  Psycho-social/Spiritual Support:  - Support System: Stepdaughters including Kristin [name]/HCPOA  # Discharge Planning:  To Be Determined  Thank you for allowing the palliative care team to participate in the care Kristin [name].  Alvester Morin, DO Palliative Care Provider PMT # [phone]  If patient remains symptomatic despite maximum doses, please call PMT at [phone] between 0700 and 1900. Outside of these hours, please call attending, as PMT does not have night coverage.

## 2023-05-22 NOTE — Progress Notes (Signed)
PT Cancellation Note  Patient Details Name: Kristin [name] MRN: [id number] DOB: 11-Dec-1940   Cancelled Treatment:    Reason Eval/Treat Not Completed: Medical issues which prohibited therapy Palliative  is consulted. Will follow if PT indicated vs. Sign off. Blanchard Kelch PT Acute Rehabilitation Services Office [phone] Weekend [phone]   Rada Hay 05/22/2023, 9:06 AM

## 2023-05-22 NOTE — Progress Notes (Signed)
  Daily Progress Note   Patient Name: Kristin [name]       Date: 05/22/2023 DOB: 1940-10-19  Age: 83 y.o. MRN#: [id number] Attending Physician: Leatha Gilding, MD Primary Care Physician: Sharon Seller, NP Admit Date: 05/21/2023 Length of Stay: 1 day  Will place full consult note as soon as able. Talked with patient's step-daughters including HCPOA. Transitioning to full comfort focused care at this time. Orders approprietly updated.   Alvester Morin, DO Palliative Care Provider PMT # [phone]

## 2023-05-22 NOTE — ED Provider Notes (Signed)
  Physical Exam  BP 112/60   Pulse 90   Temp 98.2 F (36.8 C) (Oral)   Resp (!) 26   Wt 85.2 kg   SpO2 99%   BMI 31.26 kg/m   Physical Exam Vitals and nursing note reviewed.  Constitutional:      General: She is not in acute distress.    Appearance: She is well-developed.  HENT:     Head: Normocephalic and atraumatic.  Eyes:     Conjunctiva/sclera: Conjunctivae normal.  Cardiovascular:     Rate and Rhythm: Normal rate and regular rhythm.     Heart sounds: No murmur heard. Pulmonary:     Effort: Accessory muscle usage and respiratory distress present.     Breath sounds: Normal breath sounds.  Abdominal:     Palpations: Abdomen is soft.     Tenderness: There is no abdominal tenderness.  Musculoskeletal:        General: No swelling.     Cervical back: Neck supple.  Skin:    General: Skin is warm and dry.     Capillary Refill: Capillary refill takes less than 2 seconds.  Neurological:     Mental Status: She is alert.  Psychiatric:        Mood and Affect: Mood normal.     Procedures  .Critical Care  Performed by: Glendora Score, MD Authorized by: Glendora Score, MD   Critical care provider statement:    Critical care time (minutes):  30   Critical care was necessary to treat or prevent imminent or life-threatening deterioration of the following conditions:  Respiratory failure   Critical care was time spent personally by me on the following activities:  Development of treatment plan with patient or surrogate, discussions with consultants, evaluation of patient's response to treatment, examination of patient, ordering and review of laboratory studies, ordering and review of radiographic studies, ordering and performing treatments and interventions, pulse oximetry, re-evaluation of patient's condition and review of old charts   ED Course / MDM    Medical Decision Making Amount and/or Complexity of Data Reviewed Labs: ordered. Radiology: ordered.  Risk Decision  regarding hospitalization.   Patient received in handoff.  Respiratory distress and concern for possible aspiration.  Pending x-ray and completion of laboratory evaluation.  Laboratory evaluation with leukocytosis to 15.5 but is otherwise unremarkable.  Chest x-ray with mild bibasilar atelectasis.  Given suspected aspiration event causing profound respiratory distress and need for BiPAP and I did cover with Un[employer].  Will require hospitalization.       Glendora Score, MD 05/22/23 [phone]

## 2023-05-23 DIAGNOSIS — Z515 Encounter for palliative care: Secondary | ICD-10-CM | POA: Diagnosis not present

## 2023-05-23 DIAGNOSIS — Z66 Do not resuscitate: Secondary | ICD-10-CM | POA: Diagnosis not present

## 2023-05-23 DIAGNOSIS — Z7189 Other specified counseling: Secondary | ICD-10-CM | POA: Diagnosis not present

## 2023-05-23 DIAGNOSIS — Z79899 Other long term (current) drug therapy: Secondary | ICD-10-CM | POA: Diagnosis not present

## 2023-05-23 DIAGNOSIS — R4589 Other symptoms and signs involving emotional state: Secondary | ICD-10-CM

## 2023-05-23 DIAGNOSIS — J9601 Acute respiratory failure with hypoxia: Secondary | ICD-10-CM | POA: Diagnosis not present

## 2023-05-23 MED ORDER — OXYCODONE HCL 20 MG/ML PO CONC: 5.0000 mg | ORAL | 0 refills | Status: DC | PRN

## 2023-05-23 MED ORDER — LORAZEPAM 2 MG/ML PO CONC: 1.0000 mg | ORAL | 0 refills | Status: DC | PRN

## 2023-05-23 NOTE — Plan of Care (Signed)
Transitioned to comfort care on 05/22/2023. Assessment of both patient and family progress noted below.  Problem: Clinical Measurements: Goal: Will remain free from infection Outcome: Progressing Goal: Diagnostic test results will improve Outcome: Progressing Goal: Respiratory complications will improve Outcome: Progressing Goal: Cardiovascular complication will be avoided Outcome: Progressing   Problem: Activity: Goal: Risk for activity intolerance will decrease Outcome: Progressing   Problem: Nutrition: Goal: Adequate nutrition will be maintained Outcome: Progressing   Problem: Coping: Goal: Level of anxiety will decrease Outcome: Progressing   Problem: Elimination: Goal: Will not experience complications related to bowel motility Outcome: Progressing Goal: Will not experience complications related to urinary retention Outcome: Progressing   Problem: Pain Managment: Goal: General experience of comfort will improve and/or be controlled Outcome: Progressing   Problem: Safety: Goal: Ability to remain free from injury will improve Outcome: Progressing   Problem: Skin Integrity: Goal: Risk for impaired skin integrity will decrease Outcome: Progressing   Problem: Education: Goal: Knowledge of the prescribed therapeutic regimen will improve Outcome: Progressing   Problem: Coping: Goal: Ability to identify and develop effective coping behavior will improve Outcome: Progressing   Problem: Clinical Measurements: Goal: Quality of life will improve Outcome: Progressing   Problem: Respiratory: Goal: Verbalizations of increased ease of respirations will increase Outcome: Progressing   Problem: Role Relationship: Goal: Family's ability to cope with current situation will improve Outcome: Progressing Goal: Ability to verbalize concerns, feelings, and thoughts to partner or family member will improve Outcome: Progressing   Problem: Pain Management: Goal: Satisfaction  with pain management regimen will improve Outcome: Progressing   Problem: Health Behavior/Discharge Planning: Goal: Ability to manage health-related needs will improve Outcome: Not Applicable (Comfort care)  Lesly Rubenstein. Clelia Croft BSN, RN, CCRP, CCRN 05/23/2023 1:17 AM

## 2023-05-23 NOTE — Progress Notes (Signed)
Everything ready for patient to be transported to [facility] place. IV will remain in place, awaiting for PTAR.

## 2023-05-23 NOTE — Progress Notes (Signed)
[facility] Room 1240  [facility] Liaison Note  Referral received from [facility] for family interest in [facility].   Met with patient and family in room to explain services and hospice philosophy and all questions answered.   [facility] is able to accept patient this afternoon once consents are complete.   RN staff, you may call report at any time to [facility] [place] @ [phone], room is assigned when report is called.   Please leave IV intact and send completed DNR with patient.   Updated attending and [facility] manager via [employer].  Thank you for the opportunity to participate in this patient's care  Roe Rutherford, BSN, RN  Hospice Nurse Liaison  [phone]

## 2023-05-23 NOTE — Progress Notes (Signed)
Daily Progress Note   Patient Name: Kristin [name]       Date: 05/23/2023 DOB: 01-12-1941  Age: 83 y.o. MRN#: [id number] Attending Physician: Leatha Gilding, MD Primary Care Physician: Sharon Seller, NP Admit Date: 05/21/2023 Length of Stay: 2 days  Reason for Consultation/Follow-up: Establishing goals of care  Subjective:   CC: Patient minimally opening eyes when seen today.  Following up regarding complex medical decision making.  Subjective:  Reviewed EMR prior to presenting to bedside.  At time of EMR review in past 24 hours patient has required multiple doses of medications for symptom management including IV morphine and Ativan.  Patient's oxygen saturation decreasing with increased respiratory rate. Discussed care with RN for updates.  Presented to bedside to see patient.  Patient's daughter and sister present at bedside.  Introduced myself as a member of the palliative medicine team.  Able to examine patient who is minimally responsive by opening eyes at times though not tracking.  Patient noted to have increased work of breathing upon examination.  Discussed plan for today.  Family has requested that if patient is to be evaluated for inpatient hospice this would be at [facility] since they have had a family member die there previously.  Noted would involve [facility] liaison for evaluation.  Will be continuing comfort focused measures in the hospital at this time.  Spent time providing emotional support via active listening.  All questions answered at that time.  Thanked family for allowing me to visit with them and patient today.  Attempted to call patient's HCPOA/Kristin [name] without answer.  Did inform family at bedside of this.  They noted that they have already messaged to inform her of plan for inpatient hospice evaluation.  Discussed care with IDT after visit. Objective:   Vital Signs:  BP (!) 151/91 (BP Location: Left Arm)   Pulse 91   Temp 97.7 F (36.5 C) (Oral)   Resp (!) 26    Wt 85.2 kg   SpO2 (!) 89%   BMI 31.26 kg/m   Physical Exam: General: NAD, minimally opening eyes, chronically ill-appearing Cardiovascular: RRR Respiratory: increased work of breathing noted, not in respiratory distress Skin: Ecchymoses present on right forehead from prior fall as per family Neuro: Minimally responsive  Imaging: I personally reviewed recent imaging.   Assessment & Plan:   Assessment: Patient is an 83 year old female with a past medical history of prior CVAs with noted residual right-sided weakness and aphasia, history of CAD, hyperlipidemia, and constipation who was admitted on 05/21/2023 from her SNF for management of shortness of breath. Of note patient had recently had another stroke earlier this month which led to left-sided deficits. Family noted patient has been having significant decline since this most recent stroke. Since admission, patient has received management for acute hypoxic respiratory failure in the setting of aspiration pneumonia, recurrent CVAs with noted weakness, aphasia, and dysphagia. Palliative medicine team consulted to assist with complex medical decision making.   Recommendations/Plan: # Complex medical decision making/goals of care:  -  Patient unable to participate in medical decision making secondary to medical status.                -Patient was transition to comfort care on 05/22/2023 after discussion with patient's HCPOA.  Continuing comfort focused measures at this time.  Based on evaluation today, involving TOC and [facility] liaison for possible inpatient hospice evaluation.  Patient remaining in the hospital until determination regarding this possible transfer.  Code Status: Do not attempt resuscitation (DNR) - Comfort care   # Symptom management Patient is receiving these palliative interventions for symptom management with an intent to improve quality of life.                   -Pain/Dyspnea, acute in the setting of  end-of-life care                Patient was not on medications for pain previously.                              -Continue IV morphine 1-4 mg every 2 hours as needed for breakthrough or severe pain                               -Stop oxycodone                   -Anxiety/agitation, in the setting of end-of-life care                               -Continue as needed Ativan and Haldol for agitation                  -Secretions, in the setting of end-of-life care                               -Continue as needed glycopyrrolate   # Psycho-social/Spiritual Support:  - Support System: Stepdaughters including Kristin [name]/HCPOA; daughter, sister   # Discharge Planning: Anticipated Hospital Death vs transfer to [facility] for inpatient hospice   Discussed with: patient's family at bedside, RN, hospitalist, [facility] liaison   Thank you for allowing the palliative care team to participate in the care Kristin [name].  Alvester Morin, DO Palliative Care Provider PMT # [phone]  If patient remains symptomatic despite maximum doses, please call PMT at [phone] between 0700 and 1900. Outside of these hours, please call attending, as PMT does not have night coverage.  Personally spent 50 minutes in patient care including extensive chart review (labs, imaging, progress/consult notes, vital signs), medically appropraite exam, discussed with treatment team, education to patient, family, and staff, documenting clinical information, medication review and management, coordination of care, and available advanced directive documents.

## 2023-05-23 NOTE — TOC Progression Note (Addendum)
Transition of Care [facility]) - Progression Note    Patient Details  Name: Kristin [name] MRN: [id number] Date of Birth: January 21, 1941  [facility] of [facility] CM/SW Contact  Erin Sons, [place] Phone Number: 05/23/2023, 8:57 AM  Clinical Narrative:     Pt admitted from [facility]. CSW confirmed with [facility] that pt was there for short term rehab and would require insurance auth if she were to return. Pt is currently comfort care. TOC will follow to assist with any disposition needs.   1050: CSW notified by PMT via secure chat that pt appropriate for hospice facility. Informed that family specifically requested [facility]. [facility] liaison was included in secure chat. They will review pt for possible bed at [facility].   Expected Discharge Plan:  (TBD) Barriers to Discharge: Continued Medical Work up                 Social Determinants of Health (SDOH) Interventions SDOH Screenings   Food Insecurity: Patient Unable To Answer (05/22/2023)  Housing: Low Risk  (05/22/2023)  Transportation Needs: Patient Unable To Answer (05/22/2023)  Utilities: Patient Unable To Answer (05/22/2023)  Depression (PHQ2-9): Low Risk  (03/10/2023)  Financial Resource Strain: Low Risk  (05/13/2023)  Physical Activity: Unknown (05/13/2023)  Social Connections: Patient Unable To Answer (05/22/2023)  Recent Concern: Social Connections - Socially Isolated (05/13/2023)  Stress: Patient Declined (05/13/2023)  Tobacco Use: Low Risk  (05/22/2023)    Readmission Risk Interventions     No data to display

## 2023-05-23 NOTE — Discharge Summary (Signed)
Physician Discharge Summary  Shakiah Wester ONG:[id number] DOB: Jan 27, 1941 DOA: 05/21/2023  PCP: Sharon Seller, NP  Admit date: 05/21/2023 Discharge date: 05/23/2023  Admitted From: home Disposition:  residential hospice  Discharge Condition: stable CODE STATUS: DNR Diet Orders (From admission, onward)     Start     Ordered   05/22/23 1107  Diet regular Room service appropriate? Yes; Fluid consistency: Thin  Diet effective now       Question Answer Comment  Room service appropriate? Yes   Fluid consistency: Thin      05/22/23 1106            Brief Narrative / Interim history: 83 year old female with prior CVAs, residual right-sided weakness and aphasia, most recent CVA January 2025, history of CAD status post stenting comes into the hospital from SNF for shortness of breath.  Following her most recent stroke she apparently has been having a significant decline.  She recently moved from her independent living facility to SNF last month.  She has been having difficulties eating even her thickened diet.  Had a probable aspiration event, EMS was called and she was brought to the hospital.  She was found to be significantly hypoxic, have leukocytosis, and was admitted to the hospital.  Given progressive decline, palliative consulted and eventually transition to comfort care  Hospital Course / Discharge diagnoses: Principal Problem:   Acute respiratory failure with hypoxia (HCC) Active Problems:   Aspiration pneumonia [facility])   Palliative care encounter   Goals of care, counseling/discussion   Counseling and coordination of care   Medication management   High risk medication use   DNR (do not resuscitate)   Shortness of breath  Principal problem Acute hypoxic respiratory failure due to aspiration pneumonia, sepsis -this is likely in the setting of worsening dysphagia given recent CVA. Patient met sepsis criteria with tachycardia, tachypnea, leukocytosis and a source. She is not  doing well even with thickened fluids per family. Palliative consulted, now transition to full comfort  Active problems Recurrent CVAs, right hemiplegia, aphasia, dysphagia -with apparent significant decline in the last several weeks after most recent CVA.  MRI of the brain done 05/13/2023 showed acute/subacute infarct of right basal ganglia and regional white matter tracts, and also old left MCA infarction with atrophy, encephalomalacia and gliosis.  She was also found to have chronic small vessel ischemic changes of bilateral white matter as well as the pons. This is likely to be an irreversible condition, will transition to comfort approach.  Hypernatremia Hyperglycemia Essential hypertension Leukocytosis Thrombocytosis  Hyperlipidemia CAD   Sepsis ruled out   Discharge Instructions   Allergies as of 05/23/2023   No Known Allergies      Medication List     STOP taking these medications    amLODipine 5 MG tablet Commonly known as: NORVASC   atorvastatin 40 MG tablet Commonly known as: LIPITOR   COQ-10 PO   DULoxetine 20 MG capsule Commonly known as: CYMBALTA   ferrous sulfate 325 (65 FE) MG tablet   omeprazole 20 MG capsule Commonly known as: PRILOSEC   potassium chloride 10 MEQ tablet Commonly known as: KLOR-CON M   TURMERIC PO   Vitamin D3 50 MCG (2000 UT) capsule       TAKE these medications    acetaminophen 500 MG tablet Commonly known as: TYLENOL Take 1,000 mg by mouth 3 (three) times daily as needed for mild pain (pain score 1-3), headache or fever.   aspirin 81 MG tablet  Take 81 mg by mouth in the morning.   clopidogrel 75 MG tablet Commonly known as: PLAVIX TAKE ONE TABLET BY MOUTH DAILY What changed: when to take this   diclofenac Sodium 1 % Gel Commonly known as: VOLTAREN APPLY TO THE AFFECTED AREA(S) THREE TIMES DAILY. What changed: See the new instructions.   docusate sodium 100 MG capsule Commonly known as: COLACE Take 100 mg  by mouth daily.   estradiol 0.1 MG/GM vaginal cream Commonly known as: ESTRACE Place 1 Applicatorful vaginally every Monday, Wednesday, and Friday.   feeding supplement Liqd Take 237 mLs by mouth 2 (two) times daily between meals.   gabapentin 300 MG capsule Commonly known as: NEURONTIN TAKE ONE CAPSULE BY MOUTH THREE TIMES DAILY What changed: when to take this   LORazepam 2 MG/ML concentrated solution Commonly known as: ATIVAN Place 0.5 mLs (1 mg total) under the tongue every 4 (four) hours as needed for anxiety.   methocarbamol 500 MG tablet Commonly known as: ROBAXIN Take 1 tablet (500 mg total) by mouth every 8 (eight) hours as needed for muscle spasms.   mirabegron ER 50 MG Tb24 tablet Commonly known as: MYRBETRIQ Take 50 mg by mouth in the morning.   nitroGLYCERIN 0.4 MG SL tablet Commonly known as: Nitrostat Place 1 tablet (0.4 mg total) under the tongue every 5 (five) minutes as needed for chest pain.   oxyCODONE 20 MG/ML concentrated solution Commonly known as: ROXICODONE INTENSOL Take 0.3 mLs (6 mg total) by mouth every 2 (two) hours as needed for moderate pain (pain score 4-6) (or dyspnea).   pantoprazole 40 MG tablet Commonly known as: PROTONIX Take 1 tablet (40 mg total) by mouth daily.   polyethylene glycol 17 g packet Commonly known as: MIRALAX / GLYCOLAX Take 17 g by mouth daily. What changed:  when to take this reasons to take this   sennosides-docusate sodium 8.6-50 MG tablet Commonly known as: SENOKOT-S Take 1 tablet by mouth at bedtime.   trimethoprim 100 MG tablet Commonly known as: TRIMPEX Take 100 mg by mouth every evening.       Consultations: Palliative care  Procedures/Studies:  DG Chest Portable 1 View Result Date: 05/21/2023 CLINICAL DATA:  Shortness of breath EXAM: PORTABLE CHEST 1 VIEW COMPARISON:  05/13/2023 of FINDINGS: Examination limited by patient kyphosis and chin obscuring the lung apices. Stable heart size dad is post  sternotomy and CABG. Aortic atherosclerosis. Low lung volumes. Probable mild bibasilar atelectasis. No large pleural fluid collection. No pneumothorax within the limitations of this study. IMPRESSION: Low lung volumes with probable mild bibasilar atelectasis. Electronically Signed   By: Duanne Guess D.O.   On: 05/21/2023 16:03   ECHOCARDIOGRAM COMPLETE Result Date: 05/14/2023    ECHOCARDIOGRAM REPORT   Patient Name:   Kristin [name] Date of Exam: 05/14/2023 Medical Rec #:  [id number]  Height:       65.0 in Accession #:    [id number] Weight:       185.2 lb Date of Birth:  09-20-1940   BSA:          1.914 m Patient Age:    83 years   BP:           156/92 mmHg Patient Gender: F          HR:           94 bpm. Exam Location:  Inpatient Procedure: 2D Echo, Cardiac Doppler, Color Doppler and Intracardiac  Opacification Agent Indications:    Stroke  History:        Patient has prior history of Echocardiogram examinations, most                 recent 07/07/2019. CAD, Stroke and PAD; Risk                 Factors:Hypertension.  Sonographer:    Amy Chionchio Referring Phys: [id number] [facility] POKHREL IMPRESSIONS  1. Left ventricular ejection fraction, by estimation, is 60 to 65%. The left ventricle has normal function. The left ventricle has no regional wall motion abnormalities. There is mild left ventricular hypertrophy. Left ventricular diastolic parameters are consistent with Grade I diastolic dysfunction (impaired relaxation).  2. Right ventricular systolic function is mildly reduced. The right ventricular size is normal.  3. The mitral valve is normal in structure. Trivial mitral valve regurgitation.  4. The aortic valve is tricuspid. Aortic valve regurgitation is trivial. Mild aortic valve stenosis. Vmax 2.5 m/s, MG 13 mmHg, DI 0.42 FINDINGS  Left Ventricle: Left ventricular ejection fraction, by estimation, is 60 to 65%. The left ventricle has normal function. The left ventricle has no regional wall motion  abnormalities. The left ventricular internal cavity size was normal in size. There is  mild left ventricular hypertrophy. Left ventricular diastolic parameters are consistent with Grade I diastolic dysfunction (impaired relaxation). Right Ventricle: The right ventricular size is normal. No increase in right ventricular wall thickness. Right ventricular systolic function is mildly reduced. Left Atrium: Left atrial size was not well visualized. Right Atrium: Right atrial size was not well visualized. Pericardium: There is no evidence of pericardial effusion. Mitral Valve: The mitral valve is normal in structure. Trivial mitral valve regurgitation. MV peak gradient, 8.1 mmHg. The mean mitral valve gradient is 3.0 mmHg. Tricuspid Valve: The tricuspid valve is normal in structure. Tricuspid valve regurgitation is trivial. Aortic Valve: The aortic valve is tricuspid. Aortic valve regurgitation is trivial. Mild aortic stenosis is present. Aortic valve mean gradient measures 8.2 mmHg. Aortic valve peak gradient measures 14.0 mmHg. Aortic valve area, by VTI measures 1.68 cm. Pulmonic Valve: The pulmonic valve was not well visualized. Pulmonic valve regurgitation is not visualized. Aorta: The aortic root and ascending aorta are structurally normal, with no evidence of dilitation. IAS/Shunts: The interatrial septum was not well visualized.  LEFT VENTRICLE PLAX 2D LVIDd:         4.30 cm   Diastology LVIDs:         3.10 cm   LV e' medial:    3.70 cm/s LV PW:         1.10 cm   LV E/e' medial:  17.2 LV IVS:        0.90 cm   LV e' lateral:   5.11 cm/s LVOT diam:     2.00 cm   LV E/e' lateral: 12.4 LV SV:         44 LV SV Index:   23 LVOT Area:     3.14 cm  RIGHT VENTRICLE TAPSE (M-mode): 1.2 cm AORTIC VALVE                     PULMONIC VALVE AV Area (Vmax):    1.49 cm      PV Vmax:       0.95 m/s AV Area (Vmean):   1.51 cm      PV Peak grad:  3.6 mmHg AV Area (VTI):     1.68 cm AV  Vmax:           187.20 cm/s AV Vmean:           128.420 cm/s AV VTI:            0.261 m AV Peak Grad:      14.0 mmHg AV Mean Grad:      8.2 mmHg LVOT Vmax:         88.65 cm/s LVOT Vmean:        61.850 cm/s LVOT VTI:          0.140 m LVOT/AV VTI ratio: 0.53  AORTA Ao Root diam: 3.00 cm Ao Asc diam:  3.70 cm MITRAL VALVE                TRICUSPID VALVE MV Area (PHT): 4.21 cm     TR Peak grad:   27.2 mmHg MV Area VTI:   2.07 cm     TR Vmax:        261.00 cm/s MV Peak grad:  8.1 mmHg MV Mean grad:  3.0 mmHg     SHUNTS MV Vmax:       1.42 m/s     Systemic VTI:  0.14 m MV Vmean:      76.3 cm/s    Systemic Diam: 2.00 cm MV Decel Time: 180 msec MV E velocity: 63.60 cm/s MV A velocity: 126.00 cm/s MV E/A ratio:  0.50 Epifanio Lesches MD Electronically signed by Epifanio Lesches MD Signature Date/Time: 05/14/2023/1:27:23 PM    Final    CT ANGIO HEAD NECK W WO CM Result Date: 05/13/2023 CLINICAL DATA:  Neuro deficit, acute, stroke suspected. Unwitnessed fall. Found on the floor. Speech disturbance. Acute right basal ganglia infarction by CT. EXAM: CT ANGIOGRAPHY HEAD AND NECK WITH AND WITHOUT CONTRAST TECHNIQUE: Multidetector CT imaging of the head and neck was performed using the standard protocol during bolus administration of intravenous contrast. Multiplanar CT image reconstructions and MIPs were obtained to evaluate the vascular anatomy. Carotid stenosis measurements (when applicable) are obtained utilizing NASCET criteria, using the distal internal carotid diameter as the denominator. RADIATION DOSE REDUCTION: This exam was performed according to the departmental dose-optimization program which includes automated exposure control, adjustment of the mA and/or kV according to patient size and/or use of iterative reconstruction technique. CONTRAST:  75mL OMNIPAQUE IOHEXOL 350 MG/ML SOLN COMPARISON:  CT and MRI same day FINDINGS: CTA NECK FINDINGS Aortic arch: Previous median sternotomy and CABG. Branching pattern is normal without origin stenosis. Right  carotid system: Common carotid artery is tortuous but widely patent to the bifurcation. Calcified plaque at the carotid bifurcation and ICA bulb. Minimal diameter of the ICA bulb is the same as the more distal cervical ICA, therefore no stenosis. Left carotid system: Common carotid artery is tortuous but widely patent to the bifurcation. Calcified plaque at the carotid bifurcation and ICA bulb. Minimal diameter in proximal bulb measures 1.8 mm. Compared to a more distal cervical ICA diameter of 4.5 mm, this indicates a 60% stenosis. Beyond that, the vessel is patent to the skull base. Vertebral arteries: No proximal subclavian stenosis. Calcified plaque adjacent to the right vertebral artery origin but no stenosis greater than 30%. No left vertebral artery origin stenosis. The vessels are then patent through the cervical region to the foramen magnum. Skeleton: Chronic cervical fusion C2 through C6. Chronic degenerative changes above and below that. Other neck: No mass or lymphadenopathy. Upper chest: Lung apices are clear. Review of the MIP images confirms the above  findings CTA HEAD FINDINGS Anterior circulation: Both internal carotid arteries are patent through the skull base and siphon regions. There is siphon atherosclerotic calcification but no stenosis greater than 50% suspected. On the right, the anterior and middle cerebral vessels are patent. There is some atherosclerotic irregularity of the M1 segment on the right but no flow limiting stenosis. On the left, the anterior cerebral arteries patent. There is chronic occlusion of the left middle cerebral artery. Posterior circulation: Both vertebral arteries are patent through the foramen magnum to the basilar artery. There is atherosclerotic disease of both vertebral artery V4 segments with stenosis estimated at 50% on both sides. The basilar artery shows some atherosclerotic irregularity but no flow limiting stenosis. Posterior circulation branch vessels show  flow. Right PCA takes origin from the anterior circulation. There is moderate stenosis of the proximal left P1 segment. Venous sinuses: Patent and normal. Anatomic variants: None significant. Review of the MIP images confirms the above findings IMPRESSION: 1. No acute vascular finding. Right M1 segment show some atherosclerotic irregularity but no flow limiting stenosis. 2. Atherosclerotic disease at both carotid bifurcations. 60% stenosis of the proximal left ICA bulb. No right ICA stenosis. 3. Atherosclerotic disease of both vertebral artery V4 segments with stenosis estimated at 50% on both sides. 4. Atherosclerotic disease of both carotid siphon regions with bilateral stenosis estimated at 50%. 5. 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 6. Moderate stenosis of the proximal left P1 segment. Electronically Signed   By: Paulina Fusi M.D.   On: 05/13/2023 19:06   MR BRAIN WO CONTRAST Result Date: 05/13/2023 CLINICAL DATA:  Neuro deficit, acute, stroke suspected. Unwitnessed fall and confusion. Speech disturbance. EXAM: MRI HEAD WITHOUT CONTRAST TECHNIQUE: Multiplanar, multiecho pulse sequences of the brain and surrounding structures were obtained without intravenous contrast. COMPARISON:  Head CT earlier same day FINDINGS: Brain: Acute/subacute infarction in the right basal ganglia and regional white matter tracks. Mild swelling but no significant mass effect. No hemorrhage. No other acute infarction. Old left middle cerebral artery territory infarction with atrophy, encephalomalacia and gliosis. Ex vacuo enlargement of the left lateral ventricle. Chronic small-vessel ischemic changes of the white matter of both hemispheres. Chronic small-vessel ischemic changes of the pons. Wallerian degeneration of the brainstem on the left due to the old left MCA stroke. No hydrocephalus. No extra-axial collection. Vascular: Major vessels at the base of the brain show flow. Skull and upper cervical spine:  Negative Sinuses/Orbits: Mild seasonal mucosal thickening. Small right mastoid effusion. Orbits negative. Other: None IMPRESSION: 1. Acute/subacute infarction in the right basal ganglia and regional white matter tracks. Mild swelling but no significant mass effect. No hemorrhage. 2. Old left middle cerebral artery territory infarction with atrophy, encephalomalacia and gliosis. Ex vacuo enlargement of the left lateral ventricle. Wallerian degeneration of the brainstem on the left due to the old left MCA stroke. 3. Chronic small-vessel ischemic changes of the white matter of both hemispheres and of the pons. Electronically Signed   By: Paulina Fusi M.D.   On: 05/13/2023 15:00   DG Chest Portable 1 View Result Date: 05/13/2023 CLINICAL DATA:  Fall. EXAM: PORTABLE CHEST 1 VIEW COMPARISON:  Chest radiograph dated May 07, 2023. FINDINGS: Stable cardiomediastinal silhouette. Prior median sternotomy and CABG. Low lung volumes. Trace left effusion versus pleural thickening. No focal consolidation or pneumothorax. No acute osseous abnormality identified. IMPRESSION: Trace left effusion versus pleural thickening, similar to slightly less prominent compared to the prior exam, which could be somewhat attributable to variations  in positioning. Otherwise, no acute findings in the chest. Electronically Signed   By: Hart Robinsons M.D.   On: 05/13/2023 10:26   DG Knee 2 Views Left Result Date: 05/13/2023 CLINICAL DATA:  Fall and left knee pain. EXAM: LEFT KNEE - 1-2 VIEW COMPARISON:  None Available. FINDINGS: There is no acute fracture or dislocation. The bones are osteopenic. There is severe osteoarthritic changes with tricompartmental narrowing. There is near bone-on-bone contact in the lateral compartment. Small suprapatellar effusion. The soft tissues are unremarkable. Vascular calcifications noted. IMPRESSION: 1. No acute fracture or dislocation. 2. Severe osteoarthritis. Electronically Signed   By: Elgie Collard M.D.   On: 05/13/2023 10:23   CT Cervical Spine Wo Contrast Result Date: 05/13/2023 CLINICAL DATA:  Unwitnessed fall.  Found on the ground. EXAM: CT CERVICAL SPINE WITHOUT CONTRAST TECHNIQUE: Multidetector CT imaging of the cervical spine was performed without intravenous contrast. Multiplanar CT image reconstructions were also generated. RADIATION DOSE REDUCTION: This exam was performed according to the departmental dose-optimization program which includes automated exposure control, adjustment of the mA and/or kV according to patient size and/or use of iterative reconstruction technique. COMPARISON:  02/22/2021 FINDINGS: Alignment: No traumatic malalignment. Skull base and vertebrae: No regional fracture. Chronic fusion from C2 through C5 of the posterior elements. Soft tissues and spinal canal: No traumatic soft tissue finding. Disc levels: Chronic degenerative spondylosis at C5-6 and C6-7 with bilateral bony foraminal narrowing. Upper chest: Negative Other: None IMPRESSION: No acute or traumatic finding. Chronic fusion from C2 through C5 of the posterior elements. Chronic degenerative spondylosis at C5-6 and C6-7 with bilateral bony foraminal narrowing. Electronically Signed   By: Paulina Fusi M.D.   On: 05/13/2023 10:23   CT Head Wo Contrast Result Date: 05/13/2023 CLINICAL DATA:  Headache, increasing frequency or severity. Unwitnessed fall. Found on the floor. EXAM: CT HEAD WITHOUT CONTRAST TECHNIQUE: Contiguous axial images were obtained from the base of the skull through the vertex without intravenous contrast. RADIATION DOSE REDUCTION: This exam was performed according to the departmental dose-optimization program which includes automated exposure control, adjustment of the mA and/or kV according to patient size and/or use of iterative reconstruction technique. COMPARISON:  02/22/2021 FINDINGS: Brain: Old left middle cerebral artery territory infarction with encephalomalacia and gliosis. New  since November 2022 stroke in the right basal ganglia and radiating white matter tracts, possibly subacute. Chronic small-vessel ischemic changes otherwise affecting the white matter. No sign of mass, hemorrhage, hydrocephalus or extra-axial fluid collection Vascular: There is atherosclerotic calcification of the major vessels at the base of the brain. Skull: Negative Sinuses/Orbits: Mild mucosal inflammatory changes of the sinuses. No advanced finding. Orbits negative. Other: None IMPRESSION: New since November 2022 stroke in the right basal ganglia and radiating white matter tracts, possibly subacute. Old left middle cerebral artery territory infarction with encephalomalacia and gliosis. Chronic small-vessel ischemic changes otherwise affecting the white matter. Electronically Signed   By: Paulina Fusi M.D.   On: 05/13/2023 10:20   DG Chest Port 1 View Result Date: 05/07/2023 CLINICAL DATA:  Weakness EXAM: PORTABLE CHEST 1 VIEW COMPARISON:  X-ray 07/07/2019 FINDINGS: Kyphotic rotated x-ray obscures the right apex. Status post median sternotomy. Normal cardiopericardial silhouette. Tortuous ectatic aorta. Slight blunting of the left costophrenic angle. Tiny effusion versus pleural thickening. There is some basilar atelectasis. No pneumothorax or edema. Curvature and degenerative changes of the spine. Osteopenia IMPRESSION: Postop chest. Basilar atelectasis or scar. Tiny left pleural effusion or thickening. Rotated kyphotic x-ray obscures the right lung apex.  Additional imaging as clinically appropriate Electronically Signed   By: Karen Kays M.D.   On: 05/07/2023 14:44    Subjective: Poorly responsive   Discharge Exam: BP (!) 151/91 (BP Location: Left Arm)   Pulse 92   Temp 97.7 F (36.5 C) (Oral)   Resp (!) 26   Wt 85.2 kg   SpO2 (!) 89%   BMI 31.26 kg/m    The results of significant diagnostics from this hospitalization (including imaging, microbiology, ancillary and laboratory) are listed  below for reference.     Microbiology: Recent Results (from the past 240 hours)  Resp panel by RT-PCR (RSV, Flu A&B, Covid) Anterior Nasal Swab     Status: None   Collection Time: 05/21/23  3:33 PM   Specimen: Anterior Nasal Swab  Result Value Ref Range Status   SARS Coronavirus 2 by RT PCR NEGATIVE NEGATIVE Final    Comment: (NOTE) SARS-CoV-2 target nucleic acids are NOT DETECTED.  The SARS-CoV-2 RNA is generally detectable in upper respiratory specimens during the acute phase of infection. The lowest concentration of SARS-CoV-2 viral copies this assay can detect is 138 copies/mL. A negative result does not preclude SARS-Cov-2 infection and should not be used as the sole basis for treatment or other patient management decisions. A negative result may occur with  improper specimen collection/handling, submission of specimen other than nasopharyngeal swab, presence of viral mutation(s) within the areas targeted by this assay, and inadequate number of viral copies(<138 copies/mL). A negative result must be combined with clinical observations, patient history, and epidemiological information. The expected result is Negative.  Fact Sheet for Patients:  BloggerCourse.com  Fact Sheet for Healthcare Providers:  SeriousBroker.it  This test is no t yet approved or cleared by the [place] FDA and  has been authorized for detection and/or diagnosis of SARS-CoV-2 by FDA under an Emergency Use Authorization (EUA). This EUA will remain  in effect (meaning this test can be used) for the duration of the COVID-19 declaration under Section 564(b)(1) of the Act, 21 U.S.C.section 360bbb-3(b)(1), unless the authorization is terminated  or revoked sooner.       Influenza A by PCR NEGATIVE NEGATIVE Final   Influenza B by PCR NEGATIVE NEGATIVE Final    Comment: (NOTE) The Xpert Xpress SARS-CoV-2/FLU/RSV plus assay is intended as an aid in  the diagnosis of influenza from Nasopharyngeal swab specimens and should not be used as a sole basis for treatment. Nasal washings and aspirates are unacceptable for Xpert Xpress SARS-CoV-2/FLU/RSV testing.  Fact Sheet for Patients: BloggerCourse.com  Fact Sheet for Healthcare Providers: SeriousBroker.it  This test is not yet approved or cleared by the [place] FDA and has been authorized for detection and/or diagnosis of SARS-CoV-2 by FDA under an Emergency Use Authorization (EUA). This EUA will remain in effect (meaning this test can be used) for the duration of the COVID-19 declaration under Section 564(b)(1) of the Act, 21 U.S.C. section 360bbb-3(b)(1), unless the authorization is terminated or revoked.     Resp Syncytial Virus by PCR NEGATIVE NEGATIVE Final    Comment: (NOTE) Fact Sheet for Patients: BloggerCourse.com  Fact Sheet for Healthcare Providers: SeriousBroker.it  This test is not yet approved or cleared by the [place] FDA and has been authorized for detection and/or diagnosis of SARS-CoV-2 by FDA under an Emergency Use Authorization (EUA). This EUA will remain in effect (meaning this test can be used) for the duration of the COVID-19 declaration under Section 564(b)(1) of the Act, 21 U.S.C. section  360bbb-3(b)(1), unless the authorization is terminated or revoked.  Performed at [facility], [address], [place], [place] [address]   MRSA Next Gen by PCR, Nasal     Status: None   Collection Time: 05/22/23  8:55 AM   Specimen: Nasal Mucosa; Nasal Swab  Result Value Ref Range Status   MRSA by PCR Next Gen NOT DETECTED NOT DETECTED Final    Comment: (NOTE) The GeneXpert MRSA Assay (FDA approved for NASAL specimens only), is one component of a comprehensive MRSA colonization surveillance program. It is not intended to diagnose MRSA  infection nor to guide or monitor treatment for MRSA infections. Test performance is not FDA approved in patients less than 51 years old. Performed at [facility], [address] [place], [place] [address]      Labs: Basic Metabolic Panel: Recent Labs  Lab 05/21/23 1550 05/22/23 0328  NA 145 148*  K 4.5 4.2  CL 107 111  CO2 25 29  GLUCOSE 213* 181*  BUN 30* 30*  CREATININE 0.99 0.85  CALCIUM 9.3 8.9   Liver Function Tests: No results for input(s): "AST", "ALT", "ALKPHOS", "BILITOT", "PROT", "ALBUMIN" in the last 168 hours. CBC: Recent Labs  Lab 05/21/23 1550 05/22/23 0328  WBC 15.5* 18.1*  NEUTROABS 13.4*  --   HGB 15.8* 15.1*  HCT 53.3* 52.2*  MCV 88.2 91.3  PLT 413* 387   CBG: No results for input(s): "GLUCAP" in the last 168 hours. Hgb A1c No results for input(s): "HGBA1C" in the last 72 hours. Lipid Profile No results for input(s): "CHOL", "HDL", "LDLCALC", "TRIG", "CHOLHDL", "LDLDIRECT" in the last 72 hours. Thyroid function studies No results for input(s): "TSH", "T4TOTAL", "T3FREE", "THYROIDAB" in the last 72 hours.  Invalid input(s): "FREET3" Urinalysis    Component Value Date/Time   COLORURINE YELLOW 05/07/2023 1554   APPEARANCEUR HAZY (A) 05/07/2023 1554   LABSPEC 1.026 05/07/2023 1554   PHURINE 5.0 05/07/2023 1554   GLUCOSEU NEGATIVE 05/07/2023 1554   HGBUR MODERATE (A) 05/07/2023 1554   BILIRUBINUR NEGATIVE 05/07/2023 1554   KETONESUR 5 (A) 05/07/2023 1554   PROTEINUR 30 (A) 05/07/2023 1554   UROBILINOGEN 0.2 12/09/2013 1454   NITRITE NEGATIVE 05/07/2023 1554   LEUKOCYTESUR NEGATIVE 05/07/2023 1554    FURTHER DISCHARGE INSTRUCTIONS:   Get Medicines reviewed and adjusted: Please take all your medications with you for your next visit with your Primary MD   Laboratory/radiological data: Please request your Primary MD to go over all hospital tests and procedure/radiological results at the follow up, please ask your  Primary MD to get all Hospital records sent to his/her office.   In some cases, they will be blood work, cultures and biopsy results pending at the time of your discharge. Please request that your primary care M.D. goes through all the records of your hospital data and follows up on these results.   Also Note the following: If you experience worsening of your admission symptoms, develop shortness of breath, life threatening emergency, suicidal or homicidal thoughts you must seek medical attention immediately by calling 911 or calling your MD immediately  if symptoms less severe.   You must read complete instructions/literature along with all the possible adverse reactions/side effects for all the Medicines you take and that have been prescribed to you. Take any new Medicines after you have completely understood and accpet all the possible adverse reactions/side effects.    Do not drive when taking Pain medications or sleeping medications (Benzodaizepines)  Do not take more than prescribed Pain, Sleep and Anxiety Medications. It is not advisable to combine anxiety,sleep and pain medications without talking with your primary care practitioner   Special Instructions: If you have smoked or chewed Tobacco  in the last 2 yrs please stop smoking, stop any regular Alcohol  and or any Recreational drug use.   Wear Seat belts while driving.   Please note: You were cared for by a hospitalist during your hospital stay. Once you are discharged, your primary care physician will handle any further medical issues. Please note that NO REFILLS for any discharge medications will be authorized once you are discharged, as it is imperative that you return to your primary care physician (or establish a relationship with a primary care physician if you do not have one) for your post hospital discharge needs so that they can reassess your need for medications and monitor your lab values.  Time coordinating discharge: 20  minutes  SIGNED:  Pamella Pert, MD, PhD 05/23/2023, 10:57 AM

## 2023-05-23 NOTE — TOC Transition Note (Signed)
Transition of Care [facility]) - Discharge Note   Patient Details  Name: Kristin [name] MRN: [id number] Date of Birth: 05-07-1940  [facility]) CM/SW Contact:  Erin Sons, LCSW Phone Number: 05/23/2023, 3:50 PM   Clinical Narrative:     [facility] bed and can admit pt today once consents are signed. RN has called report. Transport forms and DNR are on chart. [facility] liaison will call [facility] once consents are signed.   Final next level of care: Hospice Medical Facility Barriers to Discharge: No Barriers Identified   Patient Goals and CMS Choice            Discharge Placement                       Discharge Plan and Services Additional resources added to the After Visit Summary for                                       Social Drivers of Health (SDOH) Interventions SDOH Screenings   Food Insecurity: Patient Unable To Answer (05/22/2023)  Housing: Low Risk  (05/22/2023)  Transportation Needs: Patient Unable To Answer (05/22/2023)  Utilities: Patient Unable To Answer (05/22/2023)  Depression (PHQ2-9): Low Risk  (03/10/2023)  Financial Resource Strain: Low Risk  (05/13/2023)  Physical Activity: Unknown (05/13/2023)  Social Connections: Patient Unable To Answer (05/22/2023)  Recent Concern: Social Connections - Socially Isolated (05/13/2023)  Stress: Patient Declined (05/13/2023)  Tobacco Use: Low Risk  (05/22/2023)     Readmission Risk Interventions     No data to display

## 2023-05-23 NOTE — Progress Notes (Signed)
Report called and given to [facility], RN at [facility]. Awaiting consents to be completed and transportation to be assigned. Will continue to monitor.

## 2023-05-27 ENCOUNTER — Telehealth: Payer: Self-pay

## 2023-05-28 ENCOUNTER — Encounter: Payer: Self-pay | Admitting: Podiatry

## 2023-05-28 NOTE — Telephone Encounter (Signed)
 Noted thank you

## 2023-05-30 ENCOUNTER — Encounter (INDEPENDENT_AMBULATORY_CARE_PROVIDER_SITE_OTHER): Payer: Medicare PPO | Admitting: Ophthalmology

## 2023-05-31 ENCOUNTER — Other Ambulatory Visit: Payer: Self-pay | Admitting: Physical Medicine and Rehabilitation

## 2023-05-31 DIAGNOSIS — M79604 Pain in right leg: Secondary | ICD-10-CM

## 2023-06-04 ENCOUNTER — Encounter: Payer: Self-pay | Admitting: Nurse Practitioner

## 2023-06-10 ENCOUNTER — Encounter: Payer: Medicare PPO | Admitting: Physical Medicine and Rehabilitation

## 2023-06-11 ENCOUNTER — Telehealth: Payer: Self-pay | Admitting: Adult Health

## 2023-06-11 NOTE — Telephone Encounter (Signed)
 Step daughter called to report that passed on 06/18/2023

## 2023-06-18 ENCOUNTER — Inpatient Hospital Stay: Payer: Medicare PPO | Admitting: Adult Health

## 2023-06-21 NOTE — Telephone Encounter (Addendum)
 Message left on clinical intake voicemail:    Deceased notification, as of today.

## 2023-06-21 DEATH — deceased

## 2023-07-29 ENCOUNTER — Ambulatory Visit: Payer: Medicare PPO | Admitting: Podiatry

## 2023-09-08 ENCOUNTER — Ambulatory Visit: Payer: Medicare PPO | Admitting: Nurse Practitioner

## 2023-09-09 IMAGING — CT CT L SPINE W/O CM
3 of 4 series · 11 of 35 positions shown, 13 images · non-contrast
Comparison: CT abdomen and pelvis and reconstructions of 07/07/2019

CLINICAL DATA: Lumbar radiculopathy, right lower extremity pain

EXAM:
CT LUMBAR SPINE WITHOUT CONTRAST
TECHNIQUE: Multidetector CT imaging of the lumbar spine was performed without
intravenous contrast administration. Multiplanar CT image
reconstructions were also generated.

[Series 7: l spine 2.0 st · axial · 0.52mm/px · z∈[-594,-462]mm · 3 of 116 slices shown, 4 images]
[im 33/116  soft-tissue]
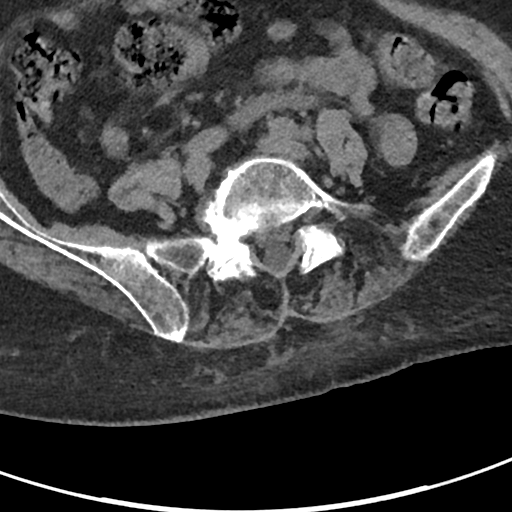
[im 33/116  bone]
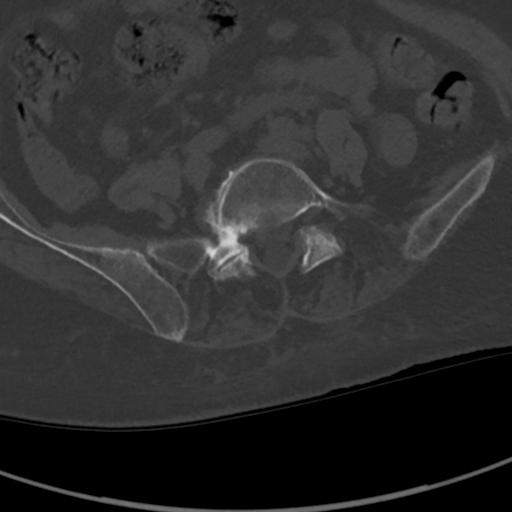
[im 66/116  bone]
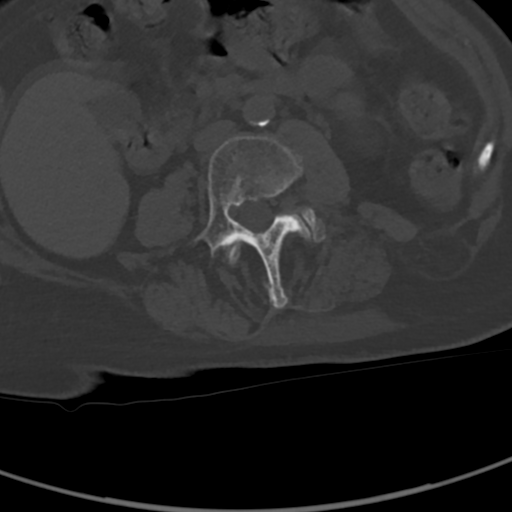
[im 99/116  bone]
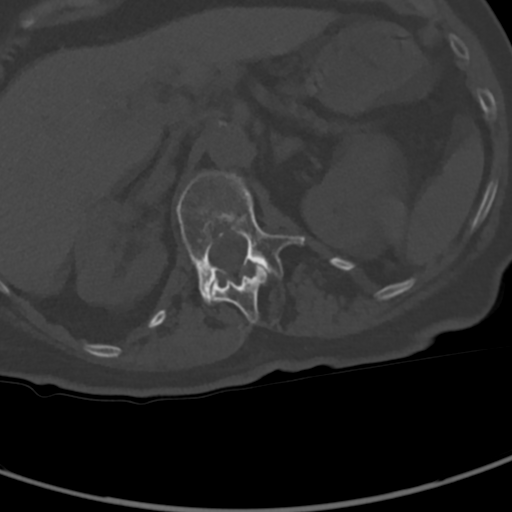

[Series 9: coronal st · coronal · 0.45mm/px · 3 of 68 slices shown]
[im 14/68  bone]
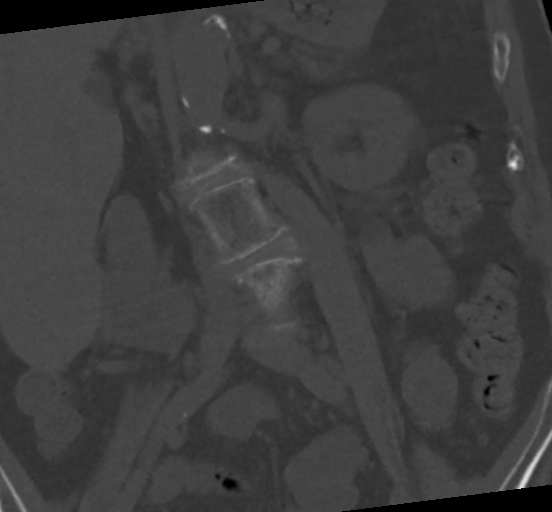
[im 27/68  bone]
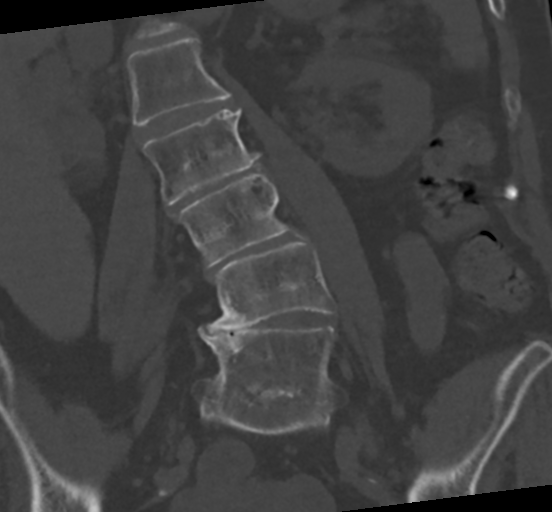
[im 41/68  bone]
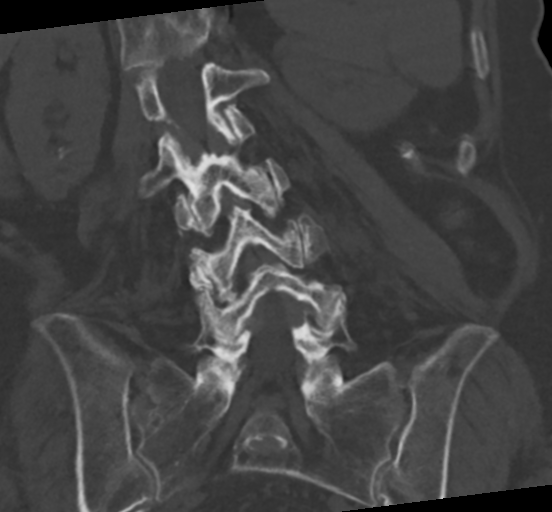

[Series 12: sagittal st · sagittal · 0.27mm/px · 5 of 92 slices shown, 6 images]
[im 31/92  bone]
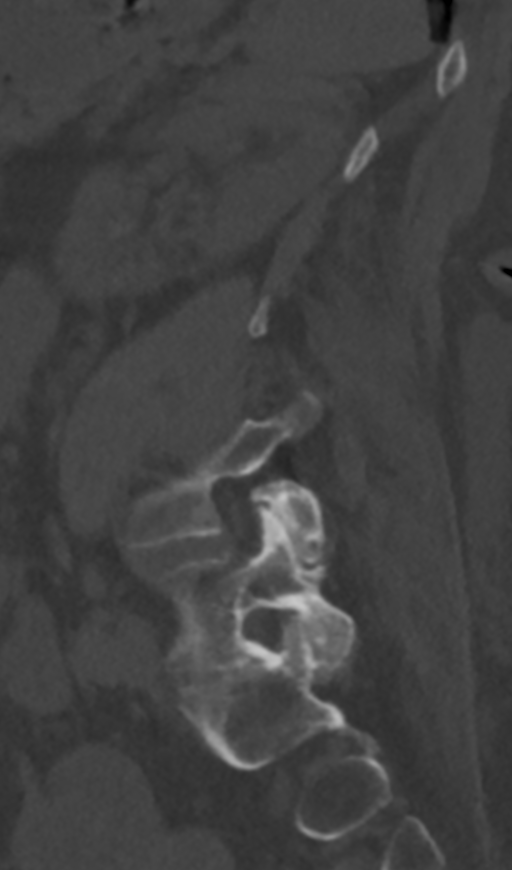
[im 38/92  bone]
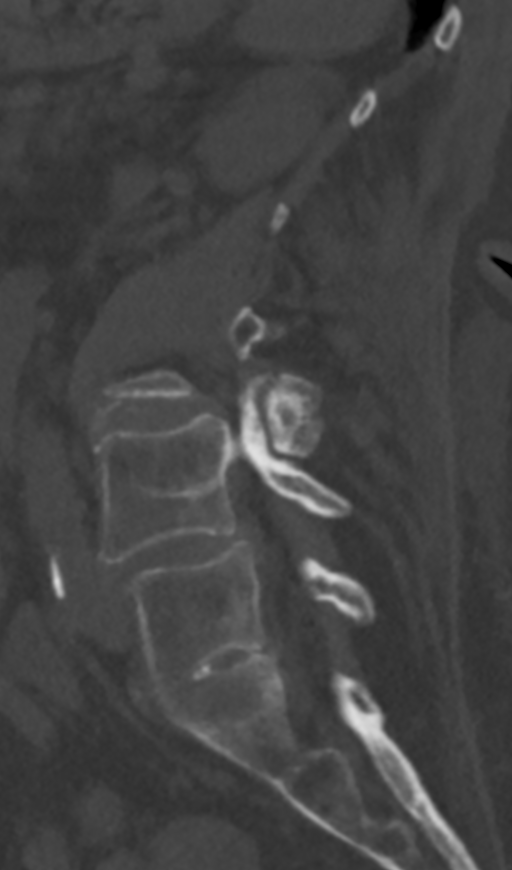
[im 46/92  soft-tissue]
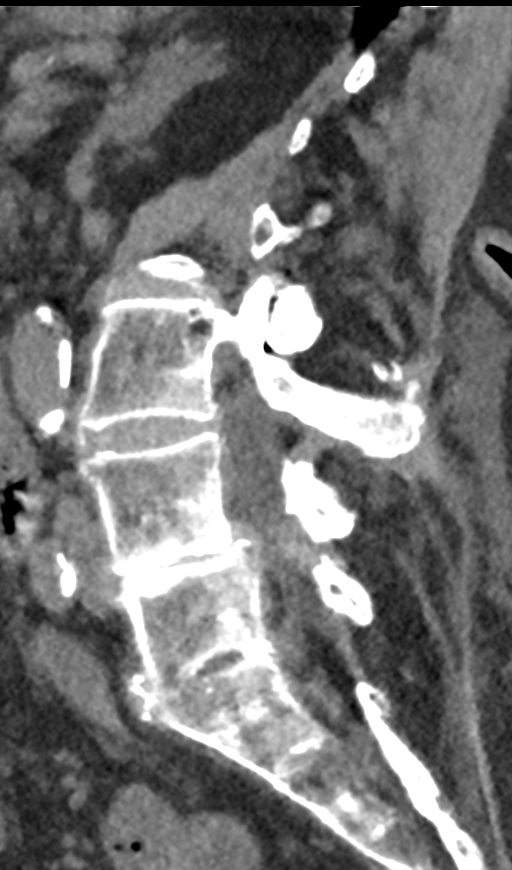
[im 46/92  bone]
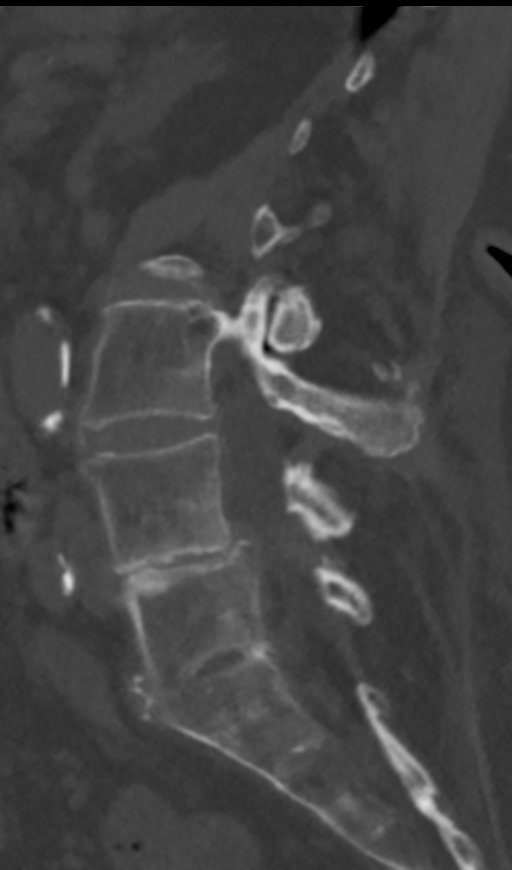
[im 54/92  bone]
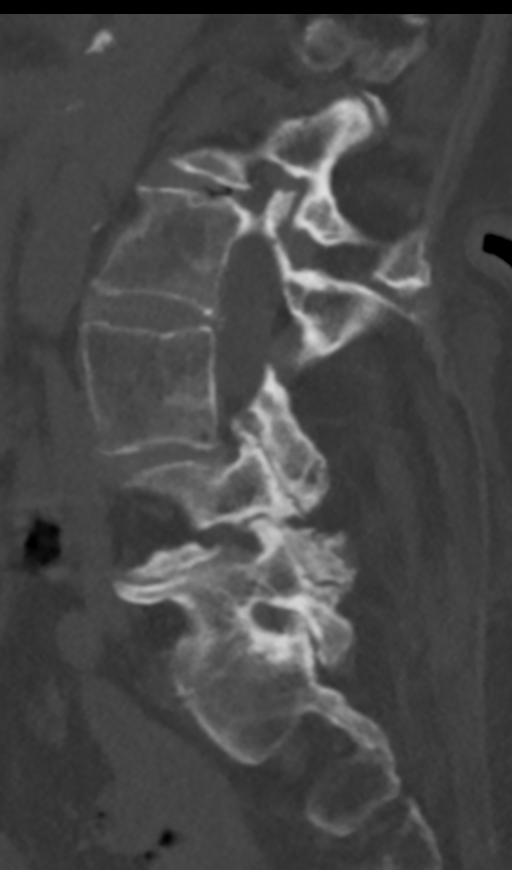
[im 61/92  bone]
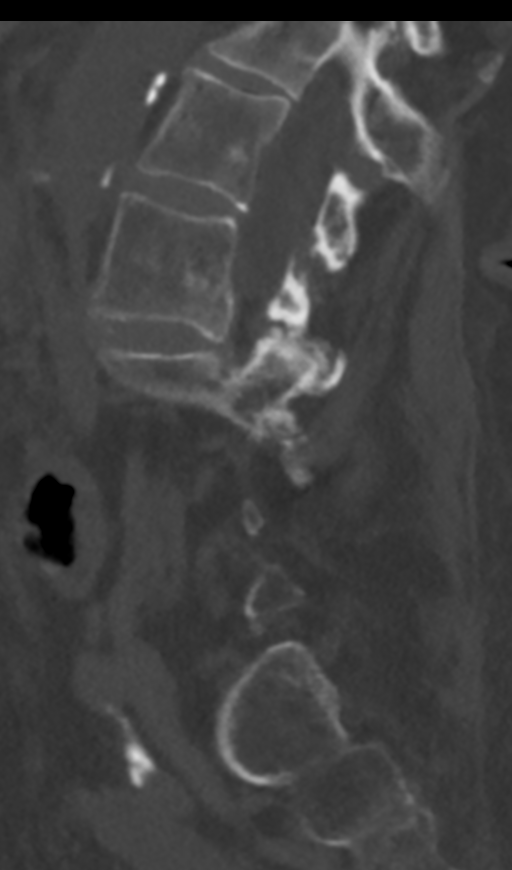

[11 of 35 positions shown; findings below may reference images not displayed]

FINDINGS: Segmentation: 5 lumbar type vertebrae.

Alignment: On the scout image there is a moderate broad-based lower
thoracic to lumbar dextrorotary scoliosis with the apex at T12. In
the lumbar region there is a mild to moderate levocurvature with the
apex at L4-5. There is again noted 8 mm of grade 1 degenerative
anterolisthesis at L4-5 and a slight degenerative anterolisthesis at
L3-4. Findings are unchanged.

Vertebrae: There is osteopenia. Marginal osteophytes are present
most prominently at L2-3 and on the right at L4-5. There is
preservation of the normal vertebral heights without evidence of
fractures. There is bony ankylosis over most of the chronically
collapsed disc space of L5-S1, as before. Visualized SI joints are
patent.

Paraspinal and other soft tissues: There are small stones in
gallbladder without wall thickening. There are multiple
nonobstructing caliceal stones in the right kidney, partial atrophy
of the right renal cortex as before. There is moderate aortoiliac
calcific plaque. No paraspinal fluid collections or masses. No AAA.

Disc levels:

T12-L1 and L1-2: The discs are normal in heights. There is no
herniation or stenosis. There facet spurs on left greater than right
at both levels but the foramina are clear.

L2-3: There is preservation of the normal disc height. There is a
slight posterior disc bulge without herniation or canal stenosis.
There are facet spurs, more so on the right, with bilateral mild
foraminal stenosis.

L3-4: There is mild disc space loss. Circumferential disc bulge is
again noted with shallow left paracentral disc protrusion and
effacement of left greater than right lateral recesses with mild
spinal canal stenosis. There are moderate facet osteophytes on the
right greater than left. There is foraminal disc bulging. Again
noted moderate right foraminal stenosis. Left foramen is clear.

L4-5: There is asymmetric disc collapse again noted towards the
right due to the scoliosis. There are right lateral marginal
osteophytes and discogenic endplate changes. There is posterior disc
bulge and grade 1 degenerative listhesis with advanced facet
hypertrophy right greater than left. There is mild spinal canal
stenosis and partial lateral recess effacement. There is moderate to
severe unilateral right foraminal stenosis.

L5-S1: There is chronic disc collapse and vertebral body ankylosis.
Posterior osteophytic ridging is noted at the level of the collapsed
disc space but without significant mass effect or stenosis. Facet
osteophytes are less prominent at this level, and due to scoliosis
there is moderate unilateral right foraminal stenosis.

Other: None.
IMPRESSION: 1. Osteopenia, scoliosis and chronic degenerative changes.
2. Grade 1 acquired degenerative listhesis at L3-4 and L4-5.
3. L3-4 disc bulge and left paracentral protrusion with left greater
than right lateral recess effacement and mild spinal stenosis,
moderate right foraminal stenosis.
4. Facet joint and endplate osteophytes multiple levels with
foraminal stenosis as above.
5. Nonobstructive stones in the lower pole right kidney and
cholelithiasis.
6. Remaining findings as above.

## 2023-09-09 IMAGING — CT CT CERVICAL SPINE W/O CM
3 series · 13 of 33 positions shown, 16 images · non-contrast
Comparison: None.

CLINICAL DATA: Right arm pain, leg pain.

EXAM:
CT CERVICAL SPINE WITHOUT CONTRAST
TECHNIQUE: Multidetector CT imaging of the cervical spine was performed without
intravenous contrast. Multiplanar CT image reconstructions were also
generated.

[Series 7: c_spine 2.0 st · axial · 0.39mm/px · z∈[-200,-98]mm · 5 of 75 slices shown, 7 images]
[im 12/75  soft-tissue]
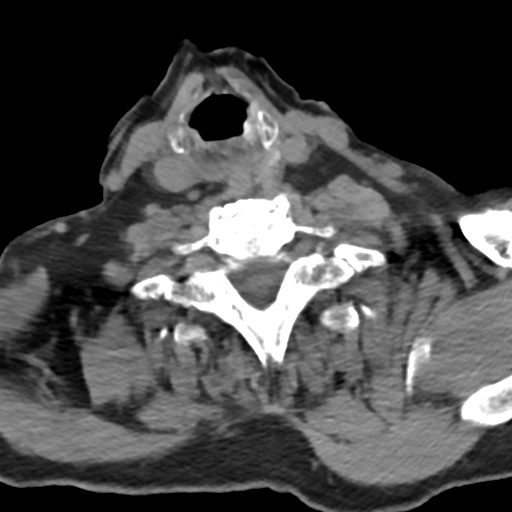
[im 12/75  bone]
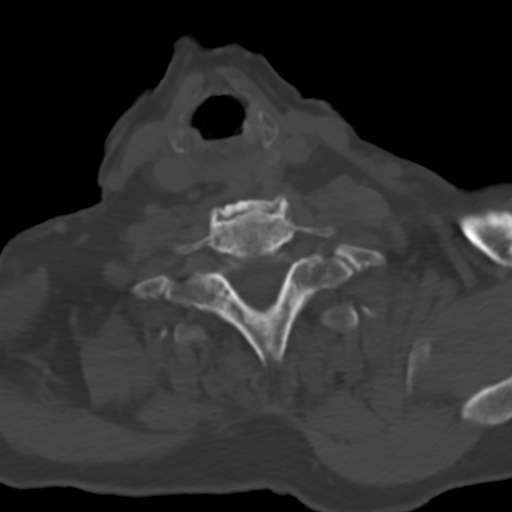
[im 23/75  bone]
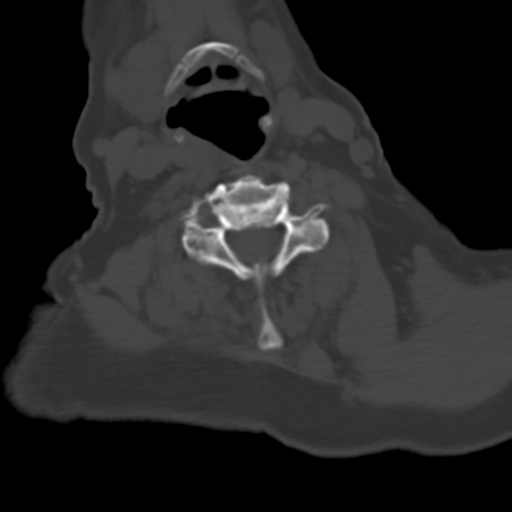
[im 40/75  bone]
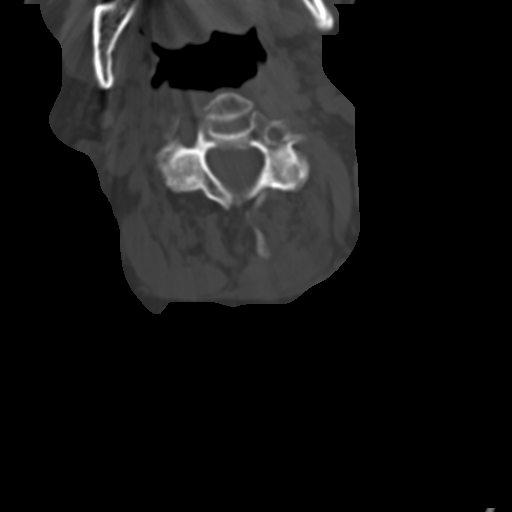
[im 52/75  bone]
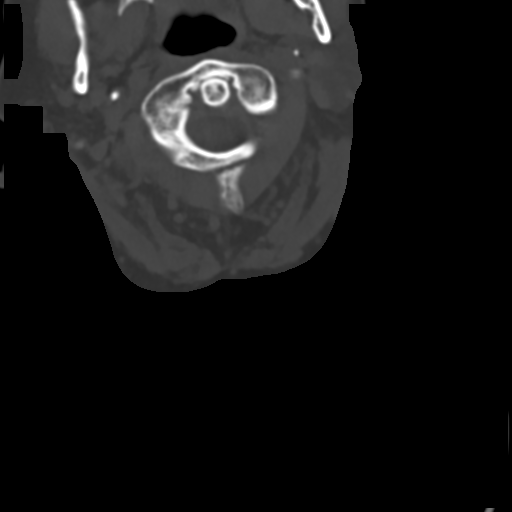
[im 63/75  soft-tissue]
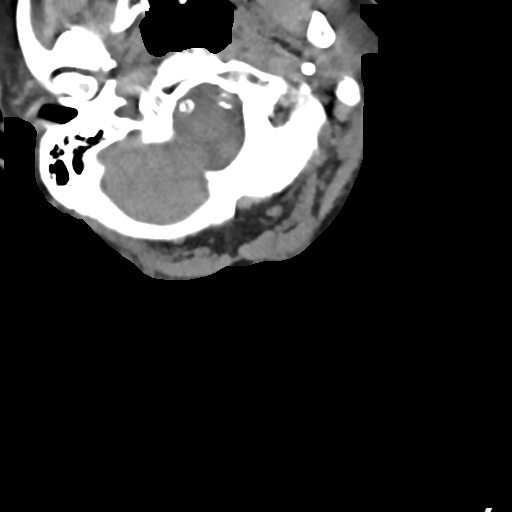
[im 63/75  bone]
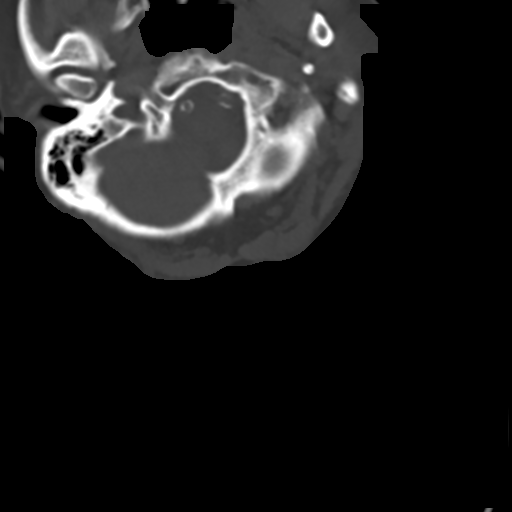

[Series 10: coronal bone · coronal · 0.29mm/px · 3 of 64 slices shown]
[im 19/64  bone]
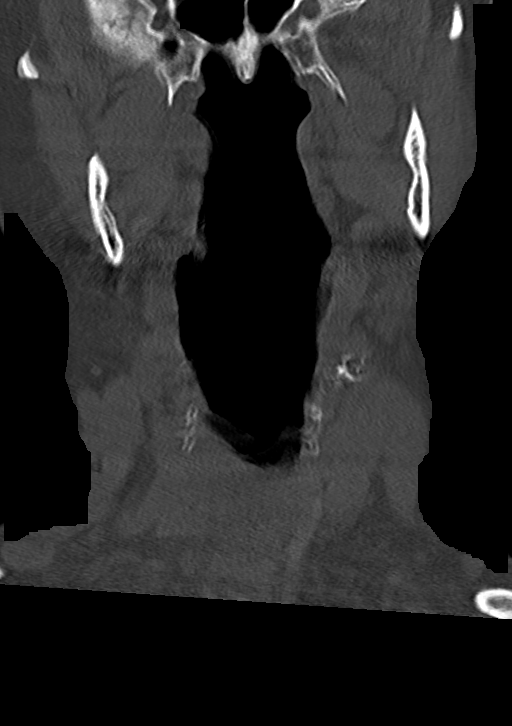
[im 28/64  bone]
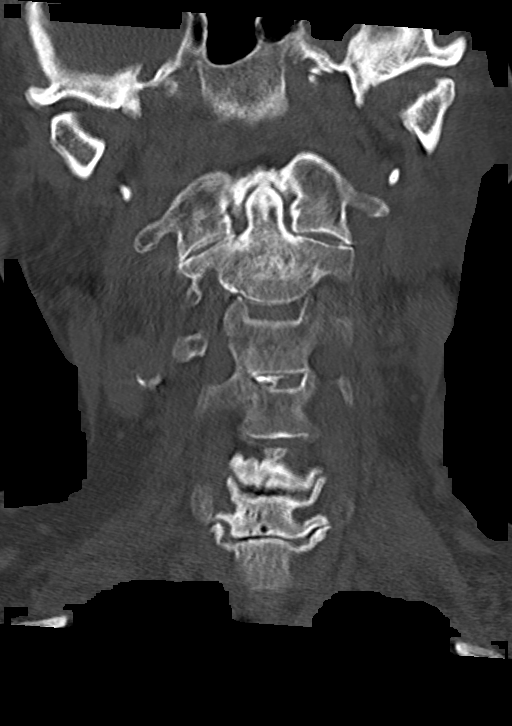
[im 37/64  bone]
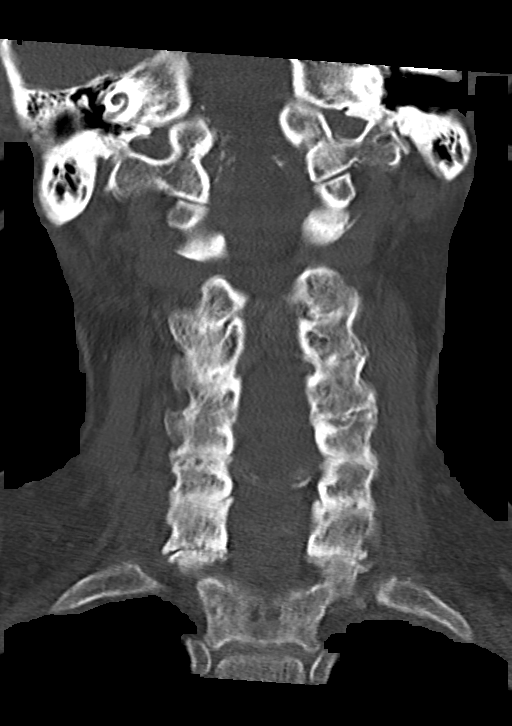

[Series 11: sagittal bone · sagittal · 0.29mm/px · 5 of 58 slices shown, 6 images]
[im 20/58  bone]
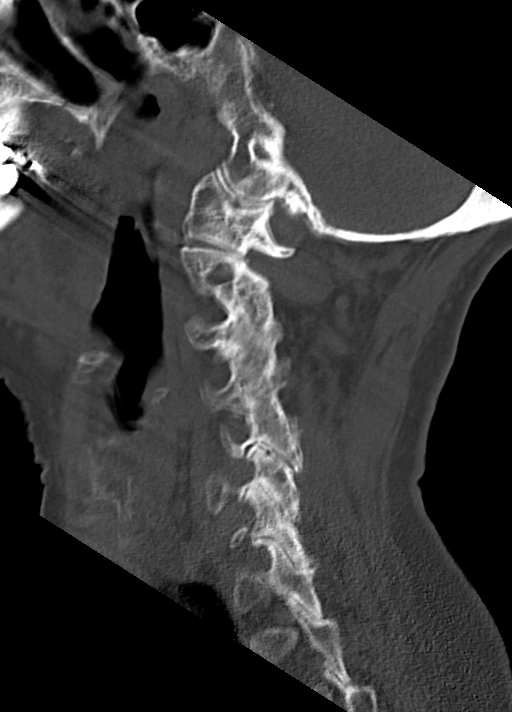
[im 24/58  bone]
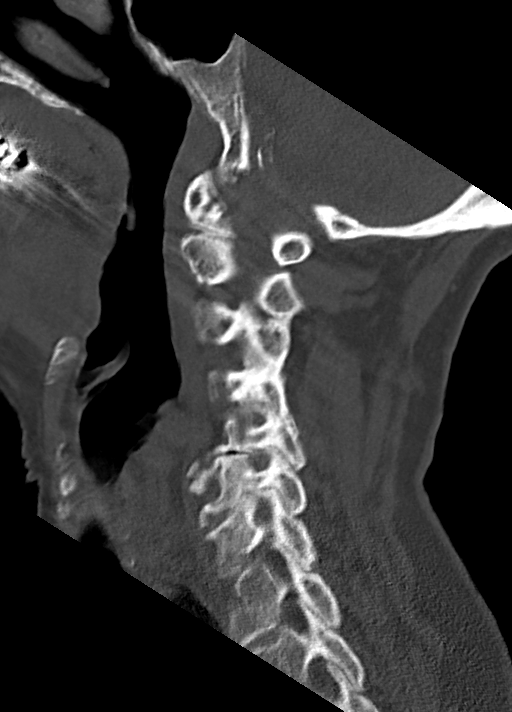
[im 29/58  soft-tissue]
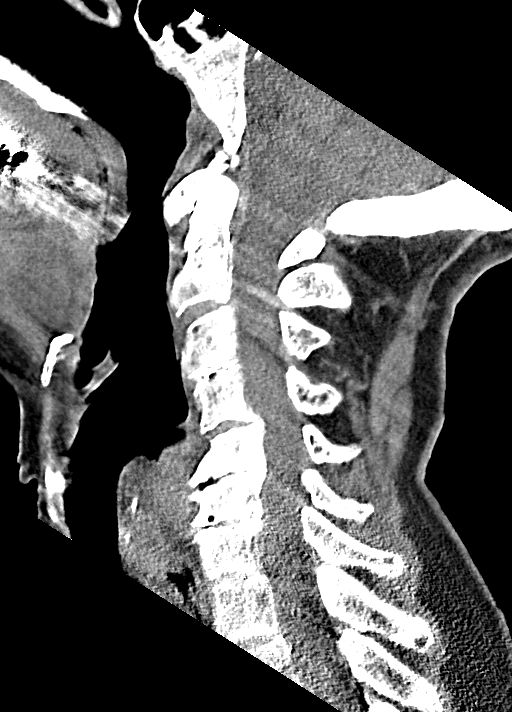
[im 29/58  bone]
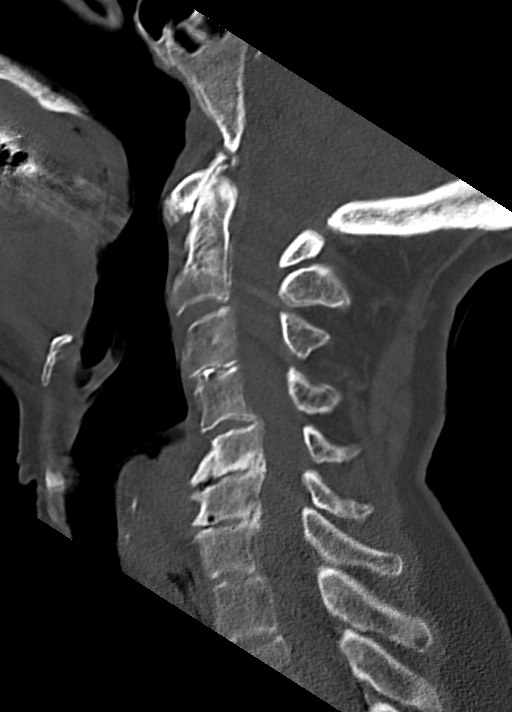
[im 34/58  bone]
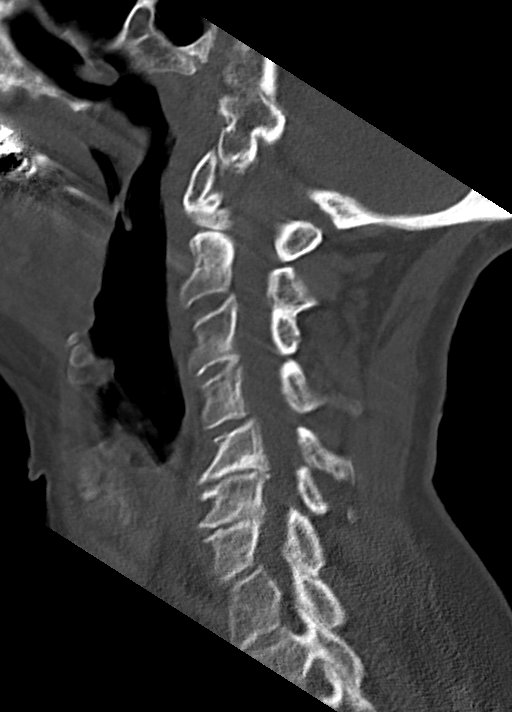
[im 39/58  bone]
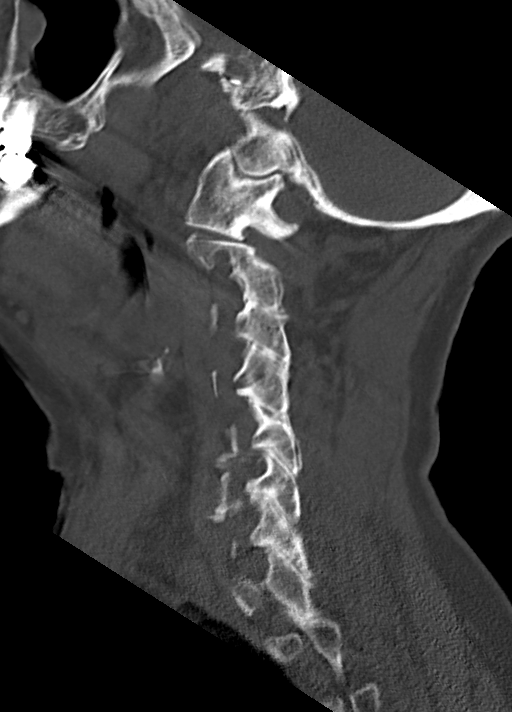

[13 of 33 positions shown; findings below may reference images not displayed]

FINDINGS: Alignment: Slight anterolisthesis of C4 on C5 related to
degenerative facet disease.

Skull base and vertebrae: No acute fracture. No primary bone lesion
or focal pathologic process.

Soft tissues and spinal canal: No prevertebral fluid or swelling. No
visible canal hematoma.

Disc levels: Degenerative disc disease diffusely with disc space
narrowing and spurring anteriorly, most pronounced at C5-6 and C6-7.
Severe diffuse advanced degenerative facet disease. Mild-to-moderate
multilevel bilateral neural foraminal narrowing. No visible disc
herniation.

Upper chest: No acute findings

Other: None
IMPRESSION: Diffuse degenerative disc and facet disease. Multilevel bilateral
neural foraminal narrowing. No acute bony abnormality.

## 2023-09-09 IMAGING — CT CT HEAD W/O CM
3 of 4 series · 14 of 47 positions shown, 16 images · non-contrast
Comparison: 08/23/2013

CLINICAL DATA: Neuro deficit, acute, stroke suspected. Right side
weakness.

EXAM:
CT HEAD WITHOUT CONTRAST
TECHNIQUE: Contiguous axial images were obtained from the base of the skull
through the vertex without intravenous contrast.

[Series 4: head 2.0 h70h · axial · 0.43mm/px · z∈[-91,+37]mm · 8 of 82 slices shown, 10 images]
[im 9/82  brain]
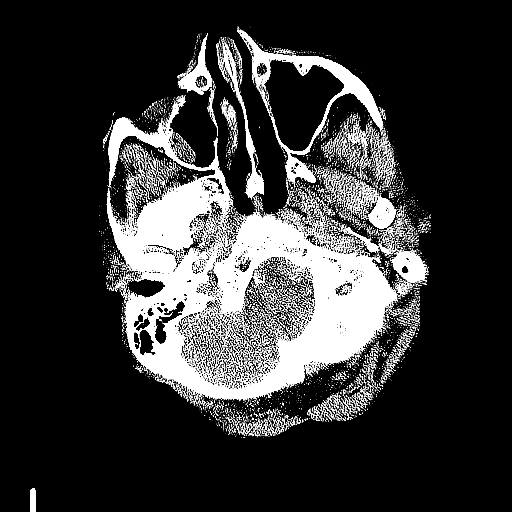
[im 9/82  bone]
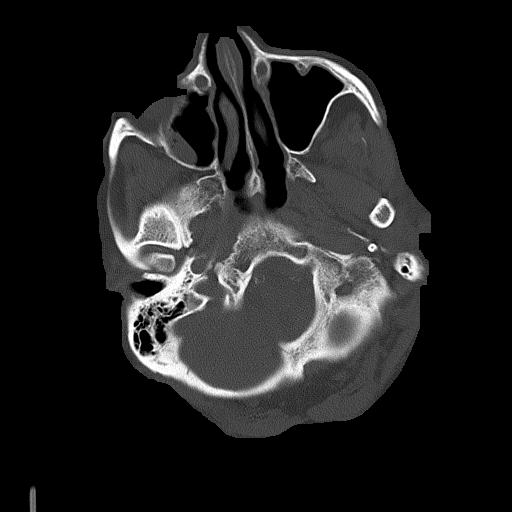
[im 17/82  brain]
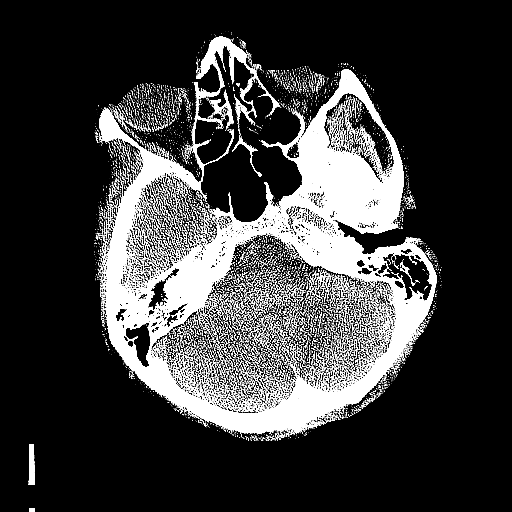
[im 25/82  brain]
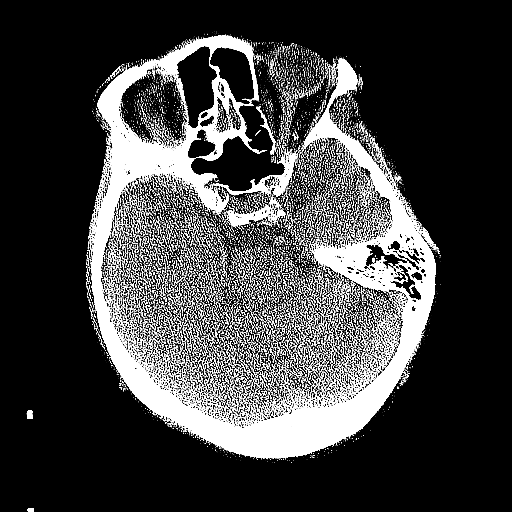
[im 37/82  brain]
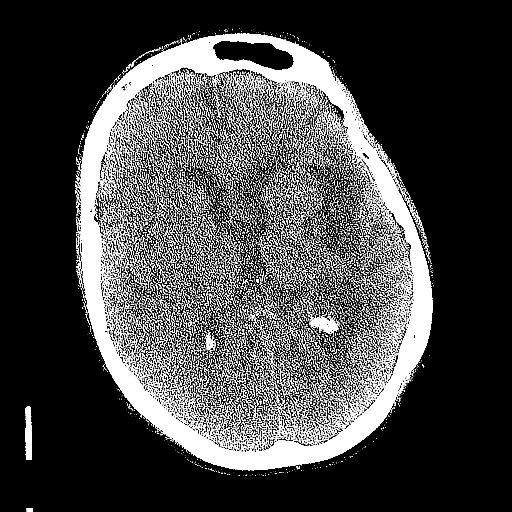
[im 45/82  brain]
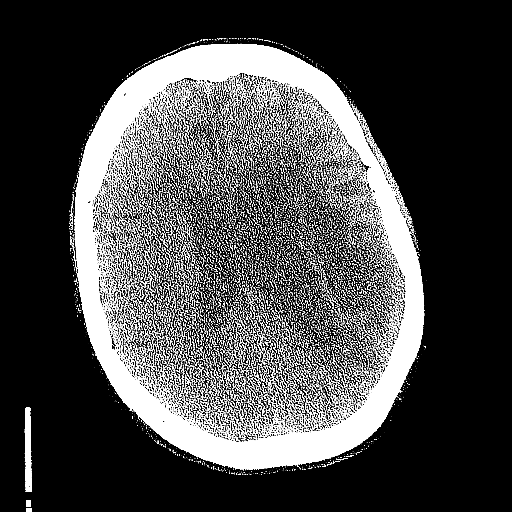
[im 45/82  bone]
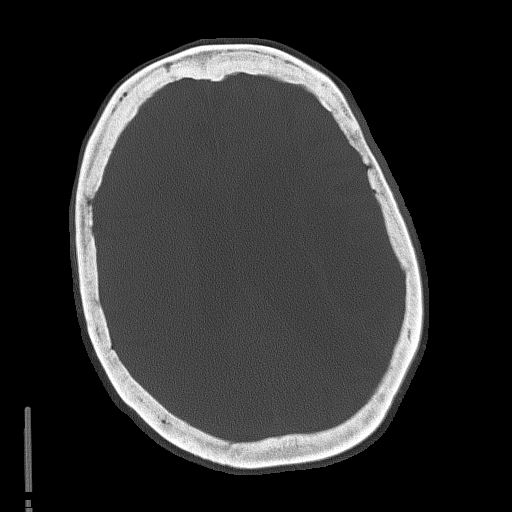
[im 57/82  brain]
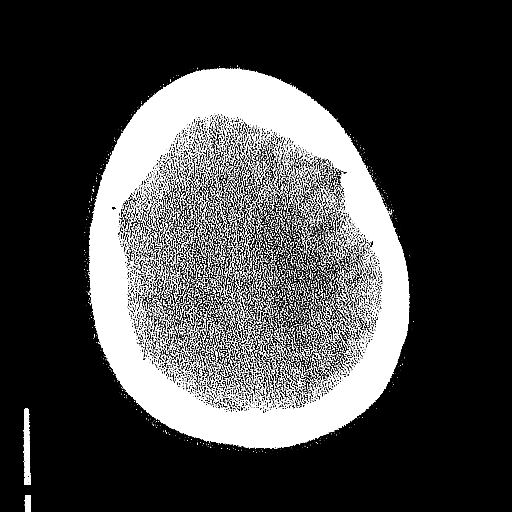
[im 65/82  brain]
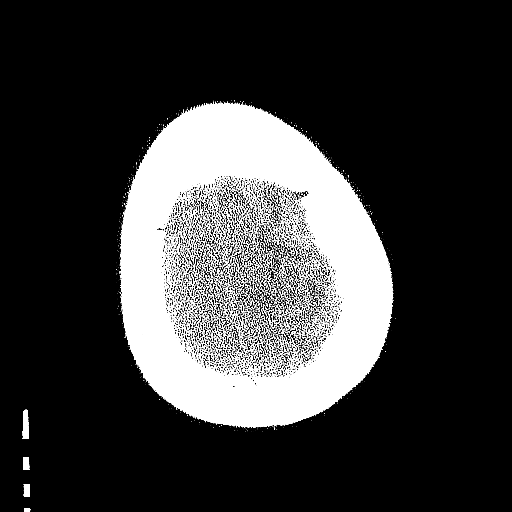
[im 73/82  brain]
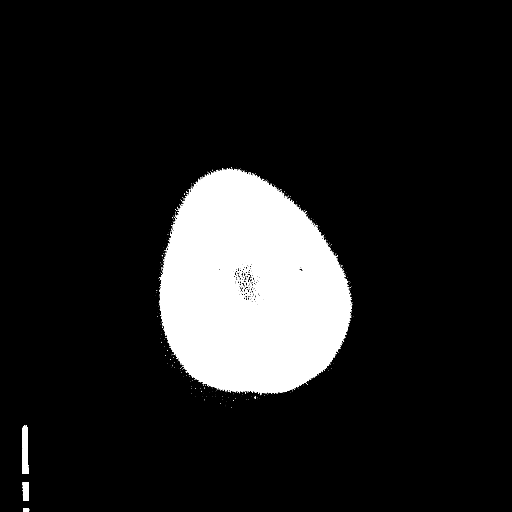

[Series 5: head 3.0 mpr cor · coronal · 0.32mm/px · 3 of 69 slices shown]
[im 23/69  brain]
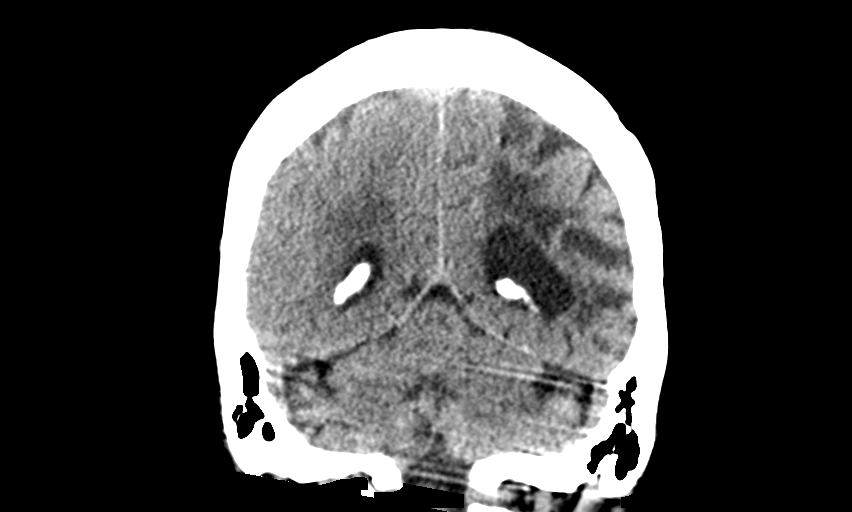
[im 31/69  brain]
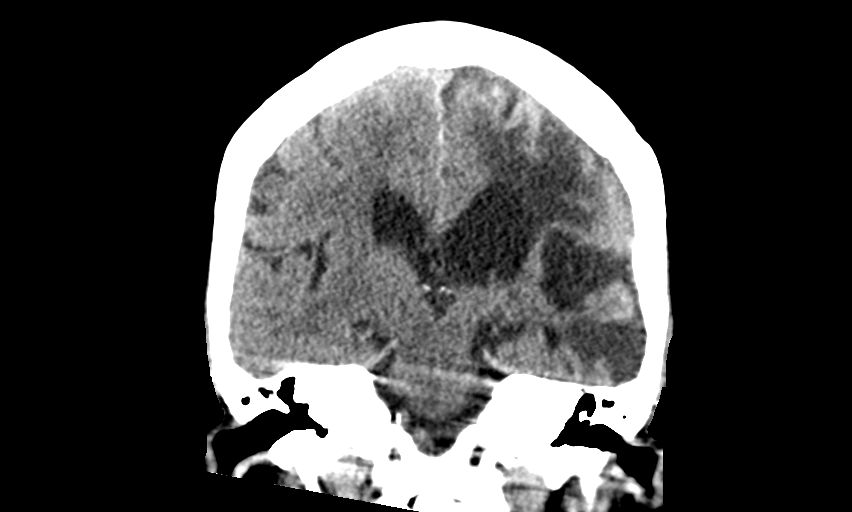
[im 38/69  brain]
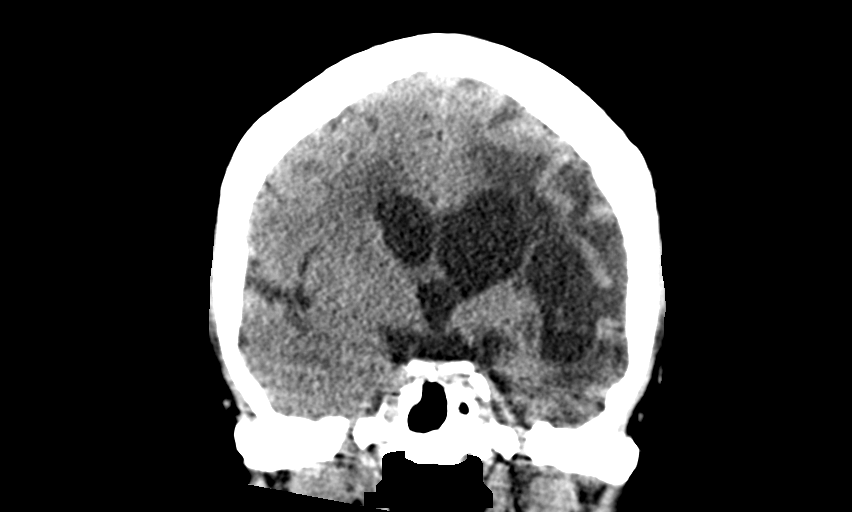

[Series 6: head 3.0 mpr sag · sagittal · 0.32mm/px · 3 of 55 slices shown]
[im 22/55  brain]
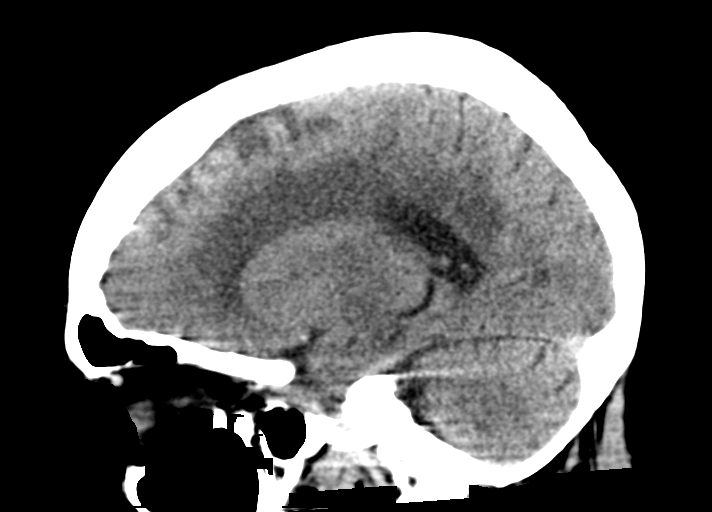
[im 28/55  brain]
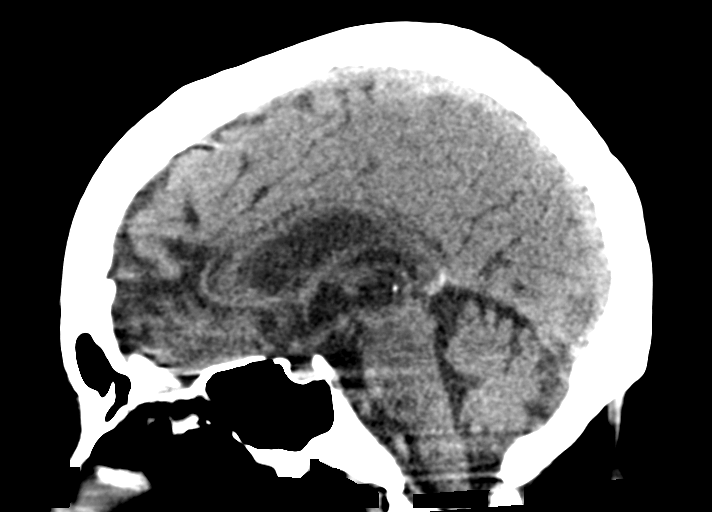
[im 33/55  brain]
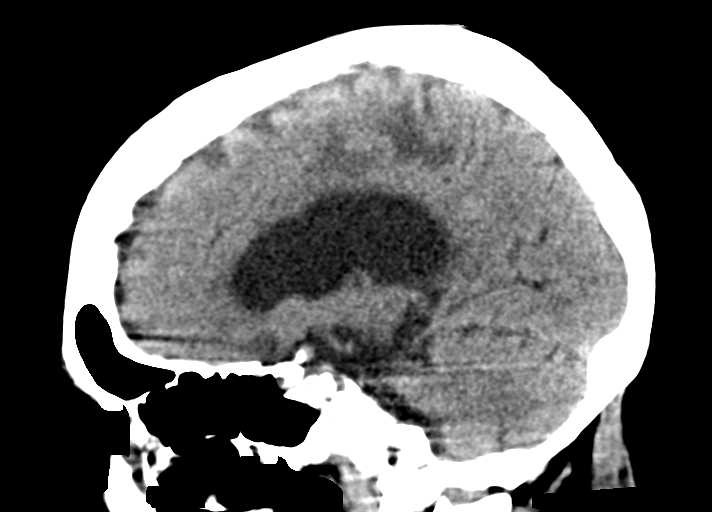

[14 of 47 positions shown; findings below may reference images not displayed]

FINDINGS: Brain: Old left MCA infarct with encephalomalacia, stable. There is
atrophy and chronic small vessel disease changes. No acute
intracranial abnormality. Specifically, no hemorrhage,
hydrocephalus, mass lesion, acute infarction, or significant
intracranial injury.

Vascular: No hyperdense vessel or unexpected calcification.

Skull: No acute calvarial abnormality.

Sinuses/Orbits: No acute findings

Other: None
IMPRESSION: Old left MCA infarct with encephalomalacia.

Atrophy, chronic microvascular disease.

No acute intracranial abnormality.

## 2023-10-29 ENCOUNTER — Ambulatory Visit: Payer: Medicare PPO | Admitting: Podiatry
